# Patient Record
Sex: Male | Born: 1945 | Race: White | Hispanic: No | Marital: Married | Smoking: Former smoker
Health system: Southern US, Community
[De-identification: ages and names within clinical notes are randomized; demographics above are authoritative.]

## PROBLEM LIST (undated history)

## (undated) DIAGNOSIS — C4491 Basal cell carcinoma of skin, unspecified: Secondary | ICD-10-CM

## (undated) DIAGNOSIS — E039 Hypothyroidism, unspecified: Secondary | ICD-10-CM

## (undated) DIAGNOSIS — Z8739 Personal history of other diseases of the musculoskeletal system and connective tissue: Secondary | ICD-10-CM

## (undated) DIAGNOSIS — I219 Acute myocardial infarction, unspecified: Secondary | ICD-10-CM

## (undated) DIAGNOSIS — M310 Hypersensitivity angiitis: Secondary | ICD-10-CM

## (undated) DIAGNOSIS — I10 Essential (primary) hypertension: Secondary | ICD-10-CM

## (undated) DIAGNOSIS — E785 Hyperlipidemia, unspecified: Secondary | ICD-10-CM

## (undated) DIAGNOSIS — M545 Low back pain, unspecified: Secondary | ICD-10-CM

## (undated) DIAGNOSIS — E119 Type 2 diabetes mellitus without complications: Secondary | ICD-10-CM

## (undated) DIAGNOSIS — M48061 Spinal stenosis, lumbar region without neurogenic claudication: Secondary | ICD-10-CM

## (undated) DIAGNOSIS — F419 Anxiety disorder, unspecified: Secondary | ICD-10-CM

## (undated) DIAGNOSIS — I82409 Acute embolism and thrombosis of unspecified deep veins of unspecified lower extremity: Secondary | ICD-10-CM

## (undated) DIAGNOSIS — C140 Malignant neoplasm of pharynx, unspecified: Secondary | ICD-10-CM

## (undated) DIAGNOSIS — G4730 Sleep apnea, unspecified: Secondary | ICD-10-CM

## (undated) DIAGNOSIS — F32A Depression, unspecified: Secondary | ICD-10-CM

## (undated) DIAGNOSIS — R569 Unspecified convulsions: Secondary | ICD-10-CM

## (undated) DIAGNOSIS — R918 Other nonspecific abnormal finding of lung field: Secondary | ICD-10-CM

## (undated) DIAGNOSIS — M1990 Unspecified osteoarthritis, unspecified site: Secondary | ICD-10-CM

## (undated) DIAGNOSIS — J690 Pneumonitis due to inhalation of food and vomit: Secondary | ICD-10-CM

## (undated) DIAGNOSIS — F329 Major depressive disorder, single episode, unspecified: Secondary | ICD-10-CM

## (undated) DIAGNOSIS — I2510 Atherosclerotic heart disease of native coronary artery without angina pectoris: Secondary | ICD-10-CM

## (undated) DIAGNOSIS — Z9981 Dependence on supplemental oxygen: Secondary | ICD-10-CM

## (undated) DIAGNOSIS — D509 Iron deficiency anemia, unspecified: Secondary | ICD-10-CM

## (undated) DIAGNOSIS — G20 Parkinson's disease: Secondary | ICD-10-CM

## (undated) DIAGNOSIS — I509 Heart failure, unspecified: Secondary | ICD-10-CM

## (undated) DIAGNOSIS — G20A1 Parkinson's disease without dyskinesia, without mention of fluctuations: Secondary | ICD-10-CM

## (undated) DIAGNOSIS — K2270 Barrett's esophagus without dysplasia: Secondary | ICD-10-CM

## (undated) DIAGNOSIS — J449 Chronic obstructive pulmonary disease, unspecified: Secondary | ICD-10-CM

## (undated) DIAGNOSIS — G8929 Other chronic pain: Secondary | ICD-10-CM

## (undated) DIAGNOSIS — K219 Gastro-esophageal reflux disease without esophagitis: Secondary | ICD-10-CM

## (undated) HISTORY — PX: CORONARY ANGIOPLASTY WITH STENT PLACEMENT: SHX49

## (undated) HISTORY — DX: Major depressive disorder, single episode, unspecified: F32.9

## (undated) HISTORY — DX: Heart failure, unspecified: I50.9

## (undated) HISTORY — DX: Spinal stenosis, lumbar region without neurogenic claudication: M48.061

## (undated) HISTORY — PX: BACK SURGERY: SHX140

## (undated) HISTORY — DX: Chronic obstructive pulmonary disease, unspecified: J44.9

## (undated) HISTORY — DX: Barrett's esophagus without dysplasia: K22.70

## (undated) HISTORY — PX: ANTERIOR CERVICAL DECOMP/DISCECTOMY FUSION: SHX1161

## (undated) HISTORY — DX: Hyperlipidemia, unspecified: E78.5

## (undated) HISTORY — PX: JOINT REPLACEMENT: SHX530

## (undated) HISTORY — DX: Depression, unspecified: F32.A

## (undated) HISTORY — DX: Essential (primary) hypertension: I10

## (undated) HISTORY — DX: Atherosclerotic heart disease of native coronary artery without angina pectoris: I25.10

## (undated) HISTORY — PX: TONSILLECTOMY: SUR1361

## (undated) HISTORY — DX: Acute myocardial infarction, unspecified: I21.9

## (undated) HISTORY — DX: Other nonspecific abnormal finding of lung field: R91.8

## (undated) HISTORY — PX: CATARACT EXTRACTION W/ INTRAOCULAR LENS  IMPLANT, BILATERAL: SHX1307

## (undated) HISTORY — PX: BASAL CELL CARCINOMA EXCISION: SHX1214

## (undated) HISTORY — PX: EXCISIONAL HEMORRHOIDECTOMY: SHX1541

## (undated) HISTORY — PX: POSTERIOR FUSION CERVICAL SPINE: SUR628

## (undated) HISTORY — PX: RADICAL NECK DISSECTION: SHX2284

## (undated) HISTORY — DX: Basal cell carcinoma of skin, unspecified: C44.91

## (undated) HISTORY — PX: THYROIDECTOMY: SHX17

---

## 1994-07-07 HISTORY — PX: CORONARY ARTERY BYPASS GRAFT: SHX141

## 1995-03-20 DIAGNOSIS — I219 Acute myocardial infarction, unspecified: Secondary | ICD-10-CM

## 1995-03-20 HISTORY — DX: Acute myocardial infarction, unspecified: I21.9

## 2001-12-14 ENCOUNTER — Encounter: Payer: Self-pay | Admitting: Neurosurgery

## 2001-12-14 ENCOUNTER — Encounter: Admission: RE | Admit: 2001-12-14 | Discharge: 2001-12-14 | Payer: Self-pay | Admitting: Neurosurgery

## 2002-01-21 ENCOUNTER — Encounter: Payer: Self-pay | Admitting: Neurosurgery

## 2002-01-27 ENCOUNTER — Encounter: Payer: Self-pay | Admitting: Neurosurgery

## 2002-01-28 ENCOUNTER — Inpatient Hospital Stay (HOSPITAL_COMMUNITY): Admission: AD | Admit: 2002-01-28 | Discharge: 2002-02-01 | Payer: Self-pay | Admitting: Neurosurgery

## 2005-05-26 ENCOUNTER — Ambulatory Visit: Payer: Self-pay | Admitting: Internal Medicine

## 2005-12-30 ENCOUNTER — Ambulatory Visit: Payer: Self-pay | Admitting: Internal Medicine

## 2006-01-21 ENCOUNTER — Ambulatory Visit (HOSPITAL_COMMUNITY): Admission: RE | Admit: 2006-01-21 | Discharge: 2006-01-21 | Payer: Self-pay | Admitting: Neurosurgery

## 2006-02-05 ENCOUNTER — Ambulatory Visit (HOSPITAL_COMMUNITY): Admission: RE | Admit: 2006-02-05 | Discharge: 2006-02-06 | Payer: Self-pay | Admitting: Neurosurgery

## 2006-06-24 ENCOUNTER — Ambulatory Visit: Payer: Self-pay | Admitting: Otolaryngology

## 2006-09-02 ENCOUNTER — Ambulatory Visit: Payer: Self-pay | Admitting: Otolaryngology

## 2007-01-19 ENCOUNTER — Ambulatory Visit: Payer: Self-pay | Admitting: Internal Medicine

## 2007-01-20 ENCOUNTER — Ambulatory Visit: Payer: Self-pay | Admitting: Internal Medicine

## 2007-06-09 ENCOUNTER — Ambulatory Visit: Payer: Self-pay | Admitting: Internal Medicine

## 2007-06-16 ENCOUNTER — Ambulatory Visit: Payer: Self-pay | Admitting: Gastroenterology

## 2007-06-23 ENCOUNTER — Ambulatory Visit: Payer: Self-pay | Admitting: Gastroenterology

## 2007-06-28 ENCOUNTER — Ambulatory Visit: Payer: Self-pay | Admitting: Gastroenterology

## 2008-01-29 ENCOUNTER — Ambulatory Visit: Payer: Self-pay | Admitting: Internal Medicine

## 2009-05-06 ENCOUNTER — Inpatient Hospital Stay: Payer: Self-pay | Admitting: Internal Medicine

## 2009-07-07 HISTORY — PX: TOTAL KNEE ARTHROPLASTY: SHX125

## 2009-07-10 ENCOUNTER — Ambulatory Visit: Payer: Self-pay | Admitting: Ophthalmology

## 2009-12-24 ENCOUNTER — Ambulatory Visit: Payer: Self-pay | Admitting: General Practice

## 2009-12-26 ENCOUNTER — Ambulatory Visit: Payer: Self-pay | Admitting: Cardiovascular Disease

## 2010-01-09 ENCOUNTER — Inpatient Hospital Stay: Payer: Self-pay | Admitting: General Practice

## 2011-02-06 ENCOUNTER — Ambulatory Visit: Payer: Self-pay | Admitting: Otolaryngology

## 2011-02-19 ENCOUNTER — Ambulatory Visit: Payer: Self-pay | Admitting: Specialist

## 2011-11-26 ENCOUNTER — Ambulatory Visit: Payer: Self-pay | Admitting: Ophthalmology

## 2011-11-26 DIAGNOSIS — I10 Essential (primary) hypertension: Secondary | ICD-10-CM

## 2011-12-09 ENCOUNTER — Ambulatory Visit: Payer: Self-pay | Admitting: Ophthalmology

## 2012-01-01 ENCOUNTER — Ambulatory Visit: Payer: Self-pay | Admitting: Cardiothoracic Surgery

## 2012-01-02 ENCOUNTER — Telehealth: Payer: Self-pay | Admitting: Pulmonary Disease

## 2012-01-02 NOTE — Telephone Encounter (Signed)
Forward to Dr. Kendrick Fries for review on 01-02-12 ym

## 2012-01-05 ENCOUNTER — Ambulatory Visit: Payer: Self-pay | Admitting: Cardiothoracic Surgery

## 2012-01-14 ENCOUNTER — Encounter: Payer: Self-pay | Admitting: *Deleted

## 2012-01-14 ENCOUNTER — Ambulatory Visit: Payer: Self-pay | Admitting: Gastroenterology

## 2012-01-15 ENCOUNTER — Encounter: Payer: Self-pay | Admitting: Pulmonary Disease

## 2012-01-15 ENCOUNTER — Ambulatory Visit (INDEPENDENT_AMBULATORY_CARE_PROVIDER_SITE_OTHER): Payer: Medicare Other | Admitting: Pulmonary Disease

## 2012-01-15 VITALS — BP 162/90 | HR 110 | Temp 97.8°F | Ht 70.0 in | Wt 192.4 lb

## 2012-01-15 DIAGNOSIS — J449 Chronic obstructive pulmonary disease, unspecified: Secondary | ICD-10-CM

## 2012-01-15 DIAGNOSIS — J690 Pneumonitis due to inhalation of food and vomit: Secondary | ICD-10-CM | POA: Insufficient documentation

## 2012-01-15 DIAGNOSIS — K2270 Barrett's esophagus without dysplasia: Secondary | ICD-10-CM

## 2012-01-15 DIAGNOSIS — I2510 Atherosclerotic heart disease of native coronary artery without angina pectoris: Secondary | ICD-10-CM

## 2012-01-15 DIAGNOSIS — T17908A Unspecified foreign body in respiratory tract, part unspecified causing other injury, initial encounter: Secondary | ICD-10-CM

## 2012-01-15 DIAGNOSIS — R918 Other nonspecific abnormal finding of lung field: Secondary | ICD-10-CM

## 2012-01-15 DIAGNOSIS — J4489 Other specified chronic obstructive pulmonary disease: Secondary | ICD-10-CM

## 2012-01-15 DIAGNOSIS — I10 Essential (primary) hypertension: Secondary | ICD-10-CM

## 2012-01-15 MED ORDER — TIOTROPIUM BROMIDE MONOHYDRATE 18 MCG IN CAPS: 18.0000 ug | ORAL_CAPSULE | Freq: Every day | RESPIRATORY_TRACT | Status: DC

## 2012-01-15 NOTE — Progress Notes (Signed)
Subjective:    Patient ID: John Mendoza, male    DOB: [birth year], 66 y.o.   MRN: [id number]  HPI John Mendoza is a very pleasant 66 year old male with a past medical history significant for throat cancer and COPD who presented to our office today for evaluation of shortness of breath and pulmonary nodules. He smoked 2 packs per day for 35 years and quit in 1996. At that point he had a three-vessel coronary artery bypass surgery performed at [facility] In 1999 he developed throat cancer and underwent a radical neck dissection and 36 days of radiation therapy by the [facility]. Since his radiation therapy he has had trouble swallowing that has progressed over the years. 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 He is to start participating in speech therapy for swallowing exercises. He has had at least one episode of pneumonia over the years which she believes is due to aspiration. In terms of respiratory symptoms he says that he gets quite short of breath with minimal walking around his house. He states that sometimes just walking 20 or 30 feet will make him winded and he nearly always gets winded while gardening. He states that he gets short of breath when he bends over and he complains of cough productive of clear to yellow sputum on a daily basis. His cough is always worse after eating.  He sometimes has chest tightness which radiates to his jaw and left arm when he is short of breath. Of note he had a negative stress test performed by Dr. Lady Mendoza 3 weeks ago. He states that in the past he took inhalers (he believes albuterol and perhaps Advair) and feels that they caused depression symptoms.  Past Medical History  Diagnosis Date  . Barrett esophagus   . Hypertension   . Hyperlipidemia   . Depression   . Multiple pulmonary nodules   . CHF (congestive  heart failure)   . Lumbar spinal stenosis   . S/P CABG (coronary artery bypass graft)   . COPD (chronic obstructive pulmonary disease)   . CAD (coronary artery disease)   . Basal cell carcinoma   . Diabetes mellitus   . Cancer of neck   . Heart attack      Family History  Problem Relation Age of Onset  . Leukemia Paternal Grandmother   . Cancer Maternal Grandmother     stomach  . Cancer Paternal Aunt   . Other Father     bacterial endocarditis  . Rheum arthritis Father      History   Social History  . Marital Status: Married    Spouse Name: N/A    Number of Children: 2  . Years of Education: N/A   Occupational History  . Retired     has worked in [employer] in the past, exposed to lint.    Social History Main Topics  . Smoking status: Former Smoker -- 2.0 packs/day for 35 years    Types: Cigarettes    Quit date: 07/07/1994  . Smokeless tobacco: Never Used  . Alcohol Use: No  . Drug Use: No  . Sexually Active: Not on file   Other Topics Concern  . Not on file   Social History Narrative  . No narrative on file     Allergies  Allergen Reactions  . Other     Pt states that inhaled meds make  him depressed and have negative thoughts     Outpatient Prescriptions Prior to Visit  Medication Sig Dispense Refill  . amLODipine (NORVASC) 10 MG tablet Take 10 mg by mouth daily.      Marland Kitchen aspirin 325 MG tablet Take 325 mg by mouth daily.      . bisoprolol (ZEBETA) 10 MG tablet Take 10 mg by mouth daily.      Marland Kitchen CLONAZEPAM PO Take by mouth. 1.5 mg at night and 0.75mg  in the morning      . levothyroxine (SYNTHROID, LEVOTHROID) 125 MCG tablet Take 125 mcg by mouth daily.      Marland Kitchen lisinopril (PRINIVIL,ZESTRIL) 40 MG tablet Take 40 mg by mouth daily.      . metFORMIN (GLUCOPHAGE) 1000 MG tablet Take 1,000 mg by mouth daily.      Marland Kitchen omeprazole (PRILOSEC) 40 MG capsule Take 40 mg by mouth 2 (two) times daily.      Marland Kitchen PARoxetine (PAXIL) 20 MG tablet Take 20 mg by mouth 3 (three)  times daily.      . simvastatin (ZOCOR) 80 MG tablet Take 80 mg by mouth at bedtime.           Review of Systems  Constitutional: Positive for appetite change. Negative for fever, chills and activity change.  HENT: Positive for trouble swallowing. Negative for hearing loss, ear pain, congestion, rhinorrhea, sneezing, neck pain, neck stiffness, postnasal drip and sinus pressure.   Eyes: Negative for redness, itching and visual disturbance.  Respiratory: Positive for cough and shortness of breath. Negative for chest tightness and wheezing.   Cardiovascular: Positive for chest pain. Negative for palpitations and leg swelling.  Gastrointestinal: Positive for abdominal pain. Negative for nausea, vomiting, diarrhea, constipation, blood in stool and abdominal distention.  Musculoskeletal: Negative for myalgias, joint swelling, arthralgias and gait problem.  Skin: Negative for rash.  Neurological: Negative for dizziness, light-headedness, numbness and headaches.  Hematological: Does not bruise/bleed easily.  Psychiatric/Behavioral: Negative for confusion and dysphoric mood.       Objective:   Physical Exam Filed Vitals:   01/15/12 1450  BP: 162/90  Pulse: 110  Temp: 97.8 F (36.6 C)  TempSrc: Oral  Height: 5\' 10"  (1.778 m)  Weight: 192 lb 6.4 oz (87.272 kg)  SpO2: 96%   Gen: well appearing, no acute distress HEENT: NCAT, PERRL, EOMi, OP clear with dry mucus membranes, s/p radical neck dissection on right,  PULM: CTA B CV: RRR, no mgr, no JVD AB: BS+, soft, nontender, no hsm Ext: warm, trace edema, no clubbing, no cyanosis Derm: no rash or skin breakdown Neuro: A&Ox4, CN II-XII intact, strength 5/5 in all 4 extremities   01/15/2012 simple spirometry: Flow volume loop shows obstruction FEV1 to FVC ratio is 70% FEV1 is 1.82 L (51% predicted)  August 2012 CT chest [facility]: (My read) Left basilar groundglass opacity most consistent with an infectious process scattered nodules all less  than 1 cm throughout both lobes. Emphysema noted some tree in bed opacities noted.  June 2013 CT chest [facility] colon (my read) the left basilar groundglass opacity seen on the August 2012 study is no longer present. There is emphysema noted throughout and the the quality of the study on my computer is limited I appreciate tree in bud opacities in the bases bilaterally. There are also multiple scattered nodules in both lungs. There is one pleural-based nodule in the left lung which unfortunately I am unable to see completely which I measure at 1.6 cm.  Assessment & Plan:   Multiple pulmonary nodules Mr. Pandya has multiple nodules seen him in June 2013 CT chest some of which appear to have increased slightly in size since the August 2012 study. There are many other findings on the CT scan such as tree in bed opacities in the bases which are highly suggestive of aspiration. Based on his barium swallow yesterday he clearly has aspiration as well.  I think it is highly likely that the scattered nodules are likely due to aspiration versus MAI disease. The tree in bed opacities and waxing and waning nodules with sputum production and prior smoker with known COPD do raise concern for MAI.  There is no particular nodule which raises concern to me for biopsy but I will obtain the full report from [facility] of the CT scan.  Plan: -Obtain sputum culture x3 for AFB -Obtain records from [facility] for the full report of the CT scan from June 2013 -Repeat CT scan without contrast in June 2013 at [facility] and have the results in the images forted to my office and to Dr. Thelma Barge office  COPD (chronic obstructive pulmonary disease) COPD: GOLD Grade B Combined recommendations from the [employer], [employer]icians, Designer, television/film set, European Respiratory Society (Qaseem A et al, Ann Intern Med. 2011;155(3):179) recommends tobacco cessation,  pulmonary rehab (for symptomatic patients with an FEV1 < 50% predicted), supplemental oxygen (for patients with SaO2 <88% or paO2 <55), and appropriate bronchodilator therapy.  In regards to long acting bronchodilators, they recommend monotherapy (FEV1 60-80% with symptoms weak evidence, FEV1 with symptoms <60% strong evidence), or combination therapy (FEV1 <60% with symptoms, strong recommendation, moderate evidence).  One should also provide patients with annual immunizations and consider therapy for prevention of COPD exacerbations (ie. roflumilast or azithromycin) when appopriate.  -O2 therapy: not indicated -Immunizations: up to date, instructed to get annual flu shot -Tobacco use: 70-pack-year smoking history quit in 1996 -Exercise: _Referred to pulmonary rehabilitation at [place] -Bronchodilator therapy: Start Spiriva -Exacerbation prevention: Not applicable   Aspiration Into Respiratory Tract This is do to try her radiation therapy. He is to continue participating in speech therapy and swallowing exercises at [facility]. We recommended that he use by a team rinse for dry mouth.   Updated Medication List Outpatient Encounter Prescriptions as of 01/15/2012  Medication Sig Dispense Refill  . amLODipine (NORVASC) 10 MG tablet Take 10 mg by mouth daily.      Marland Kitchen aspirin 325 MG tablet Take 325 mg by mouth daily.      . bisoprolol (ZEBETA) 10 MG tablet Take 10 mg by mouth daily.      Marland Kitchen CLONAZEPAM PO Take by mouth. 1.5 mg at night and 0.75mg  in the morning      . levothyroxine (SYNTHROID, LEVOTHROID) 125 MCG tablet Take 125 mcg by mouth daily.      Marland Kitchen lisinopril (PRINIVIL,ZESTRIL) 40 MG tablet Take 40 mg by mouth daily.      . metFORMIN (GLUCOPHAGE) 1000 MG tablet Take 1,000 mg by mouth daily.      Marland Kitchen omeprazole (PRILOSEC) 40 MG capsule Take 40 mg by mouth 2 (two) times daily.      Marland Kitchen PARoxetine (PAXIL) 20 MG tablet Take 20 mg by mouth 3 (three) times daily.      . simvastatin (ZOCOR) 80 MG tablet  Take 80 mg by mouth at bedtime.      Marland Kitchen tiotropium (SPIRIVA HANDIHALER) 18 MCG inhalation capsule Place 1 capsule (  18 mcg total) into inhaler and inhale daily.  30 capsule  2

## 2012-01-15 NOTE — Assessment & Plan Note (Addendum)
COPD: GOLD Grade B Combined recommendations from the [employer]	[place], [employer], Designer, television/film set, European Respiratory Society (Qaseem A et al, Ann Intern Med. 2011;155(3):179) recommends tobacco cessation, pulmonary rehab (for symptomatic patients with an FEV1 < 50% predicted), supplemental oxygen (for patients with SaO2 <88% or paO2 <55), and appropriate bronchodilator therapy.  In regards to long acting bronchodilators, they recommend monotherapy (FEV1 60-80% with symptoms weak evidence, FEV1 with symptoms <60% strong evidence), or combination therapy (FEV1 <60% with symptoms, strong recommendation, moderate evidence).  One should also provide patients with annual immunizations and consider therapy for prevention of COPD exacerbations (ie. roflumilast or azithromycin) when appopriate.  -O2 therapy: not indicated -Immunizations: up to date, instructed to get annual flu shot -Tobacco use: 70-pack-year smoking history quit in 1996 -Exercise: _Referred to pulmonary rehabilitation at [place] -Bronchodilator therapy: Start Spiriva -Exacerbation prevention: Not applicable

## 2012-01-15 NOTE — Assessment & Plan Note (Signed)
This is do to try her radiation therapy. He is to continue participating in speech therapy and swallowing exercises at [facility]. We recommended that he use by a team rinse for dry mouth.

## 2012-01-15 NOTE — Patient Instructions (Addendum)
We will refer you to pulmonary rehabilitation at [facility] taking Spiriva once daily Get a flu shot every year We will send you for a CT scan without contrast in 3 months at [facility] to evaluate for pulmonary nodules We will provide you with 3 cups to collect phlegm. Cough for at least a tablespoon of phlegm into a cup and bring it into her office for lab work. Repeat this process for 3 days. Use Bioteen mouthwash for dry mouth Continue followup with speech therapy for swallowing therapy and exercises We will see you back in 3-4 months or sooner if needed

## 2012-01-15 NOTE — Assessment & Plan Note (Signed)
John Mendoza has multiple nodules seen him in June 2013 CT chest some of which appear to have increased slightly in size since the August 2012 study. There are many other findings on the CT scan such as tree in bed opacities in the bases which are highly suggestive of aspiration. Based on his barium swallow yesterday he clearly has aspiration as well.  I think it is highly likely that the scattered nodules are likely due to aspiration versus MAI disease. The tree in bed opacities and waxing and waning nodules with sputum production and prior smoker with known COPD do raise concern for MAI.  There is no particular nodule which raises concern to me for biopsy but I will obtain the full report from [facility] of the CT scan.  Plan: -Obtain sputum culture x3 for AFB -Obtain records from [facility] for the full report of the CT scan from June 2013 -Repeat CT scan without contrast in June 2013 at [facility] and have the results in the images forted to my office and to Dr. Thelma Barge office

## 2012-01-22 ENCOUNTER — Ambulatory Visit: Payer: Self-pay | Admitting: Gastroenterology

## 2012-01-26 LAB — PATHOLOGY REPORT

## 2012-02-10 ENCOUNTER — Institutional Professional Consult (permissible substitution): Payer: Medicare Other | Admitting: Pulmonary Disease

## 2012-02-25 ENCOUNTER — Telehealth: Payer: Self-pay | Admitting: Pulmonary Disease

## 2012-02-25 NOTE — Telephone Encounter (Signed)
Call report from [facility]- sputum pos for AFB MAC Will forward to BQ to make him aware.

## 2012-03-01 NOTE — Telephone Encounter (Signed)
Noted, will look for final report

## 2012-03-02 NOTE — Progress Notes (Signed)
Quick Note:  Called and spoke with Corwin at [facility]. He states that only one of the three of the cultures was pos. He is faxing me results and I will have Dr. Kendrick Fries sign and scan nect wk ______

## 2012-03-10 ENCOUNTER — Encounter: Payer: Self-pay | Admitting: Pulmonary Disease

## 2012-03-10 DIAGNOSIS — A310 Pulmonary mycobacterial infection: Secondary | ICD-10-CM | POA: Insufficient documentation

## 2012-03-22 ENCOUNTER — Encounter: Payer: Self-pay | Admitting: Pulmonary Disease

## 2012-03-29 LAB — AFB CULTURE WITH SMEAR (NOT AT ARMC): Acid Fast Smear: NONE SEEN

## 2012-04-12 ENCOUNTER — Telehealth: Payer: Self-pay | Admitting: Pulmonary Disease

## 2012-04-12 NOTE — Telephone Encounter (Signed)
Order printed, stamped with BQ signature, and faxed to [facility]; I attempted to call Heather but office closed. Will sign off on message and if Herbert Seta did not get order she will call back.

## 2012-05-03 ENCOUNTER — Telehealth: Payer: Self-pay | Admitting: *Deleted

## 2012-05-03 ENCOUNTER — Encounter: Payer: Self-pay | Admitting: Pulmonary Disease

## 2012-05-03 NOTE — Telephone Encounter (Signed)
Message copied by Christen Butter on Mon May 03, 2012  1:15 PM ------      Message from: Lupita Leash      Created: Mon May 03, 2012 11:31 AM       L,            Can you let him know that his CT scan showed no change from the previous (which is good).            Thanks,      B

## 2012-05-03 NOTE — Telephone Encounter (Signed)
Spoke with pt and notified of results per Dr. Kendrick Fries. Pt verbalized understanding and states no questions/concerns.

## 2012-05-07 ENCOUNTER — Ambulatory Visit (INDEPENDENT_AMBULATORY_CARE_PROVIDER_SITE_OTHER): Payer: Medicare Other | Admitting: Pulmonary Disease

## 2012-05-07 ENCOUNTER — Encounter: Payer: Self-pay | Admitting: Pulmonary Disease

## 2012-05-07 VITALS — BP 118/80 | HR 60 | Temp 98.3°F | Ht 70.0 in | Wt 199.0 lb

## 2012-05-07 DIAGNOSIS — J449 Chronic obstructive pulmonary disease, unspecified: Secondary | ICD-10-CM

## 2012-05-07 DIAGNOSIS — T17908A Unspecified foreign body in respiratory tract, part unspecified causing other injury, initial encounter: Secondary | ICD-10-CM

## 2012-05-07 DIAGNOSIS — R05 Cough: Secondary | ICD-10-CM

## 2012-05-07 DIAGNOSIS — R0989 Other specified symptoms and signs involving the circulatory and respiratory systems: Secondary | ICD-10-CM

## 2012-05-07 DIAGNOSIS — A310 Pulmonary mycobacterial infection: Secondary | ICD-10-CM

## 2012-05-07 DIAGNOSIS — R0600 Dyspnea, unspecified: Secondary | ICD-10-CM

## 2012-05-07 DIAGNOSIS — R918 Other nonspecific abnormal finding of lung field: Secondary | ICD-10-CM

## 2012-05-07 MED ORDER — IPRATROPIUM-ALBUTEROL 0.5-2.5 (3) MG/3ML IN SOLN: 3.0000 mL | Freq: Four times a day (QID) | RESPIRATORY_TRACT | Status: DC

## 2012-05-07 MED ORDER — LOSARTAN POTASSIUM 50 MG PO TABS: 50.0000 mg | ORAL_TABLET | Freq: Every day | ORAL | Status: DC

## 2012-05-07 NOTE — Assessment & Plan Note (Signed)
With only one culture positive in 3 and a lack of fever, chills, weight loss there is no reason to treat at this time. This is, I believe, the cause of his pulmonary nodules.

## 2012-05-07 NOTE — Assessment & Plan Note (Signed)
I believe that John Mendoza pulmonary nodules are do to MAI versus aspiration. We will have to send him for a repeat CT in 12 months as recommended by the radiologist. This will need to take place in August of 2014.

## 2012-05-07 NOTE — Patient Instructions (Signed)
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  One you get the Spiriva, stop the duoneb  Keep using Biotene rinse  We will see you back in 3 months or sooner if needed.  If you develop increasing fever, chills, cough, phlegm production let us know before the next visit so we can have you give [place] 3 more sputum specimens.

## 2012-05-07 NOTE — Progress Notes (Signed)
Subjective:    Patient ID: John Mendoza, male    DOB: 1945-12-29, 66 y.o.   MRN: [id number]  Synopsis: John Mendoza first saw the  pulmonary clinic in July 2013 for evaluation of shortness of breath. He smoked 2 packs of cigarettes daily for 35 years and quit in 1996 when he had a three-vessel coronary artery bypass graft. In 1999 he developed throat cancer and underwent a radical neck dissection and 36 days of radiation therapy at the [facility]. In 2013 he developed worsening cough and shortness of breath and had a barium swallow performed at [facility] showing pharyngeal dysphasia.  HPI  05/07/2012 ROV -- Since her last visit John Mendoza says his shortness of breath is getting worse. He never took the Spiriva and never called me to tell me this. He says that he didn't want pay the co-pay. He is coughing up food on a fairly regular basis though he cannot feel any sensation of choking on food or aspiration at the time that he's eating. He says at night he coughs up yellow-looking phlegm. He denies overt fevers that he says he does have chills quite often. He does not have night sweats and his weight has been stable. He denies chest pain but says that the dyspnea is significant enough that his cardiologist is considering repeating left heart catheterization.  Past Medical History  Diagnosis Date  . Barrett esophagus   . Hypertension   . Hyperlipidemia   . Depression   . Multiple pulmonary nodules   . CHF (congestive heart failure)   . Lumbar spinal stenosis   . S/P CABG (coronary artery bypass graft)   . COPD (chronic obstructive pulmonary disease)   . CAD (coronary artery disease)   . Basal cell carcinoma   . Diabetes mellitus   . Cancer of neck   . Heart attack       Review of Systems     Objective:   Physical Exam  Filed Vitals:   05/07/12 1423  BP: 118/80  Pulse: 60  Temp: 98.3 F (36.8 C)  TempSrc: Oral  Height: 5\' 10"  (1.778 m)  Weight: 199 lb (90.266 kg)    SpO2: 96%   Gen: well appearing, no acute distress HEENT: NCAT, PERRL, s/p radical neck dissection, OP dry PULM: CTA R, Insp crackles throughout L CV: RRR, no mgr, no JVD AB: BS+, soft, nontender, no hsm Ext: warm, no edema, no clubbing, no cyanosis  01/15/2012 simple spirometry: Flow volume loop shows obstruction FEV1 to FVC ratio is 70% FEV1 is 1.82 L (51% predicted)  August 2012 CT chest [facility]: (My read) Left basilar groundglass opacity most consistent with an infectious process scattered nodules all less than 1 cm throughout both lobes. Emphysema noted some tree in bed opacities noted.  June 2013 CT chest [facility] colon (my read) the left basilar groundglass opacity seen on the August 2012 study is no longer present. There is emphysema noted throughout and the the quality of the study on my computer is limited I appreciate tree in bud opacities in the bases bilaterally. There are also multiple scattered nodules in both lungs. There is one pleural-based nodule in the left lung which unfortunately I am unable to see completely which I measure at 1.6 cm.      Assessment & Plan:   COPD (chronic obstructive pulmonary disease) John Mendoza has Grade C COPD. his COPD is made worse by the fact that he has chronic aspiration. I think he  would do better if he was on drug. I'm disappointed that he did not call me when he decided to stop taking the Spiriva. I think he will do well with exercise and regular drug therapy. He does not want to have to pay for the Spiriva because the co-pay is $150 a month.  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 This unfortunately is becoming a major problem for him and his driving a lot of his cough as noted on his chest  CT.  Plan: -I've asked him to followup with John Mendoza again for this to see if there is anything else we can add for his aspiration Plan he is to continue using the swallowing exercises and precautions advised by speech therapy at [facility].   MAI (mycobacterium avium-intracellulare) With only one culture positive in 3 and a lack of fever, chills, weight loss there is no reason to treat at this time. This is, I believe, the cause of his pulmonary nodules.  Multiple pulmonary nodules I believe that John Mendoza pulmonary nodules are do to MAI versus aspiration. We will have to send him for a repeat CT in 12 months as recommended by the radiologist. This will need to take place in August of 2014.   Updated Medication List Outpatient Encounter Prescriptions as of 05/07/2012  Medication Sig Dispense Refill  . amLODipine (NORVASC) 10 MG tablet Take 10 mg by mouth daily.      Marland Kitchen aspirin 325 MG tablet Take 325 mg by mouth daily.      . bisoprolol (ZEBETA) 10 MG tablet Take 10 mg by mouth daily.      Marland Kitchen CLONAZEPAM PO Take by mouth. 1.5 mg at night and 0.75mg  in the morning      . levothyroxine (SYNTHROID, LEVOTHROID) 125 MCG tablet Take 125 mcg by mouth daily.      . metFORMIN (GLUCOPHAGE) 1000 MG tablet Take 1,000 mg by mouth daily.      Marland Kitchen omeprazole (PRILOSEC) 40 MG capsule Take 40 mg by mouth 2 (two) times daily.      Marland Kitchen PARoxetine (PAXIL) 20 MG tablet Take 20 mg by mouth 3 (three) times daily.      . simvastatin (ZOCOR) 80 MG tablet Take 80 mg by mouth at bedtime.      Marland Kitchen DISCONTD: lisinopril (PRINIVIL,ZESTRIL) 40 MG tablet Take 40 mg by mouth daily.      Marland Kitchen ipratropium-albuterol (DUONEB) 0.5-2.5 (3) MG/3ML SOLN Take 3 mLs by nebulization 4 (four) times daily.  360 mL  5  . losartan (COZAAR) 50 MG tablet Take 1 tablet (50 mg total) by mouth daily.  30 tablet  11  . DISCONTD: tiotropium (SPIRIVA HANDIHALER) 18 MCG inhalation capsule Place 1 capsule (18 mcg total) into inhaler and inhale daily.  30  capsule  2

## 2012-05-07 NOTE — Assessment & Plan Note (Signed)
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 This unfortunately is becoming a major problem for him and his driving a lot of his cough as noted on his chest CT.  Plan: -I've asked him to followup with Dr. Andee Poles again for this to see if there is anything else we can add for his aspiration Plan he is to continue using the swallowing exercises and precautions advised by speech therapy at [facility].

## 2012-05-07 NOTE — Assessment & Plan Note (Signed)
John Mendoza has Grade C COPD. his COPD is made worse by the fact that he has chronic aspiration. I think he would do better if he was on drug. I'm disappointed that he did not call me when he decided to stop taking the Spiriva. I think he will do well with exercise and regular drug therapy. He does not want to have to pay for the Spiriva because the co-pay is $150 a month.  Plan: -Start nebulized duo neb therapy 4 times a day -Obtain Spriva through your [facility] -Continue regular exercise -Flu shot was given several months ago

## 2012-08-03 ENCOUNTER — Telehealth: Payer: Self-pay | Admitting: Pulmonary Disease

## 2012-08-03 ENCOUNTER — Ambulatory Visit (INDEPENDENT_AMBULATORY_CARE_PROVIDER_SITE_OTHER): Payer: Medicare Other | Admitting: Pulmonary Disease

## 2012-08-03 ENCOUNTER — Encounter: Payer: Self-pay | Admitting: Pulmonary Disease

## 2012-08-03 VITALS — BP 142/80 | HR 64 | Temp 97.5°F | Ht 70.0 in | Wt 203.0 lb

## 2012-08-03 DIAGNOSIS — R918 Other nonspecific abnormal finding of lung field: Secondary | ICD-10-CM

## 2012-08-03 DIAGNOSIS — J449 Chronic obstructive pulmonary disease, unspecified: Secondary | ICD-10-CM

## 2012-08-03 DIAGNOSIS — A310 Pulmonary mycobacterial infection: Secondary | ICD-10-CM

## 2012-08-03 MED ORDER — BUDESONIDE 0.25 MG/2ML IN SUSP: 0.2500 mg | Freq: Two times a day (BID) | RESPIRATORY_TRACT | Status: DC

## 2012-08-03 MED ORDER — IPRATROPIUM-ALBUTEROL 0.5-2.5 (3) MG/3ML IN SOLN: 3.0000 mL | Freq: Four times a day (QID) | RESPIRATORY_TRACT | Status: DC

## 2012-08-03 MED ORDER — FORMOTEROL FUMARATE 20 MCG/2ML IN NEBU: 20.0000 ug | INHALATION_SOLUTION | Freq: Two times a day (BID) | RESPIRATORY_TRACT | Status: DC

## 2012-08-03 NOTE — Telephone Encounter (Signed)
Called spoke with patient, verified that he is using [facility] for dme company.   Per yesterday's ov note: Patient Instructions     Start taking the perforomist and pulmicort twice a day and use the duoneb as needed  We will see you back in 6 months or sooner if needed  LEt us know if you develop fever, chills, weight loss    As pt already uses duoneb thru [facility], will forward to BQ and Lillia Abed to order the neb meds and fax to [facility] @ [phone].  Thanks! **NOTE: reminder to place the diagnosis code in the actual prescription.

## 2012-08-03 NOTE — Assessment & Plan Note (Signed)
Mr. Ganaway has been doing about the same since our last visit.  He was unable to get Spriva from the [place]. He still is not capable of paying for it on a monthly basis. He states that he thinks he can get nebulized medications covered under Medicare through his home health provider.  Plan: -Start formoterol and Pulmicort nebulized twice a day -Continue albuterol -Annual flu shot

## 2012-08-03 NOTE — Telephone Encounter (Signed)
These prescriptions will be faxed to [facility].

## 2012-08-03 NOTE — Assessment & Plan Note (Signed)
As noted above, likely related to MAI.  Plan: -August 2014 chest CT

## 2012-08-03 NOTE — Patient Instructions (Addendum)
Start taking the perforomist and pulmicort twice a day and use the duoneb as needed  We will see you back in 6 months or sooner if needed LEt us know if you develop fever, chills, weight loss

## 2012-08-03 NOTE — Progress Notes (Signed)
Subjective:    Patient ID: John Mendoza, male    DOB: 08-02-1945, 67 y.o.   MRN: [id number]  Synopsis: John Mendoza first saw the [place] [facility] in July 2013 for evaluation of shortness of breath. He smoked 2 packs of cigarettes daily for 35 years and quit in 1996 when he had a three-vessel coronary artery bypass graft. In 1999 he developed throat cancer and underwent a radical neck dissection and 36 days of radiation therapy at the [facility]. In 2013 he developed worsening cough and shortness of breath and had a barium swallow performed at [facility] showing pharyngeal dysphasia.  HPI  05/07/2012 ROV -- Since her last visit John Mendoza says his shortness of breath is getting worse. He never took the Spiriva and never called me to tell me this. He says that he didn't want pay the co-pay. He is coughing up food on a fairly regular basis though he cannot feel any sensation of choking on food or aspiration at the time that he's eating. He says at night he coughs up yellow-looking phlegm. He denies overt fevers that he says he does have chills quite often. He does not have night sweats and his weight has been stable. He denies chest pain but says that the dyspnea is significant enough that his cardiologist is considering repeating left heart catheterization.  08/03/2012 ROV -- Since our last visit John Mendoza still has not obtained Spiriva or used it. He tried to get the drug from the [place] but did not pass the means test. He states that his shortness of breath, cough, and intermittent wheezing has not worsened. He occasionally produces sputum which is creamy in color. He saw Dr. Andee Poles who stated that there was really nothing that could be done to prevent aspiration short of a laryngectomy and tracheostomy.  He uses albuterol at least twice a day and it helps with shortness of breath but sometimes make him more shaky.   Past Medical History  Diagnosis Date  . Barrett esophagus   . Hypertension    . Hyperlipidemia   . Depression   . Multiple pulmonary nodules   . CHF (congestive heart failure)   . Lumbar spinal stenosis   . S/P CABG (coronary artery bypass graft)   . COPD (chronic obstructive pulmonary disease)   . CAD (coronary artery disease)   . Basal cell carcinoma   . Diabetes mellitus   . Cancer of neck   . Heart attack      Review of Systems  Constitutional: Negative for chills and fatigue.  HENT: Negative for nosebleeds, congestion and postnasal drip.   Respiratory: Positive for cough.        Objective:   Physical Exam   Filed Vitals:   08/03/12 1008  BP: 142/80  Pulse: 64  Temp: 97.5 F (36.4 C)  TempSrc: Oral  Height: 5\' 10"  (1.778 m)  Weight: 203 lb (92.08 kg)  SpO2: 98%   Gen: well appearing, no acute distress HEENT: NCAT, PERRL, s/p radical neck dissection, OP dry PULM: CTA R, Insp crackles throughout L CV: RRR, no mgr, no JVD AB: BS+, soft, nontender, no hsm Ext: warm, no edema, no clubbing, no cyanosis  01/15/2012 simple spirometry: Flow volume loop shows obstruction FEV1 to FVC ratio is 70% FEV1 is 1.82 L (51% predicted)  August 2012 CT chest [facility]: (My read) Left basilar groundglass opacity most consistent with an infectious process scattered nodules all less than 1 cm throughout both lobes. Emphysema  noted some tree in bed opacities noted.  June 2013 CT chest [facility] colon (my read) the left basilar groundglass opacity seen on the August 2012 study is no longer present. There is emphysema noted throughout and the the quality of the study on my computer is limited I appreciate tree in bud opacities in the bases bilaterally. There are also multiple scattered nodules in both lungs. There is one pleural-based nodule in the left lung which unfortunately I am unable to see completely which I measure at 1.6 cm.      Assessment & Plan:   COPD (chronic obstructive pulmonary disease) John Mendoza has been doing about the same since our last  visit.  He was unable to get Spriva from the [place]. He still is not capable of paying for it on a monthly basis. He states that he thinks he can get nebulized medications covered under Medicare through his home health provider.  Plan: -Start formoterol and Pulmicort nebulized twice a day -Continue albuterol -Annual flu shot  MAI (mycobacterium avium-intracellulare) Again, I think this is the likely cause of his pulmonary nodules. Given his lack of increased sputum production, fevers, chills, or weight loss I do not think that he has active MAI.  Plan: -Should he develop any of the above-mentioned symptoms we will re-collect sputum to look for MAI.  Multiple pulmonary nodules As noted above, likely related to MAI.  Plan: -August 2014 chest CT    Updated Medication List Outpatient Encounter Prescriptions as of 08/03/2012  Medication Sig Dispense Refill  . amLODipine (NORVASC) 10 MG tablet Take 10 mg by mouth daily.      Marland Kitchen aspirin 325 MG tablet Take 325 mg by mouth daily.      . bisoprolol (ZEBETA) 10 MG tablet Take 10 mg by mouth daily.      Marland Kitchen CLONAZEPAM PO Take by mouth. 1.5 mg at night and 0.75mg  in the morning      . ipratropium-albuterol (DUONEB) 0.5-2.5 (3) MG/3ML SOLN Take 3 mLs by nebulization 4 (four) times daily.  360 mL  5  . levothyroxine (SYNTHROID, LEVOTHROID) 125 MCG tablet Take 125 mcg by mouth daily.      Marland Kitchen losartan (COZAAR) 50 MG tablet Take 1 tablet (50 mg total) by mouth daily.  30 tablet  11  . metFORMIN (GLUCOPHAGE) 1000 MG tablet Take 1,000 mg by mouth daily.      Marland Kitchen omeprazole (PRILOSEC) 40 MG capsule Take 40 mg by mouth 2 (two) times daily.      Marland Kitchen PARoxetine (PAXIL) 20 MG tablet Take 20 mg by mouth 3 (three) times daily.      . simvastatin (ZOCOR) 80 MG tablet Take 80 mg by mouth at bedtime.

## 2012-08-03 NOTE — Assessment & Plan Note (Signed)
Again, I think this is the likely cause of his pulmonary nodules. Given his lack of increased sputum production, fevers, chills, or weight loss I do not think that he has active MAI.  Plan: -Should he develop any of the above-mentioned symptoms we will re-collect sputum to look for MAI.

## 2012-09-07 ENCOUNTER — Telehealth: Payer: Self-pay | Admitting: Pulmonary Disease

## 2012-09-07 NOTE — Telephone Encounter (Signed)
Called and spoke with pt and he wanted to discuss his neb meds.  He stated that he has been taking all of these meds  And wanted to make sure that he was taking them correctly.  Pt is aware that he has been taking these meds correctly and will cont the same. Nothing further is needed.

## 2012-09-17 ENCOUNTER — Ambulatory Visit: Payer: Self-pay | Admitting: Cardiology

## 2012-11-05 ENCOUNTER — Ambulatory Visit: Payer: Self-pay | Admitting: Cardiology

## 2012-11-18 ENCOUNTER — Telehealth: Payer: Self-pay | Admitting: Pulmonary Disease

## 2012-11-18 NOTE — Telephone Encounter (Signed)
I spoke with the pt and she states he had a CT done on 11/15/12 and it showed multiple new nodules in LLL ranging from 3-4 mm and also increase is 2 nodules  As well. Pt states Dawn From GI at [facility] ordered this so I called them and requested them to fax over report for Dr. Kendrick Fries  to review. Will await fax and then will forward message to BQ. Carron Curie, CMA

## 2012-11-19 NOTE — Telephone Encounter (Signed)
Results received and given to leslie to place with BQ paperwork. Message sent to Dr. Kendrick Fries. Carron Curie, CMA

## 2012-11-19 NOTE — Telephone Encounter (Signed)
K, will review on Tuesday when I am in clinic

## 2012-11-23 ENCOUNTER — Telehealth: Payer: Self-pay | Admitting: Pulmonary Disease

## 2012-11-23 ENCOUNTER — Encounter: Payer: Self-pay | Admitting: Pulmonary Disease

## 2012-11-23 NOTE — Telephone Encounter (Signed)
Duplicate msg.

## 2012-11-23 NOTE — Telephone Encounter (Signed)
Received 2 pages, sent to Dr. Kendrick Fries. 11/23/12/ss

## 2012-11-23 NOTE — Telephone Encounter (Signed)
Results in Dr Ulyses Jarred lookat to review

## 2012-11-23 NOTE — Telephone Encounter (Signed)
Please let him know that the CT showed changes consistent with aspiration, but nothing worrisome for cancer

## 2012-11-24 ENCOUNTER — Ambulatory Visit: Payer: Self-pay | Admitting: Gastroenterology

## 2012-11-24 NOTE — Telephone Encounter (Signed)
Spoke with pt and notified of recs per Dr. Kendrick Fries He verbalized understanding and states nothing further needed

## 2012-11-25 LAB — PATHOLOGY REPORT

## 2012-11-26 ENCOUNTER — Ambulatory Visit: Payer: Self-pay | Admitting: Cardiology

## 2012-12-31 ENCOUNTER — Other Ambulatory Visit: Payer: Self-pay | Admitting: Internal Medicine

## 2013-01-06 LAB — CULTURE, BLOOD (SINGLE)

## 2013-01-13 ENCOUNTER — Telehealth: Payer: Self-pay | Admitting: Pulmonary Disease

## 2013-01-13 MED ORDER — IPRATROPIUM-ALBUTEROL 0.5-2.5 (3) MG/3ML IN SOLN: 3.0000 mL | Freq: Every day | RESPIRATORY_TRACT | Status: DC | PRN

## 2013-01-13 NOTE — Telephone Encounter (Signed)
Ok to refill as requested 

## 2013-01-13 NOTE — Telephone Encounter (Signed)
Script printed and signed and faxed to pharmacy. Carron Curie, CMA

## 2013-01-13 NOTE — Telephone Encounter (Signed)
Dr. Corey Skains recs from last visit are as follows: Start taking the perforomist and pulmicort twice a day and use the duoneb as needed So [facility] and advised that it was fine to fill for only once a day since that is all insurance will cover and since MD instructions were for as needed use. They state they need a new RX for this in order to ship to the pt. Dr. Kendrick Fries is off until 01-31-13, so I will ask doc-of-day if he is ok to sign a one month supply for the pt so he will not have to go without meds until BQ return. Please advise. John Mendoza, CMA Allergies  Allergen Reactions  . Other     Pt states that inhaled meds make him depressed and have negative thoughts

## 2013-01-14 ENCOUNTER — Telehealth: Payer: Self-pay | Admitting: Pulmonary Disease

## 2013-01-14 NOTE — Telephone Encounter (Signed)
Called and spoke with steve at [facility] and he stated that they need rx for   Budesonide 0.5  1 bid via nebulizer duoneb 1 daily  Fax these rx to the [phone]  They also need the last ov note faxed along with this phone note that documents the change in the budesonide from 0.25 to the 0.5 if this is ok with Dr. Kendrick Fries.  Will forward this message to leslie to have her check with BQ to make sure this change in the medication is ok.

## 2013-01-17 MED ORDER — BUDESONIDE 0.5 MG/2ML IN SUSP: 0.5000 mg | Freq: Two times a day (BID) | RESPIRATORY_TRACT | Status: DC

## 2013-01-17 MED ORDER — IPRATROPIUM-ALBUTEROL 0.5-2.5 (3) MG/3ML IN SOLN: 3.0000 mL | Freq: Every day | RESPIRATORY_TRACT | Status: DC

## 2013-01-17 NOTE — Telephone Encounter (Signed)
Dr. Kendrick Fries, please advise if the below med change is okay Budesonide 0.5 bid and duoneb 1 daily

## 2013-01-17 NOTE — Telephone Encounter (Signed)
Yes, fine by me

## 2013-01-17 NOTE — Telephone Encounter (Signed)
Rx printed along with last ov note and this phone note Given to Lillia Abed to take to Dr Kendrick Fries to have him sign and fax tomorrow

## 2013-01-18 ENCOUNTER — Telehealth: Payer: Self-pay | Admitting: Pulmonary Disease

## 2013-01-18 NOTE — Telephone Encounter (Signed)
Rx clarification needed for budesonide and duoneb Per telephone note 01/14/13: Lupita Leash, MD at 01/17/2013 1:49 PM   Status: Signed            Yes, fine by me        Christen Butter, CMA at 01/17/2013 8:31 AM    Status: Signed             Dr. Kendrick Fries, please advise if the below med change is okay  Budesonide 0.5 bid and duoneb 1 daily    Clarification given and nothing further needed at this time

## 2013-01-18 NOTE — Telephone Encounter (Signed)
Rx has been signed by BQ. This will be faxed to [facility] today. Nothing further is needed at this time.

## 2013-01-24 ENCOUNTER — Telehealth: Payer: Self-pay | Admitting: Pulmonary Disease

## 2013-01-24 NOTE — Telephone Encounter (Signed)
Pt c/o sob and neb meds not working--scheduled pt to see RB Wednesday 7/23 at 10:30--pt aware of appt

## 2013-01-26 ENCOUNTER — Ambulatory Visit (INDEPENDENT_AMBULATORY_CARE_PROVIDER_SITE_OTHER): Payer: Medicare Other | Admitting: Emergency Medicine

## 2013-01-26 ENCOUNTER — Encounter: Payer: Self-pay | Admitting: Emergency Medicine

## 2013-01-26 VITALS — BP 130/78 | HR 59 | Temp 98.0°F | Ht 68.5 in | Wt 209.2 lb

## 2013-01-26 DIAGNOSIS — J449 Chronic obstructive pulmonary disease, unspecified: Secondary | ICD-10-CM

## 2013-01-26 NOTE — Assessment & Plan Note (Signed)
He relates his dizziness and low BP readings to his nebs (even the budesonide) but in retrospect he can have episodes at other times as well. No reason to suspect that these meds would lower BP. His cardiologist asked him to decrease the nebs which he has done - now more dyspneic.  - we will temporarily STOP the scheduled nebs - he will continue to keep track of BP readings and sx > I believe he will need his BP regimen adjusted soon by Cards.  - will arrange quick f/u w Dr Kendrick Fries, acknowledging that this is not a good permanent solution, he will need to be back on scheduled BD's soon.

## 2013-01-26 NOTE — Patient Instructions (Addendum)
Please stop your perforamist, budesonide and albuterol/atrovent nebulizers for now. The purpose of this is to see if your dizziness and low blood pressure change off these medications.  You may use albuterol nebulizer if needed for worsening shortness of breath Keep a record of your BP readings to report to your doctors I suspect that you will need to have your blood pressure medications adjusted by your cardiologist.  Follow with John Mendoza in 1 -2 weeks to decide which inhaled medications you should restart

## 2013-01-26 NOTE — Progress Notes (Signed)
Subjective:    Patient ID: John Mendoza, male    DOB: 1945-12-26, 67 y.o.   MRN: [id number]  Synopsis: John Mendoza first saw the [place] [facility] in July 2013 for evaluation of shortness of breath. He smoked 2 packs of cigarettes daily for 35 years and quit in 1996 when he had a three-vessel coronary artery bypass graft. In 1999 he developed throat cancer and underwent a radical neck dissection and 36 days of radiation therapy at the [facility]. In 2013 he developed worsening cough and shortness of breath and had a barium swallow performed at [facility] showing pharyngeal dysphasia.  HPI  05/07/2012 ROV -- Since her last visit John Mendoza says his shortness of breath is getting worse. He never took the Spiriva and never called me to tell me this. He says that he didn't want pay the co-pay. He is coughing up food on a fairly regular basis though he cannot feel any sensation of choking on food or aspiration at the time that he's eating. He says at night he coughs up yellow-looking phlegm. He denies overt fevers that he says he does have chills quite often. He does not have night sweats and his weight has been stable. He denies chest pain but says that the dyspnea is significant enough that his cardiologist is considering repeating left heart catheterization.  08/03/2012 ROV -- Since our last visit John Mendoza still has not obtained Spiriva or used it. He tried to get the drug from the [place] but did not pass the means test. He states that his shortness of breath, cough, and intermittent wheezing has not worsened. He occasionally produces sputum which is creamy in color. He saw John. Andee Mendoza who stated that there was really nothing that could be done to prevent aspiration short of a laryngectomy and tracheostomy.  He uses albuterol at least twice a day and it helps with shortness of breath but sometimes make him more shaky.   Acute OV 01/26/13 -- followed by John Mendoza for COPD, nodular disease with MAIC  (CT planned for 8/14), chronic aspiration. Presents today for concerns regarding his perforamist, budesonide or albuterol/atrovent nebs - he has cut these down to qd on the recommendation of  His cardiologist apparently because ept notes a related dizziness and low BP readings to his nebs. He also notes more dyspnea (since cutting the nebs down).  Note he is on several BP meds - these were not adjusted.   He has been treated for staph skin infection on scalp, also has been seen by rheum for positive ANA in the last month John Mendoza). He does have a family hx SLE.    Past Medical History  Diagnosis Date  . Barrett esophagus   . Hypertension   . Hyperlipidemia   . Depression   . Multiple pulmonary nodules   . CHF (congestive heart failure)   . Lumbar spinal stenosis   . S/P CABG (coronary artery bypass graft)   . COPD (chronic obstructive pulmonary disease)   . CAD (coronary artery disease)   . Basal cell carcinoma   . Diabetes mellitus   . Cancer of neck   . Heart attack      Review of Systems  Constitutional: Negative for chills and fatigue.  HENT: Negative for nosebleeds, congestion and postnasal drip.   Respiratory: Positive for cough.        Objective:   Physical Exam  Filed Vitals:   01/26/13 1106  BP: 130/78  Pulse: 59  Temp: 98 F (36.7 C)  TempSrc: Oral  Height: 5' 8.5" (1.74 m)  Weight: 209 lb 3.2 oz (94.892 kg)  SpO2: 97%   Gen: well appearing, no acute distress HEENT: NCAT, PERRL, s/p radical neck dissection, OP dry PULM: CTA R, Insp crackles throughout L CV: RRR, no mgr, no JVD AB: BS+, soft, nontender, no hsm Ext: warm, no edema, no clubbing, no cyanosis  01/15/2012 simple spirometry: Flow volume loop shows obstruction FEV1 to FVC ratio is 70% FEV1 is 1.82 L (51% predicted)  August 2012 CT chest [facility]: (My read) Left basilar groundglass opacity most consistent with an infectious process scattered nodules all less than 1 cm throughout both lobes.  Emphysema noted some tree in bed opacities noted.  June 2013 CT chest [facility] colon (my read) the left basilar groundglass opacity seen on the August 2012 study is no longer present. There is emphysema noted throughout and the the quality of the study on my computer is limited I appreciate tree in bud opacities in the bases bilaterally. There are also multiple scattered nodules in both lungs. There is one pleural-based nodule in the left lung which unfortunately I am unable to see completely which I measure at 1.6 cm.      Assessment & Plan:   COPD (chronic obstructive pulmonary disease) He relates his dizziness and low BP readings to his nebs (even the budesonide) but in retrospect he can have episodes at other times as well. No reason to suspect that these meds would lower BP. His cardiologist asked him to decrease the nebs which he has done - now more dyspneic.  - we will temporarily STOP the scheduled nebs - he will continue to keep track of BP readings and sx > I believe he will need his BP regimen adjusted soon by Cards.  - will arrange quick f/u w John Mendoza, acknowledging that this is not a good permanent solution, he will need to be back on scheduled BD's soon.     Updated Medication List Outpatient Encounter Prescriptions as of 01/26/2013  Medication Sig Dispense Refill  . amLODipine (NORVASC) 10 MG tablet Take 10 mg by mouth daily.      Marland Kitchen aspirin 325 MG tablet Take 325 mg by mouth daily.      . bisoprolol (ZEBETA) 10 MG tablet Take 10 mg by mouth daily.      . budesonide (PULMICORT) 0.25 MG/2ML nebulizer solution Take 2 mLs (0.25 mg total) by nebulization 2 (two) times daily.  60 mL  5  . CLONAZEPAM PO Take by mouth. 1.5 mg at night and 0.75mg  in the morning      . doxycycline (VIBRAMYCIN) 100 MG capsule Take 100 mg by mouth 2 (two) times daily.      . formoterol (PERFOROMIST) 20 MCG/2ML nebulizer solution Take 2 mLs (20 mcg total) by nebulization 2 (two) times daily.  120 mL   5  . furosemide (LASIX) 20 MG tablet Take 20 mg by mouth daily.      Marland Kitchen ipratropium-albuterol (DUONEB) 0.5-2.5 (3) MG/3ML SOLN Take 3 mLs by nebulization daily.  90 mL  5  . levothyroxine (SYNTHROID, LEVOTHROID) 125 MCG tablet Take 125 mcg by mouth daily.      Marland Kitchen losartan (COZAAR) 50 MG tablet Take 1 tablet (50 mg total) by mouth daily.  30 tablet  11  . metFORMIN (GLUCOPHAGE) 1000 MG tablet Take 1,000 mg by mouth daily.      Marland Kitchen omeprazole (PRILOSEC) 40 MG capsule Take 40  mg by mouth 2 (two) times daily.      Marland Kitchen PARoxetine (PAXIL) 20 MG tablet Take 20 mg by mouth 3 (three) times daily.      . simvastatin (ZOCOR) 80 MG tablet Take 80 mg by mouth at bedtime.      . tamsulosin (FLOMAX) 0.4 MG CAPS Take 0.4 mg by mouth daily.       . [DISCONTINUED] budesonide (PULMICORT) 0.5 MG/2ML nebulizer solution Take 2 mLs (0.5 mg total) by nebulization 2 (two) times daily.  120 mL  5   No facility-administered encounter medications on file as of 01/26/2013.

## 2013-02-08 ENCOUNTER — Ambulatory Visit (INDEPENDENT_AMBULATORY_CARE_PROVIDER_SITE_OTHER): Payer: Medicare Other | Admitting: Pulmonary Disease

## 2013-02-08 ENCOUNTER — Encounter: Payer: Self-pay | Admitting: Pulmonary Disease

## 2013-02-08 VITALS — BP 116/70 | HR 55 | Temp 98.4°F | Ht 68.5 in | Wt 207.0 lb

## 2013-02-08 DIAGNOSIS — R768 Other specified abnormal immunological findings in serum: Secondary | ICD-10-CM

## 2013-02-08 DIAGNOSIS — J449 Chronic obstructive pulmonary disease, unspecified: Secondary | ICD-10-CM

## 2013-02-08 DIAGNOSIS — R21 Rash and other nonspecific skin eruption: Secondary | ICD-10-CM

## 2013-02-08 DIAGNOSIS — R7689 Other specified abnormal immunological findings in serum: Secondary | ICD-10-CM

## 2013-02-08 DIAGNOSIS — R918 Other nonspecific abnormal finding of lung field: Secondary | ICD-10-CM

## 2013-02-08 DIAGNOSIS — R894 Abnormal immunological findings in specimens from other organs, systems and tissues: Secondary | ICD-10-CM

## 2013-02-08 NOTE — Assessment & Plan Note (Signed)
He is very concerned about the possibility of lupus because of chronic thrombocytopenia, various rashes, and fatigue and malaise along with a strong family history of lupus. I explained to him that I think that the ANA is most likely a false positive. We will check a double-stranded DNA.

## 2013-02-08 NOTE — Patient Instructions (Addendum)
Take the spiriva samples for a month and call us if you want a prescription We will set up a CT scan of your chest to evaluate the left lung lesion  STart taking the pulmicort again  We will call you with the results of your blood test  We will see you back in 2 months or sooner if needed

## 2013-02-08 NOTE — Assessment & Plan Note (Signed)
There are multiple nodules scattered throughout, which associated with aspiration and bronchiectasis are most likely the sequela of chronic aspiration versus MAI. Radiology has consistently commented that they feel that the changes in the left lower lobe are reflective of chronic aspiration. However, given his malignancy history and the fact that the lesion in the left lower lobe has grown, I am very concerned. I am going to order a full CT scan of his chest and if the left lower lobe lung lesion is still present then we will order a CT guided biopsy.

## 2013-02-08 NOTE — Progress Notes (Signed)
Subjective:    Patient ID: John Mendoza, male    DOB: 26-Oct-1945, 66 y.o.   MRN: [id number]  Synopsis: John Mendoza first saw the [place] [facility] in July 2013 for evaluation of shortness of breath. He smoked 2 packs of cigarettes daily for 35 years and quit in 1996 when he had a three-vessel coronary artery bypass graft. In 1999 he developed throat cancer and underwent a radical neck dissection and 36 days of radiation therapy at the [facility]. In 2013 he developed worsening cough and shortness of breath and had a barium swallow performed at [facility] showing pharyngeal dysphasia.  HPI   05/07/2012 ROV -- Since her last visit John Mendoza says his shortness of breath is getting worse. He never took the Spiriva and never called me to tell me this. He says that he didn't want pay the co-pay. He is coughing up food on a fairly regular basis though he cannot feel any sensation of choking on food or aspiration at the time that he's eating. He says at night he coughs up yellow-looking phlegm. He denies overt fevers that he says he does have chills quite often. He does not have night sweats and his weight has been stable. He denies chest pain but says that the dyspnea is significant enough that his cardiologist is considering repeating left heart catheterization.  08/03/2012 ROV -- Since our last visit John Mendoza still has not obtained Spiriva or used it. He tried to get the drug from the [place] but did not pass the means test. He states that his shortness of breath, cough, and intermittent wheezing has not worsened. He occasionally produces sputum which is creamy in color. He saw Dr. Andee Poles who stated that there was really nothing that could be done to prevent aspiration short of a laryngectomy and tracheostomy.  He uses albuterol at least twice a day and it helps with shortness of breath but sometimes make him more shaky.   Acute OV 01/26/13 -- followed by Dr Kendrick Fries for COPD, nodular disease with  MAIC (CT planned for 8/14), chronic aspiration. Presents today for concerns regarding his perforamist, budesonide or albuterol/atrovent nebs - he has cut these down to qd on the recommendation of  His cardiologist apparently because ept notes a related dizziness and low BP readings to his nebs. He also notes more dyspnea (since cutting the nebs down).  Note he is on several BP meds - these were not adjusted.   He has been treated for staph skin infection on scalp, also has been seen by rheum for positive ANA in the last month [name]). He does have a family hx SLE.   02/08/2013 ROV >> Since the last visit he continues to have ongoing shortness of breath.  He has suffered from a recurrent scalp infection with staph.  Apparently he has been treated with two rounds of antibiotics.  He has also been found to have an elevated ANA.   He recently had the sensation of presyncope after starting the pulmicort and perforomist that he started getting from a new pharmacy in [place].  He said that the shipping material and the drug packaging was clearing different from the formulation he had previously been using without difficulty.     Past Medical History  Diagnosis Date  . Barrett esophagus   . Hypertension   . Hyperlipidemia   . Depression   . Multiple pulmonary nodules   . CHF (congestive heart failure)   . Lumbar spinal stenosis   .  S/P CABG (coronary artery bypass graft)   . COPD (chronic obstructive pulmonary disease)   . CAD (coronary artery disease)   . Basal cell carcinoma   . Diabetes mellitus   . Cancer of neck   . Heart attack      Review of Systems  Constitutional: Negative for chills and fatigue.  HENT: Negative for nosebleeds, congestion and postnasal drip.   Respiratory: Positive for cough and shortness of breath. Negative for wheezing.   Cardiovascular: Negative for chest pain, palpitations and leg swelling.       Objective:   Physical Exam   Filed Vitals:   02/08/13 1625   BP: 116/70  Pulse: 55  Temp: 98.4 F (36.9 C)  TempSrc: Oral  Height: 5' 8.5" (1.74 m)  Weight: 207 lb (93.895 kg)  SpO2: 95%  RA  Gen: well appearing, no acute distress HEENT: NCAT, PERRL, s/p radical neck dissection, OP dry PULM: CTA R, Insp crackles throughout L CV: RRR, no mgr, no JVD AB: BS+, soft, nontender, no hsm Ext: warm, no edema, no clubbing, no cyanosis  01/15/2012 simple spirometry: Flow volume loop shows obstruction FEV1 to FVC ratio is 70% FEV1 is 1.82 L (51% predicted)  August 2012 CT chest [facility]: (My read) Left basilar groundglass opacity most consistent with an infectious process scattered nodules all less than 1 cm throughout both lobes. Emphysema noted some tree in bed opacities noted.  June 2013 CT chest [facility] colon (my read) the left basilar groundglass opacity seen on the August 2012 study is no longer present. There is emphysema noted throughout and the the quality of the study on my computer is limited I appreciate tree in bud opacities in the bases bilaterally. There are also multiple scattered nodules in both lungs. There is one pleural-based nodule in the left lung which unfortunately I am unable to see completely which I measure at 1.6 cm.      Assessment & Plan:   Multiple pulmonary nodules There are multiple nodules scattered throughout, which associated with aspiration and bronchiectasis are most likely the sequela of chronic aspiration versus MAI. Radiology has consistently commented that they feel that the changes in the left lower lobe are reflective of chronic aspiration. However, given his malignancy history and the fact that the lesion in the left lower lobe has grown, I am very concerned. I am going to order a full CT scan of his chest and if the left lower lobe lung lesion is still present then we will order a CT guided biopsy.  COPD (chronic obstructive pulmonary disease) He is convinced that formoterol caused him to be hypotensive. I  am less convinced.  Plan: -Restart Pulmicort -Trial of Spiriva -He will call us for a prescription for Spiriva if he finds it helps.  Elevated antinuclear antibody (ANA) level He is very concerned about the possibility of lupus because of chronic thrombocytopenia, various rashes, and fatigue and malaise along with a strong family history of lupus. I explained to him that I think that the ANA is most likely a false positive. We will check a double-stranded DNA.    Updated Medication List Outpatient Encounter Prescriptions as of 02/08/2013  Medication Sig Dispense Refill  . amLODipine (NORVASC) 10 MG tablet Take 10 mg by mouth daily.      Marland Kitchen aspirin 325 MG tablet Take 325 mg by mouth daily.      . bisoprolol (ZEBETA) 10 MG tablet Take 10 mg by mouth daily.      Marland Kitchen  CLONAZEPAM PO Take by mouth. 1.5 mg at night and 0.75mg  in the morning      . doxycycline (VIBRAMYCIN) 100 MG capsule Take 100 mg by mouth 2 (two) times daily.      . furosemide (LASIX) 20 MG tablet Take 20 mg by mouth daily.      Marland Kitchen levothyroxine (SYNTHROID, LEVOTHROID) 125 MCG tablet Take 125 mcg by mouth daily.      Marland Kitchen losartan (COZAAR) 50 MG tablet Take 1 tablet (50 mg total) by mouth daily.  30 tablet  11  . metFORMIN (GLUCOPHAGE) 500 MG tablet Take 500 mg by mouth 2 (two) times daily with a meal.      . omeprazole (PRILOSEC) 40 MG capsule Take 40 mg by mouth 2 (two) times daily.      Marland Kitchen PARoxetine (PAXIL) 20 MG tablet Take 20 mg by mouth 3 (three) times daily.      . simvastatin (ZOCOR) 80 MG tablet Take 80 mg by mouth at bedtime.      . tamsulosin (FLOMAX) 0.4 MG CAPS Take 0.4 mg by mouth daily.       . budesonide (PULMICORT) 0.25 MG/2ML nebulizer solution Take 2 mLs (0.25 mg total) by nebulization 2 (two) times daily.  60 mL  5  . formoterol (PERFOROMIST) 20 MCG/2ML nebulizer solution Take 2 mLs (20 mcg total) by nebulization 2 (two) times daily.  120 mL  5  . ipratropium-albuterol (DUONEB) 0.5-2.5 (3) MG/3ML SOLN Take 3 mLs by  nebulization daily.  90 mL  5  . [DISCONTINUED] metFORMIN (GLUCOPHAGE) 1000 MG tablet Take 1,000 mg by mouth daily.       No facility-administered encounter medications on file as of 02/08/2013.

## 2013-02-08 NOTE — Assessment & Plan Note (Signed)
He is convinced that formoterol caused him to be hypotensive. I am less convinced.  Plan: -Restart Pulmicort -Trial of Spiriva -He will call us for a prescription for Spiriva if he finds it helps.

## 2013-02-09 LAB — ANTI-DNA ANTIBODY, DOUBLE-STRANDED: ds DNA Ab: 9 IU/mL (ref <30)

## 2013-02-10 ENCOUNTER — Telehealth: Payer: Self-pay | Admitting: Pulmonary Disease

## 2013-02-10 NOTE — Progress Notes (Signed)
Quick Note:  Spoke with pt's spouse and notified of results per Dr. Kendrick Fries. She verbalized understanding and denied any questions. Will inform the pt.  ______

## 2013-02-10 NOTE — Telephone Encounter (Signed)
Ct has been scheduled for [facility] 02/14/13 @ 10am [facility] in [place],  pt is aware. Kandice Hams

## 2013-02-10 NOTE — Telephone Encounter (Signed)
Per B 02/08/13: Patient Instructions    Take the spiriva samples for a month and call us if you want a prescription  We will set up a CT scan of your chest to evaluate the left lung lesion   --  There is an order in [facility] but not sure if this order will work. Please advise PCC's thanks

## 2013-02-15 ENCOUNTER — Telehealth: Payer: Self-pay | Admitting: Pulmonary Disease

## 2013-02-15 DIAGNOSIS — R918 Other nonspecific abnormal finding of lung field: Secondary | ICD-10-CM

## 2013-02-15 NOTE — Telephone Encounter (Signed)
The order has already been placed by Dr. Kendrick Fries. I will check with Almyra Free to make sure that they have the order in the [facility] workque.  Per Alida - this order is in the [employer] and will be taken care of.

## 2013-02-15 NOTE — Telephone Encounter (Signed)
I called Mr. Vanderpol to discuss the CT chest which whoed some new small (< 1cm nodules), but no change in the lesion in the base of his lung.  I explained to him that I am concerned about the left lower lobe lesion and that I discussed it with radiology at [facility] who suggested that we consider a PET/CT first, prior to a needle biopsy.  I will CC triage on this note so that they can arrange a PET/CT at [facility] for the left lung mass.

## 2013-02-15 NOTE — Telephone Encounter (Signed)
Will route to BQ nurse. Please advise Lillia Abed thanks

## 2013-02-21 ENCOUNTER — Ambulatory Visit: Payer: Self-pay | Admitting: Pulmonary Disease

## 2013-02-24 ENCOUNTER — Telehealth: Payer: Self-pay | Admitting: Pulmonary Disease

## 2013-02-24 NOTE — Telephone Encounter (Signed)
Please try to track down the results from [facility]. Call him and let him know that I don't have the results yet and will be back next Wednesday.

## 2013-02-24 NOTE — Telephone Encounter (Signed)
LMOMTCB x1 for pt. Will have to forward message over to Dr. Kendrick Fries advising results. Please advise thanks

## 2013-02-24 NOTE — Telephone Encounter (Signed)
I spoke with pt and made him aware. He voiced his understanding. Please advise Dr. Kendrick Fries

## 2013-02-24 NOTE — Telephone Encounter (Signed)
Returning call.

## 2013-02-25 NOTE — Telephone Encounter (Signed)
I called and advised pt. I also called [facility] and asked that they fax over results to triage fax. Will await fax. Carron Curie, CMA

## 2013-02-25 NOTE — Telephone Encounter (Signed)
I received results and placed them in BQ look-at paperwork. Carron Curie, CMA

## 2013-03-02 NOTE — Telephone Encounter (Signed)
Great, since it is negative, please call him to let him know that the findings were not concerning and that he and I will discuss follow up CT's, etc on the next visit.  For now, he does not need a biopsy.  Thanks,

## 2013-03-02 NOTE — Telephone Encounter (Signed)
Dr. Kendrick Fries called the office this morning and asked that I send him the impression of the scan.  Impression: Mild FDG uptake in left lower lobe in regions of patient's infiltrate. No definitive PET positive lesions are noted. Follow up chest CT to demonstrate clearing suggested.

## 2013-03-03 NOTE — Telephone Encounter (Signed)
Pt is aware of PET scan results.

## 2013-03-08 ENCOUNTER — Encounter: Payer: Self-pay | Admitting: Pulmonary Disease

## 2013-03-17 ENCOUNTER — Encounter: Payer: Self-pay | Admitting: Pulmonary Disease

## 2013-03-29 ENCOUNTER — Ambulatory Visit (INDEPENDENT_AMBULATORY_CARE_PROVIDER_SITE_OTHER): Payer: Medicare Other | Admitting: Pulmonary Disease

## 2013-03-29 ENCOUNTER — Encounter: Payer: Self-pay | Admitting: Pulmonary Disease

## 2013-03-29 VITALS — BP 170/80 | HR 62 | Temp 98.1°F | Ht 68.5 in | Wt 207.0 lb

## 2013-03-29 DIAGNOSIS — J449 Chronic obstructive pulmonary disease, unspecified: Secondary | ICD-10-CM

## 2013-03-29 DIAGNOSIS — R918 Other nonspecific abnormal finding of lung field: Secondary | ICD-10-CM

## 2013-03-29 DIAGNOSIS — Z23 Encounter for immunization: Secondary | ICD-10-CM

## 2013-03-29 NOTE — Progress Notes (Signed)
Subjective:    Patient ID: John Mendoza, male    DOB: 1946/06/26, 67 y.o.   MRN: [id number]  Synopsis: John Mendoza first saw the [place] [facility] in July 2013 for evaluation of shortness of breath. He smoked 2 packs of cigarettes daily for 35 years and quit in 1996 when he had a three-vessel coronary artery bypass graft. In 1999 he developed throat cancer and underwent a radical neck dissection and 36 days of radiation therapy at the [facility]. In 2013 he developed worsening cough and shortness of breath and had a barium swallow performed at [facility] [place] showing pharyngeal dysphasia.  HPI   05/07/2012 ROV -- Since her last visit John Mendoza says his shortness of breath is getting worse. He never took the Spiriva and never called me to tell me this. He says that he didn't want pay the co-pay. He is coughing up food on a fairly regular basis though he cannot feel any sensation of choking on food or aspiration at the time that he's eating. He says at night he coughs up yellow-looking phlegm. He denies overt fevers that he says he does have chills quite often. He does not have night sweats and his weight has been stable. He denies chest pain but says that the dyspnea is significant enough that his cardiologist is considering repeating left heart catheterization.  08/03/2012 ROV -- Since our last visit John Mendoza still has not obtained Spiriva or used it. He tried to get the drug from the [place] but did not pass the means test. He states that his shortness of breath, cough, and intermittent wheezing has not worsened. He occasionally produces sputum which is creamy in color. He saw Dr. Andee Mendoza who stated that there was really nothing that could be done to prevent aspiration short of a laryngectomy and tracheostomy.  He uses albuterol at least twice a day and it helps with shortness of breath but sometimes make him more shaky.   Acute OV 01/26/13 -- followed by Dr John Mendoza for COPD, nodular disease with  MAIC (CT planned for 8/14), chronic aspiration. Presents today for concerns regarding his perforamist, budesonide or albuterol/atrovent nebs - he has cut these down to qd on the recommendation of  His cardiologist apparently because ept notes a related dizziness and low BP readings to his nebs. He also notes more dyspnea (since cutting the nebs down).  Note he is on several BP meds - these were not adjusted.   He has been treated for staph skin infection on scalp, also has been seen by rheum for positive ANA in the last month John Mendoza). He does have a family hx SLE.   02/08/2013 ROV >> Since the last visit he continues to have ongoing shortness of breath.  He has suffered from a recurrent scalp infection with staph.  Apparently he has been treated with two rounds of antibiotics.  He has also been found to have an elevated ANA.   He recently had the sensation of presyncope after starting the pulmicort and perforomist that he started getting from a new pharmacy in [place].  He said that the shipping material and the drug packaging was clearing different from the formulation he had previously been using without difficulty.    03/29/2013 ROV> John Mendoza is doing well. He hasn't had too much shortness of breath unless he is working "real hard". Spiriva didn't help.  He hasn't used his pulmicort in the last two weeks.  He has been push mowing the yard  and walking regularly.  He is currently on doxycycline for the cellulitis and it seems to be helping.   Past Medical History  Diagnosis Date  . Barrett esophagus   . Hypertension   . Hyperlipidemia   . Depression   . Multiple pulmonary nodules   . CHF (congestive heart failure)   . Lumbar spinal stenosis   . S/P CABG (coronary artery bypass graft)   . COPD (chronic obstructive pulmonary disease)   . CAD (coronary artery disease)   . Basal cell carcinoma   . Diabetes mellitus   . Cancer of neck   . Heart attack      Review of Systems  Constitutional:  Negative for chills and fatigue.  HENT: Negative for nosebleeds, congestion and postnasal drip.   Respiratory: Negative for cough, shortness of breath and wheezing.   Cardiovascular: Negative for chest pain, palpitations and leg swelling.       Objective:   Physical Exam   Filed Vitals:   03/29/13 1405  BP: 170/80  Pulse: 62  Temp: 98.1 F (36.7 C)  TempSrc: Oral  Height: 5' 8.5" (1.74 m)  Weight: 207 lb (93.895 kg)  SpO2: 95%  RA  Gen: well appearing, no acute distress HEENT: NCAT, PERRL, s/p radical neck dissection, OP dry PULM: CTA R, Insp crackles throughout L CV: RRR, no mgr, no JVD AB: BS+, soft, nontender, no hsm Ext: warm, no edema, no clubbing, no cyanosis  01/15/2012 simple spirometry: Flow volume loop shows obstruction FEV1 to FVC ratio is 70% FEV1 is 1.82 L (51% predicted)  August 2012 CT chest [facility]: (My read) Left basilar groundglass opacity most consistent with an infectious process scattered nodules all less than 1 cm throughout both lobes. Emphysema noted some tree in bed opacities noted.  June 2013 CT chest [facility] colon (my read) the left basilar groundglass opacity seen on the August 2012 study is no longer present. There is emphysema noted throughout and the the quality of the study on my computer is limited I appreciate tree in bud opacities in the bases bilaterally. There are also multiple scattered nodules in both lungs. There is one pleural-based nodule in the left lung which unfortunately I am unable to see completely which I measure at 1.6 cm.      Assessment & Plan:   No problem-specific assessment & plan notes found for this encounter.   Updated Medication List Outpatient Encounter Prescriptions as of 03/29/2013  Medication Sig Dispense Refill  . amLODipine (NORVASC) 10 MG tablet Take 10 mg by mouth at bedtime.      Marland Kitchen aspirin 325 MG tablet Take 325 mg by mouth daily.      . budesonide (PULMICORT) 0.25 MG/2ML nebulizer solution Take 0.25  mg by nebulization as needed.      Marland Kitchen CLONAZEPAM PO Take by mouth. 1.5 mg at night and 0.75mg  in the morning      . doxycycline (VIBRAMYCIN) 100 MG capsule Take 100 mg by mouth 2 (two) times daily.      Marland Kitchen ipratropium-albuterol (DUONEB) 0.5-2.5 (3) MG/3ML SOLN Take 3 mLs by nebulization every 6 (six) hours as needed.      Marland Kitchen levothyroxine (SYNTHROID, LEVOTHROID) 125 MCG tablet Take 125 mcg by mouth daily.      Marland Kitchen losartan (COZAAR) 50 MG tablet Take 1 tablet (50 mg total) by mouth daily.  30 tablet  11  . metFORMIN (GLUCOPHAGE) 500 MG tablet Take 500 mg by mouth 2 (two) times daily with a meal.      .  omeprazole (PRILOSEC) 40 MG capsule Take 40 mg by mouth 2 (two) times daily.      Marland Kitchen PARoxetine (PAXIL) 20 MG tablet Take 20 mg by mouth 3 (three) times daily.      . simvastatin (ZOCOR) 80 MG tablet Take 80 mg by mouth at bedtime.      . tamsulosin (FLOMAX) 0.4 MG CAPS Take 0.4 mg by mouth daily.       . [DISCONTINUED] budesonide (PULMICORT) 0.25 MG/2ML nebulizer solution Take 2 mLs (0.25 mg total) by nebulization 2 (two) times daily.  60 mL  5  . [DISCONTINUED] ipratropium-albuterol (DUONEB) 0.5-2.5 (3) MG/3ML SOLN Take 3 mLs by nebulization daily.  90 mL  5  . [DISCONTINUED] amLODipine (NORVASC) 10 MG tablet Take 10 mg by mouth daily.      . [DISCONTINUED] bisoprolol (ZEBETA) 10 MG tablet Take 10 mg by mouth daily.      . [DISCONTINUED] doxycycline (VIBRAMYCIN) 100 MG capsule Take 100 mg by mouth 2 (two) times daily.      . [DISCONTINUED] formoterol (PERFOROMIST) 20 MCG/2ML nebulizer solution Take 2 mLs (20 mcg total) by nebulization 2 (two) times daily.  120 mL  5  . [DISCONTINUED] furosemide (LASIX) 20 MG tablet Take 20 mg by mouth daily.       No facility-administered encounter medications on file as of 03/29/2013.

## 2013-03-29 NOTE — Patient Instructions (Signed)
We will change the CT scan for your nodule for 06/2013 Use the albuterol inhaler as needed for shortness of breath, when this runs out we will call in a prescription for combivent to be used the same way We will see you back in 6-7 months or sooner if needed

## 2013-03-29 NOTE — Assessment & Plan Note (Signed)
I'm happy that the PET CT from [facility] looked OK.  We need to continue to follow the nodule since it was PET indeterminant.  Plan: -CT chest [facility] 06/2013 for the nodule -if negative, repeat again in 11/2013

## 2013-03-29 NOTE — Assessment & Plan Note (Addendum)
John Mendoza remains non-compliant but despite that he feels well and is quite active.  Plan: -flu shot today -educated on benefits of regular pulmicort/bronchodilator use -gave albuterol for prn use, consider combivent QID -he has done well on the doxycycline given for the skin problem, would consider monthly doxycycline once he finishes this course

## 2013-04-12 ENCOUNTER — Telehealth: Payer: Self-pay | Admitting: Pulmonary Disease

## 2013-04-12 NOTE — Telephone Encounter (Signed)
Pt has order for CT scan in [facility] but per last OV note this is not due until December. Please advise PCC's thanks

## 2013-04-13 NOTE — Telephone Encounter (Signed)
Asked dr Kendrick Fries and he wants pt in dec to do go to [facility] pt will be called in nov for this appt John Mendoza

## 2013-04-19 ENCOUNTER — Other Ambulatory Visit: Payer: Self-pay | Admitting: Pulmonary Disease

## 2013-04-19 DIAGNOSIS — R918 Other nonspecific abnormal finding of lung field: Secondary | ICD-10-CM

## 2013-06-23 ENCOUNTER — Ambulatory Visit: Payer: Self-pay | Admitting: Pulmonary Disease

## 2013-06-24 ENCOUNTER — Encounter: Payer: Self-pay | Admitting: Pulmonary Disease

## 2013-06-27 ENCOUNTER — Telehealth: Payer: Self-pay

## 2013-06-27 NOTE — Telephone Encounter (Signed)
Pt aware, nothing further needed. Caulfield,Ashley L

## 2013-06-27 NOTE — Telephone Encounter (Signed)
Message copied by Velvet Bathe on Mon Jun 27, 2013 10:49 AM ------      Message from: Lupita Leash      Created: Fri Jun 24, 2013  6:28 PM       A,            Please let him know that his CT looked great, no changes            Thanks      B ------

## 2013-07-21 ENCOUNTER — Encounter: Payer: Self-pay | Admitting: Pulmonary Disease

## 2013-07-28 ENCOUNTER — Ambulatory Visit: Payer: Self-pay | Admitting: Neurology

## 2013-08-29 ENCOUNTER — Telehealth: Payer: Self-pay | Admitting: Pulmonary Disease

## 2013-08-29 NOTE — Telephone Encounter (Signed)
Called spoke with pt. He reports he is having increase SOB w/ activity. He does not feel he can wait until Friday. Pt is scheduled to see TP here in the [place] office tomorrow for acute visit. He is aware to see emergency care if he worsens.

## 2013-08-30 ENCOUNTER — Ambulatory Visit: Payer: Medicare Other | Admitting: Adult Health

## 2013-09-02 ENCOUNTER — Ambulatory Visit (INDEPENDENT_AMBULATORY_CARE_PROVIDER_SITE_OTHER): Payer: Medicare Other | Admitting: Pulmonary Disease

## 2013-09-02 ENCOUNTER — Encounter: Payer: Self-pay | Admitting: Pulmonary Disease

## 2013-09-02 ENCOUNTER — Encounter (INDEPENDENT_AMBULATORY_CARE_PROVIDER_SITE_OTHER): Payer: Self-pay

## 2013-09-02 VITALS — BP 102/64 | HR 62 | Ht 68.5 in | Wt 187.0 lb

## 2013-09-02 DIAGNOSIS — R0602 Shortness of breath: Secondary | ICD-10-CM

## 2013-09-02 DIAGNOSIS — A310 Pulmonary mycobacterial infection: Secondary | ICD-10-CM

## 2013-09-02 DIAGNOSIS — R0989 Other specified symptoms and signs involving the circulatory and respiratory systems: Secondary | ICD-10-CM

## 2013-09-02 DIAGNOSIS — R0609 Other forms of dyspnea: Secondary | ICD-10-CM

## 2013-09-02 DIAGNOSIS — R0600 Dyspnea, unspecified: Secondary | ICD-10-CM

## 2013-09-02 DIAGNOSIS — R918 Other nonspecific abnormal finding of lung field: Secondary | ICD-10-CM

## 2013-09-02 DIAGNOSIS — J449 Chronic obstructive pulmonary disease, unspecified: Secondary | ICD-10-CM

## 2013-09-02 MED ORDER — ARFORMOTEROL TARTRATE 15 MCG/2ML IN NEBU
15.0000 ug | INHALATION_SOLUTION | Freq: Two times a day (BID) | RESPIRATORY_TRACT | Status: DC
Start: 2013-09-02 — End: 2013-11-03

## 2013-09-02 MED ORDER — BUDESONIDE 0.25 MG/2ML IN SUSP: 0.2500 mg | RESPIRATORY_TRACT | Status: DC | PRN

## 2013-09-02 NOTE — Progress Notes (Signed)
Subjective:    Patient ID: John Mendoza, male    DOB: 1946-05-20, 68 y.o.   MRN: [id number]  Synopsis: John Mendoza first saw the [place] [facility] in July 2013 for evaluation of shortness of breath. He smoked 2 packs of cigarettes daily for 35 years and quit in 1996 when he had a three-vessel coronary artery bypass graft. In 1999 he developed throat cancer and underwent a radical neck dissection and 36 days of radiation therapy at the [facility]. In 2013 he developed worsening cough and shortness of breath and had a barium swallow performed at [facility] showing pharyngeal dysphasia.  HPI   05/07/2012 ROV -- Since her last visit John Mendoza says his shortness of breath is getting worse. He never took the Spiriva and never called me to tell me this. He says that he didn't want pay the co-pay. He is coughing up food on a fairly regular basis though he cannot feel any sensation of choking on food or aspiration at the time that he's eating. He says at night he coughs up yellow-looking phlegm. He denies overt fevers that he says he does have chills quite often. He does not have night sweats and his weight has been stable. He denies chest pain but says that the dyspnea is significant enough that his cardiologist is considering repeating left heart catheterization.  08/03/2012 ROV -- Since our last visit John Mendoza still has not obtained Spiriva or used it. He tried to get the drug from the [place] but did not pass the means test. He states that his shortness of breath, cough, and intermittent wheezing has not worsened. He occasionally produces sputum which is creamy in color. He saw John. Pryor Ochoa who stated that there was really nothing that could be done to prevent aspiration short of a laryngectomy and tracheostomy.  He uses albuterol at least twice a day and it helps with shortness of breath but sometimes make him more shaky.   Acute OV 01/26/13 -- followed by John Mendoza for COPD, nodular disease with  MAIC (CT planned for 8/14), chronic aspiration. Presents today for concerns regarding his perforamist, budesonide or albuterol/atrovent nebs - he has cut these down to qd on the recommendation of  His cardiologist apparently because ept notes a related dizziness and low BP readings to his nebs. He also notes more dyspnea (since cutting the nebs down).  Note he is on several BP meds - these were not adjusted.   He has been treated for staph skin infection on scalp, also has been seen by rheum for positive ANA in the last month John Mendoza). He does have a family hx SLE.   02/08/2013 ROV >> Since the last visit he continues to have ongoing shortness of breath.  He has suffered from a recurrent scalp infection with staph.  Apparently he has been treated with two rounds of antibiotics.  He has also been found to have an elevated ANA.   He recently had the sensation of presyncope after starting the pulmicort and perforomist that he started getting from a new pharmacy in [place].  He said that the shipping material and the drug packaging was clearing different from the formulation he had previously been using without difficulty.    03/29/2013 ROV> John Mendoza is doing well. He hasn't had too much shortness of breath unless he is working "real hard". Spiriva didn't help.  He hasn't used his pulmicort in the last two weeks.  He has been push mowing the yard  and walking regularly.  He is currently on doxycycline for the cellulitis and it seems to be helping.  09/02/2013 ROV >> John Mendoza has been having more shortness of breath lately with bending over and when he is outside walking.  Yesterday while walking in the snow he has had shortness of breath. His BP has been falling blood pressure at times with associated dizziness.  Sometimes it has been as low as 60 or 70 systolic.  He has also noted feeling more forgetful lately.  He really feels like something has changed.  He has lost 20 lbs since the last visit.     Past Medical  History  Diagnosis Date  . Barrett esophagus   . Hypertension   . Hyperlipidemia   . Depression   . Multiple pulmonary nodules   . CHF (congestive heart failure)   . Lumbar spinal stenosis   . S/P CABG (coronary artery bypass graft)   . COPD (chronic obstructive pulmonary disease)   . CAD (coronary artery disease)   . Basal cell carcinoma   . Diabetes mellitus   . Cancer of neck   . Heart attack      Review of Systems  Constitutional: Negative for chills and fatigue.  HENT: Negative for congestion, nosebleeds and postnasal drip.   Respiratory: Negative for cough, shortness of breath and wheezing.   Cardiovascular: Negative for chest pain, palpitations and leg swelling.       Objective:   Physical Exam   Filed Vitals:   09/02/13 1119  BP: 102/64  Pulse: 62  Height: 5' 8.5" (1.74 m)  Weight: 187 lb (84.823 kg)  SpO2: 96%  RA  Gen: well appearing, no acute distress HEENT: NCAT, PERRL, s/p radical neck dissection, OP dry PULM: CTA L, Insp crackles throughout R CV: RRR, no mgr, no JVD AB: BS+, soft, nontender, no hsm Ext: warm, no edema, no clubbing, no cyanosis  01/15/2012 simple spirometry: Flow volume loop shows obstruction FEV1 to FVC ratio is 70% FEV1 is 1.82 L (51% predicted)  August 2012 CT chest [facility]: (My read) Left basilar groundglass opacity most consistent with an infectious process scattered nodules all less than 1 cm throughout both lobes. Emphysema noted some tree in bed opacities noted.  June 2013 CT chest [facility] colon (my read) the left basilar groundglass opacity seen on the August 2012 study is no longer present. There is emphysema noted throughout and the the quality of the study on my computer is limited I appreciate tree in bud opacities in the bases bilaterally. There are also multiple scattered nodules in both lungs. There is one pleural-based nodule in the left lung which unfortunately I am unable to see completely which I measure at 1.6  cm. 01/2012 sputum AFB positive for MAI in 1 of 3 cultures  04/15/2012 CT chest [facility]>> no lymphadenopathy, no change in pulmonary nodules; LLL bronchiectasis, likely aspiration  11/15/2012 CT chest [facility]> multiple new subcentimeter pulm nodules in the LLL, indeterminate but likely infectious vs aspiration; slighly increased LLL consodlidation likely due to chronic aspiration; diverticulosis but no diverticulitis; 2.4 cm lesion left lower lobe  02/2013 PET CT > no PET positive lesions identified; some consolidation noted in left lung as prior studies  06/2013 CT Chest > chronic scarring lower lobes, likely aspiration; nodules are unchanged     Assessment & Plan:   COPD (chronic obstructive pulmonary disease) John Mendoza has been taking his pulmicort and duoneb fairly regularly, but is not on another long acting  bronchodilator.  Plan: -start brovana bid -see dyspnea  Dyspnea John Mendoza has enough pulmonary reasons to be dyspnic with his COPD and his chronic aspiration. However, he looks more pale to me today and his daily cough and his pulmonary exam haven't changed.  His weight loss and chest pain are worrisome for an occult infection, malignancy, or ischemia which could explain his dyspnea.  Plan: -check PFT and CXR to look for a pulmonary cause -check CBC -see cardiologist ASAP -f/u 6 weeks  Multiple pulmonary nodules His next CT was recommended for 02/2014.  We will follow this schedule unless there is something unexpected seen on his CXR.  MAI (mycobacterium avium-intracellulare) Check sputum culture for AFB x3 again.    Updated Medication List Outpatient Encounter Prescriptions as of 09/02/2013  Medication Sig  . aspirin 325 MG tablet Take 325 mg by mouth daily.  . budesonide (PULMICORT) 0.25 MG/2ML nebulizer solution Take 0.25 mg by nebulization as needed.  Marland Kitchen CLONAZEPAM PO Take by mouth. 1.5 mg at night and 0.75mg  in the morning  . doxycycline (VIBRAMYCIN) 100 MG  capsule Take 100 mg by mouth 2 (two) times daily.  Marland Kitchen ipratropium-albuterol (DUONEB) 0.5-2.5 (3) MG/3ML SOLN Take 3 mLs by nebulization every 6 (six) hours as needed.  Marland Kitchen levothyroxine (SYNTHROID, LEVOTHROID) 125 MCG tablet Take 125 mcg by mouth daily.  Marland Kitchen losartan (COZAAR) 50 MG tablet Take 1 tablet (50 mg total) by mouth daily.  . metFORMIN (GLUCOPHAGE) 500 MG tablet Take 500 mg by mouth 2 (two) times daily with a meal.  . omeprazole (PRILOSEC) 40 MG capsule Take 40 mg by mouth 2 (two) times daily.  Marland Kitchen PARoxetine (PAXIL) 20 MG tablet Take 20 mg by mouth 3 (three) times daily.  . simvastatin (ZOCOR) 80 MG tablet Take 80 mg by mouth at bedtime.  . tamsulosin (FLOMAX) 0.4 MG CAPS Take 0.4 mg by mouth daily.   . [DISCONTINUED] amLODipine (NORVASC) 10 MG tablet Take 10 mg by mouth at bedtime.     I

## 2013-09-02 NOTE — Assessment & Plan Note (Addendum)
John Mendoza has enough pulmonary reasons to be dyspnic with his COPD and his chronic aspiration. However, he looks more pale to me today and his daily cough and his pulmonary exam haven't changed.  His weight loss and chest pain are worrisome for an occult infection, malignancy, or ischemia which could explain his dyspnea.  Plan: -check PFT and CXR to look for a pulmonary cause -check CBC -see cardiologist ASAP -f/u 6 weeks

## 2013-09-02 NOTE — Assessment & Plan Note (Signed)
Check sputum culture for AFB x3 again.

## 2013-09-02 NOTE — Patient Instructions (Signed)
We will call you with the results of your blood work, Chest X-ray, and pulmonary function tests Call us with your inhaled medicines See your cardiologist ASAP We will see you back in 6 weeks or sooner if needed

## 2013-09-02 NOTE — Assessment & Plan Note (Signed)
His next CT was recommended for 02/2014.  We will follow this schedule unless there is something unexpected seen on his CXR.

## 2013-09-02 NOTE — Assessment & Plan Note (Signed)
John Mendoza has been taking his pulmicort and duoneb fairly regularly, but is not on another long acting bronchodilator.  Plan: -start brovana bid -see dyspnea

## 2013-09-05 ENCOUNTER — Ambulatory Visit (INDEPENDENT_AMBULATORY_CARE_PROVIDER_SITE_OTHER)
Admission: RE | Admit: 2013-09-05 | Discharge: 2013-09-05 | Disposition: A | Discharge status: Home / Self Care | Payer: Medicare Other | Source: Ambulatory Visit | Attending: Pulmonary Disease | Admitting: Pulmonary Disease

## 2013-09-05 ENCOUNTER — Other Ambulatory Visit (INDEPENDENT_AMBULATORY_CARE_PROVIDER_SITE_OTHER): Payer: Medicare Other | Admitting: *Deleted

## 2013-09-05 DIAGNOSIS — R0602 Shortness of breath: Secondary | ICD-10-CM

## 2013-09-05 LAB — CBC WITH DIFFERENTIAL/PLATELET
Basophils Absolute: 0 10*3/uL (ref 0.0–0.1)
Basophils Relative: 0.5 % (ref 0.0–3.0)
EOS ABS: 0.1 10*3/uL (ref 0.0–0.7)
EOS PCT: 1.5 % (ref 0.0–5.0)
HCT: 39.9 % (ref 39.0–52.0)
Hemoglobin: 13.1 g/dL (ref 13.0–17.0)
LYMPHS ABS: 0.8 10*3/uL (ref 0.7–4.0)
Lymphocytes Relative: 16.5 % (ref 12.0–46.0)
MCHC: 32.7 g/dL (ref 30.0–36.0)
MCV: 84.7 fl (ref 78.0–100.0)
Monocytes Absolute: 0.3 10*3/uL (ref 0.1–1.0)
Monocytes Relative: 6.5 % (ref 3.0–12.0)
Neutro Abs: 3.8 10*3/uL (ref 1.4–7.7)
Neutrophils Relative %: 75 % (ref 43.0–77.0)
PLATELETS: 189 10*3/uL (ref 150.0–400.0)
RBC: 4.72 Mil/uL (ref 4.22–5.81)
RDW: 16.6 % — ABNORMAL HIGH (ref 11.5–14.6)
WBC: 5.1 10*3/uL (ref 4.5–10.5)

## 2013-09-06 ENCOUNTER — Telehealth: Payer: Self-pay

## 2013-09-06 NOTE — Telephone Encounter (Signed)
Pt aware of lab results.  Nothing further needed. 

## 2013-09-06 NOTE — Telephone Encounter (Signed)
Message copied by Len Blalock on Tue Sep 06, 2013  9:33 AM ------      Message from: John Mendoza      Created: Tue Sep 06, 2013  5:53 AM       A,      Please let him know that his CXR had no new findings      Thanks      B ------

## 2013-09-06 NOTE — Telephone Encounter (Signed)
Pt aware of cxr results.  Nothing further needed. 

## 2013-09-06 NOTE — Telephone Encounter (Signed)
Message copied by Len Blalock on Tue Sep 06, 2013  9:33 AM ------      Message from: Juanito Doom      Created: Tue Sep 06, 2013  5:58 AM       A,      Please let him know that his blood work was normal      Thanks      B ------

## 2013-09-08 ENCOUNTER — Other Ambulatory Visit: Payer: Self-pay | Admitting: Pulmonary Disease

## 2013-09-08 ENCOUNTER — Other Ambulatory Visit: Payer: Medicare Other

## 2013-09-08 ENCOUNTER — Ambulatory Visit: Payer: Self-pay | Admitting: Pulmonary Disease

## 2013-09-08 LAB — PULMONARY FUNCTION TEST

## 2013-09-10 LAB — AFB CULTURE WITH SMEAR (NOT AT ARMC): ACID FAST SMEAR: NONE SEEN

## 2013-09-12 ENCOUNTER — Encounter: Payer: Self-pay | Admitting: Pulmonary Disease

## 2013-09-12 ENCOUNTER — Telehealth: Payer: Self-pay

## 2013-09-12 NOTE — Telephone Encounter (Signed)
Message copied by Len Blalock on Mon Sep 12, 2013  2:42 PM ------      Message from: Simonne Maffucci B      Created: Mon Sep 12, 2013  8:50 AM       A,      Please let him know that his PFTs from [facility] last week were actually better than the one from the office in 2013            Thanks      B ------

## 2013-09-12 NOTE — Telephone Encounter (Signed)
Pt aware of results.  Nothing further needed.  

## 2013-09-28 ENCOUNTER — Telehealth: Payer: Self-pay | Admitting: Pulmonary Disease

## 2013-09-28 NOTE — Telephone Encounter (Signed)
The patient wants to know his sputum results.

## 2013-09-28 NOTE — Telephone Encounter (Signed)
Advised pt that it takes 6 weeks for results to come through for the sputum.  We will contact him when we have these results. He also requested that I cancel his appt for 3/31.  Nothing further needed.

## 2013-10-04 ENCOUNTER — Ambulatory Visit: Payer: Medicare Other | Admitting: Pulmonary Disease

## 2013-10-10 ENCOUNTER — Encounter: Payer: Self-pay | Admitting: Pulmonary Disease

## 2013-10-21 LAB — AFB CULTURE WITH SMEAR (NOT AT ARMC): ACID FAST SMEAR: NONE SEEN

## 2013-11-02 ENCOUNTER — Telehealth: Payer: Self-pay | Admitting: Pulmonary Disease

## 2013-11-02 NOTE — Telephone Encounter (Signed)
Pt has agreed to come to [facility] tomorrow to see TP at 9:45am. Nothing further is needed.

## 2013-11-03 ENCOUNTER — Encounter: Payer: Self-pay | Admitting: Adult Health

## 2013-11-03 ENCOUNTER — Ambulatory Visit (INDEPENDENT_AMBULATORY_CARE_PROVIDER_SITE_OTHER): Payer: Medicare Other | Admitting: Adult Health

## 2013-11-03 VITALS — BP 154/94 | HR 59 | Temp 97.8°F | Ht 70.0 in | Wt 191.0 lb

## 2013-11-03 DIAGNOSIS — J449 Chronic obstructive pulmonary disease, unspecified: Secondary | ICD-10-CM

## 2013-11-03 DIAGNOSIS — R0989 Other specified symptoms and signs involving the circulatory and respiratory systems: Secondary | ICD-10-CM

## 2013-11-03 DIAGNOSIS — R0600 Dyspnea, unspecified: Secondary | ICD-10-CM

## 2013-11-03 DIAGNOSIS — R0609 Other forms of dyspnea: Secondary | ICD-10-CM

## 2013-11-03 DIAGNOSIS — J4489 Other specified chronic obstructive pulmonary disease: Secondary | ICD-10-CM

## 2013-11-03 MED ORDER — ARFORMOTEROL TARTRATE 15 MCG/2ML IN NEBU: 15.0000 ug | INHALATION_SOLUTION | Freq: Two times a day (BID) | RESPIRATORY_TRACT | Status: DC

## 2013-11-03 NOTE — Progress Notes (Signed)
I agree with above 

## 2013-11-03 NOTE — Addendum Note (Signed)
Addended by: Parke Poisson E on: 11/03/2013 04:52 PM   Modules accepted: Orders

## 2013-11-03 NOTE — Assessment & Plan Note (Signed)
Mild COPD not controlled on rx - pt w/ medication noncompliance d/t cost issues  Will try nebs via DME to see if cheaper  Also needs to see cardiology ASAP as recommended before as having symptoms for last few months   Plan  Restart Budesonide and Brovana nebulizer twice daily. We will send these prescriptions to DME company Need to see Cardiology ASAP .  Do not take other people's medications please.  Follow up Dr. Lake Bells as planned and As needed   Please contact office for sooner follow up if symptoms do not improve or worsen or seek emergency care

## 2013-11-03 NOTE — Patient Instructions (Signed)
Restart Budesonide and Brovana nebulizer twice daily. We will send these prescriptions to DME company Need to see Cardiology ASAP .  Do not take other people's medications please.  Follow up Dr. Lake Bells as planned and As needed   Please contact office for sooner follow up if symptoms do not improve or worsen or seek emergency care

## 2013-11-03 NOTE — Progress Notes (Signed)
Subjective:    Patient ID: John Mendoza, male    DOB: 02-03-1946, 68 y.o.   MRN: [id number]  HPI Synopsis: [name] first saw the [place] [facility] clinic in July 2013 for evaluation of shortness of breath. He smoked 2 packs of cigarettes daily for 35 years and quit in 1996 when he had a three-vessel coronary artery bypass graft. In 1999 he developed throat cancer and underwent a radical neck dissection and 36 days of radiation therapy at the [facility]. In 2013 he developed worsening cough and shortness of breath and had a barium swallow performed at [facility] showing pharyngeal dysphasia.  HPI 05/07/2012 ROV -- Since [name] says his shortness of breath is getting worse. He never took the Spiriva and never called me to tell me this. He says that he didn't want pay the co-pay. He is coughing up food on a fairly regular basis though he cannot feel any sensation of choking on food or aspiration at the time that he's eating. He says at night he coughs up yellow-looking phlegm. He denies overt fevers that he says he does have chills quite often. He does not have night sweats and his weight has been stable. He denies chest pain but says that the dyspnea is significant enough that his cardiologist is considering repeating left heart catheterization.  08/03/2012 ROV -- Since our last visit [name] still has not obtained Spiriva or used it. He tried to get the drug from the [place] but did not pass the means test. He states that his shortness of breath, cough, and intermittent wheezing has not worsened. He occasionally produces sputum which is creamy in color. He saw Dr. Pryor Ochoa who stated that there was really nothing that could be done to prevent aspiration short of a laryngectomy and tracheostomy.  He uses albuterol at least twice a day and it helps with shortness of breath but sometimes make him more shaky.   Acute OV 01/26/13 -- followed by Dr Lake Bells for COPD, nodular disease with  MAIC (CT planned for 8/14), chronic aspiration. Presents today for concerns regarding his perforamist, budesonide or albuterol/atrovent nebs - he has cut these down to qd on the recommendation of  His cardiologist apparently because ept notes a related dizziness and low BP readings to his nebs. He also notes more dyspnea (since cutting the nebs down).  Note he is on several BP meds - these were not adjusted.   He has been treated for staph skin infection on scalp, also has been seen by rheum for positive ANA in the last month [name]). He does have a family hx SLE.   02/08/2013 ROV >> Since the last visit he continues to have ongoing shortness of breath.  He has suffered from a recurrent scalp infection with staph.  Apparently he has been treated with two rounds of antibiotics.  He has also been found to have an elevated ANA.   He recently had the sensation of presyncope after starting the pulmicort and perforomist that he started getting from a new pharmacy in [place].  He said that the shipping material and the drug packaging was clearing different from the formulation he had previously been using without difficulty.    03/29/2013 ROV> [name] is doing well. He hasn't had too much shortness of breath unless he is working "real hard". Spiriva didn't help.  He hasn't used his pulmicort in the last two weeks.  He has been push mowing the yard  and walking regularly.  He is currently on doxycycline for the cellulitis and it seems to be helping.  09/02/2013 ROV >> [name] has been having more shortness of breath lately with bending over and when he is outside walking.  Yesterday while walking in the snow he has had shortness of breath. His BP has been falling blood pressure at times with associated dizziness.  Sometimes it has been as low as 60 or 70 systolic.  He has also noted feeling more forgetful lately.  He really feels like something has changed.  He has lost 20 lbs since the last visit.   >CARD ov , added  Brovana   11/03/2013 Acute OV  Complains of increased SOB with some left sided pain, prod cough with brown mucus for 3 months . Stopped taking Brovana, occasionally takes wife's Spiriva and symbicort. Advised this is not a good practice.  Taking Budesonide Neb Twice daily but not every day. . Not sure he can afford for long.  Cant afford Brovana . Rx was sent to pharm and not DME.  Denies f/c/s, hemoptysis, nausea, vomiting Last visit with same symptoms. PFT showed mild COPD. CXR showed chronic changes. Sputum for AFB neg.  Did not go to cardiology as recommended last ov.  No syncope or palpitations.  Gets chest tightness on/off.  No fever, hemoptysis, orthopnea, edema or calf pain.    Review of Systems Constitutional:   No  weight loss, night sweats,  Fevers, chills, + fatigue, or  lassitude.  HEENT:   No headaches,  Difficulty swallowing,  Tooth/dental problems, or  Sore throat,                No sneezing, itching, ear ache,  +nasal congestion, post nasal drip,   CV:  No    Orthopnea, PND, swelling in lower extremities, anasarca, dizziness, palpitations, syncope.   GI  No heartburn, indigestion, abdominal pain, nausea, vomiting, diarrhea, change in bowel habits, loss of appetite, bloody stools.   Resp:    No chest wall deformity  Skin: no rash or lesions.  GU: no dysuria, change in color of urine, no urgency or frequency.  No flank pain, no hematuria   MS:  No joint pain or swelling.  No decreased range of motion.  No back pain.  Psych:  No change in mood or affect. No depression or anxiety.  No memory loss.         Objective:   Physical Exam GEN: A/Ox3; pleasant , NAD   HEENT:  [place]/AT,  EACs-clear, TMs-wnl, NOSE-clear, THROAT-clear, no lesions, no postnasal drip or exudate noted.   NECK:  Supple w/ fair ROM; no JVD; normal carotid impulses w/o bruits; no thyromegaly or nodules palpated; no lymphadenopathy.  RESP  Diminshed BS in bases w/ no wheezing .no accessory  muscle use, no dullness to percussion  CARD:  RRR, no m/r/g  , no peripheral edema, pulses intact, no cyanosis or clubbing.  GI:   Soft & nt; nml bowel sounds; no organomegaly or masses detected.  Musco: Warm bil, no deformities or joint swelling noted.   Neuro: alert, no focal deficits noted.    Skin: Warm, no lesions or rashes         Assessment & Plan:

## 2013-11-04 ENCOUNTER — Telehealth: Payer: Self-pay | Admitting: Pulmonary Disease

## 2013-11-04 NOTE — Telephone Encounter (Signed)
lmtcb x1 w/ pt spouse

## 2013-11-04 NOTE — Telephone Encounter (Signed)
Pt returned call

## 2013-11-04 NOTE — Telephone Encounter (Signed)
Called and spoke with pt and he stated that he told Kim from [facility] not to send out the brovanna to him.  He stated that he was advised at his last ov that he could mix all 3 meb meds together and he did that today and he stated that he started to feel dizzy, had a headache, and felt shaky.  He stated that this was the first time he has used the brovanna and he does not want to use this again.    He also stated that he has a question for BQ.  He stated that his last sputum culture was negative and he is wanting to know if he needs any further testing done on the nodules that keep growing on his lungs.  Pt stated that he has been through a lot already and wanted to see if there is anything else that needs to be done.  BQ pt is aware that we will call him back Monday with recs.  Please advise. Thanks  Allergies  Allergen Reactions  . Other     Pt states that inhaled meds make him depressed and have negative thoughts    Current Outpatient Prescriptions on File Prior to Visit  Medication Sig Dispense Refill  . arformoterol (BROVANA) 15 MCG/2ML NEBU Take 2 mLs (15 mcg total) by nebulization 2 (two) times daily.  120 mL  6  . aspirin 325 MG tablet Take 325 mg by mouth daily.      . budesonide (PULMICORT) 0.25 MG/2ML nebulizer solution Take 2 mLs (0.25 mg total) by nebulization as needed.  60 mL  5  . CLONAZEPAM PO 1.5 mg at night and 0.75mg  in the morning      . doxycycline (VIBRAMYCIN) 100 MG capsule Take 100 mg by mouth 2 (two) times daily.      Marland Kitchen ipratropium-albuterol (DUONEB) 0.5-2.5 (3) MG/3ML SOLN Take 3 mLs by nebulization every 6 (six) hours as needed.      Marland Kitchen levothyroxine (SYNTHROID, LEVOTHROID) 125 MCG tablet Take 125 mcg by mouth daily.      Marland Kitchen losartan (COZAAR) 50 MG tablet Take 1 tablet (50 mg total) by mouth daily.  30 tablet  11  . metFORMIN (GLUCOPHAGE) 500 MG tablet Take 500 mg by mouth 2 (two) times daily with a meal.      . omeprazole (PRILOSEC) 40 MG capsule Take 40 mg by mouth 2 (two)  times daily.      Marland Kitchen PARoxetine (PAXIL) 20 MG tablet Take 20 mg by mouth 3 (three) times daily.      . simvastatin (ZOCOR) 80 MG tablet Take 80 mg by mouth at bedtime.      . tamsulosin (FLOMAX) 0.4 MG CAPS Take 0.4 mg by mouth daily.        No current facility-administered medications on file prior to visit.

## 2013-11-07 NOTE — Telephone Encounter (Signed)
Called and spoke w/ pt. Made aware. Nothing further needed

## 2013-11-07 NOTE — Telephone Encounter (Signed)
His next scheduled appointment for a CT is 02/2014

## 2013-11-17 ENCOUNTER — Ambulatory Visit: Payer: Self-pay | Admitting: Cardiology

## 2013-12-15 ENCOUNTER — Emergency Department: Payer: Self-pay

## 2013-12-15 LAB — BASIC METABOLIC PANEL
Anion Gap: 0 — ABNORMAL LOW (ref 7–16)
BUN: 17 mg/dL (ref 7–18)
CALCIUM: 9.1 mg/dL (ref 8.5–10.1)
CHLORIDE: 103 mmol/L (ref 98–107)
CREATININE: 0.93 mg/dL (ref 0.60–1.30)
Co2: 33 mmol/L — ABNORMAL HIGH (ref 21–32)
EGFR (African American): >60
Glucose: 79 mg/dL (ref 65–99)
OSMOLALITY: 272 (ref 275–301)
POTASSIUM: 4.6 mmol/L (ref 3.5–5.1)
SODIUM: 136 mmol/L (ref 136–145)

## 2013-12-15 LAB — CBC
HCT: 36.5 % — ABNORMAL LOW (ref 40.0–52.0)
HGB: 11.9 g/dL — AB (ref 13.0–18.0)
MCH: 28.1 pg (ref 26.0–34.0)
MCHC: 32.7 g/dL (ref 32.0–36.0)
MCV: 86 fL (ref 80–100)
Platelet: 181 10*3/uL (ref 150–440)
RBC: 4.25 10*6/uL — AB (ref 4.40–5.90)
RDW: 16.1 % — AB (ref 11.5–14.5)
WBC: 6.9 10*3/uL (ref 3.8–10.6)

## 2013-12-15 LAB — TROPONIN I
Troponin-I: <0.02 ng/mL
Troponin-I: <0.02 ng/mL

## 2014-01-17 ENCOUNTER — Inpatient Hospital Stay: Payer: Self-pay | Admitting: Internal Medicine

## 2014-01-17 LAB — COMPREHENSIVE METABOLIC PANEL
ALBUMIN: 3.2 g/dL — AB (ref 3.4–5.0)
ALT: 18 U/L (ref 12–78)
Alkaline Phosphatase: 92 U/L
Anion Gap: 1 — ABNORMAL LOW (ref 7–16)
BILIRUBIN TOTAL: 0.4 mg/dL (ref 0.2–1.0)
BUN: 12 mg/dL (ref 7–18)
CHLORIDE: 101 mmol/L (ref 98–107)
CREATININE: 0.93 mg/dL (ref 0.60–1.30)
Calcium, Total: 8.9 mg/dL (ref 8.5–10.1)
Co2: 35 mmol/L — ABNORMAL HIGH (ref 21–32)
EGFR (African American): >60
EGFR (Non-African Amer.): >60
Glucose: 98 mg/dL (ref 65–99)
Osmolality: 274 (ref 275–301)
POTASSIUM: 4.3 mmol/L (ref 3.5–5.1)
SGOT(AST): 17 U/L (ref 15–37)
Sodium: 137 mmol/L (ref 136–145)
Total Protein: 7.9 g/dL (ref 6.4–8.2)

## 2014-01-17 LAB — CBC WITH DIFFERENTIAL/PLATELET
BASOS ABS: 0 10*3/uL (ref 0.0–0.1)
Basophil %: 0.5 %
EOS PCT: 1.8 %
Eosinophil #: 0.1 10*3/uL (ref 0.0–0.7)
HCT: 37.7 % — AB (ref 40.0–52.0)
HGB: 12.1 g/dL — ABNORMAL LOW (ref 13.0–18.0)
Lymphocyte #: 0.9 10*3/uL — ABNORMAL LOW (ref 1.0–3.6)
Lymphocyte %: 14.2 %
MCH: 27 pg (ref 26.0–34.0)
MCHC: 32.1 g/dL (ref 32.0–36.0)
MCV: 84 fL (ref 80–100)
Monocyte #: 0.4 x10 3/mm (ref 0.2–1.0)
Monocyte %: 6.9 %
NEUTROS ABS: 4.8 10*3/uL (ref 1.4–6.5)
NEUTROS PCT: 76.6 %
PLATELETS: 181 10*3/uL (ref 150–440)
RBC: 4.48 10*6/uL (ref 4.40–5.90)
RDW: 16.4 % — AB (ref 11.5–14.5)
WBC: 6.3 10*3/uL (ref 3.8–10.6)

## 2014-01-17 LAB — URINALYSIS, COMPLETE
BLOOD: NEGATIVE
Bacteria: NONE SEEN
Bilirubin,UR: NEGATIVE
Glucose,UR: NEGATIVE mg/dL (ref 0–75)
Ketone: NEGATIVE
Leukocyte Esterase: NEGATIVE
Nitrite: NEGATIVE
PH: 7 (ref 4.5–8.0)
PROTEIN: NEGATIVE
RBC,UR: 1 /HPF (ref 0–5)
Specific Gravity: 1.008 (ref 1.003–1.030)
Squamous Epithelial: NONE SEEN

## 2014-01-17 LAB — TROPONIN I
Troponin-I: <0.02 ng/mL
Troponin-I: <0.02 ng/mL

## 2014-01-17 LAB — PROTIME-INR
INR: 2.3
Prothrombin Time: 24.4 secs — ABNORMAL HIGH (ref 11.5–14.7)

## 2014-01-17 LAB — SEDIMENTATION RATE: Erythrocyte Sed Rate: 7 mm/hr (ref 0–20)

## 2014-01-17 LAB — TSH: Thyroid Stimulating Horm: 2.56 u[IU]/mL

## 2014-01-18 LAB — CBC WITH DIFFERENTIAL/PLATELET
Basophil #: 0 10*3/uL (ref 0.0–0.1)
Basophil %: 0.6 %
EOS PCT: 3.4 %
Eosinophil #: 0.2 10*3/uL (ref 0.0–0.7)
HCT: 37.1 % — AB (ref 40.0–52.0)
HGB: 11.8 g/dL — ABNORMAL LOW (ref 13.0–18.0)
LYMPHS ABS: 1 10*3/uL (ref 1.0–3.6)
LYMPHS PCT: 20.3 %
MCH: 26.9 pg (ref 26.0–34.0)
MCHC: 31.8 g/dL — AB (ref 32.0–36.0)
MCV: 85 fL (ref 80–100)
MONOS PCT: 9.3 %
Monocyte #: 0.5 x10 3/mm (ref 0.2–1.0)
Neutrophil #: 3.2 10*3/uL (ref 1.4–6.5)
Neutrophil %: 66.4 %
Platelet: 167 10*3/uL (ref 150–440)
RBC: 4.39 10*6/uL — AB (ref 4.40–5.90)
RDW: 16.5 % — AB (ref 11.5–14.5)
WBC: 4.9 10*3/uL (ref 3.8–10.6)

## 2014-01-18 LAB — PROTIME-INR
INR: 1.9
Prothrombin Time: 21.1 secs — ABNORMAL HIGH (ref 11.5–14.7)

## 2014-01-18 LAB — BASIC METABOLIC PANEL
Anion Gap: 4 — ABNORMAL LOW (ref 7–16)
BUN: 12 mg/dL (ref 7–18)
CO2: 33 mmol/L — AB (ref 21–32)
Calcium, Total: 8.9 mg/dL (ref 8.5–10.1)
Chloride: 103 mmol/L (ref 98–107)
Creatinine: 1.05 mg/dL (ref 0.60–1.30)
EGFR (African American): >60
EGFR (Non-African Amer.): >60
Glucose: 102 mg/dL — ABNORMAL HIGH (ref 65–99)
Osmolality: 279 (ref 275–301)
POTASSIUM: 4.7 mmol/L (ref 3.5–5.1)
Sodium: 140 mmol/L (ref 136–145)

## 2014-02-20 ENCOUNTER — Encounter: Payer: Self-pay | Admitting: Pulmonary Disease

## 2014-02-20 ENCOUNTER — Encounter (INDEPENDENT_AMBULATORY_CARE_PROVIDER_SITE_OTHER): Payer: Self-pay

## 2014-02-20 ENCOUNTER — Ambulatory Visit (INDEPENDENT_AMBULATORY_CARE_PROVIDER_SITE_OTHER): Payer: Medicare Other | Admitting: Pulmonary Disease

## 2014-02-20 VITALS — BP 158/88 | HR 66 | Ht 70.0 in | Wt 192.0 lb

## 2014-02-20 DIAGNOSIS — I82402 Acute embolism and thrombosis of unspecified deep veins of left lower extremity: Secondary | ICD-10-CM

## 2014-02-20 DIAGNOSIS — I82409 Acute embolism and thrombosis of unspecified deep veins of unspecified lower extremity: Secondary | ICD-10-CM

## 2014-02-20 DIAGNOSIS — R918 Other nonspecific abnormal finding of lung field: Secondary | ICD-10-CM

## 2014-02-20 DIAGNOSIS — J411 Mucopurulent chronic bronchitis: Secondary | ICD-10-CM

## 2014-02-20 DIAGNOSIS — R911 Solitary pulmonary nodule: Secondary | ICD-10-CM

## 2014-02-20 DIAGNOSIS — T17908S Unspecified foreign body in respiratory tract, part unspecified causing other injury, sequela: Secondary | ICD-10-CM

## 2014-02-20 DIAGNOSIS — Z86718 Personal history of other venous thrombosis and embolism: Secondary | ICD-10-CM | POA: Insufficient documentation

## 2014-02-20 NOTE — Progress Notes (Signed)
Subjective:    Patient ID: John Mendoza, male    DOB: Sep 08, 1945, 68 y.o.   MRN: [id number]  Synopsis: [name] first saw the [facility] in July 2013 for evaluation of shortness of breath. He smoked 2 packs of cigarettes daily for 35 years and quit in 1996 when he had a three-vessel coronary artery bypass graft. In 1999 he developed throat cancer and underwent a radical neck dissection and 36 days of radiation therapy at the [facility]. In 2013 he developed worsening cough and shortness of breath and had a barium swallow performed at [facility] showing pharyngeal dysphasia.  HPI  02/20/2014 ROV > [place] had a bad case of gout and had to go to the emergency room because of abad case of gout in his right foot.  He went to the Emergency room and had an ultrasound of his leg and he was found to have a blood clot in his right leg.  This was back in July about 6-8 weeks ago.   Despite all this he has been active in his garden.  He continues to have a hard time breathing when he is out in the heat.   He is still using the pulmicort and the ipratropium twice a day.  Dr. Satira Anis recently asked him to start drinking more water.    Past Medical History  Diagnosis Date  . Barrett esophagus   . Hypertension   . Hyperlipidemia   . Depression   . Multiple pulmonary nodules   . CHF (congestive heart failure)   . Lumbar spinal stenosis   . S/P CABG (coronary artery bypass graft)   . COPD (chronic obstructive pulmonary disease)   . CAD (coronary artery disease)   . Basal cell carcinoma   . Diabetes mellitus   . Cancer of neck   . Heart attack      Review of Systems  Constitutional: Negative for chills and fatigue.  HENT: Negative for congestion, nosebleeds and postnasal drip.   Respiratory: Negative for cough, shortness of breath and wheezing.   Cardiovascular: Negative for chest pain, palpitations and leg swelling.       Objective:   Physical Exam   Filed Vitals:   02/20/14 1545  BP: 158/88  Pulse: 66  Height: 5\' 10"  (1.778 m)  Weight: 192 lb (87.091 kg)  SpO2: 94%  RA  Gen: well appearing, no acute distress HEENT: NCAT, PERRL, s/p radical neck dissection, mucous membranes dry PULM: Insp crackles throughout R and left base clear otherwise no wheezing CV: RRR, no mgr, no JVD AB: BS+, soft, nontender, no hsm Ext: warm, no edema, no clubbing, no cyanosis  August 2012 CT chest [facility]: (My read) Left basilar groundglass opacity most consistent with an infectious process scattered nodules all less than 1 cm throughout both lobes. Emphysema noted some tree in bed opacities noted.  June 2013 CT chest [facility] colon (my read) the left basilar groundglass opacity seen on the August 2012 Mendoza is no longer present. There is emphysema noted throughout and the the quality of the Mendoza on my computer is limited; I appreciate tree in bud opacities in the bases bilaterally. There are also multiple scattered nodules in both lungs. There is one pleural-based nodule in the left lung which unfortunately I am unable to see completely which I measure at 1.6 cm.  01/2012 sputum AFB positive for MAI in 1 of 3 cultures  04/15/2012 CT chest [facility] >> no lymphadenopathy, no change in pulmonary  nodules; LLL bronchiectasis, likely aspiration  11/15/2012 CT chest [facility]> multiple new subcentimeter pulm nodules in the LLL, indeterminate but likely infectious vs aspiration; slighly increased LLL consodlidation likely due to chronic aspiration; diverticulosis but no diverticulitis; 2.4 cm lesion left lower lobe  02/2013 PET CT > no PET positive lesions identified; some consolidation noted in left lung as prior studies  06/2013 CT Chest > chronic scarring lower lobes, likely aspiration; nodules are unchanged  01/2014 CT chest > 58mm nodule RLL pleural based, scattered scarring and aspiration changes in bases unchanged     Assessment & Plan:   Multiple pulmonary  nodules The scarring in the left base on Tyke's CT chest has not changed. This has been the most worrisome finding in the last several years throughout serial imaging. As noted previously it was not FDG avid on PET scanning in 2014.  At this point on his most recent CT scan we do see a slight increase in the pulmonary nodule in his right lung. It is only slightly increased (2 mm over 8 months time). I believe that always nodules are related to his ongoing aspiration which will never improve.  Plan: -Repeat CT chest in January 2015 with specific focus on the right lower lobe 7 mm nodule -Continue aspiration precautions as best as possible  COPD (chronic obstructive pulmonary disease) Repeat pulmonary function testing in 2015 showed relative stability. He has been intolerant of stronger medicines other than Pulmicort and DuoNeb.  Plan: -Continue DuoNeb and Pulmicort -Flu shot in the fall  DVT (deep venous thrombosis) He had a DVT in his right leg after an episode of gout. Presumably this was provoked by pain and immobility from gout.  Plan: -Warfarin for 6 months    Updated Medication List Outpatient Encounter Prescriptions as of 02/20/2014  Medication Sig  . aspirin 81 MG tablet Take 81 mg by mouth daily.  . budesonide (PULMICORT) 0.25 MG/2ML nebulizer solution Take 2 mLs (0.25 mg total) by nebulization as needed.  Marland Kitchen CLONAZEPAM PO 1.5 mg at night and 0.75mg  in the morning  . doxycycline (VIBRAMYCIN) 100 MG capsule Take 100 mg by mouth daily.   Marland Kitchen ipratropium-albuterol (DUONEB) 0.5-2.5 (3) MG/3ML SOLN Take 3 mLs by nebulization every 6 (six) hours as needed.  . isosorbide mononitrate (IMDUR) 30 MG 24 hr tablet Take 30 mg by mouth daily.  Marland Kitchen levothyroxine (SYNTHROID, LEVOTHROID) 125 MCG tablet Take 125 mcg by mouth daily.  Marland Kitchen losartan (COZAAR) 50 MG tablet Take 100 mg by mouth daily.  . metFORMIN (GLUCOPHAGE) 500 MG tablet Take 500 mg by mouth 2 (two) times daily with a meal.  .  omeprazole (PRILOSEC) 40 MG capsule Take 40 mg by mouth 2 (two) times daily.  Marland Kitchen PARoxetine (PAXIL) 20 MG tablet Take 20 mg by mouth 3 (three) times daily.  . simvastatin (ZOCOR) 80 MG tablet Take 80 mg by mouth at bedtime.  . tamsulosin (FLOMAX) 0.4 MG CAPS Take 0.4 mg by mouth daily.   Marland Kitchen warfarin (COUMADIN) 4 MG tablet Take 4 mg by mouth one time only at 6 PM.  . [DISCONTINUED] losartan (COZAAR) 50 MG tablet Take 1 tablet (50 mg total) by mouth daily.  . [DISCONTINUED] arformoterol (BROVANA) 15 MCG/2ML NEBU Take 2 mLs (15 mcg total) by nebulization 2 (two) times daily.  . [DISCONTINUED] aspirin 325 MG tablet Take 325 mg by mouth daily.     I

## 2014-02-20 NOTE — Assessment & Plan Note (Signed)
The scarring in the left base on John Mendoza's CT chest has not changed. This has been the most worrisome finding in the last several years throughout serial imaging. As noted previously it was not FDG avid on PET scanning in 2014.  At this point on his most recent CT scan we do see a slight increase in the pulmonary nodule in his right lung. It is only slightly increased (2 mm over 8 months time). I believe that always nodules are related to his ongoing aspiration which will never improve.  Plan: -Repeat CT chest in January 2015 with specific focus on the right lower lobe 7 mm nodule -Continue aspiration precautions as best as possible

## 2014-02-20 NOTE — Assessment & Plan Note (Signed)
Repeat pulmonary function testing in 2015 showed relative stability. He has been intolerant of stronger medicines other than Pulmicort and DuoNeb.  Plan: -Continue DuoNeb and Pulmicort -Flu shot in the fall

## 2014-02-20 NOTE — Patient Instructions (Addendum)
We will order a CT scan of your lungs for January 2016 Keep taking your medicines as you are doing We will see you back in January after the CT chest

## 2014-02-20 NOTE — Assessment & Plan Note (Signed)
He had a DVT in his right leg after an episode of gout. Presumably this was provoked by pain and immobility from gout.  Plan: -Warfarin for 6 months

## 2014-03-06 ENCOUNTER — Emergency Department: Payer: Self-pay | Admitting: Emergency Medicine

## 2014-03-06 LAB — PROTIME-INR
INR: 2.4
Prothrombin Time: 25.7 secs — ABNORMAL HIGH (ref 11.5–14.7)

## 2014-06-20 ENCOUNTER — Telehealth: Payer: Self-pay | Admitting: Pulmonary Disease

## 2014-06-20 NOTE — Telephone Encounter (Signed)
Spoke with the pt  He states has ov with BQ 07/12/13 and wants to get the ct chest now  Dr [place] Bells- do you want a limited ct without? Please advise and I can order, thanks!

## 2014-06-21 NOTE — Telephone Encounter (Signed)
Pt is now scheduled for ct 07/10/14 per [facility] [phone]

## 2014-06-21 NOTE — Telephone Encounter (Signed)
Without contrast please 

## 2014-06-21 NOTE — Telephone Encounter (Signed)
Called and spoke to Ethel. Pt called [facility] and is scheduled to have CT Chest wo contrast on 07/10/14 at 1p at [facility].   Will forward to BQ to make aware.

## 2014-06-22 NOTE — Telephone Encounter (Signed)
Ct scheduled.  Nothing further needed.

## 2014-07-10 ENCOUNTER — Ambulatory Visit: Payer: Self-pay | Admitting: Pulmonary Disease

## 2014-07-12 ENCOUNTER — Encounter: Payer: Self-pay | Admitting: Pulmonary Disease

## 2014-07-12 ENCOUNTER — Ambulatory Visit (INDEPENDENT_AMBULATORY_CARE_PROVIDER_SITE_OTHER): Payer: Medicare Other | Admitting: Pulmonary Disease

## 2014-07-12 VITALS — BP 140/74 | HR 65 | Ht 70.0 in | Wt 191.0 lb

## 2014-07-12 DIAGNOSIS — T17908S Unspecified foreign body in respiratory tract, part unspecified causing other injury, sequela: Secondary | ICD-10-CM

## 2014-07-12 DIAGNOSIS — R918 Other nonspecific abnormal finding of lung field: Secondary | ICD-10-CM

## 2014-07-12 DIAGNOSIS — I82402 Acute embolism and thrombosis of unspecified deep veins of left lower extremity: Secondary | ICD-10-CM

## 2014-07-12 DIAGNOSIS — J411 Mucopurulent chronic bronchitis: Secondary | ICD-10-CM

## 2014-07-12 DIAGNOSIS — A310 Pulmonary mycobacterial infection: Secondary | ICD-10-CM

## 2014-07-12 NOTE — Progress Notes (Signed)
Subjective:    Patient ID: John Mendoza, male    DOB: [birth year], 69 y.o.   MRN: [id number]  Synopsis: John Mendoza first saw the [facility] in July 2013 for evaluation of shortness of breath. He smoked 2 packs of cigarettes daily for 35 years and quit in 1996 when he had a three-vessel coronary artery bypass graft. In 1999 he developed throat cancer and underwent a radical neck dissection and 36 days of radiation therapy at the [facility]. In 2013 he developed worsening cough and shortness of breath and had a barium swallow performed at [facility] showing pharyngeal dysphasia.  HPI Chief Complaint  Patient presents with  . Follow-up    review ct chest.  Pt c/o abdominal pain, but pain is more in belly and not in chest.  Has some sob with exertion.  Was diagnosed with Parkinson's Disease by John Mendoza last week.    John Mendoza was just diagnosed with Parkinson's disease and he is now getting scared to drive.  He has been having episodes of confusion as well.  He thinks he is going to get ride of his driver's license.  He says that he continues to cough up a lot which he feels like it is coming from the left lung.  He coughs a lot at night, but most of the cough occurs after eating.  He does not report any trouble breathing.  He is about to start an exercise routine again.  He has been started on warfarin for a recently diagnosed DVT.  He has been taking Parkinson's medications recently.    Past Medical History  Diagnosis Date  . Barrett esophagus   . Hypertension   . Hyperlipidemia   . Depression   . Multiple pulmonary nodules   . CHF (congestive heart failure)   . Lumbar spinal stenosis   . S/P CABG (coronary artery bypass graft)   . COPD (chronic obstructive pulmonary disease)   . CAD (coronary artery disease)   . Basal cell carcinoma   . Diabetes mellitus   . Cancer of neck   . Heart attack      Review of Systems  Constitutional: Negative for chills and fatigue.   HENT: Negative for congestion, nosebleeds and postnasal drip.   Respiratory: Positive for cough. Negative for shortness of breath and wheezing.   Cardiovascular: Negative for chest pain, palpitations and leg swelling.       Objective:   Physical Exam  Filed Vitals:   07/12/14 1359  BP: 140/74  Pulse: 65  Height: 5\' 10"  (1.778 m)  Weight: 191 lb (86.637 kg)  SpO2: 96%  RA  Gen: well appearing, no acute distress HEENT: NCAT, PERRL, s/p radical neck dissection, mucous membranes dry PULM: Insp crackles throughout R and left base clear otherwise no wheezing CV: RRR, no mgr, no JVD AB: BS+, soft, nontender, no hsm Ext: warm, no edema, no clubbing, no cyanosis  August 2012 CT chest [facility]: (My read) Left basilar groundglass opacity most consistent with an infectious process scattered nodules all less than 1 cm throughout both lobes. Emphysema noted some tree in bed opacities noted.  June 2013 CT chest [facility] colon (my read) the left basilar groundglass opacity seen on the August 2012 study is no longer present. There is emphysema noted throughout and the the quality of the study on my computer is limited; I appreciate tree in bud opacities in the bases bilaterally. There are also multiple scattered nodules in both lungs. There  is one pleural-based nodule in the left lung which unfortunately I am unable to see completely which I measure at 1.6 cm.  01/2012 sputum AFB positive for MAI in 1 of 3 cultures  04/15/2012 CT chest [facility]>> no lymphadenopathy, no change in pulmonary nodules; LLL bronchiectasis, likely aspiration  11/15/2012 CT chest [facility]> multiple new subcentimeter pulm nodules in the LLL, indeterminate but likely infectious vs aspiration; slighly increased LLL consodlidation likely due to chronic aspiration; diverticulosis but no diverticulitis; 2.4 cm lesion left lower lobe  02/2013 PET CT > no PET positive lesions identified; some consolidation noted in  left lung as prior studies  06/2013 CT Chest > chronic scarring lower lobes, likely aspiration; nodules are unchanged  01/2014 CT chest > 20mm nodule RLL pleural based, scattered scarring and aspiration changes in bases unchanged  07/2014 CT chest > NO change in basilar atelectasis and nodules, images reviewed by me       Assessment & Plan:   Multiple pulmonary nodules I have personally reviewed the images from the January 2016 CT chest which showed that there has been no significant change in the multiple pulmonary nodules. I feel strongly of these pulmonary nodules are all related to recurrent aspiration.   He and I have talked about his malignancy risk at length and at this point he has received what I believe to be an excessive amount of CT scans over the last several years. We have agreed that we will no longer perform further CT scans in the future unless he develops new pulmonary symptoms.  Aspiration into respiratory tract This is really his biggest pulmonary problem. He continues to try to manage this with using appropriate swallowing techniques as taught by speech therapy.  COPD (chronic obstructive pulmonary disease) This has been a stable interval  Continue Pulmicort and albuterol  DVT (deep venous thrombosis) He was recently diagnosed with an idiopathic DVT. His primary care physician has appropriately recommended that he be prescribed warfarin for life. I agree with this recommendation. However, considering his recent diagnosis of parkinsonism we will need to keep a close eye on his balance and carefully evaluate his fall risk. Today he appears to be quite steady on his feet but we should keep this in mind as he ages.  MAI (mycobacterium avium-intracellulare) He grew out MAI from 1 out of 3 cultures in 2013. He has not shown evidence of severe pulmonary disease so at this point I do not see a need to repeat cultures. We will continue to observe and watch for evidence of  fevers, chills, weight loss, or purulent sputum production.    Updated Medication List Outpatient Encounter Prescriptions as of 07/12/2014  Medication Sig  . aspirin 81 MG tablet Take 81 mg by mouth daily.  . carbidopa-levodopa (SINEMET IR) 25-100 MG per tablet Take 1 tablet by mouth 3 (three) times daily.  Marland Kitchen CLONAZEPAM PO 1.5 mg at night and 0.75mg  in the morning  . doxycycline (VIBRAMYCIN) 100 MG capsule Take 100 mg by mouth daily.   . isosorbide mononitrate (IMDUR) 30 MG 24 hr tablet Take 30 mg by mouth daily.  Marland Kitchen levothyroxine (SYNTHROID, LEVOTHROID) 125 MCG tablet Take 125 mcg by mouth daily.  Marland Kitchen losartan (COZAAR) 50 MG tablet Take 100 mg by mouth daily.  . metFORMIN (GLUCOPHAGE) 500 MG tablet Take 500 mg by mouth 2 (two) times daily with a meal.  . omeprazole (PRILOSEC) 40 MG capsule Take 40 mg by mouth 2 (two) times daily.  Marland Kitchen PARoxetine (  PAXIL) 20 MG tablet Take 20 mg by mouth 3 (three) times daily.  . simvastatin (ZOCOR) 80 MG tablet Take 80 mg by mouth at bedtime.  . tamsulosin (FLOMAX) 0.4 MG CAPS Take 0.4 mg by mouth daily.   Marland Kitchen warfarin (COUMADIN) 4 MG tablet Take 4 mg by mouth one time only at 6 PM.  . budesonide (PULMICORT) 0.25 MG/2ML nebulizer solution Take 2 mLs (0.25 mg total) by nebulization as needed. (Patient not taking: Reported on 07/12/2014)  . ipratropium-albuterol (DUONEB) 0.5-2.5 (3) MG/3ML SOLN Take 3 mLs by nebulization every 6 (six) hours as needed.     I

## 2014-07-12 NOTE — Assessment & Plan Note (Signed)
I have personally reviewed the images from the January 2016 CT chest which showed that there has been no significant change in the multiple pulmonary nodules. I feel strongly of these pulmonary nodules are all related to recurrent aspiration.   He and I have talked about his malignancy risk at length and at this point he has received what I believe to be an excessive amount of CT scans over the last several years. We have agreed that we will no longer perform further CT scans in the future unless he develops new pulmonary symptoms.

## 2014-07-12 NOTE — Assessment & Plan Note (Signed)
He grew out MAI from 1 out of 3 cultures in 2013. He has not shown evidence of severe pulmonary disease so at this point I do not see a need to repeat cultures. We will continue to observe and watch for evidence of fevers, chills, weight loss, or purulent sputum production.

## 2014-07-12 NOTE — Assessment & Plan Note (Signed)
He was recently diagnosed with an idiopathic DVT. His primary care physician has appropriately recommended that he be prescribed warfarin for life. I agree with this recommendation. However, considering his recent diagnosis of parkinsonism we will need to keep a close eye on his balance and carefully evaluate his fall risk. Today he appears to be quite steady on his feet but we should keep this in mind as he ages.

## 2014-07-12 NOTE — Assessment & Plan Note (Signed)
This has been a stable interval  Continue Pulmicort and albuterol

## 2014-07-12 NOTE — Assessment & Plan Note (Signed)
This is really his biggest pulmonary problem. He continues to try to manage this with using appropriate swallowing techniques as taught by speech therapy.

## 2014-07-12 NOTE — Patient Instructions (Signed)
Use the nebulizers regularly Exercise regularly We will see you back in 6 months or sooner if needed

## 2014-08-15 ENCOUNTER — Inpatient Hospital Stay: Payer: Self-pay | Admitting: Internal Medicine

## 2014-10-24 ENCOUNTER — Encounter: Payer: Self-pay | Admitting: Pulmonary Disease

## 2014-10-28 NOTE — H&P (Signed)
PATIENT NAME:  John Mendoza, John Mendoza MR#:  [id number] DATE OF BIRTH:  Apr 27, 1946  DATE OF ADMISSION:  01/17/2014   CHIEF COMPLAINT: Hypertension, hemoptysis,   HISTORY OF PRESENT ILLNESS: The patient with history of hypertension, coronary artery disease with prior bypass surgery, diabetes, congestive heart failure, chronic left-sided systolic, on chronic Coumadin therapy, with progressive shortness of breath, with previous cardiology evaluation and with report of left and right heart catheter recently, results not yet available. He has noticed that his blood pressure was extremely high at 790 to 240 systolic, and this is where his blood pressure is noted today. He has had some headache with this. He has noticed some increasing shortness of breath, and had been having some cough which has been productive of some bloody sputum. Saturation 92% on room air. His repeat blood pressure remained elevated. He is admitted now with accelerated hypertension with hemoptysis. He has also been relating some abdominal pain, some urinary hesitancy as well. These things will need to be evaluated as well.   PAST MEDICAL HISTORY: 1. Hyperlipidemia.  2. Degenerative disk disease.  3. Coronary artery disease with prior bypass surgery.  4. Hypertension.  5. Diabetes mellitus.  6. Anemia.  7. Chronic left-sided systolic congestive heart failure.  8. History of deep vein thrombosis.  9. History of basal cell carcinoma.  10. History of benign prostatic hypertrophy with urinary obstruction.  11. Depression and anxiety.  12. History of throat cancer.  13. Chronic obstructive pulmonary disease.  14. Leukopenia. 15. Gastroesophageal reflux disease.   ALLERGIES: No known drug allergies.   MEDICATIONS: 1. DuoNeb SVN q.6 hours as needed.  2. Aspirin 325 mg p.o. daily.  3. Bisoprolol 10 mg p.o. daily.  4. Clonazepam 1 mg in the morning 0.5 mg at night.  5. Cyclobenzaprine 10 mg p.o. t.i.d. p.r.n.  6. Levothyroxine 0.137 mg  p.o. daily.  7. Losartan 50 mg p.o. daily.  8. Metformin 500 mg p.o. b.i.d.  9. Multivitamin p.o. daily.  10. Nitroglycerin 0.4 mg sublingually every five minutes x 3 p.r.n. chest pain.  11. Omeprazole 20 mg p.o. daily.  12. Paroxetine 20 mg p.o. daily.  13. Simvastatin 80 mg p.o. daily.  14. Tamsulosin 0.4 mg p.o. at bedtime.  15. Coumadin 4 mg p.o. daily.   SOCIAL HISTORY: Remote tobacco. Remote alcohol.   FAMILY HISTORY: Coronary artery disease.   REVIEW OF SYSTEMS: Please see history of present illness. No fevers but some chills. Very poor energy. Hemoptysis is noted above without significant wheezing. The remainder of complete review of systems is negative.   PHYSICAL EXAMINATION: VITAL SIGNS: Weight 188, height 5 feet 8 inches, sats 92% on room air, pulse 53, blood pressure 200/90.  GENERAL: Well-developed, well-nourished male, appears uncomfortable.  EYES: Pupils round and reactive to light. Lids and conjunctivae unremarkable.  HEAD: Pustular rash over the back of the head, improved.  EARS, NOSE AND THROAT: External examination unremarkable. Oropharynx moist without lesions.  NECK: Postoperative changes to the trachea in the midline.  CARDIOVASCULAR: Regular rate and rhythm without gallops or rubs. Carotid and radial pulses 2+.  LUNGS: Crackles are heard throughout the lung fields without retractions.  ABDOMEN: Soft, tender to deep palpation without guard, rebound, mass.   SKIN: Rashes noted above without other significant rashes or nodules.  LYMPH NODES: No cervical or supraclavicular nodes.  MUSCULOSKELETAL: No clubbing, cyanosis or significant edema. Postoperative changes in the left leg.  NEUROLOGIC: Cranial nerves appear intact. Motor strength appears to be symmetrical.  IMPRESSION AND PLAN: 1. Accelerated hypertension. Continue bisoprolol, add nitro paste. Increase losartan. He has had episodes where he feels like his blood pressure drops after DuoNebs, so we will need  to watch this closely.  2. Hemoptysis, certainly may be related to the cough plus Coumadin, but we will need to make sure he does not got any other pathology contributing to the above. Will get CT of the chest, and will extend this to the abdomen and pelvis given the patient's complaints of abdominal pain and variability in blood pressure with bending forward.  3. Diabetes mellitus. Hold metformin given CT scan. Cover with sliding scale insulin.  4. Hyperlipidemia. Continue on simvastatin.  5. Arthritis. Pain control as able.  6. Anxiety/depression. Continue current medications, increasing clonazepam to 1 mg b.i.d., watch for sedation.  7. Benign prostatic hypertrophy, continue Flomax.   ____________________________ Adin Hector, MD [id number] D: 01/17/2014 09:55:49 ET T: 01/17/2014 10:24:21 ET JOB#: [id number]  cc: Tama High III, MD, <Dictator> Ramonita Lab MD ELECTRONICALLY SIGNED 01/18/2014 7:53

## 2014-10-28 NOTE — Discharge Summary (Signed)
PATIENT NAME:  John Mendoza, John Mendoza MR#:  [id number] DATE OF BIRTH:  1945-11-27  DATE OF ADMISSION:  01/17/2014. DATE OF DISCHARGE:  01/18/2014.  FINAL DIAGNOSES:  1.  Acute on chronic left-sided systolic congestive heart failure.  2.  Accelerated hypertension.  3.  Hemoptysis secondary to #1.  4.  Coronary artery disease with prior bypass surgery; cardiac catheterization in May revealing multivessel coronary artery disease with medical management recommended.  5.  Adult onset diabetes mellitus.  6.  Chronic shortness of breath.  7.  Degenerative disk disease.  8.  Hypothyroidism.  9.  Anemia.  10. History of deep vein thrombosis May 2015.  11. Anxiety and depression.  12. History of throat cancer.  13. Chronic obstructive pulmonary disease.   HISTORY AND PHYSICAL: Please see dictated admission history and physical.   HOSPITAL COURSE: The patient was admitted with accelerated hypertension, with 2 brief episodes of hemoptysis, small amount. He underwent CT chest, abdomen and pelvis, which revealed some pulmonary fibrosis and small pulmonary nodules, not significantly changed from CT from 2 months ago. This was reviewed directly with pulmonology and felt to represent some element of edema as the majority of this appeared to be in the lower lung lobes and some atelectasis was also noted. His recent cardiac catheterization also revealed an LVEF of 40%. Blood pressure medications were adjusted with reasonable blood pressure control. It is noted that progressive blood pressure lowering led to hypotension and so his baseline blood pressure will likely need to run slightly higher than typical in order to prevent hypotension. It was felt that a portion of this was likely due to his coronary artery disease and he may, in fact, have some chronic stable angina.   The patient ambulated with physical therapy and did very well, going 560 feet with no assistive device and no oxygen. His blood pressure was low at  times but improved rapidly with some fluid, and he did not feel he had any major symptoms with this.   At this time, he will be discharged home in stable condition with his physical activity to be up as tolerated. He should follow a 2-gram sodium diet. He should check his blood pressure twice a day and record this, and check his glucose daily and record this, as well. He will follow up in our office in 1 to 2 weeks. He was advised to weigh himself daily, calling for more than a 2-pound gain in 1 day or 5 pounds in 1 week or any increasing signs, symptoms of heart failure.   He had no further hemoptysis. His INR was therapeutic and his Coumadin was continued. He was placed on low dose antibiotics and was continued on doxycycline for a rash on the back of his scalp. Interactions with Coumadin have been addressed and we will follow his INR at followup. Of note, he had an elevated antiglomerular basement membrane antibody level, with ANCA level pending. However, sedimentation rate was normal and the patient had no evidence of proteinuria. At this point this may represent false positive, but will warrant further followup. We will also arrange followup CT scanning to monitor pulmonary nodules as an outpatient.    ____________________________ Adin Hector, MD bjk:lt D: 01/18/2014 13:20:42 ET T: 01/18/2014 14:29:11 ET JOB#: [id number]  cc: Tama High III, MD, <Dictator> Ramonita Lab MD ELECTRONICALLY SIGNED 01/26/2014 8:22

## 2014-10-28 NOTE — Consult Note (Signed)
PATIENT NAME:  John Mendoza, John Mendoza MR#:  [id number] DATE OF BIRTH:  [birth year]  DATE OF CONSULTATION:  12/15/2013  REFERRING PHYSICIAN:  Dr. Robet Leu CONSULTING PHYSICIAN:  Monette Omara R. Zanylah Hardie, MD  PRIMARY CARE PROVIDER:  Dr. Ramonita Lab.  REASON FOR CONSULTATION:  Deep vein thrombosis, chest pain.   HISTORY OF PRESENT ILLNESS:  A 69 year old Caucasian male patient with history of CAD, status post CABG, COPD, hypertension, presents to the Emergency Room complaining of right lower extremity pain.  The patient while in the Emergency Room also developed some chest pain and the hospitalist team has been consulted.  The patient's venous Doppler of the right lower extremity has shown a nonocclusive small popliteal DVT.   The patient mentions that he does have chronic chest pain which lasts about 5 to 10 minutes, can come on at rest and resolves with nitro.  The patient had a cardiac catheterization done on 11/17/2013 for this chronic pain by Dr. Ubaldo Glassing, showed advanced disease and not amenable to intervention and medical management was advised.  The patient's pain has resolved at this time.  He did have blood pressure elevated significantly at 205/115 on arrival, presently is much improved at 159/83.    He has also been diagnosed with bilateral pulmonary nodules with possible chronic aspiration along with Barrett's esophagus.   Presently, he is pain-free after pain medications.  A CTA of the chest to rule out pulmonary embolism has been ordered and is pending, although the patient has not been tachycardic or hypoxic here in the Emergency Room.  His initial set of troponin is normal.   PAST MEDICAL HISTORY: 1.  Hypertension.  2.  CAD, status post CABG and cardiac cath in May 2015.  3.  Diabetes mellitus.  4.  Hypothyroidism.  5.  Throat cancer.  6.  Barrett's esophagus.  7.  Diverticulosis.  8.  Depression.  9.  Arthritis.   ALLERGIES:  No known drug allergies.   SOCIAL HISTORY:  The patient does not  smoke.  No alcohol.  No illicit drugs.  Lives at home with his wife.  Ambulates on his own.   CODE STATUS:  FULL CODE.   FAMILY HISTORY:  A grandmother had leukemia.  Father had hypertension, CAD and CVA.   HOME MEDICATIONS: 1.  Norvasc 10 mg daily.  2.  Aspirin 325 mg daily.  3.  Bisoprolol 10 mg oral once a day.  4.  Clonazepam 0.5 mg oral once a day.  5.  Flomax 0.4 mg daily.  6.  Levothyroxine 125 mcg daily.  7.  Losartan 50 mg daily.  8.  Metformin 500 mg oral 2 times a day.  9.  Omeprazole 20 mg 2 tablets daily.  10.  Paroxetine 20 mg 3 tablets daily.  11.  Simvastatin 80 mg daily.    REVIEW OF SYSTEMS:  CONSTITUTIONAL:  No fever, fatigue, weakness.  EYES:  No blurred vision, pain, redness.  EARS, NOSE, THROAT:  No tinnitus, ear pain, hearing loss.  RESPIRATORY:  Has a chronic cough, COPD, not on home oxygen.  CARDIOVASCULAR:  Has chronic stable angina.  No edema or orthopnea.  GASTROINTESTINAL:  No nausea, vomiting, diarrhea, abdominal pain.  GENITOURINARY:  No dysuria, hematuria, frequency.  ENDOCRINE:  No polyuria, nocturia or thyroid problems.  HEMATOLOGIC AND LYMPHATIC:  No anemia, easy bruising, bleeding.  INTEGUMENTARY:  No acne, rash, lesion.  MUSCULOSKELETAL:  Has arthritis.  NEUROLOGIC:  No focal numbness, weakness, seizure.  PSYCHIATRIC:  No anxiety or depression.  PHYSICAL EXAMINATION: VITAL SIGNS:  Temperature 97.8, pulse 80.  Initially blood pressure 205/115, presently 184/80.  Saturating 93% on room air.  GENERAL:  Obese, Caucasian male patient lying in bed, seems comfortable, conversational, cooperative with exam.  PSYCHIATRIC:  Alert and oriented x 3.  Mood and affect appropriate.  Judgment intact.  HEENT:  Atraumatic, normocephalic.  Oral mucosa moist and pink.  External ears and nose normal.  No pallor.  No icterus.  Pupils bilaterally equal and reactive to light. NECK:  Supple.  No thyromegaly or palpable lymph nodes.  Trachea midline.  No carotid  bruit, JVD.  CARDIOVASCULAR:  S1, S2, without any murmurs.  No chest wall tenderness.  Has a scar from prior CABG.  No edema.  RESPIRATORY:  Has bilateral crackles all over.  GASTROINTESTINAL:  Soft abdomen, nontender.  Bowel sounds present.  No hepatosplenomegaly palpable.  SKIN:  Warm and dry.  No petechiae, rashes or ulcers.  MUSCULOSKELETAL:  No joint swelling, redness or effusion in large joints.  Normal muscle tone.  NEUROLOGICAL:  Motor strength 5 out of 5 in upper and lower extremities.  Sensation is intact all over. LYMPHATIC:  No cervical lymphadenopathy.   LABORATORY STUDIES:  Show glucose of 79, BUN 14, creatinine 0.93, sodium 136, potassium 4.6.  Troponin less than 0.02.    WBC 6.9, hemoglobin 11.9, platelets of 181.   EKG shows normal sinus rhythm, nonspecific ST-T wave changes and occasional PVC.   CT scan of the head without contrast shows atrophy, nothing acute.   Lower extremity venous Doppler shows nonocclusive right lower extremity popliteal DVT over a very short segment.   Chest x-ray PA and lateral shows chronic lung changes, nothing acute.   ASSESSMENT AND PLAN: 1.  Right lower extremity popliteal nonocclusive deep vein thrombosis.  I have discussed the case with Emergency Room physician, Dr. Robet Leu.  The patient will be given a dose of Xarelto here in the hospital and can be discharged home on Xarelto and follow up with primary care physician.  He did complain of some chest pain, is presently getting a CTA of his chest to rule out pulmonary embolism.  This seems low risk since he has no tachycardia, no hypoxia, but does have acute deep vein thrombosis.  The patient seems to have chronic stable angina for which he has already had a cardiac catheterization two weeks prior and medical management has been advised.  Presently plan is to check another cardiac enzyme in four hours.  If this is negative the patient can be discharged home after having a normal cardiac enzyme  test and negative CTA for pulmonary embolism.  2.  Uncontrolled hypertension.  We will give a dose of hydralazine 10 mg intravenous at this time and start him on hydrochlorothiazide 12.5 mg daily.  The patient mentions that he has had episodes of hypotension in the past and has labile blood pressure.  He will follow up with his primary care physician.  3.  Diabetes mellitus.  Continue medications.   I have discussed this case with Emergency Room physician, Dr. Robet Leu, who will be following up on the results of the troponin and CTA of the chest and discharge the patient home if these are negative.  Thank you for the consult.   Total time spent on this case today was 40 minutes.     ____________________________ Leia Alf Kelvin Burpee, MD srs:ea D: 12/15/2013 21:35:49 ET T: 12/16/2013 00:24:28 ET JOB#: [id number]  cc: Alveta Heimlich R. Isyss Espinal, MD, <Dictator> Javier Docker.  Ubaldo Glassing, MD Adin Hector, MD Neita Carp MD ELECTRONICALLY SIGNED 12/16/2013 13:25

## 2014-10-29 NOTE — Op Note (Signed)
PATIENT NAME:  John Mendoza, John Mendoza MR#:  [id number] DATE OF BIRTH:  [birth year]  DATE OF PROCEDURE:  12/09/2011  PREOPERATIVE DIAGNOSIS: Visually significant cataract of the right eye.   POSTOPERATIVE DIAGNOSIS: Visually significant cataract of the right eye.   OPERATIVE PROCEDURE: Cataract extraction by phacoemulsification with implant of intraocular lens to right eye.   SURGEON: Birder Robson, MD.   ANESTHESIA:  1. Managed anesthesia care.  2. Topical tetracaine drops followed by 2% Xylocaine jelly applied in the preoperative holding area.   COMPLICATIONS: None.   TECHNIQUE:  Stop-and-chop    DESCRIPTION OF PROCEDURE: The patient was examined and consented in the preoperative holding area where the aforementioned topical anesthesia was applied to the right eye and then brought back to the Operating Room where the right eye was prepped and draped in the usual sterile ophthalmic fashion and a lid speculum was placed. A paracentesis was created with the side port blade and the anterior chamber was filled with viscoelastic. A near clear corneal incision was performed with the steel keratome. A continuous curvilinear capsulorrhexis was performed with a cystotome followed by the capsulorrhexis forceps. Hydrodissection and hydrodelineation were carried out with BSS on a blunt cannula. The lens was removed in a stop-and-chop technique and the remaining cortical material was removed with the irrigation-aspiration handpiece. The capsular bag was inflated with viscoelastic and the Alcon SN60WF 17.5-diopter lens, serial number [id number] was placed in the capsular bag without complication. The remaining viscoelastic was removed from the eye with the irrigation-aspiration handpiece. The wounds were hydrated. The anterior chamber was flushed with Miostat and the eye was inflated to physiologic pressure. The wounds were found to be water tight. The eye was dressed with Vigamox. The patient was given protective  glasses to wear throughout the day and a shield with which to sleep tonight. The patient was also given drops with which to begin a drop regimen today and will follow-up with me in one day.   ____________________________ Livingston Diones. Alexio Sroka, MD wlp:drc D: 12/09/2011 11:55:36 ET T: 12/09/2011 12:07:51 ET JOB#: [id number]  cc: Dragan Tamburrino L. Harlon Kutner, MD, <Dictator> Livingston Diones Gracelyn Coventry MD ELECTRONICALLY SIGNED 12/10/2011 15:42

## 2014-11-05 NOTE — Discharge Summary (Signed)
PATIENT NAME:  John Mendoza, John Mendoza MR#:  [id number] DATE OF BIRTH:  06-28-1946  DATE OF ADMISSION:  08/15/2014 DATE OF DISCHARGE:  08/18/2014  FINAL DIAGNOSES: 1.  Aspiration pneumonitis.  2.  Acute respiratory failure secondary to #1. 3.  Hemoptysis secondary to #1.  4.  Chronic left-sided systolic congestive heart failure.  5.  Coronary artery disease with prior bypass surgery.  6.  Diabetes mellitus with peripheral neuropathy.  7.  Chronic anemia.  8.  Osteoarthritis.  9.  History of deep vein thrombosis with chronic Coumadin use.  10.  Benign prostatic hypertrophy.  11.  Hyperlipidemia.  12.  Hypertension.  13.  Depression with anxiety.  14.  History of throat cancer with prior treatment.  15.  Chronic obstructive pulmonary disease.  16.  Gastroesophageal reflux disease.  17.  Lumbar spinal stenosis.  18.  Parkinson's.  19.  Seizure disorder.  20.  Elevated antiglomerular basement membrane antibody positive.   HISTORY AND PHYSICAL: Please see dictated admission history and physical.   HOSPITAL COURSE: The patient was admitted with hemoptysis and increasing shortness of breath, sats into the 60 percentile range, which was thought to be from some plugging. He also had gross hemoptysis. CT was performed, which showed no evidence of pulmonary embolism. He was found to have mediastinal lymphadenopathy as well as multilobar pneumonia, and his story was most consistent with aspiration pneumonitis. He was placed on empiric antibiotics, steroids, and antibody levels were sent, to include ANA, which was mildly positive, ANCA, which was negative, and antiglomerular basement membrane antibody, which was high positive. He had been placed on steroids as above, and his hemoptysis actually resolved over the first 24 hours, well before the antibody levels were available.   Speech therapy saw the patient and he underwent modified barium swallow study. Because of the changes in his epiglottis from his  previous radiation therapy, as well as the changes in his neck from previous cervical fusion, he is at high risk for aspiration. He was placed on mechanical soft diet with nectar thick liquids, but needs to cough to clear his throat and had difficulty with chin tuck. These methods were re-enforced to the patient, however, and he was compliant.   His oxygen level did improve, however, on ambulation with physical therapy his saturation fell to 86% on room air, probably improving once placed on oxygen, and at rest it was coming up to 92%, however, he will need home oxygen and this was arranged.   His Coumadin was held initially, and then reinstituted once his hemoptysis resolved, and he was maintained on this throughout his hospitalization. Aspirin was held, but this can be restarted at this point as he has had no further hemoptysis.   The findings of his positive GBM antibody were discussed with nephrology, rheumatology, and pulmonology briefly; pulmonology saw the patient during his hospitalization. Given his rapid improvement on low-dose prednisone, we do not feel that moving to plasmapheresis or cyclophosphamide would be in the patient's best interest and this was discussed with the patient. His urinalysis was fairly bland, with no proteinuria, and so at this point we anticipate repeating the antibody levels to determine whether this was false positive, or whether this is emerging Goodpasture syndrome.   The patient will be discharged to home in stable condition with his physical activity up as tolerated. His diet should be mechanical soft with nectar thick liquids with carbohydrate-controlled diet. He should be on 2 liters nasal cannula oxygen. He will follow up in  our office in 1 to 2 weeks and with Dr. Raul Del in 1 to 2 weeks. Physical therapy, speech therapy and home health nursing were all arranged for the patient. He should check his sugar and record this. He should weigh himself daily, calling for  more than 2 pound gain in one day or 5 pounds in one week or increasing signs or symptoms of heart failure. Echocardiogram was performed during his hospitalization, which revealed LVEF of greater than 40%.   DISCHARGE MEDICATIONS: 1.  Omeprazole 40 mg p.o. b.i.d.  2.  Metformin 500 mg p.o. b.i.d. 3.  Flomax 0.4 mg p.o. at bedtime.  4.  Clonazepam 0.5 mg p.o. in the morning and 1 mg at night.  5.  Bisoprolol 10 mg p.o. daily.  6.  Imdur 30 mg p.o. daily.  7.  Coumadin 5 mg p.o. daily.  8.  Piroxicam 60 mg p.o. daily.  9.  Multivitamin 1 p.o. daily.  10.  Tylenol 1000 mg p.o. b.i.d. as needed for fever.  11.  DuoNebs SVN every 6 hours as needed for wheezing.  12.  Aspirin 325 mg p.o. daily.  13.  Sinemet 25/100 one p.o. t.i.d. for Parkinson's. 14.  Lamictal 25 mg p.o. b.i.d.  15.  Nitroglycerin 0.4 mg sublingually q. 5 minutes x3 p.r.n. chest pain.  16.  Tizanidine 4 mg p.o. q. 8 hours as needed for muscle pain.  17.  Losartan 100 mg p.o. daily.  18.  Levothyroxine 0.137 mg p.o. daily, dose increased.  19.  Prednisone 20 mg p.o. daily, to be maintained until the antibody level is rechecked.  20.  Amlodipine 5 mg p.o. daily.  21.  Ceftin 500 mg p.o. b.i.d. x7 days.  22.  Atorvastatin 40 mg p.o. daily, to replace simvastatin given the introduction of amlodipine.   The patient was given instructions to stop simvastatin, and he no longer needs doxycycline. ____________________________ Adin Hector, MD bjk:sb D: 08/18/2014 13:04:53 ET T: 08/18/2014 14:26:25 ET JOB#: [id number]  cc: Tama High III, MD, <Dictator> Herbon E. Raul Del, MD Ramonita Lab MD ELECTRONICALLY SIGNED 08/21/2014 13:22

## 2014-11-05 NOTE — H&P (Signed)
PATIENT NAME:  John Mendoza, ENBERG MR#:  [id number] DATE OF BIRTH:  [birth year]  DATE OF ADMISSION:  08/15/2014  CHIEF COMPLAINT: Shortness of breath and hemoptysis.    HISTORY OF PRESENT ILLNESS: The patient is a 69 year old male with a history of coronary artery disease with prior bypass surgery, history of diabetes, anemia, prior DVT, heart failure, degenerative disk disease, Parkinson's, on chronic Coumadin therapy. He developed increasing shortness of breath over the last several days to weeks, with dyspnea on exertion. Over the last couple of days, he began coughing up amounts of blood that he states is about palm-sized. He has a home oximeter and checked this and found it to be in the 60s, with more shortness of breath, and was seen in the office today.   Chest x-ray revealed increased interstitial markings bilaterally, but appear to be more prominent on the right side. He has had a bit more trouble swallowing recently. The possibility of aspiration pneumonitis was raised, as well as the possibility of worsening heart failure. Clinically, he does not appear to be particularly fluid overloaded, but he is admitted now with hemoptysis, acute respiratory failure, further diagnosis and treatment.   PAST MEDICAL HISTORY:  1.  Coronary disease with prior bypass surgery.  2.  Diabetes mellitus with neuropathy.  3.  Anemia.  4.  Osteoarthritis.  5.  History of DVT.  6.  History of basal cell carcinoma.  7.  BPH.  8.  Hyperlipidemia.  9.  Hypertension.  10.  Depression with anxiety.  11.  Chronic left-sided systolic congestive heart failure.  12.  History of throat cancer.  13.  COPD.  14.  History of leukopenia. 15.  Gastroesophageal reflux disease.  16.  History of lumbar spinal stenosis.  17.  Parkinson disease.  18.  History of seizure disorder.   ALLERGIES: No known drug allergies.   MEDICATIONS:  1.  Proventil nebulizer q. 6 hours as needed.  2.  Aspirin 325 mg daily.  3.  Zebeta 10 mg  p.o. daily.  4.  Sinemet 25/100 mg 1 p.o. t.i.d.  5.  Clonazepam 0.5 mg in the morning, 1 mg at night.  6.  Imdur 30 mg p.o. daily.  7.  Lamictal 25 mg p.o. daily.  8.  Levothyroxine  0.137 mg p.o. daily.  9.  Losartan 50 mg p.o. daily.  10.  Metformin 500 mg p.o. b.i.d.  11.  Multivitamin, 1 p.o. daily.  12.  Nitroglycerin 0.4 mg sublingually every 5 minutes x 3 p.r.n. chest pain.  13.  Omeprazole 20 mg p.o. daily.  14.  Paxil 20 mg p.o. daily.  15.  Simvastatin 80 mg p.o. at bedtime.  16.  Tamsulosin 0.4 mg p.o. at bedtime.  17.  Zanaflex 4 mg p.o. t.i.d. p.r.n.  18.  Coumadin 5 mg p.o. daily.   SOCIAL HISTORY: No current tobacco or alcohol.   FAMILY HISTORY: Noncontributory.   REVIEW OF SYSTEMS: Please see HPI. No real chest pain. Some chills without fever. The remainder of complete review of systems is negative.   PHYSICAL EXAMINATION:  VITAL SIGNS: Temperature 97.9, weight 193 pounds, pulse 68, blood pressure 140/76, saturation 90% on room air, deteriorating with any movement.  HEENT:  Pupils round and reactive to light. Oropharynx is moist, without lesions.  NECK: Supple. Trachea midline. No thyromegaly.  CARDIOVASCULAR: Regular rate and rhythm with a 2/6 systolic murmur.  LUNGS: Coarse rales heard bilaterally, worse on the right side. No retractions.  ABDOMEN: Soft, nontender, positive bowel  sounds. No guarding or rebound.  EXTREMITIES: No clubbing, cyanosis.  NEUROLOGIC: Cranial nerves appear grossly intact, with the exception of hoarse voice. Decreased light touch in the feet.  DERMATOLOGIC: No significant rashes or nodules.  LYMPH: No cervical or supraclavicular nodes.   IMPRESSION AND PLAN:  1.  Hemoptysis/respiratory failure. Etiologies include the possibility of aspiration pneumonitis, heart failure, autoimmune processes, et Ronney Asters. Setting up CT angiogram to further investigate. Await INR. Checking CBC. Send sputum for culture if able. Pulmonology consultation.  Empiric steroids, antibiotics, diuretics, and nebulized treatments. Further therapy to be based on the above. Send ANCA and anti-GBM antibodies as well as ANA.  2.  Coronary artery disease. Continue Imdur, Zebeta. Reduce losartan. Follow blood pressure, heart rate. Cycle cardiac enzymes. Check echocardiogram. Check BNP.  3.  Chronic obstructive pulmonary disease as above.  4.  Diabetes mellitus. Hold metformin as he is going for CT scan. Cover with sliding scale insulin.  5.  Parkinson's. Continue on Sinemet.    ____________________________ Adin Hector, MD bjk:MT D: 08/15/2014 15:32:41 ET T: 08/15/2014 16:30:03 ET JOB#: [id number]  cc: Adin Hector, MD, <Dictator> Ramonita Lab MD ELECTRONICALLY SIGNED 08/21/2014 13:22

## 2015-01-09 ENCOUNTER — Ambulatory Visit: Payer: Medicare Other | Admitting: Pulmonary Disease

## 2015-01-23 ENCOUNTER — Ambulatory Visit: Admission: RE | Admit: 2015-01-23 | Payer: Medicare Other | Source: Ambulatory Visit

## 2015-01-26 ENCOUNTER — Emergency Department: Payer: Medicare Other

## 2015-01-26 ENCOUNTER — Inpatient Hospital Stay
Admission: EM | Admit: 2015-01-26 | Discharge: 2015-02-01 | DRG: 871 | Disposition: A | Discharge status: Other Acute Inpatient Hospital | Payer: Medicare Other | Attending: Internal Medicine | Admitting: Internal Medicine

## 2015-01-26 ENCOUNTER — Encounter: Payer: Self-pay | Admitting: Emergency Medicine

## 2015-01-26 DIAGNOSIS — Z800 Family history of malignant neoplasm of digestive organs: Secondary | ICD-10-CM | POA: Diagnosis not present

## 2015-01-26 DIAGNOSIS — J189 Pneumonia, unspecified organism: Secondary | ICD-10-CM | POA: Diagnosis not present

## 2015-01-26 DIAGNOSIS — J9621 Acute and chronic respiratory failure with hypoxia: Secondary | ICD-10-CM | POA: Diagnosis present

## 2015-01-26 DIAGNOSIS — Z888 Allergy status to other drugs, medicaments and biological substances status: Secondary | ICD-10-CM | POA: Diagnosis not present

## 2015-01-26 DIAGNOSIS — E890 Postprocedural hypothyroidism: Secondary | ICD-10-CM | POA: Diagnosis present

## 2015-01-26 DIAGNOSIS — A401 Sepsis due to streptococcus, group B: Secondary | ICD-10-CM | POA: Diagnosis not present

## 2015-01-26 DIAGNOSIS — M549 Dorsalgia, unspecified: Secondary | ICD-10-CM

## 2015-01-26 DIAGNOSIS — Z9889 Other specified postprocedural states: Secondary | ICD-10-CM

## 2015-01-26 DIAGNOSIS — E785 Hyperlipidemia, unspecified: Secondary | ICD-10-CM | POA: Diagnosis present

## 2015-01-26 DIAGNOSIS — Z87891 Personal history of nicotine dependence: Secondary | ICD-10-CM | POA: Diagnosis not present

## 2015-01-26 DIAGNOSIS — Z79891 Long term (current) use of opiate analgesic: Secondary | ICD-10-CM | POA: Diagnosis not present

## 2015-01-26 DIAGNOSIS — R918 Other nonspecific abnormal finding of lung field: Secondary | ICD-10-CM | POA: Diagnosis present

## 2015-01-26 DIAGNOSIS — K2270 Barrett's esophagus without dysplasia: Secondary | ICD-10-CM | POA: Diagnosis present

## 2015-01-26 DIAGNOSIS — R7881 Bacteremia: Secondary | ICD-10-CM | POA: Diagnosis present

## 2015-01-26 DIAGNOSIS — Z8701 Personal history of pneumonia (recurrent): Secondary | ICD-10-CM | POA: Diagnosis not present

## 2015-01-26 DIAGNOSIS — B951 Streptococcus, group B, as the cause of diseases classified elsewhere: Secondary | ICD-10-CM | POA: Diagnosis present

## 2015-01-26 DIAGNOSIS — Z79899 Other long term (current) drug therapy: Secondary | ICD-10-CM | POA: Diagnosis not present

## 2015-01-26 DIAGNOSIS — J449 Chronic obstructive pulmonary disease, unspecified: Secondary | ICD-10-CM | POA: Diagnosis present

## 2015-01-26 DIAGNOSIS — E871 Hypo-osmolality and hyponatremia: Secondary | ICD-10-CM | POA: Diagnosis present

## 2015-01-26 DIAGNOSIS — M4646 Discitis, unspecified, lumbar region: Secondary | ICD-10-CM | POA: Diagnosis present

## 2015-01-26 DIAGNOSIS — R7689 Other specified abnormal immunological findings in serum: Secondary | ICD-10-CM | POA: Diagnosis present

## 2015-01-26 DIAGNOSIS — G20 Parkinson's disease: Secondary | ICD-10-CM | POA: Diagnosis present

## 2015-01-26 DIAGNOSIS — M5136 Other intervertebral disc degeneration, lumbar region: Secondary | ICD-10-CM | POA: Diagnosis present

## 2015-01-26 DIAGNOSIS — E119 Type 2 diabetes mellitus without complications: Secondary | ICD-10-CM | POA: Diagnosis present

## 2015-01-26 DIAGNOSIS — Z86718 Personal history of other venous thrombosis and embolism: Secondary | ICD-10-CM

## 2015-01-26 DIAGNOSIS — M1990 Unspecified osteoarthritis, unspecified site: Secondary | ICD-10-CM | POA: Diagnosis present

## 2015-01-26 DIAGNOSIS — Z951 Presence of aortocoronary bypass graft: Secondary | ICD-10-CM

## 2015-01-26 DIAGNOSIS — I2510 Atherosclerotic heart disease of native coronary artery without angina pectoris: Secondary | ICD-10-CM | POA: Diagnosis present

## 2015-01-26 DIAGNOSIS — K5900 Constipation, unspecified: Secondary | ICD-10-CM | POA: Diagnosis present

## 2015-01-26 DIAGNOSIS — M47816 Spondylosis without myelopathy or radiculopathy, lumbar region: Secondary | ICD-10-CM | POA: Diagnosis present

## 2015-01-26 DIAGNOSIS — Z7982 Long term (current) use of aspirin: Secondary | ICD-10-CM

## 2015-01-26 DIAGNOSIS — I10 Essential (primary) hypertension: Secondary | ICD-10-CM | POA: Diagnosis present

## 2015-01-26 DIAGNOSIS — A419 Sepsis, unspecified organism: Secondary | ICD-10-CM | POA: Diagnosis present

## 2015-01-26 DIAGNOSIS — D649 Anemia, unspecified: Secondary | ICD-10-CM | POA: Diagnosis present

## 2015-01-26 DIAGNOSIS — I951 Orthostatic hypotension: Secondary | ICD-10-CM | POA: Diagnosis present

## 2015-01-26 DIAGNOSIS — J690 Pneumonitis due to inhalation of food and vomit: Secondary | ICD-10-CM | POA: Diagnosis present

## 2015-01-26 DIAGNOSIS — Z806 Family history of leukemia: Secondary | ICD-10-CM

## 2015-01-26 DIAGNOSIS — J181 Lobar pneumonia, unspecified organism: Secondary | ICD-10-CM

## 2015-01-26 DIAGNOSIS — I509 Heart failure, unspecified: Secondary | ICD-10-CM | POA: Diagnosis present

## 2015-01-26 DIAGNOSIS — Z794 Long term (current) use of insulin: Secondary | ICD-10-CM | POA: Diagnosis not present

## 2015-01-26 DIAGNOSIS — Z9981 Dependence on supplemental oxygen: Secondary | ICD-10-CM | POA: Diagnosis not present

## 2015-01-26 DIAGNOSIS — Z7901 Long term (current) use of anticoagulants: Secondary | ICD-10-CM

## 2015-01-26 DIAGNOSIS — Z85828 Personal history of other malignant neoplasm of skin: Secondary | ICD-10-CM

## 2015-01-26 DIAGNOSIS — G40909 Epilepsy, unspecified, not intractable, without status epilepticus: Secondary | ICD-10-CM | POA: Diagnosis present

## 2015-01-26 DIAGNOSIS — Z96659 Presence of unspecified artificial knee joint: Secondary | ICD-10-CM | POA: Diagnosis present

## 2015-01-26 DIAGNOSIS — R768 Other specified abnormal immunological findings in serum: Secondary | ICD-10-CM | POA: Diagnosis present

## 2015-01-26 DIAGNOSIS — I252 Old myocardial infarction: Secondary | ICD-10-CM

## 2015-01-26 DIAGNOSIS — J441 Chronic obstructive pulmonary disease with (acute) exacerbation: Secondary | ICD-10-CM | POA: Diagnosis present

## 2015-01-26 DIAGNOSIS — R509 Fever, unspecified: Secondary | ICD-10-CM

## 2015-01-26 LAB — CBC WITH DIFFERENTIAL/PLATELET
Basophils Absolute: 0 10*3/uL (ref 0–0.1)
Basophils Relative: 0 %
Eosinophils Absolute: 0 10*3/uL (ref 0–0.7)
Eosinophils Relative: 0 %
HEMATOCRIT: 34.2 % — AB (ref 40.0–52.0)
Hemoglobin: 10.8 g/dL — ABNORMAL LOW (ref 13.0–18.0)
Lymphocytes Relative: 1 %
Lymphs Abs: 0.2 10*3/uL — ABNORMAL LOW (ref 1.0–3.6)
MCH: 23.6 pg — ABNORMAL LOW (ref 26.0–34.0)
MCHC: 31.6 g/dL — ABNORMAL LOW (ref 32.0–36.0)
MCV: 74.8 fL — AB (ref 80.0–100.0)
MONOS PCT: 2 %
Monocytes Absolute: 0.3 10*3/uL (ref 0.2–1.0)
NEUTROS ABS: 13.3 10*3/uL — AB (ref 1.4–6.5)
Neutrophils Relative %: 97 %
PLATELETS: 182 10*3/uL (ref 150–440)
RBC: 4.57 MIL/uL (ref 4.40–5.90)
RDW: 18.8 % — ABNORMAL HIGH (ref 11.5–14.5)
WBC: 13.9 10*3/uL — AB (ref 3.8–10.6)

## 2015-01-26 LAB — GLUCOSE, CAPILLARY: GLUCOSE-CAPILLARY: 173 mg/dL — AB (ref 65–99)

## 2015-01-26 LAB — COMPREHENSIVE METABOLIC PANEL
ALT: 14 U/L — ABNORMAL LOW (ref 17–63)
AST: 16 U/L (ref 15–41)
Albumin: 3.6 g/dL (ref 3.5–5.0)
Alkaline Phosphatase: 79 U/L (ref 38–126)
Anion gap: 10 (ref 5–15)
BUN: 16 mg/dL (ref 6–20)
CALCIUM: 8.9 mg/dL (ref 8.9–10.3)
CO2: 26 mmol/L (ref 22–32)
Chloride: 97 mmol/L — ABNORMAL LOW (ref 101–111)
Creatinine, Ser: 0.85 mg/dL (ref 0.61–1.24)
GLUCOSE: 184 mg/dL — AB (ref 65–99)
Potassium: 3.8 mmol/L (ref 3.5–5.1)
Sodium: 133 mmol/L — ABNORMAL LOW (ref 135–145)
Total Bilirubin: 0.8 mg/dL (ref 0.3–1.2)
Total Protein: 7.4 g/dL (ref 6.5–8.1)

## 2015-01-26 LAB — MRSA PCR SCREENING: MRSA BY PCR: NEGATIVE

## 2015-01-26 LAB — LACTIC ACID, PLASMA: LACTIC ACID, VENOUS: 0.9 mmol/L (ref 0.5–2.0)

## 2015-01-26 LAB — PROTIME-INR
INR: 1.87
Prothrombin Time: 21.7 seconds — ABNORMAL HIGH (ref 11.4–15.0)

## 2015-01-26 LAB — TROPONIN I: Troponin I: 0.03 ng/mL (ref <0.031)

## 2015-01-26 LAB — BRAIN NATRIURETIC PEPTIDE: B Natriuretic Peptide: 420 pg/mL — ABNORMAL HIGH (ref 0.0–100.0)

## 2015-01-26 MED ORDER — TAMSULOSIN HCL 0.4 MG PO CAPS
0.4000 mg | ORAL_CAPSULE | Freq: Every day | ORAL | Status: DC
  Administered 2015-01-27 – 2015-02-01 (×5): 0.4 mg via ORAL
  Filled 2015-01-26 (×6): qty 1

## 2015-01-26 MED ORDER — SODIUM CHLORIDE 0.9 % IV SOLN
INTRAVENOUS | Status: DC
  Administered 2015-01-26 – 2015-01-28 (×4): via INTRAVENOUS

## 2015-01-26 MED ORDER — DEXTROSE 5 % IV SOLN
INTRAVENOUS | Status: AC
  Filled 2015-01-26 (×2): qty 10

## 2015-01-26 MED ORDER — PIPERACILLIN-TAZOBACTAM 3.375 G IVPB
3.3750 g | INTRAVENOUS | Status: AC
  Administered 2015-01-26: 3.375 g via INTRAVENOUS
  Filled 2015-01-26: qty 50

## 2015-01-26 MED ORDER — ACETAMINOPHEN 325 MG PO TABS
650.0000 mg | ORAL_TABLET | Freq: Four times a day (QID) | ORAL | Status: DC | PRN
  Administered 2015-01-26: 650 mg via ORAL
  Filled 2015-01-26 (×2): qty 2

## 2015-01-26 MED ORDER — DEXTROSE 5 % IV SOLN
500.0000 mg | INTRAVENOUS | Status: DC
  Administered 2015-01-27 – 2015-01-28 (×2): 500 mg via INTRAVENOUS
  Filled 2015-01-26 (×3): qty 500

## 2015-01-26 MED ORDER — VANCOMYCIN HCL 10 G IV SOLR
1250.0000 mg | Freq: Two times a day (BID) | INTRAVENOUS | Status: DC
  Administered 2015-01-27 – 2015-01-28 (×3): 1250 mg via INTRAVENOUS
  Filled 2015-01-26 (×6): qty 1250

## 2015-01-26 MED ORDER — ISOSORBIDE MONONITRATE ER 30 MG PO TB24
30.0000 mg | ORAL_TABLET | Freq: Every day | ORAL | Status: DC
  Administered 2015-01-27 – 2015-01-31 (×5): 30 mg via ORAL
  Filled 2015-01-26 (×7): qty 1

## 2015-01-26 MED ORDER — TIZANIDINE HCL 4 MG PO TABS
4.0000 mg | ORAL_TABLET | Freq: Three times a day (TID) | ORAL | Status: DC | PRN
Start: 2015-01-26 — End: 2015-02-01
  Administered 2015-01-27 – 2015-02-01 (×16): 4 mg via ORAL
  Filled 2015-01-26 (×17): qty 1

## 2015-01-26 MED ORDER — PIPERACILLIN-TAZOBACTAM 3.375 G IVPB
3.3750 g | Freq: Three times a day (TID) | INTRAVENOUS | Status: DC
  Administered 2015-01-27 – 2015-01-30 (×10): 3.375 g via INTRAVENOUS
  Filled 2015-01-26 (×11): qty 50

## 2015-01-26 MED ORDER — HYDROCODONE-ACETAMINOPHEN 5-325 MG PO TABS
1.0000 | ORAL_TABLET | Freq: Three times a day (TID) | ORAL | Status: DC | PRN
  Administered 2015-01-26 – 2015-01-27 (×2): 1 via ORAL
  Filled 2015-01-26 (×3): qty 1

## 2015-01-26 MED ORDER — CEFTRIAXONE SODIUM IN DEXTROSE 40 MG/ML IV SOLN: 2.0000 g | Freq: Once | INTRAVENOUS | Status: DC

## 2015-01-26 MED ORDER — PANTOPRAZOLE SODIUM 40 MG PO TBEC
40.0000 mg | DELAYED_RELEASE_TABLET | Freq: Every day | ORAL | Status: DC
  Administered 2015-01-27 – 2015-02-01 (×5): 40 mg via ORAL
  Filled 2015-01-26 (×5): qty 1

## 2015-01-26 MED ORDER — SODIUM CHLORIDE 0.9 % IV BOLUS (SEPSIS)
20.0000 mL/kg | Freq: Once | INTRAVENOUS | Status: AC
  Administered 2015-01-26: 1000 mL via INTRAVENOUS

## 2015-01-26 MED ORDER — CARBIDOPA-LEVODOPA 25-100 MG PO TABS
2.0000 | ORAL_TABLET | Freq: Three times a day (TID) | ORAL | Status: DC
  Administered 2015-01-26 – 2015-01-28 (×7): 2 via ORAL
  Filled 2015-01-26 (×7): qty 2

## 2015-01-26 MED ORDER — LOSARTAN POTASSIUM 50 MG PO TABS
100.0000 mg | ORAL_TABLET | Freq: Every day | ORAL | Status: DC
  Administered 2015-01-27: 100 mg via ORAL
  Filled 2015-01-26 (×2): qty 2

## 2015-01-26 MED ORDER — DEXTROSE 5 % IV SOLN: 500.0000 mg | Freq: Once | INTRAVENOUS | Status: DC

## 2015-01-26 MED ORDER — VANCOMYCIN HCL IN DEXTROSE 1-5 GM/200ML-% IV SOLN
1000.0000 mg | Freq: Once | INTRAVENOUS | Status: AC
  Administered 2015-01-26: 1000 mg via INTRAVENOUS
  Filled 2015-01-26: qty 200

## 2015-01-26 MED ORDER — ASPIRIN EC 81 MG PO TBEC
81.0000 mg | DELAYED_RELEASE_TABLET | Freq: Every day | ORAL | Status: DC
  Administered 2015-01-27 – 2015-02-01 (×5): 81 mg via ORAL
  Filled 2015-01-26 (×6): qty 1

## 2015-01-26 MED ORDER — LEVOTHYROXINE SODIUM 137 MCG PO TABS
137.0000 ug | ORAL_TABLET | Freq: Every day | ORAL | Status: DC
  Filled 2015-01-26 (×3): qty 1

## 2015-01-26 MED ORDER — PAROXETINE HCL 30 MG PO TABS
60.0000 mg | ORAL_TABLET | Freq: Every day | ORAL | Status: DC
  Administered 2015-01-27 – 2015-02-01 (×6): 60 mg via ORAL
  Filled 2015-01-26: qty 3
  Filled 2015-01-26 (×2): qty 2
  Filled 2015-01-26: qty 6
  Filled 2015-01-26: qty 3
  Filled 2015-01-26: qty 2
  Filled 2015-01-26: qty 6

## 2015-01-26 MED ORDER — LEVOTHYROXINE SODIUM 137 MCG PO TABS: 137.0000 ug | ORAL_TABLET | Freq: Every day | ORAL | Status: DC

## 2015-01-26 MED ORDER — ACETAMINOPHEN 500 MG PO TABS
1000.0000 mg | ORAL_TABLET | ORAL | Status: DC
  Filled 2015-01-26: qty 2

## 2015-01-26 MED ORDER — LAMOTRIGINE 25 MG PO TABS
25.0000 mg | ORAL_TABLET | Freq: Two times a day (BID) | ORAL | Status: DC
  Administered 2015-01-26 – 2015-01-28 (×5): 25 mg via ORAL
  Filled 2015-01-26 (×7): qty 1

## 2015-01-26 MED ORDER — WARFARIN SODIUM 2.5 MG PO TABS
5.0000 mg | ORAL_TABLET | Freq: Every day | ORAL | Status: DC
  Administered 2015-01-26 – 2015-01-27 (×2): 5 mg via ORAL
  Filled 2015-01-26 (×2): qty 2

## 2015-01-26 MED ORDER — ATORVASTATIN CALCIUM 20 MG PO TABS
40.0000 mg | ORAL_TABLET | Freq: Every day | ORAL | Status: DC
  Administered 2015-01-26 – 2015-01-31 (×6): 40 mg via ORAL
  Filled 2015-01-26 (×6): qty 2

## 2015-01-26 MED ORDER — CLONAZEPAM 0.5 MG PO TABS
0.5000 mg | ORAL_TABLET | Freq: Three times a day (TID) | ORAL | Status: DC | PRN
  Administered 2015-01-27 – 2015-02-01 (×10): 0.5 mg via ORAL
  Filled 2015-01-26 (×10): qty 1

## 2015-01-26 MED ORDER — BISOPROLOL FUMARATE 5 MG PO TABS
10.0000 mg | ORAL_TABLET | Freq: Every day | ORAL | Status: DC
  Administered 2015-01-27 – 2015-01-31 (×5): 10 mg via ORAL
  Filled 2015-01-26 (×2): qty 1
  Filled 2015-01-26 (×2): qty 2
  Filled 2015-01-26: qty 1
  Filled 2015-01-26 (×2): qty 2

## 2015-01-26 MED ORDER — LEVALBUTEROL HCL 1.25 MG/0.5ML IN NEBU
1.2500 mg | INHALATION_SOLUTION | Freq: Four times a day (QID) | RESPIRATORY_TRACT | Status: DC
  Administered 2015-01-27 – 2015-01-28 (×7): 1.25 mg via RESPIRATORY_TRACT
  Filled 2015-01-26 (×8): qty 0.5

## 2015-01-26 MED ORDER — AMLODIPINE BESYLATE 5 MG PO TABS
5.0000 mg | ORAL_TABLET | Freq: Every day | ORAL | Status: DC
  Administered 2015-01-27: 5 mg via ORAL
  Filled 2015-01-26 (×2): qty 1

## 2015-01-26 MED ORDER — HEPARIN SODIUM (PORCINE) 5000 UNIT/ML IJ SOLN
5000.0000 [IU] | Freq: Three times a day (TID) | INTRAMUSCULAR | Status: DC
  Administered 2015-01-26 – 2015-01-27 (×2): 5000 [IU] via SUBCUTANEOUS
  Filled 2015-01-26 (×2): qty 1

## 2015-01-26 NOTE — Progress Notes (Signed)
ANTIBIOTIC CONSULT NOTE - INITIAL  Pharmacy Consult for Vancomycin/Zosyn/Azithromycin Indication: pneumonia  Allergies  Allergen Reactions  . Other Other (See Comments)    Pt states that inhaled medications make him depressed and have negative thoughts.      Patient Measurements: Height: 5' 8.5" (174 cm) Weight: 194 lb 4 oz (88.111 kg) IBW/kg (Calculated) : 69.55 Adjusted Body Weight: 76.3 kg  Vital Signs: Temp: 103.1 F (39.5 C) (07/22 1941) Temp Source: Oral (07/22 1941) BP: 110/66 mmHg (07/22 2115) Pulse Rate: 73 (07/22 2115) Intake/Output from previous day:   Intake/Output from this shift:    Labs:  Recent Labs  01/26/15 1946  WBC 13.9*  HGB 10.8*  PLT 182  CREATININE 0.85   Estimated Creatinine Clearance: 89.3 mL/min (by C-G formula based on Cr of 0.85). No results for input(s): VANCOTROUGH, VANCOPEAK, VANCORANDOM, GENTTROUGH, GENTPEAK, GENTRANDOM, TOBRATROUGH, TOBRAPEAK, TOBRARND, AMIKACINPEAK, AMIKACINTROU, AMIKACIN in the last 72 hours.   Microbiology: No results found for this or any previous visit (from the past 720 hour(s)).  Medical History: Past Medical History  Diagnosis Date  . Barrett esophagus   . Hypertension   . Hyperlipidemia   . Depression   . Multiple pulmonary nodules   . CHF (congestive heart failure)   . Lumbar spinal stenosis   . S/P CABG (coronary artery bypass graft)   . COPD (chronic obstructive pulmonary disease)   . CAD (coronary artery disease)   . Basal cell carcinoma   . Diabetes mellitus   . Cancer of neck   . Heart attack     Medications:  Scheduled:  . [START ON 01/27/2015] amLODipine  5 mg Oral Daily  . [START ON 01/27/2015] aspirin EC  81 mg Oral Daily  . atorvastatin  40 mg Oral QHS  . [START ON 01/27/2015] azithromycin  500 mg Intravenous Q24H  . [START ON 01/27/2015] bisoprolol  10 mg Oral Daily  . carbidopa-levodopa  2 tablet Oral TID  . cefTRIAXone (ROCEPHIN) IVPB 1 gram/50 mL D5W      . heparin  5,000  Units Subcutaneous 3 times per day  . [START ON 01/27/2015] isosorbide mononitrate  30 mg Oral Daily  . lamoTRIgine  25 mg Oral BID  . [START ON 01/27/2015] levalbuterol  1.25 mg Nebulization 4 times per day  . [START ON 01/27/2015] levothyroxine  137 mcg Oral Daily  . [START ON 01/27/2015] losartan  100 mg Oral Daily  . [START ON 01/27/2015] pantoprazole  40 mg Oral Daily  . [START ON 01/27/2015] PARoxetine  60 mg Oral Daily  . piperacillin-tazobactam (ZOSYN)  IV  3.375 g Intravenous STAT  . [START ON 01/27/2015] piperacillin-tazobactam (ZOSYN)  IV  3.375 g Intravenous 3 times per day  . [START ON 01/27/2015] tamsulosin  0.4 mg Oral Daily  . [START ON 01/27/2015] vancomycin  1,250 mg Intravenous Q12H  . warfarin  5 mg Oral q1800   Infusions:  . sodium chloride     PRN: clonazePAM, HYDROcodone-acetaminophen, tiZANidine  Assessment: 69 y/o M ordered empiric abx for PNA.   Goal of Therapy:  Vancomycin trough level 15-20 mcg/ml  Plan:  1. Vancomycin 1000 mg iv once given in ED. Will order vancomycin 1250 mg iv q 12 h with stacked dosing and a trough with the 5th dose.   2. Zosyn 3.375 g EI q 8 h.   3. Azithromycin 500 mg iv q 24 h.   Measure antibiotic drug levels at steady state Follow up culture results  Ulice Dash D 01/26/2015,10:15  PM

## 2015-01-26 NOTE — ED Provider Notes (Signed)
[facility] Emergency Department Provider Note ----------------------------------------- 7:53 PM on 01/26/2015 -----------------------------------------  Please note that upon evaluation of the patient and in recognition of his vital signs and chief complaint we have initiated ED sepsis protocol for suspected community-acquired pneumonia. ____________________________________________  Time seen: Approximately 7:53 PM  I have reviewed the triage vital signs and the nursing notes.  EM caveat: The patient in respiratory extremities unable to provide full review of systems history or detailed exam due to acuity and respiratory distress. On BiPAP.  HISTORY  Chief Complaint Shortness of Breath    HPI John Mendoza is a 69 y.o. male who presents with a cough beginning last night. This evening he developed severe increase in his shortness of breath. He called EMS and they noticed his oxygen level to be less than 70% on room air. He had elevated blood pressure with EMS and crackles in both lung fields and was initiated on CPAP, had nitroglycerin paste placed for possible CHF. Please note I have removed nitroglycerin paste given the patient has a fever to 103 now.  The patient denies being in pain except for chronic lower back pain which is unchanged. He states that he feels less short of breath now, but he has been coughing and feeling severe chills for the last day.   Past Medical History  Diagnosis Date  . Barrett esophagus   . Hypertension   . Hyperlipidemia   . Depression   . Multiple pulmonary nodules   . CHF (congestive heart failure)   . Lumbar spinal stenosis   . S/P CABG (coronary artery bypass graft)   . COPD (chronic obstructive pulmonary disease)   . CAD (coronary artery disease)   . Basal cell carcinoma   . Diabetes mellitus   . Cancer of neck   . Heart attack     Patient Active Problem List   Diagnosis Date Noted  . Pneumonia 01/26/2015  . DVT  (deep venous thrombosis) 02/20/2014  . Dyspnea 09/02/2013  . Elevated antinuclear antibody (ANA) level 02/08/2013  . 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 Multiple pulmonary nodules 01/15/2012  . Aspiration into respiratory tract 01/15/2012  . Hypertension 01/15/2012  . Barrett's esophagus 01/15/2012  . CAD (coronary artery disease) 01/15/2012  . COPD (chronic obstructive pulmonary disease) 01/15/2012    Past Surgical History  Procedure Laterality Date  . Bypass graft    . Cervical fusion    . Radial neck dissection    . Multiple stents    . Total knee arthroplasty  2011  . Thyroidectomy  1996    Current Outpatient Rx  Name  Route  Sig  Dispense  Refill  . amLODipine (NORVASC) 5 MG tablet   Oral   Take 5 mg by mouth daily.         Marland Kitchen aspirin EC 81 MG tablet   Oral   Take 81 mg by mouth daily.         Marland Kitchen atorvastatin (LIPITOR) 40 MG tablet   Oral   Take 40 mg by mouth at bedtime.         . bisoprolol (ZEBETA) 10 MG tablet   Oral   Take 10 mg by mouth daily.         . carbidopa-levodopa (SINEMET IR) 25-100 MG per tablet   Oral   Take 2 tablets by mouth 3 (three) times daily.          . clonazePAM (KLONOPIN) 1 MG tablet   Oral  Take 0.5 mg by mouth 3 (three) times daily as needed for anxiety.         Marland Kitchen HYDROcodone-acetaminophen (NORCO/VICODIN) 5-325 MG per tablet   Oral   Take 1 tablet by mouth every 8 (eight) hours as needed for moderate pain.         Marland Kitchen ipratropium-albuterol (DUONEB) 0.5-2.5 (3) MG/3ML SOLN   Nebulization   Take 3 mLs by nebulization every 6 (six) hours as needed (for shortness of breath).          . isosorbide mononitrate (IMDUR) 30 MG 24 hr tablet   Oral   Take 30 mg by mouth daily.         Marland Kitchen lamoTRIgine (LAMICTAL) 25 MG tablet   Oral   Take 25 mg by mouth 2 (two) times daily.         Marland Kitchen levothyroxine (SYNTHROID, LEVOTHROID) 137 MCG tablet   Oral   Take 137 mcg by mouth daily.         Marland Kitchen losartan  (COZAAR) 100 MG tablet   Oral   Take 100 mg by mouth daily.         . metFORMIN (GLUCOPHAGE) 500 MG tablet   Oral   Take 500 mg by mouth 2 (two) times daily.          Marland Kitchen omeprazole (PRILOSEC) 20 MG capsule   Oral   Take 40 mg by mouth 2 (two) times daily.         Marland Kitchen PARoxetine (PAXIL) 20 MG tablet   Oral   Take 60 mg by mouth daily.          . Sennosides 25 MG TABS   Oral   Take 1 tablet by mouth as needed (for constipation).         . tamsulosin (FLOMAX) 0.4 MG CAPS   Oral   Take 0.4 mg by mouth daily.          Marland Kitchen tiZANidine (ZANAFLEX) 4 MG tablet   Oral   Take 4 mg by mouth every 8 (eight) hours as needed for muscle spasms.         Marland Kitchen warfarin (COUMADIN) 4 MG tablet   Oral   Take 4 mg by mouth every evening.          . budesonide (PULMICORT) 0.25 MG/2ML nebulizer solution   Nebulization   Take 2 mLs (0.25 mg total) by nebulization as needed. Patient not taking: Reported on 07/12/2014   60 mL   5     Allergies Other  Family History  Problem Relation Age of Onset  . Leukemia Paternal Grandmother   . Cancer Maternal Grandmother     stomach  . Cancer Paternal Aunt   . Other Father     bacterial endocarditis  . Rheum arthritis Father     Social History History  Substance Use Topics  . Smoking status: Former Smoker -- 2.00 packs/day for 35 years    Types: Cigarettes    Quit date: 07/07/1994  . Smokeless tobacco: Never Used  . Alcohol Use: No    Review of Systems Denies pain, except for chronic low back pain Reports fevers and chills Reports shortness of breath and cough  Denies allergies to any medications. Chart states he may have allergies to inhaled medications. ____________________________________________   PHYSICAL EXAM:  VITAL SIGNS: ED Triage Vitals  Enc Vitals Group     BP 01/26/15 1941 134/73 mmHg     Pulse Rate 01/26/15 1941 103  Resp 01/26/15 1941 37     Temp 01/26/15 1941 103.1 F (39.5 C)     Temp Source  01/26/15 1941 Oral     SpO2 01/26/15 1941 94 %     Weight 01/26/15 1943 194 lb 4 oz (88.111 kg)     Height 01/26/15 1952 5' 8.5" (1.74 m)     Head Cir --      Peak Flow --      Pain Score --      Pain Loc --      Pain Edu? --      Excl. in [place]? --     Constitutional: Alert in moderate distress with increased work of breathing.  Eyes: Conjunctivae are normal. PERRL. EOMI. Head: Atraumatic. Nose: No congestion/rhinnorhea. Mouth/Throat: Mucous membranes are moist.  Oropharynx non-erythematous. Neck: No stridor.   Cardiovascular: Tachycardia. BiPAP makes it somewhat difficult to auscultate but Grossly normal heart sounds.  Good peripheral circulation. Respiratory: Moderate increased work of breathing with accessory muscle use. Patient has not and in extremis, and is tolerating BiPAP well. He is able to speak one to 2 words over the BiPAP mask. He does have Rales in both lungs bilaterally. No wheezing appreciated. Gastrointestinal: Soft and nontender. No distention. No abdominal bruits. No CVA tenderness. Musculoskeletal: Very slight edema noted in lower extremities bilaterally.  No joint effusions. Neurologic:  Normal speech and language. No gross focal neurologic deficits are appreciated. No gait instability. Skin:  Skin is warm to touch and slightly diaphoretic. Psychiatric: Patient is slightly anxious and fully alert.  ____________________________________________   LABS (all labs ordered are listed, but only abnormal results are displayed)  Labs Reviewed  COMPREHENSIVE METABOLIC PANEL - Abnormal; Notable for the following:    Sodium 133 (*)    Chloride 97 (*)    Glucose, Bld 184 (*)    ALT 14 (*)    All other components within normal limits  CBC WITH DIFFERENTIAL/PLATELET - Abnormal; Notable for the following:    WBC 13.9 (*)    Hemoglobin 10.8 (*)    HCT 34.2 (*)    MCV 74.8 (*)    MCH 23.6 (*)    MCHC 31.6 (*)    RDW 18.8 (*)    Neutro Abs 13.3 (*)    Lymphs Abs 0.2 (*)     All other components within normal limits  BRAIN NATRIURETIC PEPTIDE - Abnormal; Notable for the following:    B Natriuretic Peptide 420.0 (*)    All other components within normal limits  BLOOD GAS, VENOUS - Abnormal; Notable for the following:    pH, Ven 7.44 (*)    pCO2, Ven 41 (*)    Acid-Base Excess 3.3 (*)    All other components within normal limits  PROTIME-INR - Abnormal; Notable for the following:    Prothrombin Time 21.7 (*)    All other components within normal limits  CULTURE, BLOOD (ROUTINE X 2)  CULTURE, BLOOD (ROUTINE X 2)  URINE CULTURE  LACTIC ACID, PLASMA  TROPONIN I  LACTIC ACID, PLASMA  URINALYSIS COMPLETEWITH MICROSCOPIC ([facility] ONLY)   ____________________________________________  EKG  Reviewed and interpreted by me Sinus tachycardia with ventricular rate of 103 QRS appears normal at 110 QTc 420 There is significant respiratory artifact present, I do not see any obvious acute ischemic change to her may be some T-wave inversions in 3 V6 and V5 along with possible minimal voltage criteria for LVH. There is no evidence of obvious acute ischemic change on this  12-lead. ____________________________________________  RADIOLOGY  CLINICAL DATA: Shortness of Breath  EXAM: PORTABLE CHEST - 1 VIEW  COMPARISON: Chest CT August 15, 2014  FINDINGS: There is airspace consolidation in portions of the right middle and lower lobes. There is mild atelectasis in left base. Heart is enlarged with pulmonary vascularity within normal limits. No adenopathy. Patient is status post coronary artery bypass grafting.  IMPRESSION: Areas of patchy infiltrate in the right middle and lower lobes. Left base atelectasis.  Followup PA and lateral chest X radiographs recommended in 3-4 weeks following trial of antibiotic therapy to ensure resolution and exclude underlying malignancy. ____________________________________________   PROCEDURES  Procedure(s) performed:  None  Critical Care performed: Yes, see critical care note(s)  CRITICAL CARE Performed by: Delman Kitten   Total critical care time: 55  Critical care time was exclusive of separately billable procedures and treating other patients.  Critical care was necessary to treat or prevent imminent or life-threatening deterioration.  Critical care was time spent personally by me on the following activities: development of treatment plan with patient and/or surrogate as well as nursing, discussions with consultants, evaluation of patient's response to treatment, examination of patient, obtaining history from patient or surrogate, ordering and performing treatments and interventions, ordering and review of laboratory studies, ordering and review of radiographic studies, pulse oximetry and re-evaluation of patient's condition.  Patient presents with severe hypoxia with associated fever and increased work of breathing. Differential diagnosis is broad, however appears to be an acute pulmonary process possibly pneumonia with fever, strong consideration for sepsis, also strong potential for congestive heart failure however in the setting of possible sepsis I will withhold diuresis until you're able to gain additional information. He is presently tolerating BiPAP well and is improved oxygenation well. Await labs and chest x-ray at this time. Clearly anticipated admission to intensive care unit. Have changed antibiotic regimen to vancomycin and Zosyn based on a history of MAI recently documented in his chart suggestive of possibility of immunocompromise state. ____________________________________________   INITIAL IMPRESSION / ASSESSMENT AND PLAN / ED COURSE  Pertinent labs & imaging results that were available during my care of the patient were reviewed by me and considered in my medical decision making (see chart for details).  ----------------------------------------- 8:33 PM on  01/26/2015 -----------------------------------------  Patient and wife updated. He appears improved and is resting with improved work of breathing currently tolerating BiPAP well. Overall status appears improved. Chest x-ray and history appear consistent with acute multilobar pneumonia. We will choose broad-spectrum antibiotic's given a history of MAI. I have paged hospitalist for admission. Await lactic acid and chemistry. ____________________________________________  Chemistry resulted. Discussed with hospitalist service will admit the patient for severe pneumonia. His present history does not suggest acute congestive heart failure and we will initiate fluid resuscitation for sepsis now at 20 mL/kg rate I previously withheld this is a is concerned he may been presenting with CHF as well.  BNP is pending. Wife states he also has a history of vasculitis of the lungs, but he has not been coughing up any blood and has recently been on chemotherapy through [facility] for this. At this point I strongly favor acute infectious etiology. Discussed with Dr. Bridgett Larsson who will admit to the hospital.  FINAL CLINICAL IMPRESSION(S) / ED DIAGNOSES  Final diagnoses:  Pneumonia, organism unspecified  Lobar pneumonia  Sepsis, due to unspecified organism  Fever, unspecified fever cause      Delman Kitten, MD 01/26/15 2057

## 2015-01-26 NOTE — ED Notes (Signed)
Ntg paste remvoed on patient arrival

## 2015-01-26 NOTE — H&P (Addendum)
[facility] NAME: John Mendoza    MR#:  [id number]  DATE OF BIRTH:  Nov 25, 1945  DATE OF ADMISSION:  01/26/2015  PRIMARY CARE PHYSICIAN: Adin Hector, MD   REQUESTING/REFERRING PHYSICIAN: Delman Kitten, MD  CHIEF COMPLAINT:   Chief Complaint  Patient presents with  . Shortness of Breath   cough and shortness of breath since last night.  HISTORY OF PRESENT ILLNESS:  John Mendoza  is a 69 y.o. male with a known history of hypertension, diabetes, COPD, CHF and CAD. The patient is started to have a cough and shortness breath since last night which has been worsening. He also complains of fever and chills and generalized weakness. He denies any chest pain, orthopnea or nocturnal dyspnea or leg edema. His O2 sats was low at 70%, noticed by EMS. He also have a fever of 103 the ED. Chest x-ray show bilateral multilobar pneumonia. He was put on BiPAP in the ED.  PAST MEDICAL HISTORY:   Past Medical History  Diagnosis Date  . Barrett esophagus   . Hypertension   . Hyperlipidemia   . Depression   . Multiple pulmonary nodules   . CHF (congestive heart failure)   . Lumbar spinal stenosis   . S/P CABG (coronary artery bypass graft)   . COPD (chronic obstructive pulmonary disease)   . CAD (coronary artery disease)   . Basal cell carcinoma   . Diabetes mellitus   . Cancer of neck   . Heart attack     PAST SURGICAL HISTORY:   Past Surgical History  Procedure Laterality Date  . Bypass graft    . Cervical fusion    . Radial neck dissection    . Multiple stents    . Total knee arthroplasty  2011  . Thyroidectomy  1996    SOCIAL HISTORY:   History  Substance Use Topics  . Smoking status: Former Smoker -- 2.00 packs/day for 35 years    Types: Cigarettes    Quit date: 07/07/1994  . Smokeless tobacco: Never Used  . Alcohol Use: No    FAMILY HISTORY:   Family History  Problem Relation Age of Onset  . Leukemia  Paternal Grandmother   . Cancer Maternal Grandmother     stomach  . Cancer Paternal Aunt   . Other Father     bacterial endocarditis  . Rheum arthritis Father     DRUG ALLERGIES:   Allergies  Allergen Reactions  . Other Other (See Comments)    Pt states that inhaled medications make him depressed and have negative thoughts.      REVIEW OF SYSTEMS:  CONSTITUTIONAL: No fever, generalized weakness.  EYES: No blurred or double vision.  EARS, NOSE, AND THROAT: No tinnitus or ear pain.  RESPIRATORY: Has cough, shortness of breath, wheezing but no hemoptysis.  CARDIOVASCULAR: No chest pain, orthopnea, edema.  GASTROINTESTINAL: No nausea, vomiting, diarrhea or abdominal pain.  GENITOURINARY: No dysuria, hematuria.  ENDOCRINE: No polyuria, nocturia,  HEMATOLOGY: No anemia, easy bruising or bleeding SKIN: No rash or lesion. MUSCULOSKELETAL: No joint pain or arthritis.   NEUROLOGIC: No tingling, numbness, weakness.  PSYCHIATRY: No anxiety or depression.   MEDICATIONS AT HOME:   Prior to Admission medications   Medication Sig Start Date End Date Taking? Authorizing Provider  amLODipine (NORVASC) 5 MG tablet Take 5 mg by mouth daily.   Yes Historical Provider, MD  aspirin EC 81 MG tablet Take 81 mg  by mouth daily.   Yes Historical Provider, MD  atorvastatin (LIPITOR) 40 MG tablet Take 40 mg by mouth at bedtime.   Yes Historical Provider, MD  bisoprolol (ZEBETA) 10 MG tablet Take 10 mg by mouth daily.   Yes Historical Provider, MD  carbidopa-levodopa (SINEMET IR) 25-100 MG per tablet Take 2 tablets by mouth 3 (three) times daily.    Yes Historical Provider, MD  clonazePAM (KLONOPIN) 1 MG tablet Take 0.5 mg by mouth 3 (three) times daily as needed for anxiety.   Yes Historical Provider, MD  HYDROcodone-acetaminophen (NORCO/VICODIN) 5-325 MG per tablet Take 1 tablet by mouth every 8 (eight) hours as needed for moderate pain.   Yes Historical Provider, MD  ipratropium-albuterol (DUONEB)  0.5-2.5 (3) MG/3ML SOLN Take 3 mLs by nebulization every 6 (six) hours as needed (for shortness of breath).    Yes Juanito Doom, MD  isosorbide mononitrate (IMDUR) 30 MG 24 hr tablet Take 30 mg by mouth daily.   Yes Historical Provider, MD  lamoTRIgine (LAMICTAL) 25 MG tablet Take 25 mg by mouth 2 (two) times daily.   Yes Historical Provider, MD  levothyroxine (SYNTHROID, LEVOTHROID) 137 MCG tablet Take 137 mcg by mouth daily.   Yes Historical Provider, MD  losartan (COZAAR) 100 MG tablet Take 100 mg by mouth daily.   Yes Historical Provider, MD  metFORMIN (GLUCOPHAGE) 500 MG tablet Take 500 mg by mouth 2 (two) times daily.    Yes Historical Provider, MD  omeprazole (PRILOSEC) 20 MG capsule Take 40 mg by mouth 2 (two) times daily.   Yes Historical Provider, MD  PARoxetine (PAXIL) 20 MG tablet Take 60 mg by mouth daily.    Yes Historical Provider, MD  Sennosides 25 MG TABS Take 1 tablet by mouth as needed (for constipation).   Yes Historical Provider, MD  tamsulosin (FLOMAX) 0.4 MG CAPS Take 0.4 mg by mouth daily.    Yes Historical Provider, MD  tiZANidine (ZANAFLEX) 4 MG tablet Take 4 mg by mouth every 8 (eight) hours as needed for muscle spasms.   Yes Historical Provider, MD  warfarin (COUMADIN) 4 MG tablet Take 4 mg by mouth every evening.    Yes Historical Provider, MD  budesonide (PULMICORT) 0.25 MG/2ML nebulizer solution Take 2 mLs (0.25 mg total) by nebulization as needed. Patient not taking: Reported on 07/12/2014 09/02/13   Juanito Doom, MD      VITAL SIGNS:  Blood pressure 110/66, pulse 73, temperature 103.1 F (39.5 C), temperature source Oral, resp. rate 30, height 5' 8.5" (1.74 m), weight 88.111 kg (194 lb 4 oz), SpO2 99 %.  PHYSICAL EXAMINATION:  GENERAL:  69 y.o.-year-old patient lying in the bed with respiratory distress. On BiPAP. EYES: Pupils equal, round, reactive to light and accommodation. No scleral icterus. Extraocular muscles intact.  HEENT: Head atraumatic,  normocephalic. Oropharynx and nasopharynx clear.  NECK:  Supple, no jugular venous distention. No thyroid enlargement, no tenderness.  LUNGS: Diminished breath sounds bilaterally, bilateral crackles. With use of accessory muscles of respiration.  CARDIOVASCULAR: S1, S2 normal. No murmurs, rubs, or gallops.  ABDOMEN: Soft, nontender, nondistended. Bowel sounds present. No organomegaly or mass.  EXTREMITIES: No pedal edema, cyanosis, or clubbing. No calf tenderness. NEUROLOGIC: Cranial nerves II through XII are intact. Muscle strength 4/5 in all extremities. Sensation intact. Gait not checked.  PSYCHIATRIC: The patient is alert and oriented x 3.  SKIN: No obvious rash, lesion, or ulcer.   LABORATORY PANEL:   CBC  Recent Labs Lab  01/26/15 1946  WBC 13.9*  HGB 10.8*  HCT 34.2*  PLT 182   ------------------------------------------------------------------------------------------------------------------  Chemistries   Recent Labs Lab 01/26/15 1946  NA 133*  K 3.8  CL 97*  CO2 26  GLUCOSE 184*  BUN 16  CREATININE 0.85  CALCIUM 8.9  AST 16  ALT 14*  ALKPHOS 79  BILITOT 0.8   ------------------------------------------------------------------------------------------------------------------  Cardiac Enzymes  Recent Labs Lab 01/26/15 1946  TROPONINI 0.03   ------------------------------------------------------------------------------------------------------------------  RADIOLOGY:  Dg Chest Port 1 View  01/26/2015   CLINICAL DATA:  Shortness of Breath  EXAM: PORTABLE CHEST - 1 VIEW  COMPARISON:  Chest CT August 15, 2014  FINDINGS: There is airspace consolidation in portions of the right middle and lower lobes. There is mild atelectasis in left base. Heart is enlarged with pulmonary vascularity within normal limits. No adenopathy. Patient is status post coronary artery bypass grafting.  IMPRESSION: Areas of patchy infiltrate in the right middle and lower lobes. Left base  atelectasis.  Followup PA and lateral chest X radiographs recommended in 3-4 weeks following trial of antibiotic therapy to ensure resolution and exclude underlying malignancy.   Electronically Signed   By: Lowella Grip III M.D.   On: 01/26/2015 20:22    EKG:   Orders placed or performed in visit on 08/15/14  . EKG 12-Lead    IMPRESSION AND PLAN:   Acute respiratory failure with hypoxia Bilateral pneumonia with sepsis COPD Hyponatremia Anemia History of DVT  The patient will be admitted to stepdown unit. I will continue BiPAP and antibiotics including Zithromax, Zosyn and vancomycin. Follow-up CBC, sputum culture and blood culture. Give nebulizer treatment. Continue Coumadin dose per pharmacy. Continue aspirin, Lipitor and hypertension medication.   All the records are reviewed and case discussed with ED provider. Management plans discussed with the patient, his wife and they are in agreement.  CODE STATUS: Full code  TOTAL CRITICAL TIME TAKING CARE OF THIS PATIENT: 62 minutes.    Demetrios Loll M.D on 01/26/2015 at 9:38 PM  Between 7am to 6pm - Pager - [phone]  After 6pm go to [email or web address] - password [email or web address] [facility]  [facility]  Office  [phone]  CC: Primary care physician; Adin Hector, MD

## 2015-01-26 NOTE — ED Notes (Signed)
Report called to CCU, given to [facility], [place].

## 2015-01-26 NOTE — Progress Notes (Signed)
ANTICOAGULATION CONSULT NOTE - Initial Consult  Pharmacy Consult for Coumadin Indication: h/o DVT?  Allergies  Allergen Reactions  . Other Other (See Comments)    Pt states that inhaled medications make him depressed and have negative thoughts.      Patient Measurements: Height: 5' 8.5" (174 cm) Weight: 194 lb 4 oz (88.111 kg) IBW/kg (Calculated) : 69.55   Vital Signs: Temp: 103.1 F (39.5 C) (07/22 1941) Temp Source: Oral (07/22 1941) BP: 110/66 mmHg (07/22 2115) Pulse Rate: 73 (07/22 2115)  Labs:  Recent Labs  01/26/15 1946  HGB 10.8*  HCT 34.2*  PLT 182  LABPROT 21.7*  INR 1.87  CREATININE 0.85  TROPONINI 0.03    Estimated Creatinine Clearance: 89.3 mL/min (by C-G formula based on Cr of 0.85).   Medical History: Past Medical History  Diagnosis Date  . Barrett esophagus   . Hypertension   . Hyperlipidemia   . Depression   . Multiple pulmonary nodules   . CHF (congestive heart failure)   . Lumbar spinal stenosis   . S/P CABG (coronary artery bypass graft)   . COPD (chronic obstructive pulmonary disease)   . CAD (coronary artery disease)   . Basal cell carcinoma   . Diabetes mellitus   . Cancer of neck   . Heart attack     Medications:  Prescriptions prior to admission  Medication Sig Dispense Refill Last Dose  . amLODipine (NORVASC) 5 MG tablet Take 5 mg by mouth daily.   unknown at unknown  . aspirin EC 81 MG tablet Take 81 mg by mouth daily.   unknown at unknown  . atorvastatin (LIPITOR) 40 MG tablet Take 40 mg by mouth at bedtime.   unknown at unknown  . bisoprolol (ZEBETA) 10 MG tablet Take 10 mg by mouth daily.   unknown at unknown  . carbidopa-levodopa (SINEMET IR) 25-100 MG per tablet Take 2 tablets by mouth 3 (three) times daily.    unknown at unknown  . clonazePAM (KLONOPIN) 1 MG tablet Take 0.5 mg by mouth 3 (three) times daily as needed for anxiety.   PRN at PRN  . HYDROcodone-acetaminophen (NORCO/VICODIN) 5-325 MG per tablet Take 1  tablet by mouth every 8 (eight) hours as needed for moderate pain.   PRN at PRN  . ipratropium-albuterol (DUONEB) 0.5-2.5 (3) MG/3ML SOLN Take 3 mLs by nebulization every 6 (six) hours as needed (for shortness of breath).    PRN at PRN  . isosorbide mononitrate (IMDUR) 30 MG 24 hr tablet Take 30 mg by mouth daily.   unknown at unknown  . lamoTRIgine (LAMICTAL) 25 MG tablet Take 25 mg by mouth 2 (two) times daily.   unknown at unknown  . levothyroxine (SYNTHROID, LEVOTHROID) 137 MCG tablet Take 137 mcg by mouth daily.   unknown at unknown  . losartan (COZAAR) 100 MG tablet Take 100 mg by mouth daily.   unknown at unknown  . metFORMIN (GLUCOPHAGE) 500 MG tablet Take 500 mg by mouth 2 (two) times daily.    unknown at unknown  . omeprazole (PRILOSEC) 20 MG capsule Take 40 mg by mouth 2 (two) times daily.   unknown at unknown  . PARoxetine (PAXIL) 20 MG tablet Take 60 mg by mouth daily.    unknown at unknown  . Sennosides 25 MG TABS Take 1 tablet by mouth as needed (for constipation).   PRN at PRN  . tamsulosin (FLOMAX) 0.4 MG CAPS Take 0.4 mg by mouth daily.    unknown at  unknown  . tiZANidine (ZANAFLEX) 4 MG tablet Take 4 mg by mouth every 8 (eight) hours as needed for muscle spasms.   PRN at PRN  . warfarin (COUMADIN) 4 MG tablet Take 4 mg by mouth every evening.    unknown at unknown  . budesonide (PULMICORT) 0.25 MG/2ML nebulizer solution Take 2 mLs (0.25 mg total) by nebulization as needed. (Patient not taking: Reported on 07/12/2014) 60 mL 5 Not Taking   Scheduled:  . [START ON 01/27/2015] amLODipine  5 mg Oral Daily  . [START ON 01/27/2015] aspirin EC  81 mg Oral Daily  . atorvastatin  40 mg Oral QHS  . [START ON 01/27/2015] azithromycin  500 mg Intravenous Q24H  . [START ON 01/27/2015] bisoprolol  10 mg Oral Daily  . carbidopa-levodopa  2 tablet Oral TID  . cefTRIAXone (ROCEPHIN) IVPB 1 gram/50 mL D5W      . heparin  5,000 Units Subcutaneous 3 times per day  . [START ON 01/27/2015] isosorbide  mononitrate  30 mg Oral Daily  . lamoTRIgine  25 mg Oral BID  . [START ON 01/27/2015] levalbuterol  1.25 mg Nebulization 4 times per day  . [START ON 01/27/2015] levothyroxine  137 mcg Oral Daily  . [START ON 01/27/2015] losartan  100 mg Oral Daily  . [START ON 01/27/2015] pantoprazole  40 mg Oral Daily  . [START ON 01/27/2015] PARoxetine  60 mg Oral Daily  . piperacillin-tazobactam (ZOSYN)  IV  3.375 g Intravenous STAT  . [START ON 01/27/2015] piperacillin-tazobactam (ZOSYN)  IV  3.375 g Intravenous 3 times per day  . [START ON 01/27/2015] tamsulosin  0.4 mg Oral Daily  . [START ON 01/27/2015] vancomycin  1,250 mg Intravenous Q12H  . warfarin  5 mg Oral q1800   Infusions:  . sodium chloride     PRN: clonazePAM, HYDROcodone-acetaminophen, tiZANidine  Assessment: Patient on Coumadin 4 mg daily PTA presumably for h/o DVT with almost therapeutic INR.   Goal of Therapy:  INR 2-3    Plan:  Will order Coumadin 5 mg daily and f/u AM INR.   Ulice Dash D 01/26/2015,10:19 PM

## 2015-01-26 NOTE — ED Notes (Signed)
Patient appears more comfortable at this time.

## 2015-01-26 NOTE — ED Notes (Signed)
Patient to ED from home via EMS for shortness of breath.  On EMS arrival pt pulse oxi in 60's with c-pap up to 87%.

## 2015-01-27 ENCOUNTER — Encounter: Payer: Self-pay | Admitting: *Deleted

## 2015-01-27 DIAGNOSIS — J690 Pneumonitis due to inhalation of food and vomit: Secondary | ICD-10-CM | POA: Diagnosis present

## 2015-01-27 LAB — URINALYSIS COMPLETE WITH MICROSCOPIC (ARMC ONLY)
Bacteria, UA: NONE SEEN
Bilirubin Urine: NEGATIVE
Glucose, UA: NEGATIVE mg/dL
Hgb urine dipstick: NEGATIVE
Leukocytes, UA: NEGATIVE
NITRITE: NEGATIVE
Protein, ur: 30 mg/dL — AB
SQUAMOUS EPITHELIAL / LPF: NONE SEEN
Specific Gravity, Urine: 1.02 (ref 1.005–1.030)
pH: 5 (ref 5.0–8.0)

## 2015-01-27 LAB — PROTIME-INR
INR: 1.86
Prothrombin Time: 21.6 seconds — ABNORMAL HIGH (ref 11.4–15.0)

## 2015-01-27 LAB — LACTIC ACID, PLASMA: LACTIC ACID, VENOUS: 0.8 mmol/L (ref 0.5–2.0)

## 2015-01-27 MED ORDER — METHYLPREDNISOLONE SODIUM SUCC 40 MG IJ SOLR
40.0000 mg | Freq: Every day | INTRAMUSCULAR | Status: DC
  Administered 2015-01-27 – 2015-01-28 (×2): 40 mg via INTRAVENOUS
  Filled 2015-01-27 (×2): qty 1

## 2015-01-27 MED ORDER — DOCUSATE SODIUM 100 MG PO CAPS
100.0000 mg | ORAL_CAPSULE | Freq: Two times a day (BID) | ORAL | Status: DC
  Administered 2015-01-27 (×2): 100 mg via ORAL
  Filled 2015-01-27 (×3): qty 1

## 2015-01-27 MED ORDER — HYDROCODONE-ACETAMINOPHEN 5-325 MG PO TABS
1.0000 | ORAL_TABLET | Freq: Four times a day (QID) | ORAL | Status: DC | PRN
  Administered 2015-01-27 – 2015-01-28 (×4): 1 via ORAL
  Filled 2015-01-27 (×3): qty 1

## 2015-01-27 MED ORDER — LEVOTHYROXINE SODIUM 25 MCG PO TABS
137.0000 ug | ORAL_TABLET | Freq: Every day | ORAL | Status: DC
  Administered 2015-01-28 – 2015-02-01 (×5): 137 ug via ORAL
  Filled 2015-01-27 (×11): qty 1

## 2015-01-27 NOTE — Progress Notes (Signed)
Patient ID: John Mendoza, male   DOB: Nov 18, 1945  SUBJECTIVE:  Admitted with acute resp failure; has h/o recurrent aspiration due to prior head and neck cancer treatment as well as positioning of neck due to cervical DDD; has CAD, COPD, DM, seizure disorder, Parkinson's, h/o MAI, h/o ANCA positive/MPA with prior treatment at [facility]. Had rapid onset current symptoms with temp 103; CXR with RML/RLL infiltrates, c/w aspiration.  Reports had been told at [facility] that he did not need to thicken liquids out of concern that coughing up the thickened liquids would create obstruction.  C/o significant back pain, worse due to cough.  No fall or injury.  ______________________________________________________________________  ROS: Please see HPI; remainder of complete 10 point ROS is negative   Past Medical History  Diagnosis Date  . Barrett esophagus   . Hypertension   . Hyperlipidemia   . Depression   . Multiple pulmonary nodules   . CHF (congestive heart failure)   . Lumbar spinal stenosis   . S/P CABG (coronary artery bypass graft)   . COPD (chronic obstructive pulmonary disease)   . CAD (coronary artery disease)   . Basal cell carcinoma   . Diabetes mellitus   . Cancer of neck   . Heart attack     Past Surgical History  Procedure Laterality Date  . Bypass graft    . Cervical fusion    . Radial neck dissection    . Multiple stents    . Total knee arthroplasty  2011  . Thyroidectomy  1996     Current facility-administered medications:  .  0.9 %  sodium chloride infusion, , Intravenous, Continuous, Demetrios Loll, MD, Last Rate: 75 mL/hr at 01/27/15 0700 .  acetaminophen (TYLENOL) tablet 650 mg, 650 mg, Oral, Q6H PRN, Harrie Foreman, MD, 650 mg at 01/26/15 2324 .  amLODipine (NORVASC) tablet 5 mg, 5 mg, Oral, Daily, Demetrios Loll, MD .  aspirin EC tablet 81 mg, 81 mg, Oral, Daily, Demetrios Loll, MD .  atorvastatin (LIPITOR) tablet 40 mg, 40 mg, Oral, QHS, Demetrios Loll, MD, 40 mg at 01/26/15 2325 .   azithromycin (ZITHROMAX) 500 mg in dextrose 5 % 250 mL IVPB, 500 mg, Intravenous, Q24H, Demetrios Loll, MD, 500 mg at 01/27/15 0030 .  bisoprolol (ZEBETA) tablet 10 mg, 10 mg, Oral, Daily, Demetrios Loll, MD .  carbidopa-levodopa (SINEMET IR) 25-100 MG per tablet immediate release 2 tablet, 2 tablet, Oral, TID, Demetrios Loll, MD, 2 tablet at 01/26/15 2324 .  clonazePAM (KLONOPIN) tablet 0.5 mg, 0.5 mg, Oral, TID PRN, Demetrios Loll, MD .  dextrose 5 % with cefTRIAXone (ROCEPHIN) ADS Med, , , ,  .  heparin injection 5,000 Units, 5,000 Units, Subcutaneous, 3 times per day, Demetrios Loll, MD, 5,000 Units at 01/27/15 0556 .  HYDROcodone-acetaminophen (NORCO/VICODIN) 5-325 MG per tablet 1 tablet, 1 tablet, Oral, Q8H PRN, Demetrios Loll, MD, 1 tablet at 01/27/15 0750 .  isosorbide mononitrate (IMDUR) 24 hr tablet 30 mg, 30 mg, Oral, Daily, Demetrios Loll, MD .  lamoTRIgine (LAMICTAL) tablet 25 mg, 25 mg, Oral, BID, Demetrios Loll, MD, 25 mg at 01/26/15 2331 .  levalbuterol (XOPENEX) nebulizer solution 1.25 mg, 1.25 mg, Nebulization, 4 times per day, Demetrios Loll, MD, 1.25 mg at 01/27/15 0003 .  levothyroxine (SYNTHROID, LEVOTHROID) tablet 137 mcg, 137 mcg, Oral, QAC breakfast, Tama High III, MD .  losartan (COZAAR) tablet 100 mg, 100 mg, Oral, Daily, Demetrios Loll, MD .  methylPREDNISolone sodium succinate (SOLU-MEDROL) 40 mg/mL injection 40  mg, 40 mg, Intravenous, Daily, Tama High III, MD .  pantoprazole (PROTONIX) EC tablet 40 mg, 40 mg, Oral, Daily, Demetrios Loll, MD .  PARoxetine (PAXIL) tablet 60 mg, 60 mg, Oral, Daily, Demetrios Loll, MD .  piperacillin-tazobactam (ZOSYN) IVPB 3.375 g, 3.375 g, Intravenous, 3 times per day, Demetrios Loll, MD, 3.375 g at 01/27/15 0458 .  tamsulosin (FLOMAX) capsule 0.4 mg, 0.4 mg, Oral, Daily, Demetrios Loll, MD .  tiZANidine (ZANAFLEX) tablet 4 mg, 4 mg, Oral, Q8H PRN, Demetrios Loll, MD, 4 mg at 01/27/15 0749 .  vancomycin (VANCOCIN) 1,250 mg in sodium chloride 0.9 % 250 mL IVPB, 1,250 mg, Intravenous, Q12H, Demetrios Loll, MD,  1,250 mg at 01/27/15 0308 .  warfarin (COUMADIN) tablet 5 mg, 5 mg, Oral, q1800, Demetrios Loll, MD, 5 mg at 01/26/15 2331  PHYSICAL EXAM:  BP 134/76 mmHg  Pulse 61  Temp(Src) 98.3 F (36.8 C) (Axillary)  Resp 24  Ht 5' 8.5" (1.74 m)  Wt 88.111 kg (194 lb 4 oz)  BMI 29.10 kg/m2  SpO2 100%  General: pleasant  male, in NAD HEENT: PERRL; OP dry without lesions.  Papular rash on back of head Neck: supple, trachea midline, no thyromegaly Chest: normal to palpation Lungs: coarse rales without retractions or wheezes Cardiovascular: RRR,  no gallop; distal pulses 2+ Abdomen: soft, nontender, nondistended, positive bowel sounds Extremities: no clubbing, cyanosis, edema Neuro: alert and oriented, moves all extremities.  Chronic hoarseness.  Tremor in right hand Derm:  good skin turgor Lymph: no cervical or supraclavicular lymphadenopathy  Labs and imaging studies were reviewed  ASSESSMENT/PLAN:   1. Aspiration pneumonitis/acute resp failure/sepsis- cont abx; sputum cx if able.  Cont inhaled meds for this and for COPD.  Will add solumedrol for acute COPD exacerbation as well as back pain 2. DDD- pain meds, muscle relaxant, kpad.  If not improving, will need further imaging.  Neuro exam does not suggest impingement currently 3. DM- cover with SSI; pt aware FSBS will be higher on steroids 4. Parkinsons- cont home meds 5. CAD- monitor HR/BP 6. 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  Watch CBC

## 2015-01-27 NOTE — Plan of Care (Signed)
No fever today. Dr. [name] in and patient very comfortable with plan of care. Medicated x 2 for back pain-chronic. Requires lots of assistance to be mobile in bed. Has rested a lot today due to turning q 2 hours and constant repositioning. Enjoys frequent attention and reassurance.

## 2015-01-27 NOTE — Evaluation (Signed)
Physical Therapy Evaluation Patient Details Name: John Mendoza MRN: [id number] DOB: 06/02/1946 Today's Date: 01/27/2015   History of Present Illness  presented to ER secondary to progressive cough, SOB and generalized weakness; admitted with bilateral multilobar PNA with sepsis (requiring BiPAP upon arrival), now on 3L supplemental O2 via [place].  Clinical Impression  Upon evaluation, patient alert and oriented, follows all commands.  Agreeable to participation with session, but limits activity to bed-level activity only due to back pain.  Demonstrates strength and ROM grossly WFL throughout all extremities, at least 4/5.  Does demonstrate sustained, multi-beat clonus to bilat ankles; negative Babinski bilat.  RN informed/aware.  Educated in assisted-cough techniques and encouraged to hug pillow with coughing as needed to promote pulmonary toilet; patient voiced understanding.  Will plan to encourage/attempt OOB transfers next session as able. Would benefit from skilled PT to address above deficits and promote optimal return to PLOF; recommend transition to STR upon discharge from acute hospitalization.  Will continue to assess/update as additional mobility assessment completed.    Follow Up Recommendations SNF    Equipment Recommendations  Rolling walker with 5" wheels    Recommendations for Other Services       Precautions / Restrictions Precautions Precautions: Fall Restrictions Weight Bearing Restrictions: No      Mobility  Bed Mobility               General bed mobility comments: patient declined mobility/OOB attempts this date secondary to back pain; agreeable to supine therex only  Transfers                 General transfer comment: patient declined mobility/OOB attempts this date secondary to back pain; agreeable to supine therex only  Ambulation/Gait             General Gait Details: patient declined mobility/OOB attempts this date secondary to back  pain; agreeable to supine therex only  Stairs            Wheelchair Mobility    Modified Rankin (Stroke Patients Only)       Balance                                             Pertinent Vitals/Pain Pain Assessment: Faces Pain Score: 8  Pain Location: back Pain Descriptors / Indicators: Aching;Sore;Spasm Pain Intervention(s): Limited activity within patient's tolerance;Monitored during session;Repositioned    Home Living Family/patient expects to be discharged to:: Private residence Living Arrangements: Spouse/significant other Available Help at Discharge: Family Type of Home: House Home Access: Stairs to enter Entrance Stairs-Rails: Right Entrance Stairs-Number of Steps: 3 Home Layout: One level Home Equipment: Cane - single point      Prior Function Level of Independence: Independent         Comments: Intermittent use of SPC "when back hurts".  Denies fall history.     Hand Dominance        Extremity/Trunk Assessment   Upper Extremity Assessment: Overall WFL for tasks assessed           Lower Extremity Assessment: Overall WFL for tasks assessed (grossly at least 4/5 bilat; denies sensory deficit to bilat LEs.  Positive clonus noted bilat ankles (multi-beat, sutained); negative Babinski)         Communication   Communication: No difficulties  Cognition Arousal/Alertness: Awake/alert Behavior During Therapy: WFL for tasks assessed/performed Overall Cognitive  Status: Within Functional Limits for tasks assessed                      General Comments      Exercises Other Exercises Other Exercises: Supine bilat UE therex, 1x10: shoulder flex/ext, horiz abduct/adduct, rowing/scapular retraction; supine LE therex, 1x10: ankle pumps, SAQs, heel slides, hip abduct/adduct and SLR (limited to 5 due to pain).  Educated in assisted coughing techniques and encouraged to hug pillow during cough for comfort/assist.  Patient  voiced understanding. (12 minutes)      Assessment/Plan    PT Assessment Patient needs continued PT services  PT Diagnosis Generalized weakness;Difficulty walking;Acute pain   PT Problem List Decreased strength;Decreased range of motion;Decreased activity tolerance;Decreased balance;Decreased mobility;Pain;Decreased knowledge of use of DME;Decreased safety awareness;Decreased knowledge of precautions;Cardiopulmonary status limiting activity  PT Treatment Interventions DME instruction;Gait training;Functional mobility training;Therapeutic activities;Therapeutic exercise;Stair training;Patient/family education   PT Goals (Current goals can be found in the Care Plan section) Acute Rehab PT Goals Patient Stated Goal: "to go back home" PT Goal Formulation: With patient Time For Goal Achievement: 02/10/15 Potential to Achieve Goals: Good    Frequency Min 2X/week   Barriers to discharge        Co-evaluation               End of Session   Activity Tolerance: Patient limited by pain Patient left: in bed;with call bell/phone within reach Nurse Communication: Mobility status         Time: 0626-9485 PT Time Calculation (min) (ACUTE ONLY): 25 min   Charges:   PT Evaluation $Initial PT Evaluation Tier I: 1 Procedure PT Treatments $Therapeutic Exercise: 8-22 mins   PT G Codes:       Darcelle Herrada H. Owens Shark, PT, DPT, NCS 01/27/2015, 11:48 AM [phone]

## 2015-01-27 NOTE — Progress Notes (Signed)
ANTICOAGULATION CONSULT NOTE - Initial Consult  Pharmacy Consult for Coumadin Indication: h/o DVT?  Allergies  Allergen Reactions  . Other Other (See Comments)    Pt states that inhaled medications make him depressed and have negative thoughts.      Patient Measurements: Height: 5' 8.5" (174 cm) Weight: 194 lb 4 oz (88.111 kg) IBW/kg (Calculated) : 69.55   Vital Signs: Temp: 98.3 F (36.8 C) (07/23 0700) BP: 133/66 mmHg (07/23 0800) Pulse Rate: 76 (07/23 0800)  Labs:  Recent Labs  01/26/15 1946 01/27/15 0600  HGB 10.8*  --   HCT 34.2*  --   PLT 182  --   LABPROT 21.7* 21.6*  INR 1.87 1.86  CREATININE 0.85  --   TROPONINI 0.03  --     Estimated Creatinine Clearance: 89.3 mL/min (by C-G formula based on Cr of 0.85).   Medical History: Past Medical History  Diagnosis Date  . Barrett esophagus   . Hypertension   . Hyperlipidemia   . Depression   . Multiple pulmonary nodules   . CHF (congestive heart failure)   . Lumbar spinal stenosis   . S/P CABG (coronary artery bypass graft)   . COPD (chronic obstructive pulmonary disease)   . CAD (coronary artery disease)   . Basal cell carcinoma   . Diabetes mellitus   . Cancer of neck   . Heart attack     Medications:  Prescriptions prior to admission  Medication Sig Dispense Refill Last Dose  . amLODipine (NORVASC) 5 MG tablet Take 5 mg by mouth daily.   unknown at unknown  . aspirin EC 81 MG tablet Take 81 mg by mouth daily.   unknown at unknown  . atorvastatin (LIPITOR) 40 MG tablet Take 40 mg by mouth at bedtime.   unknown at unknown  . bisoprolol (ZEBETA) 10 MG tablet Take 10 mg by mouth daily.   unknown at unknown  . carbidopa-levodopa (SINEMET IR) 25-100 MG per tablet Take 2 tablets by mouth 3 (three) times daily.    unknown at unknown  . clonazePAM (KLONOPIN) 1 MG tablet Take 0.5 mg by mouth 3 (three) times daily as needed for anxiety.   PRN at PRN  . HYDROcodone-acetaminophen (NORCO/VICODIN) 5-325 MG per  tablet Take 1 tablet by mouth every 8 (eight) hours as needed for moderate pain.   PRN at PRN  . ipratropium-albuterol (DUONEB) 0.5-2.5 (3) MG/3ML SOLN Take 3 mLs by nebulization every 6 (six) hours as needed (for shortness of breath).    PRN at PRN  . isosorbide mononitrate (IMDUR) 30 MG 24 hr tablet Take 30 mg by mouth daily.   unknown at unknown  . lamoTRIgine (LAMICTAL) 25 MG tablet Take 25 mg by mouth 2 (two) times daily.   unknown at unknown  . levothyroxine (SYNTHROID, LEVOTHROID) 137 MCG tablet Take 137 mcg by mouth daily.   unknown at unknown  . losartan (COZAAR) 100 MG tablet Take 100 mg by mouth daily.   unknown at unknown  . metFORMIN (GLUCOPHAGE) 500 MG tablet Take 500 mg by mouth 2 (two) times daily.    unknown at unknown  . omeprazole (PRILOSEC) 20 MG capsule Take 40 mg by mouth 2 (two) times daily.   unknown at unknown  . PARoxetine (PAXIL) 20 MG tablet Take 60 mg by mouth daily.    unknown at unknown  . Sennosides 25 MG TABS Take 1 tablet by mouth as needed (for constipation).   PRN at PRN  . tamsulosin (FLOMAX)  0.4 MG CAPS Take 0.4 mg by mouth daily.    unknown at unknown  . tiZANidine (ZANAFLEX) 4 MG tablet Take 4 mg by mouth every 8 (eight) hours as needed for muscle spasms.   PRN at PRN  . warfarin (COUMADIN) 4 MG tablet Take 4 mg by mouth every evening.    unknown at unknown  . budesonide (PULMICORT) 0.25 MG/2ML nebulizer solution Take 2 mLs (0.25 mg total) by nebulization as needed. (Patient not taking: Reported on 07/12/2014) 60 mL 5 Not Taking   Scheduled:  . amLODipine  5 mg Oral Daily  . aspirin EC  81 mg Oral Daily  . atorvastatin  40 mg Oral QHS  . azithromycin  500 mg Intravenous Q24H  . bisoprolol  10 mg Oral Daily  . carbidopa-levodopa  2 tablet Oral TID  . docusate sodium  100 mg Oral BID  . isosorbide mononitrate  30 mg Oral Daily  . lamoTRIgine  25 mg Oral BID  . levalbuterol  1.25 mg Nebulization 4 times per day  . levothyroxine  137 mcg Oral QAC breakfast   . losartan  100 mg Oral Daily  . methylPREDNISolone (SOLU-MEDROL) injection  40 mg Intravenous Daily  . pantoprazole  40 mg Oral Daily  . PARoxetine  60 mg Oral Daily  . piperacillin-tazobactam (ZOSYN)  IV  3.375 g Intravenous 3 times per day  . tamsulosin  0.4 mg Oral Daily  . vancomycin  1,250 mg Intravenous Q12H  . warfarin  5 mg Oral q1800   Infusions:  . sodium chloride 75 mL/hr at 01/27/15 0800   PRN: acetaminophen, clonazePAM, HYDROcodone-acetaminophen, tiZANidine  Assessment: Patient on Coumadin 4 mg daily PTA presumably for h/o DVT with almost therapeutic INR.  Pt also started on abx (azithromycin, vancomycin, Zosyn)  7/22 INR 1.87 - warfarin 5 mg 7/23 INR 1.86   Goal of Therapy:  INR 2-3    Plan:  Will continue Coumadin 5 mg daily for today as INR still subtherapeutic and f/u AM INR.   Rocky Morel 01/27/2015,9:05 AM

## 2015-01-27 NOTE — Progress Notes (Signed)
ANTIBIOTIC CONSULT NOTE - Follow up  Pharmacy Consult for Vancomycin/Zosyn/Azithromycin Indication: pneumonia  Allergies  Allergen Reactions  . Other Other (See Comments)    Pt states that inhaled medications make him depressed and have negative thoughts.      Patient Measurements: Height: 5' 8.5" (174 cm) Weight: 194 lb 4 oz (88.111 kg) IBW/kg (Calculated) : 69.55 Adjusted Body Weight: 76.3 kg  Vital Signs: Temp: 98.3 F (36.8 C) (07/23 0700) BP: 96/73 mmHg (07/23 0900) Pulse Rate: 73 (07/23 0900) Intake/Output from previous day: 07/22 0701 - 07/23 0700 In: 3050 [P.O.:200; I.V.:2300; IV Piggyback:550] Out: 700 [Urine:700] Intake/Output from this shift: Total I/O In: 12.5 [IV Piggyback:12.5] Out: -   Labs:  Recent Labs  01/26/15 1946  WBC 13.9*  HGB 10.8*  PLT 182  CREATININE 0.85   Estimated Creatinine Clearance: 89.3 mL/min (by C-G formula based on Cr of 0.85). No results for input(s): VANCOTROUGH, VANCOPEAK, VANCORANDOM, GENTTROUGH, GENTPEAK, GENTRANDOM, TOBRATROUGH, TOBRAPEAK, TOBRARND, AMIKACINPEAK, AMIKACINTROU, AMIKACIN in the last 72 hours.   Microbiology: Recent Results (from the past 720 hour(s))  Blood Culture (routine x 2)     Status: None (Preliminary result)   Collection Time: 01/26/15  7:46 PM  Result Value Ref Range Status   Specimen Description BLOOD LEFT ASSIST CONTROL  Final   Special Requests BOTTLES DRAWN AEROBIC AND ANAEROBIC 5CC  Final   Culture  Setup Time   Final    GRAM POSITIVE COCCI IN CHAINS IN BOTH AEROBIC AND ANAEROBIC BOTTLES CRITICAL RESULT CALLED TO, READ BACK BY AND VERIFIED WITH: MYRA FLOWERS,RN AT 0800 01/27/2015 BY JRS    Culture   Final    GRAM POSITIVE COCCI IN CHAINS IDENTIFICATION TO FOLLOW    Report Status PENDING  Incomplete  MRSA PCR Screening     Status: None   Collection Time: 01/26/15 10:00 PM  Result Value Ref Range Status   MRSA by PCR NEGATIVE NEGATIVE Final    Comment:        The GeneXpert MRSA Assay  (FDA approved for NASAL specimens only), is one component of a comprehensive MRSA colonization surveillance program. It is not intended to diagnose MRSA infection nor to guide or monitor treatment for MRSA infections.     Medical History: Past Medical History  Diagnosis Date  . Barrett esophagus   . Hypertension   . Hyperlipidemia   . Depression   . Multiple pulmonary nodules   . CHF (congestive heart failure)   . Lumbar spinal stenosis   . S/P CABG (coronary artery bypass graft)   . COPD (chronic obstructive pulmonary disease)   . CAD (coronary artery disease)   . Basal cell carcinoma   . Diabetes mellitus   . Cancer of neck   . Heart attack     Medications:  Scheduled:  . amLODipine  5 mg Oral Daily  . aspirin EC  81 mg Oral Daily  . atorvastatin  40 mg Oral QHS  . azithromycin  500 mg Intravenous Q24H  . bisoprolol  10 mg Oral Daily  . carbidopa-levodopa  2 tablet Oral TID  . docusate sodium  100 mg Oral BID  . isosorbide mononitrate  30 mg Oral Daily  . lamoTRIgine  25 mg Oral BID  . levalbuterol  1.25 mg Nebulization 4 times per day  . levothyroxine  137 mcg Oral QAC breakfast  . losartan  100 mg Oral Daily  . methylPREDNISolone (SOLU-MEDROL) injection  40 mg Intravenous Daily  . pantoprazole  40 mg Oral  Daily  . PARoxetine  60 mg Oral Daily  . piperacillin-tazobactam (ZOSYN)  IV  3.375 g Intravenous 3 times per day  . tamsulosin  0.4 mg Oral Daily  . vancomycin  1,250 mg Intravenous Q12H  . warfarin  5 mg Oral q1800   Infusions:  . sodium chloride 75 mL/hr at 01/27/15 0800   PRN: acetaminophen, clonazePAM, HYDROcodone-acetaminophen, tiZANidine  Assessment: 69 y/o M ordered empiric abx for PNA, ,likely due to aspiration.   Goal of Therapy:  Vancomycin trough level 15-20 mcg/ml  Plan:  1. Vancomycin 1000 mg iv once given in ED. Will order vancomycin 1250 mg iv q 12 h with stacked dosing and a trough with the 5th dose (7/25 at 0130).   2. Will  continue Zosyn 3.375 g EI q 8 h.   3. Will continue Azithromycin 500 mg iv q 24 h. WBC 13.9  Measure antibiotic drug levels at steady state Follow up culture results  Will need to continue to monitor renal function  Rocky Morel 01/27/2015,9:24 AM

## 2015-01-27 NOTE — Progress Notes (Signed)
Initial Nutrition Assessment    INTERVENTION:  Meals and Snacks: Cater to patient preferences Medical Food Supplement Therapy: pt would like to try nutritional supplement, at present with current thickened liquid order, recommend mighty shake with meals  NUTRITION DIAGNOSIS:   Swallowing difficulty related to dysphagia as evidenced by  (recurrent aspiration).   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 Barrett esophagus   . Hypertension   . Hyperlipidemia   . Depression   . Multiple pulmonary nodules   . CHF (congestive heart failure)   . Lumbar spinal stenosis   . S/P CABG (coronary artery bypass graft)   . COPD (chronic obstructive pulmonary disease)   . CAD (coronary artery disease)   . Basal cell carcinoma   . Diabetes mellitus   . Cancer of neck   . Heart attack      Diet Order:  DIET SOFT Room service appropriate?: Yes; Fluid consistency:: Nectar Thick   Energy Intake: pt reports he ate good breakfast this AM, no recorded po intake  Food Nutrition Related History: pt reports he has fairly good appetite at home, typically eats 2 meals per day at home; big bowl of cereal and banana at 11am and then eats good dinner. Pt reports he has not been doing thickened liquids at home because [facility] had told him not to. Pt does not use nutritional shakes at home like Ensure, does take MVI  Electrolyte and Renal Profile:  Recent Labs Lab 01/26/15 1946  BUN 16  CREATININE 0.85  NA 133*  K 3.8   Glucose Profile:  Recent Labs  01/26/15 2141  GLUCAP 173*   Protein Profile:  Recent Labs Lab 01/26/15 1946  ALBUMIN 3.6   Meds: NS at 75  ml/hr  Nutrition focused physical exam: Nutrition-Focused physical exam completed. Findings are WDL for fat depletion, muscle depletion, and edema.    Skin:  Reviewed, no issues  Height:   Ht Readings from Last 1 Encounters:  01/26/15 5' 8.5" (1.74 m)    Weight: pt reports weight has been stable; noted last weight from January as documented below  Wt Readings from Last 1 Encounters:  01/26/15 194 lb 4 oz (88.111 kg)    Ideal Body Weight:     Wt Readings from Last 10 Encounters:  01/26/15 194 lb 4 oz (88.111 kg)  07/12/14 191 lb (86.637 kg)  02/20/14 192 lb (87.091 kg)  11/03/13 191 lb (86.637 kg)  09/02/13 187 lb (84.823 kg)  03/29/13 207 lb (93.895 kg)  02/08/13 207 lb (93.895 kg)  01/26/13 209 lb 3.2 oz (94.892 kg)  08/03/12 203 lb (92.08 kg)  05/07/12 199 lb (90.266 kg)    BMI:  Body mass index is 29.1 kg/(m^2).  LOW Care Level  Kerman Passey MS, [place], LDN [phone] Pager

## 2015-01-28 ENCOUNTER — Inpatient Hospital Stay: Payer: Medicare Other

## 2015-01-28 DIAGNOSIS — R7881 Bacteremia: Secondary | ICD-10-CM | POA: Diagnosis present

## 2015-01-28 DIAGNOSIS — B951 Streptococcus, group B, as the cause of diseases classified elsewhere: Secondary | ICD-10-CM | POA: Diagnosis present

## 2015-01-28 LAB — COMPREHENSIVE METABOLIC PANEL
ALT: <5 U/L — ABNORMAL LOW (ref 17–63)
ANION GAP: 5 (ref 5–15)
AST: 22 U/L (ref 15–41)
Albumin: 2.9 g/dL — ABNORMAL LOW (ref 3.5–5.0)
Alkaline Phosphatase: 66 U/L (ref 38–126)
BILIRUBIN TOTAL: 0.4 mg/dL (ref 0.3–1.2)
BUN: 16 mg/dL (ref 6–20)
CHLORIDE: 102 mmol/L (ref 101–111)
CO2: 29 mmol/L (ref 22–32)
CREATININE: 0.79 mg/dL (ref 0.61–1.24)
Calcium: 8.5 mg/dL — ABNORMAL LOW (ref 8.9–10.3)
GFR calc Af Amer: >60 mL/min (ref >60)
Glucose, Bld: 196 mg/dL — ABNORMAL HIGH (ref 65–99)
Potassium: 4.4 mmol/L (ref 3.5–5.1)
Sodium: 136 mmol/L (ref 135–145)
Total Protein: 6.4 g/dL — ABNORMAL LOW (ref 6.5–8.1)

## 2015-01-28 LAB — EXPECTORATED SPUTUM ASSESSMENT W GRAM STAIN, RFLX TO RESP C

## 2015-01-28 LAB — PROTIME-INR
INR: 2.79
Prothrombin Time: 29.5 seconds — ABNORMAL HIGH (ref 11.4–15.0)

## 2015-01-28 LAB — CBC
HEMATOCRIT: 30.5 % — AB (ref 40.0–52.0)
HEMOGLOBIN: 9.6 g/dL — AB (ref 13.0–18.0)
MCH: 23.8 pg — AB (ref 26.0–34.0)
MCHC: 31.3 g/dL — ABNORMAL LOW (ref 32.0–36.0)
MCV: 76 fL — AB (ref 80.0–100.0)
Platelets: 171 10*3/uL (ref 150–440)
RBC: 4.02 MIL/uL — AB (ref 4.40–5.90)
RDW: 18.9 % — ABNORMAL HIGH (ref 11.5–14.5)
WBC: 10.1 10*3/uL (ref 3.8–10.6)

## 2015-01-28 LAB — URINE CULTURE: Culture: NO GROWTH

## 2015-01-28 LAB — EXPECTORATED SPUTUM ASSESSMENT W REFEX TO RESP CULTURE

## 2015-01-28 MED ORDER — OXYCODONE-ACETAMINOPHEN 5-325 MG PO TABS
1.0000 | ORAL_TABLET | Freq: Four times a day (QID) | ORAL | Status: DC | PRN
  Administered 2015-01-28 – 2015-02-01 (×14): 1 via ORAL
  Filled 2015-01-28 (×14): qty 1

## 2015-01-28 MED ORDER — PREDNISONE 20 MG PO TABS
30.0000 mg | ORAL_TABLET | Freq: Every day | ORAL | Status: DC
  Administered 2015-01-29 – 2015-01-31 (×3): 30 mg via ORAL
  Filled 2015-01-28 (×3): qty 1

## 2015-01-28 MED ORDER — WARFARIN - PHARMACIST DOSING INPATIENT
Freq: Every day | Status: DC
  Administered 2015-01-28: 18:00:00

## 2015-01-28 MED ORDER — DOCUSATE SODIUM 100 MG PO CAPS
100.0000 mg | ORAL_CAPSULE | Freq: Two times a day (BID) | ORAL | Status: DC
  Administered 2015-01-28 – 2015-02-01 (×7): 100 mg via ORAL
  Filled 2015-01-28 (×8): qty 1

## 2015-01-28 NOTE — Progress Notes (Signed)
ANTICOAGULATION CONSULT NOTE - Initial Consult  Pharmacy Consult for Coumadin Indication: h/o DVT  Allergies  Allergen Reactions  . Other Other (See Comments)    Pt states that inhaled medications make him depressed and have negative thoughts.      Patient Measurements: Height: 5' 8.5" (174 cm) Weight: 194 lb 4 oz (88.111 kg) IBW/kg (Calculated) : 69.55   Vital Signs: Temp: 97.7 F (36.5 C) (07/24 0727) Temp Source: Oral (07/24 0727) BP: 148/79 mmHg (07/24 0727) Pulse Rate: 68 (07/24 0738)  Labs:  Recent Labs  01/26/15 1946 01/27/15 0600 01/28/15 0454  HGB 10.8*  --  9.6*  HCT 34.2*  --  30.5*  PLT 182  --  171  LABPROT 21.7* 21.6* 29.5*  INR 1.87 1.86 2.79  CREATININE 0.85  --  0.79  TROPONINI 0.03  --   --     Estimated Creatinine Clearance: 94.9 mL/min (by C-G formula based on Cr of 0.79).   Medical History: Past Medical History  Diagnosis Date  . Barrett esophagus   . Hypertension   . Hyperlipidemia   . Depression   . Multiple pulmonary nodules   . CHF (congestive heart failure)   . Lumbar spinal stenosis   . S/P CABG (coronary artery bypass graft)   . COPD (chronic obstructive pulmonary disease)   . CAD (coronary artery disease)   . Basal cell carcinoma   . Diabetes mellitus   . Cancer of neck   . Heart attack     Medications:  Prescriptions prior to admission  Medication Sig Dispense Refill Last Dose  . amLODipine (NORVASC) 5 MG tablet Take 5 mg by mouth daily.   unknown at unknown  . aspirin EC 81 MG tablet Take 81 mg by mouth daily.   unknown at unknown  . atorvastatin (LIPITOR) 40 MG tablet Take 40 mg by mouth at bedtime.   unknown at unknown  . bisoprolol (ZEBETA) 10 MG tablet Take 10 mg by mouth daily.   unknown at unknown  . carbidopa-levodopa (SINEMET IR) 25-100 MG per tablet Take 2 tablets by mouth 3 (three) times daily.    unknown at unknown  . clonazePAM (KLONOPIN) 1 MG tablet Take 0.5 mg by mouth 3 (three) times daily as needed  for anxiety.   PRN at PRN  . HYDROcodone-acetaminophen (NORCO/VICODIN) 5-325 MG per tablet Take 1 tablet by mouth every 8 (eight) hours as needed for moderate pain.   PRN at PRN  . ipratropium-albuterol (DUONEB) 0.5-2.5 (3) MG/3ML SOLN Take 3 mLs by nebulization every 6 (six) hours as needed (for shortness of breath).    PRN at PRN  . isosorbide mononitrate (IMDUR) 30 MG 24 hr tablet Take 30 mg by mouth daily.   unknown at unknown  . lamoTRIgine (LAMICTAL) 25 MG tablet Take 25 mg by mouth 2 (two) times daily.   unknown at unknown  . levothyroxine (SYNTHROID, LEVOTHROID) 137 MCG tablet Take 137 mcg by mouth daily.   unknown at unknown  . losartan (COZAAR) 100 MG tablet Take 100 mg by mouth daily.   unknown at unknown  . metFORMIN (GLUCOPHAGE) 500 MG tablet Take 500 mg by mouth 2 (two) times daily.    unknown at unknown  . omeprazole (PRILOSEC) 20 MG capsule Take 40 mg by mouth 2 (two) times daily.   unknown at unknown  . PARoxetine (PAXIL) 20 MG tablet Take 60 mg by mouth daily.    unknown at unknown  . Sennosides 25 MG TABS Take 1  tablet by mouth as needed (for constipation).   PRN at PRN  . tamsulosin (FLOMAX) 0.4 MG CAPS Take 0.4 mg by mouth daily.    unknown at unknown  . tiZANidine (ZANAFLEX) 4 MG tablet Take 4 mg by mouth every 8 (eight) hours as needed for muscle spasms.   PRN at PRN  . warfarin (COUMADIN) 4 MG tablet Take 4 mg by mouth every evening.    unknown at unknown  . budesonide (PULMICORT) 0.25 MG/2ML nebulizer solution Take 2 mLs (0.25 mg total) by nebulization as needed. (Patient not taking: Reported on 07/12/2014) 60 mL 5 Not Taking   Scheduled:  . amLODipine  5 mg Oral Daily  . aspirin EC  81 mg Oral Daily  . atorvastatin  40 mg Oral QHS  . azithromycin  500 mg Intravenous Q24H  . bisoprolol  10 mg Oral Daily  . carbidopa-levodopa  2 tablet Oral TID  . docusate sodium  100 mg Oral BID  . isosorbide mononitrate  30 mg Oral Daily  . lamoTRIgine  25 mg Oral BID  . levalbuterol   1.25 mg Nebulization 4 times per day  . levothyroxine (SYNTHROID, LEVOTHROID) tablet 137 mcg  137 mcg Oral QAC breakfast  . losartan  100 mg Oral Daily  . methylPREDNISolone (SOLU-MEDROL) injection  40 mg Intravenous Daily  . pantoprazole  40 mg Oral Daily  . PARoxetine  60 mg Oral Daily  . piperacillin-tazobactam (ZOSYN)  IV  3.375 g Intravenous 3 times per day  . tamsulosin  0.4 mg Oral Daily  . vancomycin  1,250 mg Intravenous Q12H  . warfarin  5 mg Oral q1800   Infusions:  . sodium chloride 75 mL/hr at 01/28/15 0700   PRN: acetaminophen, clonazePAM, HYDROcodone-acetaminophen, tiZANidine  Assessment: Patient on Coumadin 4 mg daily PTA presumably for h/o DVT with almost therapeutic INR.  Pt also started on abx (azithromycin, vancomycin, Zosyn)  7/22 INR 1.87 - warfarin 5 mg 7/23 INR 1.86 - warfarin 5 mg 7/24 INR 2.79   Goal of Therapy:  INR 2-3    Plan:  INR therapeutic today but large increase in INR (almost a whole point), likely have not seen full effect of yesterday's dose. ?drug-drug interaction with azithromycin vs poorer PO intake. Will hold warfarin for today and f/u AM INR.   Rocky Morel 01/28/2015,7:46 AM

## 2015-01-28 NOTE — Progress Notes (Signed)
Pt is a x o x3. C/o back pain. PRN pain meds given as ordered. VS. UOP good. Getting o2 via [facility] 4Lmt. Sao2 96%. Lung sound rochi. Resting in bed without any distress. Continue to observe closely.

## 2015-01-28 NOTE — Care Management Note (Signed)
Case Management Note  Patient Details  Name: John Mendoza MRN: [id number] Date of Birth: 1946-07-05  Subjective/Objective:          69yo [name] was admitted from home on 01/26/15 per hypoxia and diagnosed with multilobiar pneumonia. On 01/28/15 blood cultures were positive for B Strep. PCP =Dr Ramonita Lab. Chronic home oxygen and chronic coumadin. Resides at home with [name], ph: [phone]. Hx of recurrent aspiration due to prior head and neck radiation for cancer. Also has Parkinsons disease. Numerous other medical diagnoses. Anticipate that he will return home with home health services.        Action/Plan:   Expected Discharge Date:  01/29/15 (unknown at this time)               Expected Discharge Plan:     In-House Referral:     Discharge planning Services     Post Acute Care Choice:    Choice offered to:     DME Arranged:    DME Agency:     HH Arranged:    [place] Agency:     Status of Service:     Medicare Important Message Given:    Date Medicare IM Given:    Medicare IM give by:    Date Additional Medicare IM Given:    Additional Medicare Important Message give by:     If discussed at [place] of Stay Meetings, dates discussed:    Additional Comments:  Kaitland Lewellyn A, RN 01/28/2015, 2:15 PM

## 2015-01-28 NOTE — Plan of Care (Signed)
Problem: Phase I Progression Outcomes Goal: Pain controlled with appropriate interventions Outcome: Progressing Back pain relief a "2-3" with norco. Dr [name] Increased to percocet.  Zanaflex for back spasms prn  Problem: Phase II Progression Outcomes Goal: Encourage coughing & deep breathing Outcome: Progressing Does well with cogh and deep breathing Goal: Wean O2 if indicated Outcome: Progressing Sats good on 2 L/[place] Goal: Pain controlled Outcome: Progressing Zanaflex and percocet ordered for back pain.  Spine xrays done today Goal: Review culture results with MD if needed Outcome: Progressing Sputum cx sent. On zosyn, vanco, and zithromax Goal: Tolerating diet Outcome: Progressing Taking nectar thick and pureed diet fairly well. Seems to swallow these foods without problems.  Takes meds whole in applesauce.

## 2015-01-28 NOTE — Progress Notes (Signed)
Patient transferred from CCU, oriented to room, fall, and safety contract reviewed. Focused assessment as charted. C/o back pain, will be followed with PRN meds. Skin intact, verified with Elna Breslow, RN. Wilnette Kales

## 2015-01-28 NOTE — Progress Notes (Signed)
ANTIBIOTIC CONSULT NOTE - Follow up  Pharmacy Consult for Vancomycin/Zosyn/Azithromycin Indication: pneumonia  Allergies  Allergen Reactions  . Other Other (See Comments)    Pt states that inhaled medications make him depressed and have negative thoughts.      Patient Measurements: Height: 5' 8.5" (174 cm) Weight: 194 lb 4 oz (88.111 kg) IBW/kg (Calculated) : 69.55 Adjusted Body Weight: 76.3 kg  Vital Signs: Temp: 97.7 F (36.5 C) (07/24 0727) Temp Source: Oral (07/24 0727) BP: 148/79 mmHg (07/24 0727) Pulse Rate: 68 (07/24 0738) Intake/Output from previous day: 07/23 0701 - 07/24 0700 In: 2782.5 [P.O.:80; I.V.:1875; IV Piggyback:827.5] Out: 1700 [Urine:1700] Intake/Output from this shift:    Labs:  Recent Labs  01/26/15 1946 01/28/15 0454  WBC 13.9* 10.1  HGB 10.8* 9.6*  PLT 182 171  CREATININE 0.85 0.79   Estimated Creatinine Clearance: 94.9 mL/min (by C-G formula based on Cr of 0.79). No results for input(s): VANCOTROUGH, VANCOPEAK, VANCORANDOM, GENTTROUGH, GENTPEAK, GENTRANDOM, TOBRATROUGH, TOBRAPEAK, TOBRARND, AMIKACINPEAK, AMIKACINTROU, AMIKACIN in the last 72 hours.   Microbiology: Recent Results (from the past 720 hour(s))  Blood Culture (routine x 2)     Status: None (Preliminary result)   Collection Time: 01/26/15  7:46 PM  Result Value Ref Range Status   Specimen Description BLOOD LEFT ASSIST CONTROL  Final   Special Requests BOTTLES DRAWN AEROBIC AND ANAEROBIC 5CC  Final   Culture  Setup Time   Final    GRAM POSITIVE COCCI IN CHAINS IN BOTH AEROBIC AND ANAEROBIC BOTTLES CRITICAL RESULT CALLED TO, READ BACK BY AND VERIFIED WITH: MYRA FLOWERS,RN AT 0800 01/27/2015 BY JRS    Culture   Final    GROUP B STREP(S.AGALACTIAE)ISOLATED SUSCEPTIBILITIES TO FOLLOW Virtually 100% of S. agalactiae (Group B) strains are susceptible to Penicillin.  For Penicillin-allergic patients, Erythromycin (85-95% sensitive) and Clindamycin (80% sensitive) are drugs of  choice. Contact microbiology lab to request sensitivities if  needed within 7 days.    Report Status PENDING  Incomplete  Blood Culture (routine x 2)     Status: None (Preliminary result)   Collection Time: 01/26/15  7:46 PM  Result Value Ref Range Status   Specimen Description BLOOD LEFT HAND  Final   Special Requests BOTTLES DRAWN AEROBIC AND ANAEROBIC 5CC  Final   Culture  Setup Time   Final    GRAM POSITIVE COCCI IN CHAINS IN BOTH AEROBIC AND ANAEROBIC BOTTLES CRITICAL VALUE NOTED.  VALUE IS CONSISTENT WITH PREVIOUSLY REPORTED AND CALLED VALUE.    Culture   Final    GROUP B STREP(S.AGALACTIAE)ISOLATED SUSCEPTIBILITIES TO FOLLOW Virtually 100% of S. agalactiae (Group B) strains are susceptible to Penicillin.  For Penicillin-allergic patients, Erythromycin (85-95% sensitive) and Clindamycin (80% sensitive) are drugs of choice. Contact microbiology lab to request sensitivities if  needed within 7 days.    Report Status PENDING  Incomplete  MRSA PCR Screening     Status: None   Collection Time: 01/26/15 10:00 PM  Result Value Ref Range Status   MRSA by PCR NEGATIVE NEGATIVE Final    Comment:        The GeneXpert MRSA Assay (FDA approved for NASAL specimens only), is one component of a comprehensive MRSA colonization surveillance program. It is not intended to diagnose MRSA infection nor to guide or monitor treatment for MRSA infections.     Medical History: Past Medical History  Diagnosis Date  . Barrett esophagus   . Hypertension   . Hyperlipidemia   . Depression   .  Multiple pulmonary nodules   . CHF (congestive heart failure)   . Lumbar spinal stenosis   . S/P CABG (coronary artery bypass graft)   . COPD (chronic obstructive pulmonary disease)   . CAD (coronary artery disease)   . Basal cell carcinoma   . Diabetes mellitus   . Cancer of neck   . Heart attack     Medications:  Scheduled:  . amLODipine  5 mg Oral Daily  . aspirin EC  81 mg Oral Daily  .  atorvastatin  40 mg Oral QHS  . azithromycin  500 mg Intravenous Q24H  . bisoprolol  10 mg Oral Daily  . carbidopa-levodopa  2 tablet Oral TID  . docusate sodium  100 mg Oral BID  . isosorbide mononitrate  30 mg Oral Daily  . lamoTRIgine  25 mg Oral BID  . levalbuterol  1.25 mg Nebulization 4 times per day  . levothyroxine (SYNTHROID, LEVOTHROID) tablet 137 mcg  137 mcg Oral QAC breakfast  . losartan  100 mg Oral Daily  . methylPREDNISolone (SOLU-MEDROL) injection  40 mg Intravenous Daily  . pantoprazole  40 mg Oral Daily  . PARoxetine  60 mg Oral Daily  . piperacillin-tazobactam (ZOSYN)  IV  3.375 g Intravenous 3 times per day  . tamsulosin  0.4 mg Oral Daily  . vancomycin  1,250 mg Intravenous Q12H  . Warfarin - Pharmacist Dosing Inpatient   Does not apply q1800   Infusions:  . sodium chloride 75 mL/hr at 01/28/15 0700   PRN: acetaminophen, clonazePAM, HYDROcodone-acetaminophen, tiZANidine  Assessment: 69 y/o M ordered empiric abx for PNA, likely due to aspiration.   Goal of Therapy:  Vancomycin trough level 15-20 mcg/ml  Plan:  1. Vancomycin 1000 mg iv once given in ED. Will order vancomycin 1250 mg iv q 12 h with stacked dosing and a trough with the 5th dose (7/25 at 0130). Renal function stable from yesterday.   2. Will continue Zosyn 3.375 g EI q 8 h.   3. Will continue Azithromycin 500 mg iv q 24 h.   Measure antibiotic drug levels at steady state Follow up culture results  Will need to continue to monitor renal function  Rayna Sexton L 01/28/2015,7:57 AM

## 2015-01-28 NOTE — Progress Notes (Signed)
Patient ID: John Mendoza, male   DOB: [birth year]  SUBJECTIVE:  Admitted with acute resp failure; has h/o recurrent aspiration due to prior head and neck cancer treatment as well as positioning of neck due to cervical DDD; has CAD, COPD, DM, seizure disorder, Parkinson's, h/o MAI, h/o ANCA positive/MPA with prior treatment at [facility]. Had rapid onset current symptoms with temp 103; CXR with RML/RLL infiltrates, c/w aspiration.  Reports had been told at [facility] that he did not need to thicken liquids out of concern that coughing up the thickened liquids would create obstruction.  Breathing improved, but still with cough, and having severe back pain with cough.  BP down this AM, but typical for pt and AM blood pressures.  No chest pain.  Blood cultures are positive for group B strep.    ______________________________________________________________________  ROS: Please see HPI; remainder of complete 10 point ROS is negative   Past Medical History  Diagnosis Date  . Barrett esophagus   . Hypertension   . Hyperlipidemia   . Depression   . Multiple pulmonary nodules   . CHF (congestive heart failure)   . Lumbar spinal stenosis   . S/P CABG (coronary artery bypass graft)   . COPD (chronic obstructive pulmonary disease)   . CAD (coronary artery disease)   . Basal cell carcinoma   . Diabetes mellitus   . Cancer of neck   . Heart attack     Past Surgical History  Procedure Laterality Date  . Bypass graft    . Cervical fusion    . Radial neck dissection    . Multiple stents    . Total knee arthroplasty  2011  . Thyroidectomy  1996     Current facility-administered medications:  .  0.9 %  sodium chloride infusion, , Intravenous, Continuous, Demetrios Loll, MD, Last Rate: 75 mL/hr at 01/28/15 1100 .  acetaminophen (TYLENOL) tablet 650 mg, 650 mg, Oral, Q6H PRN, Harrie Foreman, MD, 650 mg at 01/26/15 2324 .  aspirin EC tablet 81 mg, 81 mg, Oral, Daily, Demetrios Loll, MD, 81 mg at 01/28/15 3016 .   atorvastatin (LIPITOR) tablet 40 mg, 40 mg, Oral, QHS, Demetrios Loll, MD, 40 mg at 01/27/15 2120 .  bisoprolol (ZEBETA) tablet 10 mg, 10 mg, Oral, Daily, Demetrios Loll, MD, 10 mg at 01/27/15 1042 .  carbidopa-levodopa (SINEMET IR) 25-100 MG per tablet immediate release 2 tablet, 2 tablet, Oral, TID, Demetrios Loll, MD, 2 tablet at 01/28/15 [phone] .  clonazePAM (KLONOPIN) tablet 0.5 mg, 0.5 mg, Oral, TID PRN, Demetrios Loll, MD, 0.5 mg at 01/27/15 2120 .  docusate sodium (COLACE) capsule 100 mg, 100 mg, Oral, BID, Tama High III, MD, 100 mg at 01/27/15 2120 .  HYDROcodone-acetaminophen (NORCO/VICODIN) 5-325 MG per tablet 1 tablet, 1 tablet, Oral, Q6H PRN, Tama High III, MD, 1 tablet at 01/28/15 1048 .  isosorbide mononitrate (IMDUR) 24 hr tablet 30 mg, 30 mg, Oral, Daily, Demetrios Loll, MD, 30 mg at 01/27/15 1000 .  lamoTRIgine (LAMICTAL) tablet 25 mg, 25 mg, Oral, BID, Demetrios Loll, MD, 25 mg at 01/28/15 0933 .  levalbuterol (XOPENEX) nebulizer solution 1.25 mg, 1.25 mg, Nebulization, 4 times per day, Demetrios Loll, MD, 1.25 mg at 01/28/15 0736 .  levothyroxine (SYNTHROID, LEVOTHROID) tablet 137 mcg, 137 mcg, Oral, QAC breakfast, Tama High III, MD, 137 mcg at 01/28/15 0745 .  pantoprazole (PROTONIX) EC tablet 40 mg, 40 mg, Oral, Daily, Demetrios Loll, MD, 40 mg at 01/28/15 3235 .  PARoxetine (PAXIL) tablet 60 mg, 60 mg, Oral, Daily, Demetrios Loll, MD, 60 mg at 01/28/15 0924 .  piperacillin-tazobactam (ZOSYN) IVPB 3.375 g, 3.375 g, Intravenous, 3 times per day, Demetrios Loll, MD, 3.375 g at 01/28/15 0600 .  [START ON 01/29/2015] predniSONE (DELTASONE) tablet 30 mg, 30 mg, Oral, Q breakfast, Tama High III, MD .  tamsulosin [facility]) capsule 0.4 mg, 0.4 mg, Oral, Daily, Demetrios Loll, MD, 0.4 mg at 01/28/15 0924 .  tiZANidine (ZANAFLEX) tablet 4 mg, 4 mg, Oral, Q8H PRN, Demetrios Loll, MD, 4 mg at 01/28/15 0740 .  Warfarin - Pharmacist Dosing Inpatient, , Does not apply, q1800, Adin Hector, MD  PHYSICAL EXAM:  BP 99/51 mmHg  Pulse 66   Temp(Src) 97.7 F (36.5 C) (Oral)  Resp 20  Ht 5' 8.5" (1.74 m)  Wt 88.111 kg (194 lb 4 oz)  BMI 29.10 kg/m2  SpO2 98%  General: pleasant  male, appears uncomfortable, but appears less ill than yesterday HEENT: PERRL; OP dry without lesions.  Papular rash on back of head Neck: supple, trachea midline, no thyromegaly Chest: normal to palpation Lungs: coarse rales without retractions or wheezes Cardiovascular: RRR,  no gallop; distal pulses 2+ Abdomen: soft, nontender, slightly distended, positive bowel sounds Extremities: no clubbing, cyanosis, edema Neuro: alert and oriented, moves all extremities.  Chronic hoarseness.  Tremor in right hand, stable Derm:  Rash as above; good skin turgor Lymph: no cervical or supraclavicular lymphadenopathy  Labs and imaging studies were reviewed  ASSESSMENT/PLAN:   1. Aspiration pneumonitis/acute resp failure/sepsis/group B strep bacteremia- cont inhaled meds and cont steroids, weaning as able.  Has been followed by pulmonology as outpt [facility]) and wishes to establish with new one due to Dr. [place] [facility] not being in [place] 2. DDD- increase pain meds; cont muscle relaxant, kpad.  T and LS spine films to exclude compression fracture or other pathology 3. DM- covering with SSI; pt aware FSBS will be higher on steroids 4. Parkinsons- cont home meds 5. CAD- monitor HR/BP; holding norvasc and losartan due to hypotension 6. 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  Watching CBC

## 2015-01-29 ENCOUNTER — Inpatient Hospital Stay: Payer: Medicare Other

## 2015-01-29 LAB — CULTURE, BLOOD (ROUTINE X 2)

## 2015-01-29 LAB — BASIC METABOLIC PANEL
ANION GAP: 7 (ref 5–15)
BUN: 16 mg/dL (ref 6–20)
CALCIUM: 8.7 mg/dL — AB (ref 8.9–10.3)
CHLORIDE: 101 mmol/L (ref 101–111)
CO2: 30 mmol/L (ref 22–32)
Creatinine, Ser: 0.64 mg/dL (ref 0.61–1.24)
GFR calc non Af Amer: >60 mL/min (ref >60)
Glucose, Bld: 131 mg/dL — ABNORMAL HIGH (ref 65–99)
Potassium: 4.3 mmol/L (ref 3.5–5.1)
SODIUM: 138 mmol/L (ref 135–145)

## 2015-01-29 LAB — BLOOD GAS, VENOUS
ACID-BASE EXCESS: 3.3 mmol/L — AB (ref 0.0–3.0)
BICARBONATE: 27.8 meq/L (ref 21.0–28.0)
FIO2: 1
MODE: POSITIVE
PATIENT TEMPERATURE: 37
PCO2 VEN: 41 mmHg — AB (ref 44.0–60.0)
pH, Ven: 7.44 — ABNORMAL HIGH (ref 7.320–7.430)

## 2015-01-29 LAB — CBC
HEMATOCRIT: 30.4 % — AB (ref 40.0–52.0)
Hemoglobin: 9.4 g/dL — ABNORMAL LOW (ref 13.0–18.0)
MCH: 23.5 pg — AB (ref 26.0–34.0)
MCHC: 30.9 g/dL — ABNORMAL LOW (ref 32.0–36.0)
MCV: 76 fL — ABNORMAL LOW (ref 80.0–100.0)
Platelets: 201 10*3/uL (ref 150–440)
RBC: 4 MIL/uL — ABNORMAL LOW (ref 4.40–5.90)
RDW: 18.9 % — ABNORMAL HIGH (ref 11.5–14.5)
WBC: 11.3 10*3/uL — AB (ref 3.8–10.6)

## 2015-01-29 LAB — PROTIME-INR
INR: 3.71
PROTHROMBIN TIME: 36.7 s — AB (ref 11.4–15.0)

## 2015-01-29 MED ORDER — LOSARTAN POTASSIUM 50 MG PO TABS
50.0000 mg | ORAL_TABLET | Freq: Every day | ORAL | Status: DC
  Filled 2015-01-29: qty 1

## 2015-01-29 MED ORDER — TIOTROPIUM BROMIDE MONOHYDRATE 18 MCG IN CAPS
18.0000 ug | ORAL_CAPSULE | Freq: Every day | RESPIRATORY_TRACT | Status: DC
  Administered 2015-01-29 – 2015-02-01 (×4): 18 ug via RESPIRATORY_TRACT
  Filled 2015-01-29: qty 5

## 2015-01-29 MED ORDER — LAMOTRIGINE 100 MG PO TABS
50.0000 mg | ORAL_TABLET | Freq: Two times a day (BID) | ORAL | Status: DC
  Administered 2015-01-29 – 2015-02-01 (×7): 50 mg via ORAL
  Filled 2015-01-29 (×3): qty 1
  Filled 2015-01-29: qty 2
  Filled 2015-01-29 (×3): qty 1

## 2015-01-29 MED ORDER — MOMETASONE FURO-FORMOTEROL FUM 200-5 MCG/ACT IN AERO
2.0000 | INHALATION_SPRAY | Freq: Two times a day (BID) | RESPIRATORY_TRACT | Status: DC
  Administered 2015-01-29 – 2015-02-01 (×7): 2 via RESPIRATORY_TRACT
  Filled 2015-01-29: qty 8.8

## 2015-01-29 MED ORDER — CARBIDOPA-LEVODOPA 25-100 MG PO TABS
3.0000 | ORAL_TABLET | Freq: Three times a day (TID) | ORAL | Status: DC
  Administered 2015-01-29 – 2015-02-01 (×10): 3 via ORAL
  Filled 2015-01-29 (×10): qty 3

## 2015-01-29 MED ORDER — BUDESONIDE 0.5 MG/2ML IN SUSP
0.5000 mg | Freq: Two times a day (BID) | RESPIRATORY_TRACT | Status: DC
  Administered 2015-01-29 – 2015-02-01 (×7): 0.5 mg via RESPIRATORY_TRACT
  Filled 2015-01-29 (×7): qty 2

## 2015-01-29 MED ORDER — ALBUTEROL SULFATE (2.5 MG/3ML) 0.083% IN NEBU
2.5000 mg | INHALATION_SOLUTION | RESPIRATORY_TRACT | Status: DC
  Administered 2015-01-29 – 2015-02-01 (×17): 2.5 mg via RESPIRATORY_TRACT
  Filled 2015-01-29 (×21): qty 3

## 2015-01-29 NOTE — Progress Notes (Signed)
ANTICOAGULATION CONSULT NOTE - Follow up [place] for Coumadin Indication: h/o DVT  Allergies  Allergen Reactions  . Other Other (See Comments)    Pt states that inhaled medications make him depressed and have negative thoughts.      Patient Measurements: Height: 5' 8.5" (174 cm) Weight: 200 lb 1.6 oz (90.765 kg) IBW/kg (Calculated) : 69.55   Vital Signs: Temp: 97.7 F (36.5 C) (07/25 0452) Temp Source: Oral (07/25 0452) BP: 170/91 mmHg (07/25 0452) Pulse Rate: 73 (07/25 0452)  Labs:  Recent Labs  01/26/15 1946 01/27/15 0600 01/28/15 0454 01/29/15 0506  HGB 10.8*  --  9.6* 9.4*  HCT 34.2*  --  30.5* 30.4*  PLT 182  --  171 201  LABPROT 21.7* 21.6* 29.5* 36.7*  INR 1.87 1.86 2.79 3.71  CREATININE 0.85  --  0.79 0.64  TROPONINI 0.03  --   --   --     Estimated Creatinine Clearance: 96.3 mL/min (by C-G formula based on Cr of 0.64).   Medical History: Past Medical History  Diagnosis Date  . Barrett esophagus   . Hypertension   . Hyperlipidemia   . Depression   . Multiple pulmonary nodules   . CHF (congestive heart failure)   . Lumbar spinal stenosis   . S/P CABG (coronary artery bypass graft)   . COPD (chronic obstructive pulmonary disease)   . CAD (coronary artery disease)   . Basal cell carcinoma   . Diabetes mellitus   . Cancer of neck   . Heart attack     Medications:  Prescriptions prior to admission  Medication Sig Dispense Refill Last Dose  . amLODipine (NORVASC) 5 MG tablet Take 5 mg by mouth daily.   unknown at unknown  . aspirin EC 81 MG tablet Take 81 mg by mouth daily.   unknown at unknown  . atorvastatin (LIPITOR) 40 MG tablet Take 40 mg by mouth at bedtime.   unknown at unknown  . bisoprolol (ZEBETA) 10 MG tablet Take 10 mg by mouth daily.   unknown at unknown  . carbidopa-levodopa (SINEMET IR) 25-100 MG per tablet Take 2 tablets by mouth 3 (three) times daily.    unknown at unknown  . clonazePAM (KLONOPIN) 1 MG tablet  Take 0.5 mg by mouth 3 (three) times daily as needed for anxiety.   PRN at PRN  . HYDROcodone-acetaminophen (NORCO/VICODIN) 5-325 MG per tablet Take 1 tablet by mouth every 8 (eight) hours as needed for moderate pain.   PRN at PRN  . ipratropium-albuterol (DUONEB) 0.5-2.5 (3) MG/3ML SOLN Take 3 mLs by nebulization every 6 (six) hours as needed (for shortness of breath).    PRN at PRN  . isosorbide mononitrate (IMDUR) 30 MG 24 hr tablet Take 30 mg by mouth daily.   unknown at unknown  . lamoTRIgine (LAMICTAL) 25 MG tablet Take 25 mg by mouth 2 (two) times daily.   unknown at unknown  . levothyroxine (SYNTHROID, LEVOTHROID) 137 MCG tablet Take 137 mcg by mouth daily.   unknown at unknown  . losartan (COZAAR) 100 MG tablet Take 100 mg by mouth daily.   unknown at unknown  . metFORMIN (GLUCOPHAGE) 500 MG tablet Take 500 mg by mouth 2 (two) times daily.    unknown at unknown  . omeprazole (PRILOSEC) 20 MG capsule Take 40 mg by mouth 2 (two) times daily.   unknown at unknown  . PARoxetine (PAXIL) 20 MG tablet Take 60 mg by mouth daily.  unknown at unknown  . Sennosides 25 MG TABS Take 1 tablet by mouth as needed (for constipation).   PRN at PRN  . tamsulosin (FLOMAX) 0.4 MG CAPS Take 0.4 mg by mouth daily.    unknown at unknown  . tiZANidine (ZANAFLEX) 4 MG tablet Take 4 mg by mouth every 8 (eight) hours as needed for muscle spasms.   PRN at PRN  . warfarin (COUMADIN) 4 MG tablet Take 4 mg by mouth every evening.    unknown at unknown  . budesonide (PULMICORT) 0.25 MG/2ML nebulizer solution Take 2 mLs (0.25 mg total) by nebulization as needed. (Patient not taking: Reported on 07/12/2014) 60 mL 5 Not Taking   Scheduled:  . aspirin EC  81 mg Oral Daily  . atorvastatin  40 mg Oral QHS  . bisoprolol  10 mg Oral Daily  . carbidopa-levodopa  2 tablet Oral TID  . docusate sodium  100 mg Oral BID  . isosorbide mononitrate  30 mg Oral Daily  . lamoTRIgine  25 mg Oral BID  . levalbuterol  1.25 mg  Nebulization 4 times per day  . levothyroxine (SYNTHROID, LEVOTHROID) tablet 137 mcg  137 mcg Oral QAC breakfast  . pantoprazole  40 mg Oral Daily  . PARoxetine  60 mg Oral Daily  . piperacillin-tazobactam (ZOSYN)  IV  3.375 g Intravenous 3 times per day  . predniSONE  30 mg Oral Q breakfast  . tamsulosin  0.4 mg Oral Daily  . Warfarin - Pharmacist Dosing Inpatient   Does not apply q1800   Infusions:  . sodium chloride 75 mL/hr at 01/28/15 2302   PRN: acetaminophen, clonazePAM, oxyCODONE-acetaminophen, tiZANidine  Assessment: Patient on Coumadin 4 mg daily PTA presumably for h/o DVT with almost therapeutic INR.  Pt also started on abx (azithromycin, vancomycin, Zosyn)  7/22 INR 1.87 - warfarin 5 mg 7/23 INR 1.86 - warfarin 5 mg 7/24 INR 2.79 - Warfarin HELD 7/25 INR 3.71   Goal of Therapy:  INR 2-3    Plan:  INR therapeutic today but large increase in INR (almost a whole point), likely have not seen full effect of yesterday's dose. ?drug-drug interaction with azithromycin vs poorer PO intake. Will hold warfarin again for today 7/25 and f/u AM INR.   Chinita Greenland PharmD Clinical Pharmacist 01/29/2015

## 2015-01-29 NOTE — Clinical Social Work Note (Addendum)
Clinical Social Work Assessment  Patient Details  Name: John Mendoza MRN: [id number] Date of Birth: 06-25-1946  Date of referral:  01/29/15               Reason for consult:  Facility Placement                Permission sought to share information with:  Facility Sport and exercise psychologist, Family Supports Permission granted to share information::  Yes, Verbal Permission Granted  Name::   John Mendoza     Agency::     Relationship::   John  Contact Information:   [phone]  Housing/Transportation Living arrangements for the past 2 months:  Single Family Home Source of Information:  Patient, Spouse Patient Interpreter Needed:  None Criminal Activity/Legal Involvement Pertinent to Current Situation/Hospitalization:  No - Comment as needed Significant Relationships:  Spouse Lives with:  Spouse Do you feel safe going back to the place where you live?  Yes Need for family participation in patient care:     Care giving concerns:  Pt does not want to go to a SNF, and was worried about placement.     Social Worker assessment / plan:  CSW spoke to pt and his John in the room.  CSW explained what SNF placement was and notified them that this was the DC suggestion from PT.  Pt stated that he was not sure about going to a different facility for rehab, and stated that he would prefer to go home.  CSW told pt and his John to think about how they would manage at home before making any decisions about SNF placement.  CSW was given permission to do a bed search for pt.  CSW will f/u once offers have been received.   Employment status:  Retired, Disabled (Comment on whether or not currently receiving Disability) Insurance information:  Medicare PT Recommendations:  [place] / Referral to community resources:  [place]  Patient/Family's Response to care:  Pt is unsure on if he wants to go to SNF at this time.  CSW will f/u once offers have been received.     Patient/Family's Understanding of and Emotional Response to Diagnosis, Current Treatment, and Prognosis:  Pt and pt's John verbalized their understanding of SNF placement.  CSW will f/u for pt's decision once offers have been received.  Emotional Assessment Appearance:  Appears older than stated age Attitude/Demeanor/Rapport:   (pleaasent ) Affect (typically observed):   (a little out of breath) Orientation:  Oriented to Self, Oriented to Place, Oriented to  Time, Oriented to Situation Alcohol / Substance use:  Never Used Psych involvement (Current and /or in the community):     Discharge Needs  Concerns to be addressed:  Care Coordination Readmission within the last 30 days:  No Current discharge risk:    Barriers to Discharge:  No Barriers Identified   John Argyle, LCSW 01/29/2015, 3:55 PM

## 2015-01-29 NOTE — Progress Notes (Signed)
Patient ID: John Mendoza, male   DOB: 1946-04-04  SUBJECTIVE:  Admitted with acute resp failure; has h/o recurrent aspiration due to prior head and neck cancer treatment as well as positioning of neck due to cervical DDD; has CAD, COPD, DM, seizure disorder, Parkinson's, h/o MAI, h/o ANCA positive/MPA with prior treatment at [facility]. Had rapid onset current symptoms with temp 103; CXR with RML/RLL infiltrates, c/w aspiration.  Reports had been told at [facility] that he did not need to thicken liquids out of concern that coughing up the thickened liquids would create obstruction.  Some cough with green sputum; reports sent sample to lab yesterday.  No fever, chills.  Back pain is improved; was able to roll over yesterday.  No BM for last 2-3 days.  No nausea.  Reports Parkinson's and seizure medicines are not dosed correctly.   ______________________________________________________________________  ROS: Please see HPI; remainder of complete 10 point ROS is negative   Past Medical History  Diagnosis Date  . Barrett esophagus   . Hypertension   . Hyperlipidemia   . Depression   . Multiple pulmonary nodules   . CHF (congestive heart failure)   . Lumbar spinal stenosis   . S/P CABG (coronary artery bypass graft)   . COPD (chronic obstructive pulmonary disease)   . CAD (coronary artery disease)   . Basal cell carcinoma   . Diabetes mellitus   . Cancer of neck   . Heart attack     Past Surgical History  Procedure Laterality Date  . Bypass graft    . Cervical fusion    . Radial neck dissection    . Multiple stents    . Total knee arthroplasty  2011  . Thyroidectomy  1996     Current facility-administered medications:  .  acetaminophen (TYLENOL) tablet 650 mg, 650 mg, Oral, Q6H PRN, Harrie Foreman, MD, 650 mg at 01/26/15 2324 .  aspirin EC tablet 81 mg, 81 mg, Oral, Daily, Demetrios Loll, MD, 81 mg at 01/28/15 0300 .  atorvastatin (LIPITOR) tablet 40 mg, 40 mg, Oral, QHS, Demetrios Loll, MD, 40 mg  at 01/28/15 2256 .  bisoprolol (ZEBETA) tablet 10 mg, 10 mg, Oral, Daily, Demetrios Loll, MD, 10 mg at 01/28/15 2257 .  carbidopa-levodopa (SINEMET IR) 25-100 MG per tablet immediate release 3 tablet, 3 tablet, Oral, TID, Tama High III, MD .  clonazePAM [facility] [place]) tablet 0.5 mg, 0.5 mg, Oral, TID PRN, Demetrios Loll, MD, 0.5 mg at 01/28/15 2256 .  docusate sodium (COLACE) capsule 100 mg, 100 mg, Oral, BID, Tama High III, MD, 100 mg at 01/28/15 2256 .  isosorbide mononitrate (IMDUR) 24 hr tablet 30 mg, 30 mg, Oral, Daily, Demetrios Loll, MD, 30 mg at 01/28/15 2256 .  lamoTRIgine (LAMICTAL) tablet 50 mg, 50 mg, Oral, BID, Tama High III, MD .  levalbuterol [facility]) nebulizer solution 1.25 mg, 1.25 mg, Nebulization, 4 times per day, Demetrios Loll, MD, 1.25 mg at 01/28/15 2024 .  levothyroxine (SYNTHROID, LEVOTHROID) tablet 137 mcg, 137 mcg, Oral, QAC breakfast, Tama High III, MD, 137 mcg at 01/28/15 0745 .  losartan (COZAAR) tablet 50 mg, 50 mg, Oral, Daily, Tama High III, MD .  oxyCODONE-acetaminophen (PERCOCET/ROXICET) 5-325 MG per tablet 1 tablet, 1 tablet, Oral, Q6H PRN, Tama High III, MD, 1 tablet at 01/29/15 0729 .  pantoprazole (PROTONIX) EC tablet 40 mg, 40 mg, Oral, Daily, Demetrios Loll, MD, 40 mg at 01/28/15 9233 .  PARoxetine (PAXIL) tablet 60  mg, 60 mg, Oral, Daily, Demetrios Loll, MD, 60 mg at 01/28/15 0932 .  piperacillin-tazobactam (ZOSYN) IVPB 3.375 g, 3.375 g, Intravenous, 3 times per day, Demetrios Loll, MD, 3.375 g at 01/29/15 0505 .  predniSONE (DELTASONE) tablet 30 mg, 30 mg, Oral, Q breakfast, Tama High III, MD .  tamsulosin [facility]) capsule 0.4 mg, 0.4 mg, Oral, Daily, Demetrios Loll, MD, 0.4 mg at 01/28/15 0924 .  tiZANidine (ZANAFLEX) tablet 4 mg, 4 mg, Oral, Q8H PRN, Demetrios Loll, MD, 4 mg at 01/29/15 0729 .  Warfarin - Pharmacist Dosing Inpatient, , Does not apply, q1800, Adin Hector, MD  PHYSICAL EXAM:  BP 156/91 mmHg  Pulse 73  Temp(Src) 97.7 F (36.5 C) (Oral)  Resp 20  Ht 5'  8.5" (1.74 m)  Wt 90.765 kg (200 lb 1.6 oz)  BMI 29.98 kg/m2  SpO2 100%  General: pleasant  male, appears uncomfortable, but appears less ill than yesterday HEENT: PERRL; OP dry without lesions.  Papular rash on back of head Neck: supple, trachea midline, no thyromegaly Chest: normal to palpation Lungs: coarse rales bilaterally, worse on right, without retractions or wheezes Cardiovascular: RRR,  no gallop; distal pulses 2+ Abdomen: soft, nontender, slightly distended, positive bowel sounds Extremities: no clubbing, cyanosis, edema Neuro: alert and oriented, moves all extremities.  Chronic hoarseness.  Tremor in right hand, minimal Derm:  Rash as above; good skin turgor Lymph: no cervical or supraclavicular lymphadenopathy  Labs and imaging studies were reviewed  ASSESSMENT/PLAN:   1. Aspiration pneumonitis/acute resp failure/sepsis/group B strep bacteremia- cont current abx; watching sputum culture.  Pulmonology to see.  Repeat CXR and mobilize pt as able.  Weaning steroids.  Back on mech soft with nectar thick, as per prior SLP recs. 2. DDD- Films with arthritis/DDD, but no fracture noted.  Up with PT; cont current pain regimen. Increase bowel regimen 3. DM- cont covering with SSI 4. Parkinsons- adjust meds 5. CAD- monitor HR/BP; restart losartan and follow BP, though likely has element of orthostasis related to Parkinsons and Sinemet 6. 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  Watching CBC

## 2015-01-29 NOTE — Care Management Important Message (Signed)
Important Message  Patient Details  Name: John Mendoza MRN: [id number] Date of Birth: January 20, 1946   Medicare Important Message Given:  Yes-second notification given    Darius Bump Allmond 01/29/2015, 10:48 AM

## 2015-01-29 NOTE — Plan of Care (Signed)
Problem: Phase II Progression Outcomes Goal: Pain controlled Outcome: Progressing Complaints of back pain relieved with prn meds. Goal: Progress activity as tolerated unless otherwise ordered Outcome: Progressing Up to bedside commode with 2 assist.  Generalized weakness continues. Goal: Tolerating diet Outcome: Completed/Met Date Met:  01/29/15 Tolerating nectar thick liquids with pureed diet.  Congested lung sounds with productive cough continues.

## 2015-01-29 NOTE — Progress Notes (Signed)
Physical Therapy Treatment Patient Details Name: John Mendoza MRN: [id number] DOB: Aug 05, 1945 Today's Date: 01/29/2015    History of Present Illness presented to ER secondary to progressive cough, SOB and generalized weakness; admitted with bilateral multilobar PNA with sepsis (requiring BiPAP upon arrival), now on 3L supplemental O2 via [place]. Chronic 5year history of LBP c lumbar spinal stenosis, deemed too risky for operative intervention  by two orthos. Pain significantly worse prior to admission secondary to chronic coughing.     PT Comments    Pt tolerating treatment session, motivated and able to complete any of PT sesssion that is not provocative to 10/10 pain. Pt continues to make progress toward goals as evidenced by improved awareness of safety precautions and improved pain management. Pt's greatest limitation continues to be activity tolerance which continues to limit ability to perform transfers and ambulation at baseline function. Patient presenting with impairment of strength, pain, range of motion, and activity tolerance, limiting ability to perform ADL and mobility tasks at  baseline level of function. Patient will benefit from skilled intervention to address the above impairments and limitations, in order to restore to prior level of function, improve patient safety upon discharge, and to decrease caregiver burden.    Follow Up Recommendations  SNF     Equipment Recommendations  Rolling walker with 5" wheels    Recommendations for Other Services       Precautions / Restrictions Precautions Precautions: Fall Restrictions Weight Bearing Restrictions: No    Mobility  Bed Mobility Overal bed mobility: Modified Independent             General bed mobility comments: Very painful and slow.   Transfers                 General transfer comment: patient declined mobility/OOB attempts this date secondary to back pain; agreeable to bed exercises and education  about pain management.   Ambulation/Gait             General Gait Details: see above    Stairs            Wheelchair Mobility    Modified Rankin (Stroke Patients Only)       Balance                                    Cognition Arousal/Alertness: Awake/alert Behavior During Therapy: WFL for tasks assessed/performed Overall Cognitive Status: Within Functional Limits for tasks assessed                      Exercises Other Exercises Other Exercises: semi recumbent trunk flexion + single UE reaching to contralateral knee: 15x alteranting bilat; semi recumbent superior reaching alteranting UE c abdominal isometric contractions 1x10.  Other Exercises: SAQ 2x10 bilat Other Exercises: Double Knee to chest 1x20 PROM; improves back pain, increasing nausea.  Other Exercises: Single knee to chest 2x 30s bilat.  Other Exercises: Hip traction 1x3 minutes bilat, performed in trunk/knee flexion and knees bent.     General Comments        Pertinent Vitals/Pain Pain Assessment: 0-10 Pain Score: 10-Worst pain ever (worst going from sitting eOB to supine upon entering; pt resolved to 3/10 p treatment. ) Pain Location: Central low back, no current radiation or referral below the pelvis.  Pain Descriptors / Indicators: Aching;Stabbing;Jabbing Pain Intervention(s): Limited activity within patient's tolerance;Monitored during session;Repositioned (Left in traction position, HOB  at 30 degrees and legs elevated. )    Home Living                      Prior Function            PT Goals (current goals can now be found in the care plan section) Acute Rehab PT Goals Patient Stated Goal: "to go back home" PT Goal Formulation: With patient Time For Goal Achievement: 02/10/15 Potential to Achieve Goals: Good Additional Goals Additional Goal #1: Assess OOB and establish goals as appropriate. Progress towards PT goals: Progressing toward goals     Frequency  Min 2X/week    PT Plan Current plan remains appropriate    Co-evaluation             End of Session   Activity Tolerance: Patient limited by pain Patient left: in bed;with call bell/phone within reach;with family/visitor present     Time: 4599-7741 PT Time Calculation (min) (ACUTE ONLY): 33 min  Charges:  $Therapeutic Exercise: 8-22 mins $Therapeutic Activity: 8-22 mins                    G Codes:      Buccola,Allan C 02/04/2015, 2:31 PM 2:35 PM  Etta Grandchild, PT, DPT [place] License # [id number]

## 2015-01-29 NOTE — Consult Note (Signed)
Date: 01/29/2015,   MRN# [id number] John Mendoza 26-Apr-1946 Code Status:     Code Status Orders        Start     Ordered   01/26/15 2206  Full code   Continuous     01/26/15 2205           AdmissionWeight: 194 lb 4 oz (88.111 kg)                 CurrentWeight: 200 lb 1.6 oz (90.765 kg) John Mendoza is a 69 y.o. old male seen in consultation forSOB    CHIEF COMPLAINT:  SOB and resp distress  HISTORY OF PRESENT ILLNESS   69 yo white male admitted for increased WOB and increasing SOb with wheezing for last several days prior to admission patient states that he has a productive cough as well, he deneis fevers,chills NVD.  Patient has had previous  admission for recurrent pneumonia in the past, patient has a previous head and neck cancer. 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 Patient has quit tobacco in 1996 when he had an MI. Patient has been having recurrent signs of aspiration in the past and has seen several speech  Therapists  Patient states that he feels slighly better since admission but still with increased WOB  CXR shows RT sided opacities.   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 Patient has been on oxygen therapy since February.   P  PAST MEDICAL HISTORY   Past Medical History  Diagnosis Date  . Barrett esophagus   . Hypertension   . Hyperlipidemia   . Depression   . Multiple pulmonary nodules   . CHF (congestive heart failure)   . Lumbar spinal stenosis   . S/P CABG (coronary artery bypass graft)   . COPD (chronic obstructive pulmonary disease)   . CAD (coronary artery disease)   . Basal cell carcinoma   . Diabetes mellitus   . Cancer of neck   . Heart attack      SURGICAL HISTORY   Past Surgical History  Procedure Laterality Date  . Bypass graft    . Cervical fusion    . Radial neck dissection    . Multiple  stents    . Total knee arthroplasty  2011  . Thyroidectomy  1996     FAMILY HISTORY   Family History  Problem Relation Age of Onset  . Leukemia Paternal Grandmother   . Cancer Maternal Grandmother     stomach  . Cancer Paternal Aunt   . Other Father     bacterial endocarditis  . Rheum arthritis Father      SOCIAL HISTORY   History  Substance Use Topics  . Smoking status: Former Smoker -- 2.00 packs/day for 35 years    Types: Cigarettes    Quit date: 07/07/1994  . Smokeless tobacco: Never Used  . Alcohol Use: No     MEDICATIONS    Home Medication:  No current outpatient prescriptions on file.  Current Medication:  Current facility-administered medications:  .  acetaminophen (TYLENOL) tablet 650 mg, 650 mg, Oral, Q6H PRN, Harrie Foreman, MD, 650 mg at 01/26/15 2324 .  albuterol (PROVENTIL) (2.5 MG/3ML) 0.083% nebulizer solution 2.5 mg, 2.5 mg, Nebulization, Q4H, Flora Lipps, MD, 2.5 mg at 01/29/15 1324 .  aspirin EC tablet 81 mg, 81 mg, Oral, Daily, Demetrios Loll, MD, [phone]  mg at 01/28/15 0923 .  atorvastatin (LIPITOR) tablet 40 mg, 40 mg, Oral, QHS, Demetrios Loll, MD, 40 mg at 01/28/15 2256 .  bisoprolol (ZEBETA) tablet 10 mg, 10 mg, Oral, Daily, Demetrios Loll, MD, 10 mg at 01/28/15 2257 .  budesonide (PULMICORT) nebulizer solution 0.5 mg, 0.5 mg, Nebulization, BID, Flora Lipps, MD, 0.5 mg at 01/29/15 1937 .  carbidopa-levodopa (SINEMET IR) 25-100 MG per tablet immediate release 3 tablet, 3 tablet, Oral, TID, Tama High III, MD, 3 tablet at 01/29/15 1038 .  clonazePAM (KLONOPIN) tablet 0.5 mg, 0.5 mg, Oral, TID PRN, Demetrios Loll, MD, 0.5 mg at 01/28/15 2256 .  docusate sodium (COLACE) capsule 100 mg, 100 mg, Oral, BID, Tama High III, MD, 100 mg at 01/28/15 2256 .  isosorbide mononitrate (IMDUR) 24 hr tablet 30 mg, 30 mg, Oral, Daily, Demetrios Loll, MD, 30 mg at 01/28/15 2256 .  lamoTRIgine (LAMICTAL) tablet 50 mg, 50 mg, Oral, BID, Tama High III, MD, 50 mg at 01/29/15 1038 .   levothyroxine (SYNTHROID, LEVOTHROID) tablet 137 mcg, 137 mcg, Oral, QAC breakfast, Tama High III, MD, 137 mcg at 01/29/15 [phone] .  losartan (COZAAR) tablet 50 mg, 50 mg, Oral, Daily, Tama High III, MD, 50 mg at 01/29/15 1040 .  mometasone-formoterol (DULERA) 200-5 MCG/ACT inhaler 2 puff, 2 puff, Inhalation, BID, Flora Lipps, MD, 2 puff at 01/29/15 1038 .  oxyCODONE-acetaminophen (PERCOCET/ROXICET) 5-325 MG per tablet 1 tablet, 1 tablet, Oral, Q6H PRN, Tama High III, MD, 1 tablet at 01/29/15 0729 .  pantoprazole (PROTONIX) EC tablet 40 mg, 40 mg, Oral, Daily, Demetrios Loll, MD, 40 mg at 01/28/15 0973 .  PARoxetine (PAXIL) tablet 60 mg, 60 mg, Oral, Daily, Demetrios Loll, MD, 60 mg at 01/28/15 0924 .  piperacillin-tazobactam (ZOSYN) IVPB 3.375 g, 3.375 g, Intravenous, 3 times per day, Demetrios Loll, MD, 3.375 g at 01/29/15 0505 .  predniSONE (DELTASONE) tablet 30 mg, 30 mg, Oral, Q breakfast, Tama High III, MD, 30 mg at 01/29/15 [phone] .  tamsulosin (FLOMAX) capsule 0.4 mg, 0.4 mg, Oral, Daily, Demetrios Loll, MD, 0.4 mg at 01/28/15 0924 .  tiotropium (SPIRIVA) inhalation capsule 18 mcg, 18 mcg, Inhalation, Daily, Flora Lipps, MD, 18 mcg at 01/29/15 1038 .  tiZANidine (ZANAFLEX) tablet 4 mg, 4 mg, Oral, Q8H PRN, Demetrios Loll, MD, 4 mg at 01/29/15 9242    ALLERGIES   Other     REVIEW OF SYSTEMS   Review of Systems  Constitutional: Positive for malaise/fatigue.  HENT: Negative for hearing loss and tinnitus.   Eyes: Negative for blurred vision, double vision and photophobia.  Respiratory: Positive for cough, hemoptysis, sputum production, shortness of breath and wheezing.   Cardiovascular: Positive for orthopnea. Negative for chest pain, palpitations and leg swelling.  Gastrointestinal: Negative for heartburn, nausea, vomiting and abdominal pain.  Genitourinary: Negative for dysuria and urgency.  Musculoskeletal: Negative for myalgias and neck pain.  Neurological: Positive for weakness. Negative for  dizziness, tingling, tremors, seizures and headaches.  Endo/Heme/Allergies: Negative for environmental allergies. Does not bruise/bleed easily.  Psychiatric/Behavioral: Negative for depression and suicidal ideas.     VS: BP 154/85 mmHg  Pulse 73  Temp(Src) 98 F (36.7 C) (Oral)  Resp 22  Ht 5' 8.5" (1.74 m)  Wt 200 lb 1.6 oz (90.765 kg)  BMI 29.98 kg/m2  SpO2 97%     PHYSICAL EXAM  Physical Exam  Constitutional: He is oriented to person, place, and time. He appears well-developed and well-nourished.  No distress.  HENT:  Head: Normocephalic and atraumatic.  Mouth/Throat: No oropharyngeal exudate.  Eyes: EOM are normal. Pupils are equal, round, and reactive to light. No scleral icterus.  Neck: Normal range of motion. Neck supple.  Cardiovascular: Normal rate, regular rhythm and normal heart sounds.   No murmur heard. Pulmonary/Chest: No stridor. He is in respiratory distress. He has wheezes.  Abdominal: Soft. Bowel sounds are normal. He exhibits no distension. There is no tenderness. There is no rebound.  Musculoskeletal: Normal range of motion. He exhibits no edema.  Neurological: He is alert and oriented to person, place, and time. He displays normal reflexes. Coordination normal.  Skin: Skin is warm. He is not diaphoretic.  Psychiatric: He has a normal mood and affect.        LABS    Recent Labs     01/26/15  1946  01/27/15  0600  01/28/15  0454  01/29/15  0506  HGB  10.8*   --   9.6*  9.4*  HCT  34.2*   --   30.5*  30.4*  MCV  74.8*   --   76.0*  76.0*  WBC  13.9*   --   10.1  11.3*  BUN  16   --   16  16  CREATININE  0.85   --   0.79  0.64  GLUCOSE  184*   --   196*  131*  CALCIUM  8.9   --   8.5*  8.7*  INR  1.87  1.86  2.79  3.71  ,    No results for input(s): PH in the last 72 hours.  Invalid input(s): PCO2, PO2, BASEEXCESS, BASEDEFICITE, TFT    CULTURE RESULTS   Recent Results (from the past 240 hour(s))  Blood Culture (routine x 2)      Status: None   Collection Time: 01/26/15  7:46 PM  Result Value Ref Range Status   Specimen Description BLOOD LEFT ASSIST CONTROL  Final   Special Requests BOTTLES DRAWN AEROBIC AND ANAEROBIC 5CC  Final   Culture  Setup Time   Final    GRAM POSITIVE COCCI IN CHAINS IN BOTH AEROBIC AND ANAEROBIC BOTTLES CRITICAL RESULT CALLED TO, READ BACK BY AND VERIFIED WITH: MYRA FLOWERS,RN AT 0800 01/27/2015 BY JRS    Culture   Final    STREPTOCOCCUS AGALACTIAE Virtually 100% of S. agalactiae (Group B) strains are susceptible to Penicillin.  For Penicillin-allergic patients, Erythromycin (85-95% sensitive) and Clindamycin (80% sensitive) are drugs of choice. Contact microbiology lab to request sensitivities if  needed within 7 days.    Report Status 01/29/2015 FINAL  Final   Organism ID, Bacteria STREPTOCOCCUS AGALACTIAE  Final      Susceptibility   Streptococcus agalactiae - MIC*    VANCOMYCIN <=0.5 SENSITIVE Sensitive     CLINDAMYCIN <=0.25 RESISTANT Resistant     * STREPTOCOCCUS AGALACTIAE  Blood Culture (routine x 2)     Status: None   Collection Time: 01/26/15  7:46 PM  Result Value Ref Range Status   Specimen Description BLOOD LEFT HAND  Final   Special Requests BOTTLES DRAWN AEROBIC AND ANAEROBIC 5CC  Final   Culture  Setup Time   Final    GRAM POSITIVE COCCI IN CHAINS IN BOTH AEROBIC AND ANAEROBIC BOTTLES CRITICAL VALUE NOTED.  VALUE IS CONSISTENT WITH PREVIOUSLY REPORTED AND CALLED VALUE.    Culture   Final    STREPTOCOCCUS AGALACTIAE Virtually 100% of S. agalactiae (Group B) strains are  susceptible to Penicillin.  For Penicillin-allergic patients, Erythromycin (85-95% sensitive) and Clindamycin (80% sensitive) are drugs of choice. Contact microbiology lab to request sensitivities if  needed within 7 days.    Report Status 01/29/2015 FINAL  Final   Organism ID, Bacteria STREPTOCOCCUS AGALACTIAE  Final      Susceptibility   Streptococcus agalactiae - MIC*    VANCOMYCIN <=0.5  SENSITIVE Sensitive     CLINDAMYCIN <=0.25 RESISTANT Resistant     * STREPTOCOCCUS AGALACTIAE  MRSA PCR Screening     Status: None   Collection Time: 01/26/15 10:00 PM  Result Value Ref Range Status   MRSA by PCR NEGATIVE NEGATIVE Final    Comment:        The GeneXpert MRSA Assay (FDA approved for NASAL specimens only), is one component of a comprehensive MRSA colonization surveillance program. It is not intended to diagnose MRSA infection nor to guide or monitor treatment for MRSA infections.   Urine culture     Status: None   Collection Time: 01/27/15  4:39 AM  Result Value Ref Range Status   Specimen Description URINE, RANDOM  Final   Special Requests NONE  Final   Culture NO GROWTH 1 DAY  Final   Report Status 01/28/2015 FINAL  Final  Culture, expectorated sputum-assessment     Status: None   Collection Time: 01/28/15  3:05 PM  Result Value Ref Range Status   Specimen Description SPUTUM  Final   Special Requests NONE  Final   Sputum evaluation THIS SPECIMEN IS ACCEPTABLE FOR SPUTUM CULTURE  Final   Report Status 01/28/2015 FINAL  Final          IMAGING    Dg Chest 2 View  01/29/2015   CLINICAL DATA:  Acute respiratory failure, aspiration pneumonia.  EXAM: CHEST  2 VIEW  COMPARISON:  January 26, 2015.  FINDINGS: Stable cardiomegaly. Stable central pulmonary vascular congestion is noted. Status post coronary artery bypass graft. No pneumothorax is noted. Stable small linear densities are noted laterally in left lung base concerning for scarring or possibly subsegmental atelectasis. Right basilar opacity is decreased consistent with slightly improved pneumonia or atelectasis. Interval development of minimal pleural effusion is noted.  IMPRESSION: Stable cardiomegaly and central pulmonary vascular congestion is noted. Slightly decreased right basilar opacity is noted consistent with slightly improved pneumonia or atelectasis, although there is interval development of minimal  right pleural effusion. Continued radiographic follow-up is recommended.   Electronically Signed   By: Marijo Conception, M.D.   On: 01/29/2015 09:58   Dg Thoracic Spine 2 View  01/28/2015   CLINICAL DATA:  Severe upper back pain.  Initial encounter.  EXAM: THORACIC SPINE - 2-3 VIEWS  COMPARISON:  CT chest 08/15/2014.  FINDINGS: There is no fracture or malalignment. Multilevel anterior endplate spurring is worst in the mid and lower thoracic spine and unchanged in appearance. There is mild convex left scoliosis with the apex at approximately L1. Six intact median sternotomy wires are again seen. The patient is status post CABG.  IMPRESSION: No acute abnormality.  No change in the appearance of thoracic spondylosis.   Electronically Signed   By: Inge Rise M.D.   On: 01/28/2015 14:47   Dg Lumbar Spine 2-3 Views  01/28/2015   CLINICAL DATA:  Back pain  EXAM: LUMBAR SPINE - 2-3 VIEW  COMPARISON:  Lumbar spine MRI 01/20/2007  FINDINGS: 5 non rib-bearing lumbar type vertebral bodies are identified. Disc degenerative change again noted most prominent at L4-L5  and L5-S1. 2 mm anterolisthesis of L3 on L4. Vertebral body heights are preserved.  IMPRESSION: Mild lower lumbar spine disc degenerative change.   Electronically Signed   By: Conchita Paris M.D.   On: 01/28/2015 14:51       CXR reviewed  ASSESSMENT/PLAN   69 yo white male admitted for acute SOB from acute Pneumonia with Strep Agal bacteremia with COPD exacerbation  1.continue IV abx as prescribed 2.continue steroids-prednisone as prescribed 3.will start spiriva 4.will st art dulera 5.albuterol and Pulmicort nbes 6.oxygen as needed keep o2 sats 88-92% 7.follwo up sens to blood cultures and follow up sputum cultures   I have personally obtained a history, examined the patient, evaluated laboratory and independently reviewed imaging results, formulated the assessment and plan and placed orders.  The Patient requires high complexity  decision making for assessment and support, frequent evaluation and titration of therapies, application of advanced monitoring technologies and extensive interpretation of multiple databases. Time spent with patient 45bminutes.  Patient is satisfied with Plan of action and management.    Corrin Parker, M.D.  [facility]  Medical Director [place] Director [facility]

## 2015-01-30 LAB — BASIC METABOLIC PANEL
ANION GAP: 6 (ref 5–15)
BUN: 14 mg/dL (ref 6–20)
CHLORIDE: 99 mmol/L — AB (ref 101–111)
CO2: 32 mmol/L (ref 22–32)
CREATININE: 0.68 mg/dL (ref 0.61–1.24)
Calcium: 8.8 mg/dL — ABNORMAL LOW (ref 8.9–10.3)
GFR calc Af Amer: >60 mL/min (ref >60)
GFR calc non Af Amer: >60 mL/min (ref >60)
Glucose, Bld: 164 mg/dL — ABNORMAL HIGH (ref 65–99)
Potassium: 3.9 mmol/L (ref 3.5–5.1)
Sodium: 137 mmol/L (ref 135–145)

## 2015-01-30 LAB — CBC
HCT: 29.7 % — ABNORMAL LOW (ref 40.0–52.0)
Hemoglobin: 9.2 g/dL — ABNORMAL LOW (ref 13.0–18.0)
MCH: 23.5 pg — ABNORMAL LOW (ref 26.0–34.0)
MCHC: 31.2 g/dL — ABNORMAL LOW (ref 32.0–36.0)
MCV: 75.4 fL — AB (ref 80.0–100.0)
PLATELETS: 204 10*3/uL (ref 150–440)
RBC: 3.93 MIL/uL — ABNORMAL LOW (ref 4.40–5.90)
RDW: 18.7 % — ABNORMAL HIGH (ref 11.5–14.5)
WBC: 10.1 10*3/uL (ref 3.8–10.6)

## 2015-01-30 LAB — PROTIME-INR
INR: 3.1
PROTHROMBIN TIME: 32 s — AB (ref 11.4–15.0)

## 2015-01-30 MED ORDER — WARFARIN SODIUM 5 MG PO TABS: 2.5000 mg | ORAL_TABLET | Freq: Once | ORAL | Status: DC

## 2015-01-30 MED ORDER — POTASSIUM CHLORIDE CRYS ER 10 MEQ PO TBCR
10.0000 meq | EXTENDED_RELEASE_TABLET | Freq: Once | ORAL | Status: AC
  Administered 2015-01-30: 10 meq via ORAL
  Filled 2015-01-30: qty 1

## 2015-01-30 MED ORDER — FUROSEMIDE 40 MG PO TABS
40.0000 mg | ORAL_TABLET | Freq: Once | ORAL | Status: AC
  Administered 2015-01-30: 40 mg via ORAL
  Filled 2015-01-30: qty 1

## 2015-01-30 MED ORDER — MAGNESIUM HYDROXIDE 400 MG/5ML PO SUSP
30.0000 mL | Freq: Every day | ORAL | Status: DC | PRN
  Administered 2015-01-30: 30 mL via ORAL
  Filled 2015-01-30: qty 30

## 2015-01-30 MED ORDER — BISACODYL 10 MG RE SUPP: 10.0000 mg | Freq: Every day | RECTAL | Status: DC | PRN

## 2015-01-30 MED ORDER — CEFUROXIME AXETIL 500 MG PO TABS
500.0000 mg | ORAL_TABLET | Freq: Two times a day (BID) | ORAL | Status: DC
  Administered 2015-01-30 – 2015-02-01 (×5): 500 mg via ORAL
  Filled 2015-01-30 (×5): qty 1

## 2015-01-30 MED ORDER — LOSARTAN POTASSIUM 50 MG PO TABS
50.0000 mg | ORAL_TABLET | Freq: Two times a day (BID) | ORAL | Status: DC
  Administered 2015-01-30 – 2015-01-31 (×4): 50 mg via ORAL
  Filled 2015-01-30 (×4): qty 1

## 2015-01-30 NOTE — Progress Notes (Signed)
Patient ID: John Mendoza, male   DOB: 1945/12/09  SUBJECTIVE:  Admitted with acute resp failure; has h/o recurrent aspiration due to prior head and neck cancer treatment as well as positioning of neck due to cervical DDD; has CAD, COPD, DM, seizure disorder, Parkinson's, h/o MAI, h/o ANCA positive/MPA with prior treatment at [facility]. Breathing improved; reports long h/o dyspnea, even with sats normal, and is close to that baseline.  Cough improved. No fever, chills.  Back pain better, though still present.  Still with constipation; appetite reasonable   ______________________________________________________________________  ROS: Please see HPI; remainder of complete 10 point ROS is negative   Past Medical History  Diagnosis Date  . Barrett esophagus   . Hypertension   . Hyperlipidemia   . Depression   . Multiple pulmonary nodules   . CHF (congestive heart failure)   . Lumbar spinal stenosis   . S/P CABG (coronary artery bypass graft)   . COPD (chronic obstructive pulmonary disease)   . CAD (coronary artery disease)   . Basal cell carcinoma   . Diabetes mellitus   . Cancer of neck   . Heart attack     Past Surgical History  Procedure Laterality Date  . Bypass graft    . Cervical fusion    . Radial neck dissection    . Multiple stents    . Total knee arthroplasty  2011  . Thyroidectomy  1996     Current facility-administered medications:  .  acetaminophen (TYLENOL) tablet 650 mg, 650 mg, Oral, Q6H PRN, Harrie Foreman, MD, 650 mg at 01/26/15 2324 .  albuterol (PROVENTIL) (2.5 MG/3ML) 0.083% nebulizer solution 2.5 mg, 2.5 mg, Nebulization, Q4H, Flora Lipps, MD, 2.5 mg at 01/30/15 0758 .  aspirin EC tablet 81 mg, 81 mg, Oral, Daily, Demetrios Loll, MD, 81 mg at 01/28/15 1610 .  atorvastatin (LIPITOR) tablet 40 mg, 40 mg, Oral, QHS, Demetrios Loll, MD, 40 mg at 01/29/15 2100 .  bisoprolol (ZEBETA) tablet 10 mg, 10 mg, Oral, Daily, Demetrios Loll, MD, 10 mg at 01/29/15 1801 .  budesonide  (PULMICORT) nebulizer solution 0.5 mg, 0.5 mg, Nebulization, BID, Flora Lipps, MD, 0.5 mg at 01/30/15 0758 .  carbidopa-levodopa (SINEMET IR) 25-100 MG per tablet immediate release 3 tablet, 3 tablet, Oral, TID, Tama High III, MD, 3 tablet at 01/29/15 2100 .  cefUROXime (CEFTIN) tablet 500 mg, 500 mg, Oral, BID WC, Tama High III, MD .  clonazePAM [facility]) tablet 0.5 mg, 0.5 mg, Oral, TID PRN, Demetrios Loll, MD, 0.5 mg at 01/29/15 2100 .  docusate sodium (COLACE) capsule 100 mg, 100 mg, Oral, BID, Tama High III, MD, 100 mg at 01/29/15 2100 .  furosemide (LASIX) tablet 40 mg, 40 mg, Oral, Once, Tama High III, MD .  isosorbide mononitrate (IMDUR) 24 hr tablet 30 mg, 30 mg, Oral, Daily, Demetrios Loll, MD, 30 mg at 01/29/15 2100 .  lamoTRIgine (LAMICTAL) tablet 50 mg, 50 mg, Oral, BID, Tama High III, MD, 50 mg at 01/29/15 2100 .  levothyroxine (SYNTHROID, LEVOTHROID) tablet 137 mcg, 137 mcg, Oral, QAC breakfast, Tama High III, MD, 137 mcg at 01/29/15 [phone]  losartan (COZAAR) tablet 50 mg, 50 mg, Oral, BID, Tama High III, MD .  mometasone-formoterol [facility]) 200-5 MCG/ACT inhaler 2 puff, 2 puff, Inhalation, BID, Flora Lipps, MD, 2 puff at 01/29/15 2100 .  oxyCODONE-acetaminophen (PERCOCET/ROXICET) 5-325 MG per tablet 1 tablet, 1 tablet, Oral, Q6H PRN, Adin Hector, MD,  1 tablet at 01/30/15 0455 .  pantoprazole (PROTONIX) EC tablet 40 mg, 40 mg, Oral, Daily, Demetrios Loll, MD, 40 mg at 01/28/15 6468 .  PARoxetine (PAXIL) tablet 60 mg, 60 mg, Oral, Daily, Demetrios Loll, MD, 60 mg at 01/29/15 1802 .  potassium chloride (K-DUR,KLOR-CON) CR tablet 10 mEq, 10 mEq, Oral, Once, Tama High III, MD .  predniSONE (DELTASONE) tablet 30 mg, 30 mg, Oral, Q breakfast, Tama High III, MD, 30 mg at 01/29/15 [phone] .  tamsulosin (FLOMAX) capsule 0.4 mg, 0.4 mg, Oral, Daily, Demetrios Loll, MD, 0.4 mg at 01/28/15 0924 .  tiotropium (SPIRIVA) inhalation capsule 18 mcg, 18 mcg, Inhalation, Daily, Flora Lipps, MD, 18  mcg at 01/29/15 1038 .  tiZANidine (ZANAFLEX) tablet 4 mg, 4 mg, Oral, Q8H PRN, Demetrios Loll, MD, 4 mg at 01/30/15 0455 .  warfarin (COUMADIN) tablet 2.5 mg, 2.5 mg, Oral, ONCE-1800, Tama High III, MD  PHYSICAL EXAM:  BP 179/109 mmHg  Pulse 78  Temp(Src) 97.8 F (36.6 C) (Oral)  Resp 19  Ht 5' 8.5" (1.74 m)  Wt 90.765 kg (200 lb 1.6 oz)  BMI 29.98 kg/m2  SpO2 94%  General: pleasant  male, in NAD HEENT: PERRL; OP moist without lesions.  Papular rash on back of head Neck: supple, trachea midline, no thyromegaly Chest: normal to palpation Lungs: coarse rales bilaterally, without retractions or wheezes Cardiovascular: RRR,  no gallop; distal pulses 2+ Abdomen: soft, nontender, slightly distended, positive bowel sounds Extremities: no clubbing, cyanosis, edema Neuro: alert and oriented, moves all extremities.  Chronic hoarseness.  Tremor in right hand, minimal Derm:  Rash as above; good skin turgor Lymph: no cervical or supraclavicular lymphadenopathy  Labs and imaging studies were reviewed  ASSESSMENT/PLAN:   1. Aspiration pneumonitis/acute resp failure/sepsis/group B strep bacteremia- convert to PO abx; cont mech soft with nectar thick, as per prior SLP recs. 2. DDD- Films with arthritis/DDD, but no fracture noted.  Cont PT, pain meds PRN. 3. DM- cont covering with SSI 4. Parkinsons- adjusted meds 5. CAD- BP up; tends to be labile, likely in part due to orthostasis related to Parkinsons and Sinemet 6. 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  Watching CBC, INR 7. D/c plan- up with PT as able to determine home needs.

## 2015-01-30 NOTE — Clinical Social Work Note (Signed)
CSW spoke to pt again to f/u on SNF placement.  Pt is currently refusing SNF placement at this time.  RNCM notified.  CSW signing off unless other CSW needs arise.

## 2015-01-30 NOTE — Care Management (Signed)
Patient admitted with respiratory failure due to aspiration pneumonia.  Lives at home with his wife .  Both use chronic 02 through [facility].  Physical therapy has recommended skilled nursing but patient wishes to return home with home health through [facility].  Says his wife would come and pick him up to transport home.  Has a walker and bedside commode.  Gave heads up referral to [facility] for SN PT Aide and SLP (for swallowing follow up).  Patient says he has been on nectar thick before.

## 2015-01-30 NOTE — Consult Note (Signed)
Date: 01/30/2015,   MRN# [id number] TION TSE [birth year] Code Status:     Code Status Orders        Start     Ordered   01/26/15 2206  Full code   Continuous     01/26/15 2205           AdmissionWeight: 194 lb 4 oz (88.111 kg)                 CurrentWeight: 200 lb 1.6 oz (90.765 kg) John Mendoza is a 69 y.o. old male seen in consultation for SOB    CHIEF COMPLAINT:  Follow SOB and resp distress  HISTORY OF PRESENT ILLNESS   Patient started on aggressive BD therapy and states that his SOB a little better today P  PAST MEDICAL HISTORY   Past Medical History  Diagnosis Date  . Barrett esophagus   . Hypertension   . Hyperlipidemia   . Depression   . Multiple pulmonary nodules   . CHF (congestive heart failure)   . Lumbar spinal stenosis   . S/P CABG (coronary artery bypass graft)   . COPD (chronic obstructive pulmonary disease)   . CAD (coronary artery disease)   . Basal cell carcinoma   . Diabetes mellitus   . Cancer of neck   . Heart attack      SURGICAL HISTORY   Past Surgical History  Procedure Laterality Date  . Bypass graft    . Cervical fusion    . Radial neck dissection    . Multiple stents    . Total knee arthroplasty  2011  . Thyroidectomy  1996     FAMILY HISTORY   Family History  Problem Relation Age of Onset  . Leukemia Paternal Grandmother   . Cancer Maternal Grandmother     stomach  . Cancer Paternal Aunt   . Other Father     bacterial endocarditis  . Rheum arthritis Father      SOCIAL HISTORY   History  Substance Use Topics  . Smoking status: Former Smoker -- 2.00 packs/day for 35 years    Types: Cigarettes    Quit date: 07/07/1994  . Smokeless tobacco: Never Used  . Alcohol Use: No     MEDICATIONS    Home Medication:  No current outpatient prescriptions on file.  Current Medication:  Current facility-administered medications:  .  acetaminophen (TYLENOL) tablet 650 mg, 650 mg, Oral, Q6H PRN, Harrie Foreman, MD, 650 mg at 01/26/15 2324 .  albuterol (PROVENTIL) (2.5 MG/3ML) 0.083% nebulizer solution 2.5 mg, 2.5 mg, Nebulization, Q4H, Flora Lipps, MD, 2.5 mg at 01/30/15 1239 .  aspirin EC tablet 81 mg, 81 mg, Oral, Daily, Demetrios Loll, MD, 81 mg at 01/30/15 0901 .  atorvastatin (LIPITOR) tablet 40 mg, 40 mg, Oral, QHS, Demetrios Loll, MD, 40 mg at 01/29/15 2100 .  bisacodyl (DULCOLAX) suppository 10 mg, 10 mg, Rectal, Daily PRN, Tama High III, MD .  bisoprolol (ZEBETA) tablet 10 mg, 10 mg, Oral, Daily, Demetrios Loll, MD, 10 mg at 01/29/15 1801 .  budesonide (PULMICORT) nebulizer solution 0.5 mg, 0.5 mg, Nebulization, BID, Flora Lipps, MD, 0.5 mg at 01/30/15 0758 .  carbidopa-levodopa (SINEMET IR) 25-100 MG per tablet immediate release 3 tablet, 3 tablet, Oral, TID, Tama High III, MD, 3 tablet at 01/30/15 [phone] .  cefUROXime (CEFTIN) tablet 500 mg, 500 mg, Oral, BID WC, Tama High III, MD, 500 mg at 01/30/15 0901 .  clonazePAM (KLONOPIN)  tablet 0.5 mg, 0.5 mg, Oral, TID PRN, Demetrios Loll, MD, 0.5 mg at 01/30/15 0901 .  docusate sodium (COLACE) capsule 100 mg, 100 mg, Oral, BID, Tama High III, MD, 100 mg at 01/30/15 4098 .  isosorbide mononitrate (IMDUR) 24 hr tablet 30 mg, 30 mg, Oral, Daily, Demetrios Loll, MD, 30 mg at 01/29/15 2100 .  lamoTRIgine (LAMICTAL) tablet 50 mg, 50 mg, Oral, BID, Tama High III, MD, 50 mg at 01/30/15 0901 .  levothyroxine (SYNTHROID, LEVOTHROID) tablet 137 mcg, 137 mcg, Oral, QAC breakfast, Tama High III, MD, 137 mcg at 01/30/15 0800 .  losartan (COZAAR) tablet 50 mg, 50 mg, Oral, BID, Tama High III, MD, 50 mg at 01/30/15 0901 .  magnesium hydroxide (MILK OF MAGNESIA) suspension 30 mL, 30 mL, Oral, Daily PRN, Tama High III, MD, 30 mL at 01/30/15 0903 .  mometasone-formoterol (DULERA) 200-5 MCG/ACT inhaler 2 puff, 2 puff, Inhalation, BID, Flora Lipps, MD, 2 puff at 01/30/15 0903 .  oxyCODONE-acetaminophen (PERCOCET/ROXICET) 5-325 MG per tablet 1 tablet, 1 tablet, Oral,  Q6H PRN, Tama High III, MD, 1 tablet at 01/30/15 1342 .  pantoprazole (PROTONIX) EC tablet 40 mg, 40 mg, Oral, Daily, Demetrios Loll, MD, 40 mg at 01/30/15 0901 .  PARoxetine (PAXIL) tablet 60 mg, 60 mg, Oral, Daily, Demetrios Loll, MD, 60 mg at 01/30/15 1342 .  predniSONE (DELTASONE) tablet 30 mg, 30 mg, Oral, Q breakfast, Tama High III, MD, 30 mg at 01/30/15 0902 .  tamsulosin (FLOMAX) capsule 0.4 mg, 0.4 mg, Oral, Daily, Demetrios Loll, MD, 0.4 mg at 01/30/15 0902 .  tiotropium (SPIRIVA) inhalation capsule 18 mcg, 18 mcg, Inhalation, Daily, Flora Lipps, MD, 18 mcg at 01/30/15 0903 .  tiZANidine (ZANAFLEX) tablet 4 mg, 4 mg, Oral, Q8H PRN, Demetrios Loll, MD, 4 mg at 01/30/15 1342    ALLERGIES   Other     REVIEW OF SYSTEMS   Review of Systems  Constitutional: Positive for malaise/fatigue.  HENT: Negative for hearing loss and tinnitus.   Eyes: Negative for blurred vision, double vision and photophobia.  Respiratory: Positive for cough, hemoptysis, sputum production, shortness of breath and wheezing.   Cardiovascular: Positive for orthopnea. Negative for chest pain, palpitations and leg swelling.  Gastrointestinal: Negative for heartburn, nausea, vomiting and abdominal pain.  Genitourinary: Negative for dysuria and urgency.  Musculoskeletal: Negative for myalgias and neck pain.  Neurological: Positive for weakness. Negative for dizziness, tingling, tremors, seizures and headaches.  Endo/Heme/Allergies: Negative for environmental allergies. Does not bruise/bleed easily.  Psychiatric/Behavioral: Negative for depression and suicidal ideas.     VS: BP 162/87 mmHg  Pulse 89  Temp(Src) 98.1 F (36.7 C) (Oral)  Resp 20  Ht 5' 8.5" (1.74 m)  Wt 200 lb 1.6 oz (90.765 kg)  BMI 29.98 kg/m2  SpO2 93%     PHYSICAL EXAM  Physical Exam  Constitutional: He is oriented to person, place, and time. He appears well-developed and well-nourished. No distress.  HENT:  Head: Normocephalic and atraumatic.   Mouth/Throat: No oropharyngeal exudate.  Eyes: EOM are normal. Pupils are equal, round, and reactive to light. No scleral icterus.  Neck: Normal range of motion. Neck supple.  Cardiovascular: Normal rate, regular rhythm and normal heart sounds.   No murmur heard. Pulmonary/Chest: No stridor. No respiratory distress. He has wheezes. He has rales.  Abdominal: Soft. Bowel sounds are normal. He exhibits no distension. There is no tenderness. There is no rebound.  Musculoskeletal: Normal range of motion.  He exhibits no edema.  Neurological: He is alert and oriented to person, place, and time. He displays normal reflexes. Coordination normal.  Skin: Skin is warm. He is not diaphoretic.  Psychiatric: He has a normal mood and affect.        LABS    Recent Labs     01/28/15  0454  01/29/15  0506  01/30/15  0410  HGB  9.6*  9.4*  9.2*  HCT  30.5*  30.4*  29.7*  MCV  76.0*  76.0*  75.4*  WBC  10.1  11.3*  10.1  BUN  16  16  14   CREATININE  0.79  0.64  0.68  GLUCOSE  196*  131*  164*  CALCIUM  8.5*  8.7*  8.8*  INR  2.79  3.71  3.10  ,    No results for input(s): PH in the last 72 hours.  Invalid input(s): PCO2, PO2, BASEEXCESS, BASEDEFICITE, TFT    CULTURE RESULTS   Recent Results (from the past 240 hour(s))  Blood Culture (routine x 2)     Status: None   Collection Time: 01/26/15  7:46 PM  Result Value Ref Range Status   Specimen Description BLOOD LEFT ASSIST CONTROL  Final   Special Requests BOTTLES DRAWN AEROBIC AND ANAEROBIC 5CC  Final   Culture  Setup Time   Final    GRAM POSITIVE COCCI IN CHAINS IN BOTH AEROBIC AND ANAEROBIC BOTTLES CRITICAL RESULT CALLED TO, READ BACK BY AND VERIFIED WITH: MYRA FLOWERS,RN AT 0800 01/27/2015 BY JRS    Culture   Final    STREPTOCOCCUS AGALACTIAE Virtually 100% of S. agalactiae (Group B) strains are susceptible to Penicillin.  For Penicillin-allergic patients, Erythromycin (85-95% sensitive) and Clindamycin (80% sensitive) are drugs  of choice. Contact microbiology lab to request sensitivities if  needed within 7 days.    Report Status 01/29/2015 FINAL  Final   Organism ID, Bacteria STREPTOCOCCUS AGALACTIAE  Final      Susceptibility   Streptococcus agalactiae - MIC*    VANCOMYCIN <=0.5 SENSITIVE Sensitive     CLINDAMYCIN <=0.25 RESISTANT Resistant     * STREPTOCOCCUS AGALACTIAE  Blood Culture (routine x 2)     Status: None   Collection Time: 01/26/15  7:46 PM  Result Value Ref Range Status   Specimen Description BLOOD LEFT HAND  Final   Special Requests BOTTLES DRAWN AEROBIC AND ANAEROBIC 5CC  Final   Culture  Setup Time   Final    GRAM POSITIVE COCCI IN CHAINS IN BOTH AEROBIC AND ANAEROBIC BOTTLES CRITICAL VALUE NOTED.  VALUE IS CONSISTENT WITH PREVIOUSLY REPORTED AND CALLED VALUE.    Culture   Final    STREPTOCOCCUS AGALACTIAE Virtually 100% of S. agalactiae (Group B) strains are susceptible to Penicillin.  For Penicillin-allergic patients, Erythromycin (85-95% sensitive) and Clindamycin (80% sensitive) are drugs of choice. Contact microbiology lab to request sensitivities if  needed within 7 days.    Report Status 01/29/2015 FINAL  Final   Organism ID, Bacteria STREPTOCOCCUS AGALACTIAE  Final      Susceptibility   Streptococcus agalactiae - MIC*    VANCOMYCIN <=0.5 SENSITIVE Sensitive     CLINDAMYCIN <=0.25 RESISTANT Resistant     * STREPTOCOCCUS AGALACTIAE  MRSA PCR Screening     Status: None   Collection Time: 01/26/15 10:00 PM  Result Value Ref Range Status   MRSA by PCR NEGATIVE NEGATIVE Final    Comment:        The GeneXpert MRSA  Assay (FDA approved for NASAL specimens only), is one component of a comprehensive MRSA colonization surveillance program. It is not intended to diagnose MRSA infection nor to guide or monitor treatment for MRSA infections.   Urine culture     Status: None   Collection Time: 01/27/15  4:39 AM  Result Value Ref Range Status   Specimen Description URINE, RANDOM   Final   Special Requests NONE  Final   Culture NO GROWTH 1 DAY  Final   Report Status 01/28/2015 FINAL  Final  Culture, expectorated sputum-assessment     Status: None   Collection Time: 01/28/15  3:05 PM  Result Value Ref Range Status   Specimen Description SPUTUM  Final   Special Requests NONE  Final   Sputum evaluation THIS SPECIMEN IS ACCEPTABLE FOR SPUTUM CULTURE  Final   Report Status 01/28/2015 FINAL  Final  Culture, respiratory (NON-Expectorated)     Status: None (Preliminary result)   Collection Time: 01/28/15  3:05 PM  Result Value Ref Range Status   Specimen Description SPUTUM  Final   Special Requests NONE Reflexed from [id number]  Final   Gram Stain   Final    EXCELLENT SPECIMEN - 90-100% WBCS MANY WBC SEEN MODERATE YEAST FEW GRAM NEGATIVE RODS    Culture MODERATE GROWTH YEAST IDENTIFICATION TO FOLLOW   Final   Report Status PENDING  Incomplete          IMAGING    No results found.     CXR reviewed  ASSESSMENT/PLAN   69 yo white male admitted for acute SOB from acute Pneumonia with Strep Agal bacteremia with COPD exacerbation  1.continue IV abx as prescribed 2.continue steroids-prednisone as prescribed 3.continue spiriva 4.continue  dulera 5.continue albuterol and Pulmicort nbes 6.oxygen as needed keep o2 sats 88-92% 7.follwo up sens to blood cultures and follow up sputum cultures   I have personally obtained a history, examined the patient, evaluated laboratory and independently reviewed imaging results, formulated the assessment and plan and placed orders.  The Patient requires high complexity decision making for assessment and support, frequent evaluation and titration of therapies, application of advanced monitoring technologies and extensive interpretation of multiple databases. Time spent with patient 45 minutes.  Patient is satisfied with Plan of action and management.    Corrin Parker, M.D.  [facility]   Medical Director [place] Director [facility] Cardio-Pulmonary Department

## 2015-01-30 NOTE — Progress Notes (Signed)
Physical Therapy Treatment Patient Details Name: John Mendoza MRN: [id number] DOB: 03/06/1946 Today's Date: 01/30/2015    History of Present Illness presented to ER secondary to progressive cough, SOB and generalized weakness; admitted with bilateral multilobar PNA with sepsis (requiring BiPAP upon arrival), now on 3L supplemental O2 via [place]. Chronic 5year history of LBP c lumbar spinal stenosis, deemed too risky for operative intervention  by two orthos. Pain significantly worse prior to admission secondary to chronic coughing.     PT Comments    Pt tolerating treatment session well, significantly improved c/o back pain today, prevsiously amb the room c nursing. Pt is pleasant and motivated/able to complete entire PT sesssion as planned. Pt continues to make progress toward goals as evidenced by improved tolerance to bed mobility, transfers, and ambulation.  Pt's greatest limitation continues to be O2 sats which continues to limit ability to perform all mobility at baseline function. Patient presenting with impairment of strength, pain, range of motion, and activity tolerance, limiting ability to perform ADL and mobility tasks at  baseline level of function. Patient will benefit from skilled intervention to address the above impairments and limitations, in order to restore to prior level of function, improve patient safety upon discharge, and to decrease caregiver burden.    Follow Up Recommendations  Home health PT     Equipment Recommendations   (Pt has RW at home, and [facility] which he prefers to use over walker. )    Recommendations for Other Services       Precautions / Restrictions Precautions Precautions: Fall Restrictions Weight Bearing Restrictions: No    Mobility  Bed Mobility Overal bed mobility: Modified Independent             General bed mobility comments: slow adn cautious, pain managed well, does not increase  Transfers Overall transfer level: Needs  assistance Equipment used: None Transfers: Sit to/from Stand Sit to Stand: Supervision         General transfer comment: performed 3x  Ambulation/Gait Ambulation/Gait assistance: Supervision Ambulation Distance (Feet): 140 Feet (broken into two bouts. resting sore legs halfway while seated. ) Assistive device: None;1 person hand held assist     Gait velocity interpretation: <1.8 ft/sec, indicative of risk for recurrent falls General Gait Details: alternating ambulation and retro ambulation, no LOB. Increased O2 to 4L to maintain O2 sats.    Stairs            Wheelchair Mobility    Modified Rankin (Stroke Patients Only)       Balance Overall balance assessment: No apparent balance deficits (not formally assessed)                                  Cognition Arousal/Alertness: Awake/alert Behavior During Therapy: WFL for tasks assessed/performed Overall Cognitive Status: Within Functional Limits for tasks assessed                      Exercises Other Exercises Other Exercises: Semi recumber reciprocal UE reaching: 20x cross body + rotation, 20x over head, 20x to contralateral knee  Other Exercises: Ankle pumps x20     General Comments        Pertinent Vitals/Pain Pain Assessment: 0-10 (Pt reports he is feeling a world of difference better today,. ) Pain Score: 2  Pain Intervention(s): Monitored during session;Premedicated before session    Home Living  Prior Function            PT Goals (current goals can now be found in the care plan section) Acute Rehab PT Goals Patient Stated Goal: "to go back home" PT Goal Formulation: With patient Time For Goal Achievement: 02/10/15 Potential to Achieve Goals: Good Progress towards PT goals: Progressing toward goals    Frequency  Min 2X/week    PT Plan Current plan remains appropriate    Co-evaluation             End of Session Equipment Utilized  During Treatment: Gait belt Activity Tolerance: Patient limited by fatigue (Leg pain, and dizziness, reports it feels like he is about to have a seizure. ) Patient left: in bed;with bed alarm set (seated EOB )     Time: 0737-1062 PT Time Calculation (min) (ACUTE ONLY): 26 min  Charges:  $Therapeutic Exercise: 8-22 mins $Therapeutic Activity: 8-22 mins                    G Codes:      Rainer Mounce C 02/15/2015, 3:20 PM  3:23 PM  Etta Grandchild, PT, DPT [place] License # [id number]

## 2015-01-30 NOTE — Progress Notes (Signed)
ANTIBIOTIC CONSULT NOTE - Follow up  Pharmacy Consult for Zosyn Indication: pneumonia  Allergies  Allergen Reactions  . Other Other (See Comments)    Pt states that inhaled medications make him depressed and have negative thoughts.      Patient Measurements: Height: 5' 8.5" (174 cm) Weight: 200 lb 1.6 oz (90.765 kg) IBW/kg (Calculated) : 69.55 Adjusted Body Weight: 76.3 kg  Vital Signs: BP: 179/109 mmHg (07/26 0405) Pulse Rate: 78 (07/26 0405) Intake/Output from previous day: 07/25 0701 - 07/26 0700 In: 480 [P.O.:480] Out: 700 [Urine:700] Intake/Output from this shift:    Labs:  Recent Labs  01/28/15 0454 01/29/15 0506 01/30/15 0410  WBC 10.1 11.3* 10.1  HGB 9.6* 9.4* 9.2*  PLT 171 201 204  CREATININE 0.79 0.64 0.68   Estimated Creatinine Clearance: 96.3 mL/min (by C-G formula based on Cr of 0.68). No results for input(s): VANCOTROUGH, VANCOPEAK, VANCORANDOM, GENTTROUGH, GENTPEAK, GENTRANDOM, TOBRATROUGH, TOBRAPEAK, TOBRARND, AMIKACINPEAK, AMIKACINTROU, AMIKACIN in the last 72 hours.   Microbiology: Recent Results (from the past 720 hour(s))  Blood Culture (routine x 2)     Status: None   Collection Time: 01/26/15  7:46 PM  Result Value Ref Range Status   Specimen Description BLOOD LEFT ASSIST CONTROL  Final   Special Requests BOTTLES DRAWN AEROBIC AND ANAEROBIC 5CC  Final   Culture  Setup Time   Final    GRAM POSITIVE COCCI IN CHAINS IN BOTH AEROBIC AND ANAEROBIC BOTTLES CRITICAL RESULT CALLED TO, READ BACK BY AND VERIFIED WITH: MYRA FLOWERS,RN AT 0800 01/27/2015 BY JRS    Culture   Final    STREPTOCOCCUS AGALACTIAE Virtually 100% of S. agalactiae (Group B) strains are susceptible to Penicillin.  For Penicillin-allergic patients, Erythromycin (85-95% sensitive) and Clindamycin (80% sensitive) are drugs of choice. Contact microbiology lab to request sensitivities if  needed within 7 days.    Report Status 01/29/2015 FINAL  Final   Organism ID, Bacteria  STREPTOCOCCUS AGALACTIAE  Final      Susceptibility   Streptococcus agalactiae - MIC*    VANCOMYCIN <=0.5 SENSITIVE Sensitive     CLINDAMYCIN <=0.25 RESISTANT Resistant     * STREPTOCOCCUS AGALACTIAE  Blood Culture (routine x 2)     Status: None   Collection Time: 01/26/15  7:46 PM  Result Value Ref Range Status   Specimen Description BLOOD LEFT HAND  Final   Special Requests BOTTLES DRAWN AEROBIC AND ANAEROBIC 5CC  Final   Culture  Setup Time   Final    GRAM POSITIVE COCCI IN CHAINS IN BOTH AEROBIC AND ANAEROBIC BOTTLES CRITICAL VALUE NOTED.  VALUE IS CONSISTENT WITH PREVIOUSLY REPORTED AND CALLED VALUE.    Culture   Final    STREPTOCOCCUS AGALACTIAE Virtually 100% of S. agalactiae (Group B) strains are susceptible to Penicillin.  For Penicillin-allergic patients, Erythromycin (85-95% sensitive) and Clindamycin (80% sensitive) are drugs of choice. Contact microbiology lab to request sensitivities if  needed within 7 days.    Report Status 01/29/2015 FINAL  Final   Organism ID, Bacteria STREPTOCOCCUS AGALACTIAE  Final      Susceptibility   Streptococcus agalactiae - MIC*    VANCOMYCIN <=0.5 SENSITIVE Sensitive     CLINDAMYCIN <=0.25 RESISTANT Resistant     * STREPTOCOCCUS AGALACTIAE  MRSA PCR Screening     Status: None   Collection Time: 01/26/15 10:00 PM  Result Value Ref Range Status   MRSA by PCR NEGATIVE NEGATIVE Final    Comment:        The  GeneXpert MRSA Assay (FDA approved for NASAL specimens only), is one component of a comprehensive MRSA colonization surveillance program. It is not intended to diagnose MRSA infection nor to guide or monitor treatment for MRSA infections.   Urine culture     Status: None   Collection Time: 01/27/15  4:39 AM  Result Value Ref Range Status   Specimen Description URINE, RANDOM  Final   Special Requests NONE  Final   Culture NO GROWTH 1 DAY  Final   Report Status 01/28/2015 FINAL  Final  Culture, expectorated sputum-assessment      Status: None   Collection Time: 01/28/15  3:05 PM  Result Value Ref Range Status   Specimen Description SPUTUM  Final   Special Requests NONE  Final   Sputum evaluation THIS SPECIMEN IS ACCEPTABLE FOR SPUTUM CULTURE  Final   Report Status 01/28/2015 FINAL  Final  Culture, respiratory (NON-Expectorated)     Status: None (Preliminary result)   Collection Time: 01/28/15  3:05 PM  Result Value Ref Range Status   Specimen Description SPUTUM  Final   Special Requests NONE Reflexed from [id number]  Final   Gram Stain   Final    EXCELLENT SPECIMEN - 90-100% WBCS MANY WBC SEEN MODERATE YEAST FEW GRAM NEGATIVE RODS    Culture PENDING  Incomplete   Report Status PENDING  Incomplete    Medical History: Past Medical History  Diagnosis Date  . Barrett esophagus   . Hypertension   . Hyperlipidemia   . Depression   . Multiple pulmonary nodules   . CHF (congestive heart failure)   . Lumbar spinal stenosis   . S/P CABG (coronary artery bypass graft)   . COPD (chronic obstructive pulmonary disease)   . CAD (coronary artery disease)   . Basal cell carcinoma   . Diabetes mellitus   . Cancer of neck   . Heart attack     Medications:  Scheduled:  . albuterol  2.5 mg Nebulization Q4H  . aspirin EC  81 mg Oral Daily  . atorvastatin  40 mg Oral QHS  . bisoprolol  10 mg Oral Daily  . budesonide (PULMICORT) nebulizer solution  0.5 mg Nebulization BID  . carbidopa-levodopa  3 tablet Oral TID  . docusate sodium  100 mg Oral BID  . furosemide  40 mg Oral Once  . isosorbide mononitrate  30 mg Oral Daily  . lamoTRIgine  50 mg Oral BID  . levothyroxine (SYNTHROID, LEVOTHROID) tablet 137 mcg  137 mcg Oral QAC breakfast  . losartan  50 mg Oral BID  . mometasone-formoterol  2 puff Inhalation BID  . pantoprazole  40 mg Oral Daily  . PARoxetine  60 mg Oral Daily  . piperacillin-tazobactam (ZOSYN)  IV  3.375 g Intravenous 3 times per day  . potassium chloride  10 mEq Oral Once  . predniSONE  30 mg  Oral Q breakfast  . tamsulosin  0.4 mg Oral Daily  . tiotropium  18 mcg Inhalation Daily  . warfarin  2.5 mg Oral ONCE-1800   Infusions:    PRN: acetaminophen, clonazePAM, oxyCODONE-acetaminophen, tiZANidine  Assessment: 69 y/o M ordered empiric abx for PNA, likely due to aspiration. Vanc/Zosyn/azithro narrowed to Zosyn alone.   Sputum cx pending BCx x2 GBS UCx NG   Plan:   Will continue Zosyn 3.375 g EI q 8 h.    Rocky Morel 01/30/2015,7:47 AM

## 2015-01-30 NOTE — Progress Notes (Signed)
ANTICOAGULATION CONSULT NOTE - Follow up [facility] for Coumadin Indication: h/o DVT  Allergies  Allergen Reactions  . Other Other (See Comments)    Pt states that inhaled medications make him depressed and have negative thoughts.      Patient Measurements: Height: 5' 8.5" (174 cm) Weight: 200 lb 1.6 oz (90.765 kg) IBW/kg (Calculated) : 69.55   Vital Signs: Temp: 97.8 F (36.6 C) (07/25 1923) Temp Source: Oral (07/25 1923) BP: 179/109 mmHg (07/26 0405) Pulse Rate: 78 (07/26 0405)  Labs:  Recent Labs  01/28/15 0454 01/29/15 0506 01/30/15 0410  HGB 9.6* 9.4* 9.2*  HCT 30.5* 30.4* 29.7*  PLT 171 201 204  LABPROT 29.5* 36.7* 32.0*  INR 2.79 3.71 3.10  CREATININE 0.79 0.64 0.68    Estimated Creatinine Clearance: 96.3 mL/min (by C-G formula based on Cr of 0.68).   Medical History: Past Medical History  Diagnosis Date  . Barrett esophagus   . Hypertension   . Hyperlipidemia   . Depression   . Multiple pulmonary nodules   . CHF (congestive heart failure)   . Lumbar spinal stenosis   . S/P CABG (coronary artery bypass graft)   . COPD (chronic obstructive pulmonary disease)   . CAD (coronary artery disease)   . Basal cell carcinoma   . Diabetes mellitus   . Cancer of neck   . Heart attack     Medications:  Prescriptions prior to admission  Medication Sig Dispense Refill Last Dose  . amLODipine (NORVASC) 5 MG tablet Take 5 mg by mouth daily.   unknown at unknown  . aspirin EC 81 MG tablet Take 81 mg by mouth daily.   unknown at unknown  . atorvastatin (LIPITOR) 40 MG tablet Take 40 mg by mouth at bedtime.   unknown at unknown  . bisoprolol (ZEBETA) 10 MG tablet Take 10 mg by mouth daily.   unknown at unknown  . carbidopa-levodopa (SINEMET IR) 25-100 MG per tablet Take 2 tablets by mouth 3 (three) times daily.    unknown at unknown  . clonazePAM (KLONOPIN) 1 MG tablet Take 0.5 mg by mouth 3 (three) times daily as needed for anxiety.   PRN at PRN   . HYDROcodone-acetaminophen (NORCO/VICODIN) 5-325 MG per tablet Take 1 tablet by mouth every 8 (eight) hours as needed for moderate pain.   PRN at PRN  . ipratropium-albuterol (DUONEB) 0.5-2.5 (3) MG/3ML SOLN Take 3 mLs by nebulization every 6 (six) hours as needed (for shortness of breath).    PRN at PRN  . isosorbide mononitrate (IMDUR) 30 MG 24 hr tablet Take 30 mg by mouth daily.   unknown at unknown  . lamoTRIgine (LAMICTAL) 25 MG tablet Take 25 mg by mouth 2 (two) times daily.   unknown at unknown  . levothyroxine (SYNTHROID, LEVOTHROID) 137 MCG tablet Take 137 mcg by mouth daily.   unknown at unknown  . losartan (COZAAR) 100 MG tablet Take 100 mg by mouth daily.   unknown at unknown  . metFORMIN (GLUCOPHAGE) 500 MG tablet Take 500 mg by mouth 2 (two) times daily.    unknown at unknown  . omeprazole (PRILOSEC) 20 MG capsule Take 40 mg by mouth 2 (two) times daily.   unknown at unknown  . PARoxetine (PAXIL) 20 MG tablet Take 60 mg by mouth daily.    unknown at unknown  . Sennosides 25 MG TABS Take 1 tablet by mouth as needed (for constipation).   PRN at PRN  . tamsulosin (FLOMAX)  0.4 MG CAPS Take 0.4 mg by mouth daily.    unknown at unknown  . tiZANidine (ZANAFLEX) 4 MG tablet Take 4 mg by mouth every 8 (eight) hours as needed for muscle spasms.   PRN at PRN  . warfarin (COUMADIN) 4 MG tablet Take 4 mg by mouth every evening.    unknown at unknown  . budesonide (PULMICORT) 0.25 MG/2ML nebulizer solution Take 2 mLs (0.25 mg total) by nebulization as needed. (Patient not taking: Reported on 07/12/2014) 60 mL 5 Not Taking   Scheduled:  . albuterol  2.5 mg Nebulization Q4H  . aspirin EC  81 mg Oral Daily  . atorvastatin  40 mg Oral QHS  . bisoprolol  10 mg Oral Daily  . budesonide (PULMICORT) nebulizer solution  0.5 mg Nebulization BID  . carbidopa-levodopa  3 tablet Oral TID  . docusate sodium  100 mg Oral BID  . isosorbide mononitrate  30 mg Oral Daily  . lamoTRIgine  50 mg Oral BID  .  levothyroxine (SYNTHROID, LEVOTHROID) tablet 137 mcg  137 mcg Oral QAC breakfast  . losartan  50 mg Oral Daily  . mometasone-formoterol  2 puff Inhalation BID  . pantoprazole  40 mg Oral Daily  . PARoxetine  60 mg Oral Daily  . piperacillin-tazobactam (ZOSYN)  IV  3.375 g Intravenous 3 times per day  . predniSONE  30 mg Oral Q breakfast  . tamsulosin  0.4 mg Oral Daily  . tiotropium  18 mcg Inhalation Daily   Infusions:    PRN: acetaminophen, clonazePAM, oxyCODONE-acetaminophen, tiZANidine  Assessment: Patient on Coumadin 4 mg daily PTA presumably for h/o DVT with almost therapeutic INR.  Pt also started on abx (azithromycin, vancomycin, Zosyn)  7/22 INR 1.87 - warfarin 5 mg 7/23 INR 1.86 - warfarin 5 mg 7/24 INR 2.79 - Warfarin HELD 7/25 INR 3.71 7/26 INR 3.1 Warfarin 2.5 mg ordered.   Goal of Therapy:  INR 2-3    Plan:  INR therapeutic today but large increase in INR (almost a whole point), likely have not seen full effect of yesterday's dose. ?drug-drug interaction with azithromycin vs poorer PO intake. Will hold warfarin again for today 7/25 and f/u AM INR.    Sim Boast, PharmD, BCPS  01/30/2015

## 2015-01-31 LAB — BASIC METABOLIC PANEL
Anion gap: 7 (ref 5–15)
BUN: 13 mg/dL (ref 6–20)
CALCIUM: 9 mg/dL (ref 8.9–10.3)
CHLORIDE: 96 mmol/L — AB (ref 101–111)
CO2: 36 mmol/L — ABNORMAL HIGH (ref 22–32)
Creatinine, Ser: 0.67 mg/dL (ref 0.61–1.24)
GFR calc Af Amer: >60 mL/min (ref >60)
GFR calc non Af Amer: >60 mL/min (ref >60)
Glucose, Bld: 149 mg/dL — ABNORMAL HIGH (ref 65–99)
POTASSIUM: 4.2 mmol/L (ref 3.5–5.1)
Sodium: 139 mmol/L (ref 135–145)

## 2015-01-31 LAB — PROTIME-INR
INR: 2.57
Prothrombin Time: 27.7 seconds — ABNORMAL HIGH (ref 11.4–15.0)

## 2015-01-31 LAB — CULTURE, RESPIRATORY

## 2015-01-31 LAB — CULTURE, RESPIRATORY W GRAM STAIN

## 2015-01-31 MED ORDER — AMLODIPINE BESYLATE 5 MG PO TABS
2.5000 mg | ORAL_TABLET | Freq: Every day | ORAL | Status: DC
  Administered 2015-01-31: 2.5 mg via ORAL
  Filled 2015-01-31: qty 1

## 2015-01-31 MED ORDER — WARFARIN SODIUM 3 MG PO TABS
3.0000 mg | ORAL_TABLET | Freq: Every day | ORAL | Status: DC
  Administered 2015-01-31: 3 mg via ORAL
  Filled 2015-01-31: qty 1

## 2015-01-31 MED ORDER — WARFARIN - PHYSICIAN DOSING INPATIENT
Freq: Every day | Status: DC
  Administered 2015-02-01: 03:00:00

## 2015-01-31 NOTE — Progress Notes (Signed)
   01/31/15 2200  Clinical Encounter Type  Visited With Patient  Visit Type Spiritual support  Spiritual Encounters  Spiritual Needs Grief support  Stress Factors  Patient Stress Factors Health changes   Status: alert and oriented/Aspiration pneumonia/grieving Family: None present but they are supportive he said Age/Sex: male 21 Visit Assessment: The patient shared that his health changes are not getting better. He said, "shoot me." By the end of the visit, he seemed to have had a spiritual lift. He shared his life review: from working as a Public librarian to Pharmacist, hospital to retirement; from being a cancer survivor to Parkinsons which may be causing his back probs, he said. He said Dr. Raliegh Ip is concerned about his health changes. He concluded with stories about being a native [employer] and burping his niece by marriage. The chaplain gave encouraging words.  Pastoral care can be reached via pager [phone] or by submitting an online request

## 2015-01-31 NOTE — Progress Notes (Signed)
Patient ID: John Mendoza, male   DOB: 01/05/1946  SUBJECTIVE:  Admitted with acute resp failure; has h/o recurrent aspiration due to prior head and neck cancer treatment as well as positioning of neck due to cervical DDD; has CAD, COPD, DM, seizure disorder, Parkinson's, h/o MAI, h/o ANCA positive/MPA with prior treatment at [facility]. Switched to PO abx and remains afebrile.  Breathing better as well.  BP remains labile.  No chest pain.  Having more back pain this AM  ______________________________________________________________________  ROS: Please see HPI; remainder of complete 10 point ROS is negative   Past Medical History  Diagnosis Date  . Barrett esophagus   . Hypertension   . Hyperlipidemia   . Depression   . Multiple pulmonary nodules   . CHF (congestive heart failure)   . Lumbar spinal stenosis   . S/P CABG (coronary artery bypass graft)   . COPD (chronic obstructive pulmonary disease)   . CAD (coronary artery disease)   . Basal cell carcinoma   . Diabetes mellitus   . Cancer of neck   . Heart attack     Past Surgical History  Procedure Laterality Date  . Bypass graft    . Cervical fusion    . Radial neck dissection    . Multiple stents    . Total knee arthroplasty  2011  . Thyroidectomy  1996     Current facility-administered medications:  .  acetaminophen (TYLENOL) tablet 650 mg, 650 mg, Oral, Q6H PRN, Harrie Foreman, MD, 650 mg at 01/26/15 2324 .  albuterol (PROVENTIL) (2.5 MG/3ML) 0.083% nebulizer solution 2.5 mg, 2.5 mg, Nebulization, Q4H, Flora Lipps, MD, 2.5 mg at 01/31/15 0426 .  amLODipine (NORVASC) tablet 2.5 mg, 2.5 mg, Oral, QHS, Tama High III, MD .  aspirin EC tablet 81 mg, 81 mg, Oral, Daily, Demetrios Loll, MD, 81 mg at 01/30/15 0901 .  atorvastatin (LIPITOR) tablet 40 mg, 40 mg, Oral, QHS, Demetrios Loll, MD, 40 mg at 01/30/15 2150 .  bisacodyl (DULCOLAX) suppository 10 mg, 10 mg, Rectal, Daily PRN, Tama High III, MD .  bisoprolol (ZEBETA) tablet  10 mg, 10 mg, Oral, Daily, Demetrios Loll, MD, 10 mg at 01/30/15 2150 .  budesonide (PULMICORT) nebulizer solution 0.5 mg, 0.5 mg, Nebulization, BID, Flora Lipps, MD, 0.5 mg at 01/30/15 2043 .  carbidopa-levodopa (SINEMET IR) 25-100 MG per tablet immediate release 3 tablet, 3 tablet, Oral, TID, Tama High III, MD, 3 tablet at 01/30/15 2150 .  cefUROXime (CEFTIN) tablet 500 mg, 500 mg, Oral, BID WC, Tama High III, MD, 500 mg at 01/30/15 1729 .  clonazePAM (KLONOPIN) tablet 0.5 mg, 0.5 mg, Oral, TID PRN, Demetrios Loll, MD, 0.5 mg at 01/30/15 2150 .  docusate sodium (COLACE) capsule 100 mg, 100 mg, Oral, BID, Tama High III, MD, 100 mg at 01/30/15 2150 .  isosorbide mononitrate (IMDUR) 24 hr tablet 30 mg, 30 mg, Oral, Daily, Demetrios Loll, MD, 30 mg at 01/30/15 2150 .  lamoTRIgine (LAMICTAL) tablet 50 mg, 50 mg, Oral, BID, Tama High III, MD, 50 mg at 01/30/15 2150 .  levothyroxine (SYNTHROID, LEVOTHROID) tablet 137 mcg, 137 mcg, Oral, QAC breakfast, Tama High III, MD, 137 mcg at 01/30/15 0800 .  losartan (COZAAR) tablet 50 mg, 50 mg, Oral, BID, Tama High III, MD, 50 mg at 01/30/15 2150 .  magnesium hydroxide (MILK OF MAGNESIA) suspension 30 mL, 30 mL, Oral, Daily PRN, Tama High III, MD, 30 mL at  01/30/15 0903 .  mometasone-formoterol (DULERA) 200-5 MCG/ACT inhaler 2 puff, 2 puff, Inhalation, BID, Flora Lipps, MD, 2 puff at 01/30/15 2150 .  oxyCODONE-acetaminophen (PERCOCET/ROXICET) 5-325 MG per tablet 1 tablet, 1 tablet, Oral, Q6H PRN, Tama High III, MD, 1 tablet at 01/31/15 0510 .  pantoprazole (PROTONIX) EC tablet 40 mg, 40 mg, Oral, Daily, Demetrios Loll, MD, 40 mg at 01/30/15 0901 .  PARoxetine (PAXIL) tablet 60 mg, 60 mg, Oral, Daily, Demetrios Loll, MD, 60 mg at 01/30/15 1342 .  predniSONE (DELTASONE) tablet 30 mg, 30 mg, Oral, Q breakfast, Tama High III, MD, 30 mg at 01/30/15 0902 .  tamsulosin (FLOMAX) capsule 0.4 mg, 0.4 mg, Oral, Daily, Demetrios Loll, MD, 0.4 mg at 01/30/15 0902 .  tiotropium  (SPIRIVA) inhalation capsule 18 mcg, 18 mcg, Inhalation, Daily, Flora Lipps, MD, 18 mcg at 01/30/15 0903 .  tiZANidine (ZANAFLEX) tablet 4 mg, 4 mg, Oral, Q8H PRN, Demetrios Loll, MD, 4 mg at 01/31/15 0510 .  warfarin (COUMADIN) tablet 3 mg, 3 mg, Oral, q1800, Adin Hector, MD .  Warfarin - Physician Dosing Inpatient, , Does not apply, q1800, Adin Hector, MD  PHYSICAL EXAM:  BP 172/88 mmHg  Pulse 68  Temp(Src) 98.3 F (36.8 C) (Oral)  Resp 20  Ht 5' 8.5" (1.74 m)  Wt 90.765 kg (200 lb 1.6 oz)  BMI 29.98 kg/m2  SpO2 97%  General: pleasant  male, in NAD HEENT: PERRL; OP moist without lesions.  Papular rash on back of head Neck: supple, trachea midline, no thyromegaly Chest: normal to palpation Lungs: coarse rales bilaterally, with slight improvement, without retractions or wheezes Cardiovascular: RRR,  no gallop; distal pulses 2+ Abdomen: soft, nontender, slightly distended, positive bowel sounds Extremities: no clubbing, cyanosis, edema Neuro: alert, moves all extremities.  Chronic hoarseness.  Derm:  Rash as above; good skin turgor Lymph: no cervical or supraclavicular lymphadenopathy  Labs and imaging studies were reviewed  ASSESSMENT/PLAN:   1. Aspiration pneumonitis/acute resp failure/sepsis/group B strep bacteremia- remains afebrile on PO abx; cont mech soft with nectar thick, as per prior SLP recs.  Films show improvement 2. DDD- Films with arthritis/DDD, but no fracture noted.  Cont PT, pain meds PRN.  Will need to see how he does when he's up today.   3. DM- cont covering with SSI 4. Parkinsons- on home regimen 5. CAD/accelerated hypertension- cont to adjust meds 6. H/o DVT- restart coumadin.  Watching CBC, INR 7. D/c plan- Doesn't want to consider STR; plan is for home with HHPT.  Will reevaluate at lunch hr; if bck pain improved, may be able to go home. Has home O2 in place and requesting [place], MD

## 2015-01-31 NOTE — Care Management Important Message (Signed)
Important Message  Patient Details  Name: John Mendoza MRN: [id number] Date of Birth: [birth year]   Medicare Important Message Given:  Yes-third notification given    [name] 01/31/2015, 11:06 AM

## 2015-01-31 NOTE — Progress Notes (Signed)
Physical Therapy Treatment Patient Details Name: John Mendoza MRN: [id number] DOB: 1945/12/30 Today's Date: 01/31/2015    History of Present Illness presented to ER secondary to progressive cough, SOB and generalized weakness; admitted with bilateral multilobar PNA with sepsis (requiring BiPAP upon arrival), now on 3L supplemental O2 via [place]. Chronic 5year history of LBP c lumbar spinal stenosis, deemed too risky for operative intervention  by two orthos. Pain significantly worse prior to admission secondary to chronic coughing.     PT Comments    Pt tolerating treatment session well, with 10/10 pain in bed improving to 7/10 c ambulation. Pt is highly motivated, able to complete most of PT sesssion as planned, however, ambulation cut short due to low O2Sats. Pt presenting c significant increase in pain compared to treatment yesterday. Pt does not respond favorably to treatment measures that are typically palliative in pts presenting with lumbar spine originating pain. Of continued concern are 10/10 pain aggravated by palpation of lower lateral abdomen on R, as well as bilateral myoclonus in ankle planter-flexors. Made attempts to clear the SI joints involvement of pain, but pt is generally too painful to tolerate testing. Pt continues to make progress toward goals as evidenced by improved tolerance to ambulation. Pt's greatest limitation continues to be desaturation c activity, and a lack of effective strategies for self management of back pain, which continues to limit ability to perform all mobility at baseline function. Patient presenting with impairment of strength, pain, range of motion, and activity tolerance, limiting ability to perform ADL and mobility tasks at  baseline level of function. Patient will benefit from skilled intervention to address the above impairments and limitations, in order to restore to prior level of function, improve patient safety upon discharge, and to decrease caregiver  burden.    Follow Up Recommendations  Home health PT     Equipment Recommendations       Recommendations for Other Services       Precautions / Restrictions Precautions Precautions: Fall Restrictions Weight Bearing Restrictions: No    Mobility  Bed Mobility Overal bed mobility: Modified Independent             General bed mobility comments: slow and cautious, pain managed well, does not increase  Transfers Overall transfer level: Needs assistance Equipment used: None Transfers: Sit to/from Stand Sit to Stand: Supervision            Ambulation/Gait Ambulation/Gait assistance: Supervision Ambulation Distance (Feet): 190 Feet Assistive device: None (Attempted RW under assumption it would unload lumbar spine, however, pt found it to worsen LBP. ) Gait Pattern/deviations: Antalgic Gait velocity: 0.13m/s  Gait velocity interpretation: <1.8 ft/sec, indicative of risk for recurrent falls General Gait Details: very slow. Desats to 80% on 2L p 80 170ft. Bumped to 4L, recovering to 86% p 30s standing recovery.    Stairs            Wheelchair Mobility    Modified Rankin (Stroke Patients Only)       Balance Overall balance assessment: Modified Independent                                  Cognition Arousal/Alertness: Awake/alert Behavior During Therapy: WFL for tasks assessed/performed Overall Cognitive Status: Within Functional Limits for tasks assessed                      Exercises Other Exercises Other Exercises:  Single knee to chest 1x30 bilat PROM.   Other Exercises: Hip traction 1x3 minutes bilat, performed in trunk/knee flexion and knees bent.  (tolerates well, mild aggravation of symptoms into legs. )    General Comments        Pertinent Vitals/Pain Pain Assessment: 0-10 Pain Score: 10-Worst pain ever (10/10 c palapation of LLQ/RLQ near pelvis. resolves to 7/10 once ambulating in hallway. ) Pain Location: R lower  flank, down to posterio pelvis. palaption of L pelvis refers to the R side as well.  Pain Descriptors / Indicators: Aching;Sharp;Shooting Pain Intervention(s): Limited activity within patient's tolerance;Monitored during session;RN gave pain meds during session    Home Living                      Prior Function            PT Goals (current goals can now be found in the care plan section) Acute Rehab PT Goals Patient Stated Goal: "to go back home" PT Goal Formulation: With patient Time For Goal Achievement: 02/10/15 Potential to Achieve Goals: Good Progress towards PT goals: PT to reassess next treatment    Frequency  Min 2X/week    PT Plan Current plan remains appropriate    Co-evaluation             End of Session Equipment Utilized During Treatment: Gait belt Activity Tolerance: Other (comment) (Pain improving, but pt assymptomatic of desaturation. ) Patient left: in bed;with nursing/sitter in room     Time: 2446-9507 PT Time Calculation (min) (ACUTE ONLY): 41 min  Charges:  $Therapeutic Activity: 23-37 mins                    G Codes:      Jodell Weitman C Feb 10, 2015, 1:11 PM  1:19 PM  Etta Grandchild, PT, DPT [place] License # [id number]

## 2015-02-01 ENCOUNTER — Inpatient Hospital Stay: Payer: Medicare Other

## 2015-02-01 ENCOUNTER — Encounter (HOSPITAL_COMMUNITY): Payer: Self-pay | Admitting: General Practice

## 2015-02-01 ENCOUNTER — Inpatient Hospital Stay (HOSPITAL_COMMUNITY)
Admission: AD | Admit: 2015-02-01 | Discharge: 2015-02-08 | DRG: 094 | Disposition: A | Discharge status: Home Health | Payer: Medicare Other | Source: Other Acute Inpatient Hospital | Attending: Internal Medicine | Admitting: Internal Medicine

## 2015-02-01 DIAGNOSIS — G20 Parkinson's disease: Secondary | ICD-10-CM | POA: Diagnosis present

## 2015-02-01 DIAGNOSIS — M4640 Discitis, unspecified, site unspecified: Secondary | ICD-10-CM

## 2015-02-01 DIAGNOSIS — I10 Essential (primary) hypertension: Secondary | ICD-10-CM | POA: Diagnosis present

## 2015-02-01 DIAGNOSIS — Z79899 Other long term (current) drug therapy: Secondary | ICD-10-CM | POA: Diagnosis not present

## 2015-02-01 DIAGNOSIS — K219 Gastro-esophageal reflux disease without esophagitis: Secondary | ICD-10-CM | POA: Diagnosis present

## 2015-02-01 DIAGNOSIS — R1310 Dysphagia, unspecified: Secondary | ICD-10-CM

## 2015-02-01 DIAGNOSIS — Z7952 Long term (current) use of systemic steroids: Secondary | ICD-10-CM | POA: Diagnosis not present

## 2015-02-01 DIAGNOSIS — M310 Hypersensitivity angiitis: Secondary | ICD-10-CM | POA: Diagnosis present

## 2015-02-01 DIAGNOSIS — J690 Pneumonitis due to inhalation of food and vomit: Secondary | ICD-10-CM | POA: Diagnosis present

## 2015-02-01 DIAGNOSIS — E039 Hypothyroidism, unspecified: Secondary | ICD-10-CM | POA: Diagnosis present

## 2015-02-01 DIAGNOSIS — M4646 Discitis, unspecified, lumbar region: Secondary | ICD-10-CM | POA: Diagnosis present

## 2015-02-01 DIAGNOSIS — J9621 Acute and chronic respiratory failure with hypoxia: Secondary | ICD-10-CM | POA: Diagnosis present

## 2015-02-01 DIAGNOSIS — I252 Old myocardial infarction: Secondary | ICD-10-CM | POA: Diagnosis not present

## 2015-02-01 DIAGNOSIS — Z85819 Personal history of malignant neoplasm of unspecified site of lip, oral cavity, and pharynx: Secondary | ICD-10-CM

## 2015-02-01 DIAGNOSIS — I2510 Atherosclerotic heart disease of native coronary artery without angina pectoris: Secondary | ICD-10-CM | POA: Diagnosis present

## 2015-02-01 DIAGNOSIS — Z923 Personal history of irradiation: Secondary | ICD-10-CM | POA: Diagnosis not present

## 2015-02-01 DIAGNOSIS — R1319 Other dysphagia: Secondary | ICD-10-CM | POA: Diagnosis not present

## 2015-02-01 DIAGNOSIS — Z7901 Long term (current) use of anticoagulants: Secondary | ICD-10-CM

## 2015-02-01 DIAGNOSIS — E119 Type 2 diabetes mellitus without complications: Secondary | ICD-10-CM | POA: Diagnosis present

## 2015-02-01 DIAGNOSIS — Z951 Presence of aortocoronary bypass graft: Secondary | ICD-10-CM | POA: Diagnosis not present

## 2015-02-01 DIAGNOSIS — J449 Chronic obstructive pulmonary disease, unspecified: Secondary | ICD-10-CM | POA: Diagnosis present

## 2015-02-01 DIAGNOSIS — Z981 Arthrodesis status: Secondary | ICD-10-CM | POA: Diagnosis not present

## 2015-02-01 DIAGNOSIS — R509 Fever, unspecified: Secondary | ICD-10-CM | POA: Diagnosis not present

## 2015-02-01 DIAGNOSIS — M4626 Osteomyelitis of vertebra, lumbar region: Secondary | ICD-10-CM | POA: Diagnosis not present

## 2015-02-01 DIAGNOSIS — R7881 Bacteremia: Secondary | ICD-10-CM | POA: Diagnosis present

## 2015-02-01 DIAGNOSIS — M4620 Osteomyelitis of vertebra, site unspecified: Secondary | ICD-10-CM | POA: Diagnosis present

## 2015-02-01 DIAGNOSIS — Z961 Presence of intraocular lens: Secondary | ICD-10-CM | POA: Diagnosis present

## 2015-02-01 DIAGNOSIS — K2270 Barrett's esophagus without dysplasia: Secondary | ICD-10-CM | POA: Diagnosis present

## 2015-02-01 DIAGNOSIS — Z9841 Cataract extraction status, right eye: Secondary | ICD-10-CM | POA: Diagnosis not present

## 2015-02-01 DIAGNOSIS — G061 Intraspinal abscess and granuloma: Secondary | ICD-10-CM | POA: Diagnosis present

## 2015-02-01 DIAGNOSIS — R768 Other specified abnormal immunological findings in serum: Secondary | ICD-10-CM | POA: Diagnosis not present

## 2015-02-01 DIAGNOSIS — K5900 Constipation, unspecified: Secondary | ICD-10-CM | POA: Diagnosis not present

## 2015-02-01 DIAGNOSIS — R918 Other nonspecific abnormal finding of lung field: Secondary | ICD-10-CM | POA: Diagnosis present

## 2015-02-01 DIAGNOSIS — K567 Ileus, unspecified: Secondary | ICD-10-CM

## 2015-02-01 DIAGNOSIS — B9689 Other specified bacterial agents as the cause of diseases classified elsewhere: Secondary | ICD-10-CM | POA: Diagnosis not present

## 2015-02-01 DIAGNOSIS — G062 Extradural and subdural abscess, unspecified: Secondary | ICD-10-CM

## 2015-02-01 DIAGNOSIS — Z9981 Dependence on supplemental oxygen: Secondary | ICD-10-CM | POA: Diagnosis not present

## 2015-02-01 DIAGNOSIS — B951 Streptococcus, group B, as the cause of diseases classified elsewhere: Secondary | ICD-10-CM | POA: Diagnosis present

## 2015-02-01 DIAGNOSIS — Z87891 Personal history of nicotine dependence: Secondary | ICD-10-CM | POA: Diagnosis not present

## 2015-02-01 DIAGNOSIS — Z9842 Cataract extraction status, left eye: Secondary | ICD-10-CM | POA: Diagnosis not present

## 2015-02-01 DIAGNOSIS — I509 Heart failure, unspecified: Secondary | ICD-10-CM | POA: Diagnosis present

## 2015-02-01 DIAGNOSIS — Z86718 Personal history of other venous thrombosis and embolism: Secondary | ICD-10-CM | POA: Diagnosis not present

## 2015-02-01 HISTORY — DX: Acute embolism and thrombosis of unspecified deep veins of unspecified lower extremity: I82.409

## 2015-02-01 HISTORY — DX: Hypothyroidism, unspecified: E03.9

## 2015-02-01 HISTORY — DX: Personal history of other diseases of the musculoskeletal system and connective tissue: Z87.39

## 2015-02-01 HISTORY — DX: Unspecified convulsions: R56.9

## 2015-02-01 HISTORY — DX: Dependence on supplemental oxygen: Z99.81

## 2015-02-01 HISTORY — DX: Unspecified osteoarthritis, unspecified site: M19.90

## 2015-02-01 HISTORY — DX: Low back pain, unspecified: M54.50

## 2015-02-01 HISTORY — DX: Sleep apnea, unspecified: G47.30

## 2015-02-01 HISTORY — DX: Malignant neoplasm of pharynx, unspecified: C14.0

## 2015-02-01 HISTORY — DX: Low back pain: M54.5

## 2015-02-01 HISTORY — DX: Other chronic pain: G89.29

## 2015-02-01 HISTORY — DX: Iron deficiency anemia, unspecified: D50.9

## 2015-02-01 HISTORY — DX: Parkinson's disease without dyskinesia, without mention of fluctuations: G20.A1

## 2015-02-01 HISTORY — DX: Anxiety disorder, unspecified: F41.9

## 2015-02-01 HISTORY — DX: Pneumonitis due to inhalation of food and vomit: J69.0

## 2015-02-01 HISTORY — DX: Parkinson's disease: G20

## 2015-02-01 HISTORY — DX: Type 2 diabetes mellitus without complications: E11.9

## 2015-02-01 HISTORY — DX: Gastro-esophageal reflux disease without esophagitis: K21.9

## 2015-02-01 LAB — CBC WITH DIFFERENTIAL/PLATELET
Basophils Absolute: 0 10*3/uL (ref 0.0–0.1)
Basophils Relative: 0 % (ref 0–1)
EOS ABS: 0 10*3/uL (ref 0.0–0.7)
Eosinophils Relative: 0 % (ref 0–5)
HCT: 30.3 % — ABNORMAL LOW (ref 39.0–52.0)
Hemoglobin: 9.4 g/dL — ABNORMAL LOW (ref 13.0–17.0)
Lymphocytes Relative: 3 % — ABNORMAL LOW (ref 12–46)
Lymphs Abs: 0.3 10*3/uL — ABNORMAL LOW (ref 0.7–4.0)
MCH: 23.6 pg — AB (ref 26.0–34.0)
MCHC: 31 g/dL (ref 30.0–36.0)
MCV: 76.1 fL — AB (ref 78.0–100.0)
MONOS PCT: 3 % (ref 3–12)
Monocytes Absolute: 0.3 10*3/uL (ref 0.1–1.0)
NEUTROS ABS: 8.1 10*3/uL — AB (ref 1.7–7.7)
NEUTROS PCT: 94 % — AB (ref 43–77)
PLATELETS: 261 10*3/uL (ref 150–400)
RBC: 3.98 MIL/uL — ABNORMAL LOW (ref 4.22–5.81)
RDW: 17.7 % — ABNORMAL HIGH (ref 11.5–15.5)
WBC: 8.6 10*3/uL (ref 4.0–10.5)

## 2015-02-01 LAB — COMPREHENSIVE METABOLIC PANEL
ALK PHOS: 60 U/L (ref 38–126)
ALT: 15 U/L — ABNORMAL LOW (ref 17–63)
ANION GAP: 6 (ref 5–15)
AST: 18 U/L (ref 15–41)
Albumin: 2.5 g/dL — ABNORMAL LOW (ref 3.5–5.0)
BILIRUBIN TOTAL: 0.6 mg/dL (ref 0.3–1.2)
BUN: 19 mg/dL (ref 6–20)
CALCIUM: 9.2 mg/dL (ref 8.9–10.3)
CHLORIDE: 92 mmol/L — AB (ref 101–111)
CO2: 36 mmol/L — ABNORMAL HIGH (ref 22–32)
Creatinine, Ser: 0.91 mg/dL (ref 0.61–1.24)
GFR calc Af Amer: >60 mL/min (ref >60)
GFR calc non Af Amer: >60 mL/min (ref >60)
Glucose, Bld: 291 mg/dL — ABNORMAL HIGH (ref 65–99)
POTASSIUM: 5.1 mmol/L (ref 3.5–5.1)
Sodium: 134 mmol/L — ABNORMAL LOW (ref 135–145)
Total Protein: 6.2 g/dL — ABNORMAL LOW (ref 6.5–8.1)

## 2015-02-01 LAB — GLUCOSE, CAPILLARY
Glucose-Capillary: 310 mg/dL — ABNORMAL HIGH (ref 65–99)
Glucose-Capillary: 230 mg/dL — ABNORMAL HIGH (ref 65–99)

## 2015-02-01 LAB — PROTIME-INR
INR: 2.28
PROTHROMBIN TIME: 25.3 s — AB (ref 11.4–15.0)

## 2015-02-01 LAB — SEDIMENTATION RATE: SED RATE: 74 mm/h — AB (ref 0–16)

## 2015-02-01 MED ORDER — PANTOPRAZOLE SODIUM 40 MG PO TBEC
40.0000 mg | DELAYED_RELEASE_TABLET | Freq: Every day | ORAL | Status: DC
  Administered 2015-02-02 – 2015-02-03 (×2): 40 mg via ORAL
  Filled 2015-02-01 (×2): qty 1

## 2015-02-01 MED ORDER — BISACODYL 10 MG RE SUPP: 10.0000 mg | Freq: Every day | RECTAL | Status: AC | PRN

## 2015-02-01 MED ORDER — ALBUTEROL SULFATE (2.5 MG/3ML) 0.083% IN NEBU: 2.5000 mg | INHALATION_SOLUTION | RESPIRATORY_TRACT | Status: DC

## 2015-02-01 MED ORDER — CETYLPYRIDINIUM CHLORIDE 0.05 % MT LIQD
7.0000 mL | Freq: Two times a day (BID) | OROMUCOSAL | Status: DC
  Administered 2015-02-01 – 2015-02-08 (×11): 7 mL via OROMUCOSAL

## 2015-02-01 MED ORDER — PHYTONADIONE 5 MG PO TABS
5.0000 mg | ORAL_TABLET | Freq: Once | ORAL | Status: AC
  Administered 2015-02-01: 5 mg via ORAL
  Filled 2015-02-01: qty 1

## 2015-02-01 MED ORDER — ENOXAPARIN SODIUM 100 MG/ML ~~LOC~~ SOLN: 1.0000 mg/kg | Freq: Two times a day (BID) | SUBCUTANEOUS | Status: DC

## 2015-02-01 MED ORDER — TIOTROPIUM BROMIDE MONOHYDRATE 18 MCG IN CAPS
18.0000 ug | ORAL_CAPSULE | Freq: Every day | RESPIRATORY_TRACT | Status: DC
  Administered 2015-02-02 – 2015-02-08 (×5): 18 ug via RESPIRATORY_TRACT
  Filled 2015-02-01: qty 5

## 2015-02-01 MED ORDER — DOCUSATE SODIUM 100 MG PO CAPS
100.0000 mg | ORAL_CAPSULE | Freq: Two times a day (BID) | ORAL | Status: DC
  Administered 2015-02-01 – 2015-02-03 (×4): 100 mg via ORAL
  Filled 2015-02-01 (×4): qty 1

## 2015-02-01 MED ORDER — INSULIN ASPART 100 UNIT/ML ~~LOC~~ SOLN
0.0000 [IU] | Freq: Every day | SUBCUTANEOUS | Status: DC
  Administered 2015-02-01 – 2015-02-07 (×3): 2 [IU] via SUBCUTANEOUS

## 2015-02-01 MED ORDER — ONDANSETRON HCL 4 MG PO TABS: 4.0000 mg | ORAL_TABLET | Freq: Four times a day (QID) | ORAL | Status: DC | PRN

## 2015-02-01 MED ORDER — INSULIN ASPART 100 UNIT/ML ~~LOC~~ SOLN
0.0000 [IU] | Freq: Three times a day (TID) | SUBCUTANEOUS | Status: DC
Start: 2015-02-01 — End: 2015-02-08
  Administered 2015-02-02 (×2): 4 [IU] via SUBCUTANEOUS
  Administered 2015-02-02: 3 [IU] via SUBCUTANEOUS
  Administered 2015-02-03: 11 [IU] via SUBCUTANEOUS
  Administered 2015-02-03 – 2015-02-04 (×3): 3 [IU] via SUBCUTANEOUS
  Administered 2015-02-05: 7 [IU] via SUBCUTANEOUS
  Administered 2015-02-05 – 2015-02-06 (×2): 3 [IU] via SUBCUTANEOUS
  Administered 2015-02-07: 11 [IU] via SUBCUTANEOUS
  Administered 2015-02-08: 7 [IU] via SUBCUTANEOUS
  Administered 2015-02-08: 4 [IU] via SUBCUTANEOUS
  Filled 2015-02-01: qty 1

## 2015-02-01 MED ORDER — ACETAMINOPHEN 325 MG PO TABS
650.0000 mg | ORAL_TABLET | Freq: Four times a day (QID) | ORAL | Status: DC | PRN
  Administered 2015-02-01: 650 mg via ORAL
  Filled 2015-02-01: qty 2

## 2015-02-01 MED ORDER — AMLODIPINE BESYLATE 5 MG PO TABS
5.0000 mg | ORAL_TABLET | Freq: Every day | ORAL | Status: DC
  Administered 2015-02-01 – 2015-02-02 (×2): 5 mg via ORAL
  Filled 2015-02-01 (×2): qty 1

## 2015-02-01 MED ORDER — DEXTROSE 5 % IV SOLN
1.0000 g | INTRAVENOUS | Status: DC
  Filled 2015-02-01: qty 10

## 2015-02-01 MED ORDER — LOSARTAN POTASSIUM 50 MG PO TABS
100.0000 mg | ORAL_TABLET | Freq: Every day | ORAL | Status: DC
  Administered 2015-02-01: 100 mg via ORAL
  Filled 2015-02-01: qty 2

## 2015-02-01 MED ORDER — OXYCODONE-ACETAMINOPHEN 5-325 MG PO TABS
1.0000 | ORAL_TABLET | Freq: Four times a day (QID) | ORAL | Status: DC | PRN
  Administered 2015-02-01 – 2015-02-02 (×3): 1 via ORAL
  Filled 2015-02-01 (×3): qty 1

## 2015-02-01 MED ORDER — BISOPROLOL FUMARATE 10 MG PO TABS
10.0000 mg | ORAL_TABLET | Freq: Every day | ORAL | Status: DC
  Administered 2015-02-01 – 2015-02-02 (×2): 10 mg via ORAL
  Filled 2015-02-01 (×4): qty 1

## 2015-02-01 MED ORDER — DOCUSATE SODIUM 100 MG PO CAPS: 100.0000 mg | ORAL_CAPSULE | Freq: Two times a day (BID) | ORAL | Status: DC

## 2015-02-01 MED ORDER — BISACODYL 10 MG RE SUPP: 10.0000 mg | Freq: Every day | RECTAL | Status: DC | PRN

## 2015-02-01 MED ORDER — CARBIDOPA-LEVODOPA 25-100 MG PO TABS
2.0000 | ORAL_TABLET | Freq: Three times a day (TID) | ORAL | Status: DC
  Administered 2015-02-01 – 2015-02-02 (×2): 2 via ORAL
  Administered 2015-02-02: 1 via ORAL
  Administered 2015-02-02 – 2015-02-06 (×4): 2 via ORAL
  Filled 2015-02-01 (×9): qty 2

## 2015-02-01 MED ORDER — MOMETASONE FURO-FORMOTEROL FUM 200-5 MCG/ACT IN AERO: 2.0000 | INHALATION_SPRAY | Freq: Two times a day (BID) | RESPIRATORY_TRACT | Status: DC

## 2015-02-01 MED ORDER — BUDESONIDE 0.25 MG/2ML IN SUSP
0.2500 mg | Freq: Two times a day (BID) | RESPIRATORY_TRACT | Status: DC
  Administered 2015-02-01 – 2015-02-07 (×11): 0.25 mg via RESPIRATORY_TRACT
  Filled 2015-02-01 (×13): qty 2

## 2015-02-01 MED ORDER — OXYCODONE-ACETAMINOPHEN 5-325 MG PO TABS
1.0000 | ORAL_TABLET | Freq: Four times a day (QID) | ORAL | Status: DC | PRN
Start: 2015-02-01 — End: 2015-11-29

## 2015-02-01 MED ORDER — INSULIN ASPART 100 UNIT/ML ~~LOC~~ SOLN: 0.0000 [IU] | Freq: Three times a day (TID) | SUBCUTANEOUS | Status: DC

## 2015-02-01 MED ORDER — TIOTROPIUM BROMIDE MONOHYDRATE 18 MCG IN CAPS: 18.0000 ug | ORAL_CAPSULE | Freq: Every day | RESPIRATORY_TRACT | Status: DC

## 2015-02-01 MED ORDER — DEXTROSE 5 % IV SOLN
1.0000 g | INTRAVENOUS | Status: DC
  Administered 2015-02-01: 1 g via INTRAVENOUS
  Filled 2015-02-01 (×2): qty 10

## 2015-02-01 MED ORDER — PREDNISONE 20 MG PO TABS
20.0000 mg | ORAL_TABLET | Freq: Every day | ORAL | Status: DC
  Administered 2015-02-02: 20 mg via ORAL
  Filled 2015-02-01 (×2): qty 1

## 2015-02-01 MED ORDER — TAMSULOSIN HCL 0.4 MG PO CAPS
0.4000 mg | ORAL_CAPSULE | Freq: Every day | ORAL | Status: DC
  Administered 2015-02-02 – 2015-02-08 (×4): 0.4 mg via ORAL
  Filled 2015-02-01 (×4): qty 1

## 2015-02-01 MED ORDER — DEXTROSE 5 % IV SOLN: 1.0000 g | INTRAVENOUS | Status: DC

## 2015-02-01 MED ORDER — LOSARTAN POTASSIUM 50 MG PO TABS
100.0000 mg | ORAL_TABLET | Freq: Every day | ORAL | Status: DC
  Administered 2015-02-02: 100 mg via ORAL
  Filled 2015-02-01 (×2): qty 2

## 2015-02-01 MED ORDER — ALUM & MAG HYDROXIDE-SIMETH 200-200-20 MG/5ML PO SUSP: 30.0000 mL | Freq: Four times a day (QID) | ORAL | Status: DC | PRN

## 2015-02-01 MED ORDER — ALBUTEROL SULFATE (2.5 MG/3ML) 0.083% IN NEBU
2.5000 mg | INHALATION_SOLUTION | RESPIRATORY_TRACT | Status: DC
  Administered 2015-02-01 – 2015-02-02 (×2): 2.5 mg via RESPIRATORY_TRACT
  Filled 2015-02-01 (×2): qty 3

## 2015-02-01 MED ORDER — PREDNISONE 20 MG PO TABS
40.0000 mg | ORAL_TABLET | Freq: Every day | ORAL | Status: DC
  Administered 2015-02-01: 40 mg via ORAL
  Filled 2015-02-01: qty 2

## 2015-02-01 MED ORDER — CLONAZEPAM 0.5 MG PO TABS
0.5000 mg | ORAL_TABLET | Freq: Three times a day (TID) | ORAL | Status: DC | PRN
  Administered 2015-02-01 – 2015-02-03 (×4): 0.5 mg via ORAL
  Filled 2015-02-01 (×4): qty 1

## 2015-02-01 MED ORDER — LAMOTRIGINE 25 MG PO TABS
25.0000 mg | ORAL_TABLET | Freq: Two times a day (BID) | ORAL | Status: DC
  Administered 2015-02-01 – 2015-02-06 (×5): 25 mg via ORAL
  Filled 2015-02-01 (×11): qty 1

## 2015-02-01 MED ORDER — ONDANSETRON HCL 4 MG/2ML IJ SOLN: 4.0000 mg | Freq: Four times a day (QID) | INTRAMUSCULAR | Status: DC | PRN

## 2015-02-01 MED ORDER — ISOSORBIDE MONONITRATE ER 30 MG PO TB24
30.0000 mg | ORAL_TABLET | Freq: Every day | ORAL | Status: DC
  Administered 2015-02-02: 30 mg via ORAL
  Filled 2015-02-01 (×2): qty 1

## 2015-02-01 MED ORDER — THIAMINE HCL 100 MG/ML IJ SOLN
100.0000 mg | Freq: Every day | INTRAMUSCULAR | Status: DC
  Administered 2015-02-01: 100 mg via INTRAVENOUS
  Filled 2015-02-01: qty 2

## 2015-02-01 MED ORDER — LEVOTHYROXINE SODIUM 137 MCG PO TABS
137.0000 ug | ORAL_TABLET | Freq: Every day | ORAL | Status: DC
  Administered 2015-02-02 – 2015-02-03 (×2): 137 ug via ORAL
  Filled 2015-02-01 (×4): qty 1

## 2015-02-01 MED ORDER — AMLODIPINE BESYLATE 5 MG PO TABS: 5.0000 mg | ORAL_TABLET | Freq: Every day | ORAL | Status: DC

## 2015-02-01 MED ORDER — PAROXETINE HCL 20 MG PO TABS
60.0000 mg | ORAL_TABLET | Freq: Every day | ORAL | Status: DC
Start: 2015-02-02 — End: 2015-02-07
  Administered 2015-02-02 – 2015-02-03 (×2): 60 mg via ORAL
  Filled 2015-02-01 (×3): qty 3

## 2015-02-01 MED ORDER — PREDNISONE 20 MG PO TABS: 20.0000 mg | ORAL_TABLET | Freq: Every day | ORAL | Status: DC

## 2015-02-01 MED ORDER — FOLIC ACID 5 MG/ML IJ SOLN
1.0000 mg | Freq: Every day | INTRAMUSCULAR | Status: DC
  Administered 2015-02-01: 1 mg via INTRAVENOUS
  Filled 2015-02-01 (×2): qty 0.2

## 2015-02-01 MED ORDER — ACETAMINOPHEN 650 MG RE SUPP: 650.0000 mg | Freq: Four times a day (QID) | RECTAL | Status: DC | PRN

## 2015-02-01 MED ORDER — GADOBENATE DIMEGLUMINE 529 MG/ML IV SOLN
20.0000 mL | Freq: Once | INTRAVENOUS | Status: AC | PRN
  Administered 2015-02-01: 19 mL via INTRAVENOUS

## 2015-02-01 MED ORDER — MOMETASONE FURO-FORMOTEROL FUM 200-5 MCG/ACT IN AERO
2.0000 | INHALATION_SPRAY | Freq: Two times a day (BID) | RESPIRATORY_TRACT | Status: DC
  Administered 2015-02-02 – 2015-02-08 (×11): 2 via RESPIRATORY_TRACT
  Filled 2015-02-01: qty 8.8

## 2015-02-01 MED ORDER — ENOXAPARIN SODIUM 150 MG/ML ~~LOC~~ SOLN: 1.0000 mg/kg | Freq: Two times a day (BID) | SUBCUTANEOUS | Status: DC

## 2015-02-01 MED ORDER — IPRATROPIUM-ALBUTEROL 0.5-2.5 (3) MG/3ML IN SOLN: 3.0000 mL | Freq: Four times a day (QID) | RESPIRATORY_TRACT | Status: DC | PRN

## 2015-02-01 MED ORDER — SODIUM CHLORIDE 0.9 % IJ SOLN
3.0000 mL | Freq: Two times a day (BID) | INTRAMUSCULAR | Status: DC
  Administered 2015-02-01 – 2015-02-04 (×6): 3 mL via INTRAVENOUS

## 2015-02-01 MED ORDER — TIZANIDINE HCL 4 MG PO TABS
4.0000 mg | ORAL_TABLET | Freq: Three times a day (TID) | ORAL | Status: DC | PRN
  Administered 2015-02-01 – 2015-02-03 (×5): 4 mg via ORAL
  Filled 2015-02-01 (×6): qty 1

## 2015-02-01 MED ORDER — CEFTRIAXONE SODIUM IN DEXTROSE 20 MG/ML IV SOLN: 1.0000 g | INTRAVENOUS | Status: DC

## 2015-02-01 MED ORDER — ATORVASTATIN CALCIUM 40 MG PO TABS
40.0000 mg | ORAL_TABLET | Freq: Every day | ORAL | Status: DC
  Administered 2015-02-01 – 2015-02-02 (×2): 40 mg via ORAL
  Filled 2015-02-01 (×2): qty 1

## 2015-02-01 NOTE — Care Management (Signed)
Informed Advanced of transfer.

## 2015-02-01 NOTE — Progress Notes (Signed)
Pt discharged to [facility] via [facility].

## 2015-02-01 NOTE — Progress Notes (Signed)
Patient ID: John Mendoza, male   DOB: September 12, 1945, 69 y.o.   MRN: [id number]  MRI reviewed directly with radiology and discussed with neuroradiology and orthopedics.  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  No neurologic compromise is noted on exam, but concerning that progression of abscess combined with his DDD puts him at higher risk for spinal cord impingement.  Infectious disease and neurosurgery not available at this facility.  Abx changed to rocephin (based on group B strep bacteremia).  Recommendation after discussion is to pursue transfer to a facility with neurosurgery capability and ID expertise while pursuing continued medical management.

## 2015-02-01 NOTE — Progress Notes (Signed)
Attempt to call Mrs. Vita, no answer, left voice message and call back number.

## 2015-02-01 NOTE — Discharge Summary (Signed)
Physician Discharge Summary  Patient ID: John Mendoza MRN: [id number] DOB/AGE: 1945-12-21 69 y.o.  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  Pt has h/o recurrent aspiration due to neurologic deficits from prior head and neck cancer treatment as well as forward positioning of the esophagus due to severe cervical DDD.  He was placed on vancomycin, zosyn, and zithromax initially, as well as steroids and inhaled medications for COPD exacerbation.  Initially placed on BiPap and moved to CCU.  He improved rapidly from a respiratory standpoint and was weaned to his home rate of 2L [place].  Blood cultures were positive for group B strep.  Sputum culture with candida, not albicans, with a few GNR.  He was changed to zosyn, and then converted to cefuroxime PO.  He had no fevers and WBC remained normal.  He was evaluated by Pulmonology, with medications adjusted for COPD  His BP remained labile, which has been chronically a challenge due to orthostasis, likely from parkinson's.  Medications were adjusted to help control this, but BP tends to be very positional.  Glucose was reasonably controlled, and he was covered with SSI.  No evidence of angina was noted during this hospitalization.  He was last treated for MPA/ANCA positive hemoptysis at [facility] in 4/16.  He had no hemoptysis at this time,  with no evidence of kidney involvement (no kidney symptoms at any point when he was diagnosed or treated for this earlier this year either).  Records from [facility] available through [facility].     He c/o back pain on arrival, which initially improved, however had worsening of this over the last 24 hrs; pain increased with movement and certain positions.  Neurologically he remained intact.  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  This was reviewed with neuroradiology and orthopedics, who felt patient needed transfer to a facility with neurosurgery and ID capability for further guidance and potential treatment if symptoms progressed.  He will be transferred in stable condition; coumadin and ASA have been held, with change to lovenox in the event he requires aspiration or surgical intervention.  His diet should be mechanical soft with nectar thick liquids. He will be on bedrest until evaluated by neurosurgery.    Disposition: Transfer to [facility]/medical service     Medication List    STOP taking these medications        aspirin EC 81 MG tablet     HYDROcodone-acetaminophen 5-325 MG per tablet  Commonly known as:  NORCO/VICODIN     metFORMIN 500 MG tablet  Commonly known as:  GLUCOPHAGE     Sennosides 25 MG Tabs     warfarin 4 MG tablet  Commonly known as:  COUMADIN      TAKE these medications        albuterol (2.5  MG/3ML) 0.083% nebulizer solution  Commonly known as:  PROVENTIL  Take 3 mLs (2.5 mg total) by nebulization every 4 (four) hours.     amLODipine 5 MG tablet  Commonly known as:  NORVASC  Take 5 mg by mouth daily.     atorvastatin 40 MG tablet  Commonly known as:  LIPITOR  Take 40 mg by mouth at bedtime.     bisacodyl 10 MG suppository  Commonly known as:  DULCOLAX  Place 1 suppository (10 mg total) rectally daily as  needed for moderate constipation.     bisoprolol 10 MG tablet  Commonly known as:  ZEBETA  Take 10 mg by mouth daily.     budesonide 0.25 MG/2ML nebulizer solution  Commonly known as:  PULMICORT  Take 2 mLs (0.25 mg total) by nebulization as needed.     carbidopa-levodopa 25-100 MG per tablet  Commonly known as:  SINEMET IR  Take 2 tablets by mouth 3 (three) times daily.     cefTRIAXone 1 g in dextrose 5 % 50 mL  Inject 1 g into the vein daily.     clonazePAM 1 MG tablet  Commonly known as:  KLONOPIN  Take 0.5 mg by mouth 3 (three) times daily as needed for anxiety.     docusate sodium 100 MG capsule  Commonly known as:  COLACE  Take 1 capsule (100 mg total) by mouth 2 (two) times daily.     enoxaparin 150 MG/ML injection  Commonly known as:  LOVENOX  Inject 0.61 mLs (90 mg total) into the skin every 12 (twelve) hours.  Start taking on:  02/02/2015     ipratropium-albuterol 0.5-2.5 (3) MG/3ML Soln  Commonly known as:  DUONEB  Take 3 mLs by nebulization every 6 (six) hours as needed (for shortness of breath).     isosorbide mononitrate 30 MG 24 hr tablet  Commonly known as:  IMDUR  Take 30 mg by mouth daily.     lamoTRIgine 25 MG tablet  Commonly known as:  LAMICTAL  Take 25 mg by mouth 2 (two) times daily.     levothyroxine 137 MCG tablet  Commonly known as:  SYNTHROID, LEVOTHROID  Take 137 mcg by mouth daily.     losartan 100 MG tablet  Commonly known as:  COZAAR  Take 100 mg by mouth daily.     mometasone-formoterol 200-5 MCG/ACT Aero  Commonly known as:  DULERA  Inhale 2 puffs into the lungs 2 (two) times daily.     omeprazole 20 MG capsule  Commonly known as:  PRILOSEC  Take 40 mg by mouth 2 (two) times daily.     oxyCODONE-acetaminophen 5-325 MG per tablet  Commonly known as:  PERCOCET/ROXICET  Take 1 tablet by mouth every 6 (six) hours as needed for severe pain.     PARoxetine 20 MG tablet  Commonly known as:  PAXIL  Take 60 mg by mouth daily.      predniSONE 20 MG tablet  Commonly known as:  DELTASONE  Take 1 tablet (20 mg total) by mouth daily with breakfast.     tamsulosin 0.4 MG Caps capsule  Commonly known as:  FLOMAX  Take 0.4 mg by mouth daily.     tiotropium 18 MCG inhalation capsule  Commonly known as:  SPIRIVA  Place 1 capsule (18 mcg total) into inhaler and inhale daily.     tiZANidine 4 MG tablet  Commonly known as:  ZANAFLEX  Take 4 mg by mouth every 8 (eight)  hours as needed for muscle spasms.         Signed: Tama High III 02/01/2015, 2:42 PM

## 2015-02-01 NOTE — H&P (Signed)
[facility] History and Physical                                                                                    John Mendoza, is a 69 y.o. male  MRN: [id number]   DOB - 1946-02-28  Admit Date - 02/01/2015  Outpatient Primary MD for the patient is John Odetta Pink, MD  Referring MD: John Mendoza / [facility]  Consulting M.D: John Mendoza / ID and Interventional Radiology  With History of -  Past Medical History  Diagnosis Date  . Barrett esophagus     "pretty bad" (02/01/2015)  . Hypertension   . Hyperlipidemia   . Multiple pulmonary nodules   . CHF (congestive heart failure)   . Lumbar spinal stenosis   . COPD (chronic obstructive pulmonary disease)   . CAD (coronary artery disease)   . On home oxygen therapy     "2L unless I'm outside" (02/01/2015)  . DVT (deep venous thrombosis) 11/2013; 02/01/2015    John Mendoza 01/18/2014; "got one behind my right knee now" (02/01/2015)  . Parkinson's disease     /notes 08/15/2014  . Recurrent aspiration pneumonia   . Sleep apnea     "gave machine back; couldn't use it" (02/01/2015)  . Hypothyroidism   . Type II diabetes mellitus   . Iron deficiency anemia     "real bad" (02/01/2015)  . GERD (gastroesophageal reflux disease)   . Seizures     "don't know what they are from; last one was ~ 4 months ago" (02/01/2015)  . DJD (degenerative joint disease)     John Mendoza 08/15/2014  . Arthritis     "real bad in my back" (02/01/2015)  . Chronic lower back pain   . History of gout   . Depression     "maybe a touch before I started taking seizure RX" (02/01/2015)  . Anxiety   . Basal cell carcinoma     "all over my face and body"   . Throat cancer     John Mendoza 08/15/2014  . Heart attack 03/20/1995      Past Surgical History  Procedure Laterality Date  . Anterior cervical decomp/discectomy fusion  X 2  . Radical neck dissection Right ~ 1998    "throat cancer"  . Total knee arthroplasty Right 2011  . Thyroidectomy  ~ 1996 X 2    "took  1/2 out at a time"  . Cataract extraction w/ intraocular lens  implant, bilateral Bilateral   . Tonsillectomy    . Excisional hemorrhoidectomy    . Joint replacement    . Back surgery    . Posterior fusion cervical spine  X 1  . Coronary angioplasty with stent placement      "I've got at least 3" (02/01/2015)  . Coronary artery bypass graft  1996    "CABG X3"  . Basal cell carcinoma excision      "all over my face and body"     in for   No chief complaint on file.    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 Patient was recently admitted to [facility] on 7/22 secondary to sepsis from aspiration pneumonitis and later found to have group B streptococcal bacteremia. When he was initially admitted he had been having back pain that improved after admission but 24 hours prior to transfer to this facility he began having increasing back pain especially with movement and certain positions. He did not have any neurological deficits noted. He was evaluated by orthopedic service at the other facility; MRI of the lumbar spine without contrast revealed L4-L5 discitis and vertebral osteomyelitis with epidural abscess. The films were reviewed by neuroradiology in orthopedics who felt the patient needed to transfer to a facility with neurosurgeon ID capability for further guidance of potential treatment of symptoms progressed. Patient was accepted by the hospitalist service.  After arrival to our facility patient was afebrile, BP was 161/84, pulse 79 respirations 17. He was stable on 3 L nasal cannula oxygen with sats 96%. He remained neurologically intact with  controlled lumbar pain. Patient's INR was noted to be 2.28 today so his warfarin has been held and he's been given 1 dose of vitamin K by mouth. Interventional radiology as well as infectious disease service has been consulted. His Rocephin has been continued.   Review of Systems   In addition to the HPI above,  No Headache, changes with Vision or hearing, new weakness, tingling, numbness in any extremity, No indigestion/reflux No Chest pain, palpitations, orthopnea or DOE No Abdominal pain, N/V; no melena or hematochezia, no dark tarry stools, Bowel movements are regular, No dysuria, hematuria or flank pain No new skin rashes, lesions, masses or bruises, No recent weight gain or loss No polyuria, polydypsia or polyphagia,  *A full 10 point Review of Systems was done, except as stated above, all other Review of Systems were negative.  Social History History  Substance Use Topics  . Smoking status: Former Smoker -- 2.00 packs/day for 35 years    Types: Cigarettes    Quit date: 03/20/1995  . Smokeless tobacco: Never Used  . Alcohol Use: Yes     Comment: 02/01/2015 "stopped drinking in ~ 1996; never had problem w/it"    Resides at: Private residence  Lives with: Wife  Ambulatory status: With cane prior to admission   Family History Family History  Problem Relation Age of Onset  . Leukemia Paternal Grandmother   . Cancer Maternal Grandmother     stomach  . Cancer Paternal Aunt   . Other Father     bacterial endocarditis  . Rheum arthritis Father      Prior to Admission medications   Medication Sig Start Date End Date Taking? Authorizing Provider  albuterol (PROVENTIL) (2.5 MG/3ML) 0.083% nebulizer solution Take 3 mLs (2.5 mg total) by nebulization every 4 (four) hours. 02/01/15   Tama High III, MD  amLODipine (NORVASC) 5 MG tablet Take 5 mg by mouth daily.    Historical Provider, MD  atorvastatin (LIPITOR) 40 MG tablet Take 40 mg by mouth at bedtime.    Historical  Provider, MD  bisacodyl (DULCOLAX) 10 MG suppository Place 1 suppository (10 mg total) rectally daily as needed for moderate constipation. 02/01/15   Tama High III, MD  bisoprolol (ZEBETA) 10 MG tablet Take 10 mg by mouth daily.    Historical Provider,  MD  budesonide (PULMICORT) 0.25 MG/2ML nebulizer solution Take 2 mLs (0.25 mg total) by nebulization as needed. Patient not taking: Reported on 07/12/2014 09/02/13   Juanito Doom, MD  carbidopa-levodopa (SINEMET IR) 25-100 MG per tablet Take 2 tablets by mouth 3 (three) times daily.     Historical Provider, MD  cefTRIAXone 1 g in dextrose 5 % 50 mL Inject 1 g into the vein daily. 02/01/15   Tama High III, MD  clonazePAM (KLONOPIN) 1 MG tablet Take 0.5 mg by mouth 3 (three) times daily as needed for anxiety.    Historical Provider, MD  docusate sodium (COLACE) 100 MG capsule Take 1 capsule (100 mg total) by mouth 2 (two) times daily. 02/01/15   Tama High III, MD  enoxaparin (LOVENOX) 150 MG/ML injection Inject 0.61 mLs (90 mg total) into the skin every 12 (twelve) hours. 02/02/15   Tama High III, MD  ipratropium-albuterol (DUONEB) 0.5-2.5 (3) MG/3ML SOLN Take 3 mLs by nebulization every 6 (six) hours as needed (for shortness of breath).     Juanito Doom, MD  isosorbide mononitrate (IMDUR) 30 MG 24 hr tablet Take 30 mg by mouth daily.    Historical Provider, MD  lamoTRIgine (LAMICTAL) 25 MG tablet Take 25 mg by mouth 2 (two) times daily.    Historical Provider, MD  levothyroxine (SYNTHROID, LEVOTHROID) 137 MCG tablet Take 137 mcg by mouth daily.    Historical Provider, MD  losartan (COZAAR) 100 MG tablet Take 100 mg by mouth daily.    Historical Provider, MD  mometasone-formoterol (DULERA) 200-5 MCG/ACT AERO Inhale 2 puffs into the lungs 2 (two) times daily. 02/01/15   Tama High III, MD  omeprazole (PRILOSEC) 20 MG capsule Take 40 mg by mouth 2 (two) times daily.    Historical Provider, MD  oxyCODONE-acetaminophen (PERCOCET/ROXICET)  5-325 MG per tablet Take 1 tablet by mouth every 6 (six) hours as needed for severe pain. 02/01/15   Tama High III, MD  PARoxetine (PAXIL) 20 MG tablet Take 60 mg by mouth daily.     Historical Provider, MD  predniSONE (DELTASONE) 20 MG tablet Take 1 tablet (20 mg total) by mouth daily with breakfast. 02/01/15   Tama High III, MD  tamsulosin (FLOMAX) 0.4 MG CAPS Take 0.4 mg by mouth daily.     Historical Provider, MD  tiotropium (SPIRIVA) 18 MCG inhalation capsule Place 1 capsule (18 mcg total) into inhaler and inhale daily. 02/01/15   Tama High III, MD  tiZANidine (ZANAFLEX) 4 MG tablet Take 4 mg by mouth every 8 (eight) hours as needed for muscle spasms.    Historical Provider, MD    Allergies  Allergen Reactions  . Other Other (See Comments)    Pt states that inhaled medications make him depressed and have negative thoughts.      Physical Exam  Vitals  There were no vitals taken for this visit.   General:  In no acute distress, appears chronically ill  Psych:  Normal affect, Denies Suicidal or Homicidal ideations, Awake Alert, Oriented X 3. Speech and thought patterns are clear and appropriate, no apparent short term memory deficits  Neuro:   No focal neurological deficits, CN II through XII intact, Strength 5/5 all 4 extremities, Sensation intact all 4 extremities.  ENT:  Ears and Eyes appear Normal, Conjunctivae clear, PER. Moist oral mucosa without erythema or exudates.  Neck:  Supple, No lymphadenopathy appreciated  Respiratory:  Symmetrical chest wall movement, Good  air movement bilaterally, CTAB. 3 L oxygen  Cardiac:  RRR, No Murmurs, no LE edema noted, no JVD, No carotid bruits, peripheral pulses palpable at 2+  Abdomen:  Positive bowel sounds, Soft, Non tender, Non distended,  No masses appreciated, no obvious hepatosplenomegaly  Skin:  No Cyanosis, Normal Skin Turgor, No Skin Rash or Bruise.  Extremities: Symmetrical without obvious trauma or injury,  no  effusions.  Musculoskeletal: Patient tender lower lumbar spine with minimal palpation  Data Review  CBC  Recent Labs Mendoza 01/26/15 1946 01/28/15 0454 01/29/15 0506 01/30/15 0410  WBC 13.9* 10.1 11.3* 10.1  HGB 10.8* 9.6* 9.4* 9.2*  HCT 34.2* 30.5* 30.4* 29.7*  PLT 182 171 201 204  MCV 74.8* 76.0* 76.0* 75.4*  MCH 23.6* 23.8* 23.5* 23.5*  MCHC 31.6* 31.3* 30.9* 31.2*  RDW 18.8* 18.9* 18.9* 18.7*  LYMPHSABS 0.2*  --   --   --   MONOABS 0.3  --   --   --   EOSABS 0.0  --   --   --   BASOSABS 0.0  --   --   --     Chemistries   Recent Labs Mendoza 01/26/15 1946 01/28/15 0454 01/29/15 0506 01/30/15 0410 01/31/15 0334  NA 133* 136 138 137 139  K 3.8 4.4 4.3 3.9 4.2  CL 97* 102 101 99* 96*  CO2 26 29 30  32 36*  GLUCOSE 184* 196* 131* 164* 149*  BUN 16 16 16 14 13   CREATININE 0.85 0.79 0.64 0.68 0.67  CALCIUM 8.9 8.5* 8.7* 8.8* 9.0  AST 16 22  --   --   --   ALT 14* <5*  --   --   --   ALKPHOS 79 66  --   --   --   BILITOT 0.8 0.4  --   --   --     estimated creatinine clearance is 96.3 mL/min (by C-G formula based on Cr of 0.67).  No results for input(s): TSH, T4TOTAL, T3FREE, THYROIDAB in the last 72 hours.  Invalid input(s): FREET3  Coagulation profile  Recent Labs Mendoza 01/28/15 0454 01/29/15 0506 01/30/15 0410 01/31/15 0334 02/01/15 0338  INR 2.79 3.71 3.10 2.57 2.28    No results for input(s): DDIMER in the last 72 hours.  Cardiac Enzymes  Recent Labs Mendoza 01/26/15 1946  TROPONINI 0.03    Invalid input(s): POCBNP  Urinalysis    Component Value Date/Time   COLORURINE YELLOW* 01/27/2015 0437   COLORURINE Yellow 01/17/2014 1102   APPEARANCEUR CLEAR* 01/27/2015 0437   APPEARANCEUR Clear 01/17/2014 1102   LABSPEC 1.020 01/27/2015 0437   LABSPEC 1.008 01/17/2014 1102   PHURINE 5.0 01/27/2015 0437   PHURINE 7.0 01/17/2014 1102   GLUCOSEU NEGATIVE 01/27/2015 0437   GLUCOSEU Negative 01/17/2014 1102   HGBUR NEGATIVE 01/27/2015 0437   HGBUR  Negative 01/17/2014 1102   BILIRUBINUR NEGATIVE 01/27/2015 0437   BILIRUBINUR Negative 01/17/2014 1102   KETONESUR TRACE* 01/27/2015 0437   KETONESUR Negative 01/17/2014 1102   PROTEINUR 30* 01/27/2015 0437   PROTEINUR Negative 01/17/2014 1102   NITRITE NEGATIVE 01/27/2015 0437   NITRITE Negative 01/17/2014 1102   LEUKOCYTESUR NEGATIVE 01/27/2015 0437   LEUKOCYTESUR Negative 01/17/2014 1102    Imaging results:   Dg Chest 2 View  01/29/2015   CLINICAL DATA:  Acute respiratory failure, aspiration pneumonia.  EXAM: CHEST  2 VIEW  COMPARISON:  January 26, 2015.  FINDINGS: Stable cardiomegaly. Stable central pulmonary vascular congestion is noted. Status post coronary  artery bypass graft. No pneumothorax is noted. Stable small linear densities are noted laterally in left lung base concerning for scarring or possibly subsegmental atelectasis. Right basilar opacity is decreased consistent with slightly improved pneumonia or atelectasis. Interval development of minimal pleural effusion is noted.  IMPRESSION: Stable cardiomegaly and central pulmonary vascular congestion is noted. Slightly decreased right basilar opacity is noted consistent with slightly improved pneumonia or atelectasis, although there is interval development of minimal right pleural effusion. Continued radiographic follow-up is recommended.   Electronically Signed   By: Marijo Conception, M.D.   On: 01/29/2015 09:58   Dg Thoracic Spine 2 View  01/28/2015   CLINICAL DATA:  Severe upper back pain.  Initial encounter.  EXAM: THORACIC SPINE - 2-3 VIEWS  COMPARISON:  CT chest 08/15/2014.  FINDINGS: There is no fracture or malalignment. Multilevel anterior endplate spurring is worst in the mid and lower thoracic spine and unchanged in appearance. There is mild convex left scoliosis with the apex at approximately L1. Six intact median sternotomy wires are again seen. The patient is status post CABG.  IMPRESSION: No acute abnormality.  No change in  the appearance of thoracic spondylosis.   Electronically Signed   By: Inge Rise M.D.   On: 01/28/2015 14:47   Dg Lumbar Spine 2-3 Views  01/28/2015   CLINICAL DATA:  Back pain  EXAM: LUMBAR SPINE - 2-3 VIEW  COMPARISON:  Lumbar spine MRI 01/20/2007  FINDINGS: 5 non rib-bearing lumbar type vertebral bodies are identified. Disc degenerative change again noted most prominent at L4-L5 and L5-S1. 2 mm anterolisthesis of L3 on L4. Vertebral body heights are preserved.  IMPRESSION: Mild lower lumbar spine disc degenerative change.   Electronically Signed   By: Conchita Paris M.D.   On: 01/28/2015 14:51   Mr Lumbar Spine W Wo Contrast  02/01/2015   CLINICAL DATA:  Diabetic patient with low back pain extending to both buttocks. Recent admission for multi lobar pneumonia. Sepsis with group B Streptococcus.  EXAM: MRI LUMBAR SPINE WITHOUT AND WITH CONTRAST  TECHNIQUE: Multiplanar and multiecho pulse sequences of the lumbar spine were obtained without and with intravenous contrast.  CONTRAST:  70mL MULTIHANCE GADOBENATE DIMEGLUMINE 529 MG/ML IV SOLN  COMPARISON:  Plain radiographs 01/28/2015 appear  unremarkable.  FINDINGS: Segmentation: Normal  Alignment: 3 mm anterolisthesis L3-4 is facet mediated. Alignment is otherwise anatomic.  Vertebrae: No worrisome osseous lesion.Abnormal edema and early endplate erosive change above and below the L4-5 interspace. This is accompanied by a T2 and STIR hyperintensity of that disc space along with a regional ventral epidural collection described below. Findings are concerning for osteomyelitis.  Conus medullaris: Normal in size, signal, and location.  Paraspinal tissues: No evidence for hydronephrosis or paravertebral mass. 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  Disc levels:  L1-L2:  Normal.  L2-L3: Central and leftward protrusion. Mild facet arthropathy. LEFT subarticular zone narrowing could affect the L3 nerve root. No significant spinal stenosis.  L3-L4:  3 mm of facet mediated anterolisthesis. Broad-based central protrusion. Moderate facet and ligamentum flavum hypertrophy. Asymmetric enhancement and joint effusion of the LEFT L3-4 facet; LEFT septic arthritis is likely. LEFT subarticular zone narrowing could affect the LEFT L4 nerve root. Marked anterior osteophytic spurring.  L4-L5: T2 hyperintense disc with central protrusion. BILATERAL facet and ligamentum flavum hypertrophy. Enhancing ventral epidural collection, up to 6 mm thickness behind the L4 vertebral body, with mass effect on the ventral thecal sac and RIGHT greater than LEFT L5 nerve roots.  BILATERAL foraminal narrowing, multifactorial, likely to affect the RIGHT greater than LEFT L4 nerve roots as well. Central nonenhancing collection likely representing purulence. Epidural abscess is suspected.  L5-S1: Disc space narrowing. Mild facet arthropathy. No impingement.  IMPRESSION: Findings consistent with L4-5 diskitis, regional osteomyelitis, and shallow ventral epidural abscess behind the L4 vertebral body.  Multifactorial neural impingement at the L4-5 level related to disc material, posterior element hypertrophy, and enhancing ventral epidural soft tissue. RIGHT greater than LEFT L4 and L5 nerve root impingement.  3 mm of facet mediated slip L3-L4 with broad-based central protrusion. Posterior element hypertrophy with asymmetric enhancement and effusion of the LEFT L3-4 facet; these findings are most consistent with septic arthritis of that joint. LEFT L4 nerve root impingement is likely.  Findings discussed with ordering provider at time of interpretation.   Electronically Signed   By: Staci Righter M.D.   On: 02/01/2015 12:36   Dg Chest Port 1 View  01/26/2015   CLINICAL DATA:  Shortness of Breath  EXAM: PORTABLE CHEST - 1 VIEW  COMPARISON:  Chest CT August 15, 2014  FINDINGS: There is airspace consolidation in portions of the right middle and lower lobes. There is mild atelectasis in left base.  Heart is enlarged with pulmonary vascularity within normal limits. No adenopathy. Patient is status post coronary artery bypass grafting.  IMPRESSION: Areas of patchy infiltrate in the right middle and lower lobes. Left base atelectasis.  Followup PA and lateral chest X radiographs recommended in 3-4 weeks following trial of antibiotic therapy to ensure resolution and exclude underlying malignancy.   Electronically Signed   By: Lowella Grip III M.D.   On: 01/26/2015 20:22     Assessment & Plan  Principal Problem:   Discitis of lumbar region /  Vertebral osteomyelitis /Epidural abscess -Admit to telemetry -Consult ID for treatment recommendations as well as duration of antibiotic therapy; discussed with Dr. Linus Mendoza and recommends continuing Rocephin for now until he is able to formally evaluate the patient -Interventional radiology consultation for possible aspiration of epidural abscess -Continue preadmission pain medications that include narcotics and muscle relaxers with patient reporting these have been very helpful -We'll eventually need formal PT and OT evaluation -Warfarin on hold  Active Problems:   Acute on chronic respiratory failure with hypoxia:   A) Aspiration pneumonitis   B) COPD  -Patient reports baseline oxygen 2 L/m -Recently admitted for aspiration pneumonitis with preadmission symptoms resolved -Continue incentive spirometry -Continue Proventil nebs as well as budesonide nebs -Continue Dulera MDI    Bacteremia due to group B Streptococcus -Initial blood cultures obtained on 7/22 and demonstrate strep agalactia essentially pansensitive -Continuing Rocephin as recommended by ID -Repeat follow-up blood cultures today      Hypertension -Current blood pressure controlled -Continue Norvasc, Zebeta, Imdur, Cozaar    CAD  -Continue beta blocker and statin as well as nitrate -Currently asymptomatic    History of DVT on Warfarin anticoagulation -In review of medical  record patient was initially diagnosed with a small popliteal DVT on 12/15/2013 -per documentation a CT angiogram of the chest was to be obtained to determine if had concomitant PE; as apparently was negative -On follow-up visit with pulmonologist August 2015 the recommendation was continue warfarin for 6 months -At follow-up visit with pulmonologist in 2016 it was documented that the patient's primary care physician recommended lifelong anticoagulation in setting of "idiopathic DVT" but pulmonologist expressed concerns that with patient's underlying Parkinson's disease that gait imbalance and therefore associative falls may  become problematic and warrant later discontinuation of Coumadin -Present time we are holding warfarin in anticipation of interventional radiology procedure -Vitamin K 5 mg by mouth 1 -Repeat PT/INR in a.m.    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 -Continue chronic prednisone 20 g daily -Stable without overt pulmonary or renal manifestations at this juncture     Dysphagia/chronic -Patient endorses onset in 1992 after surgery for throat cancer and subsequent cervical neck surgery -Unclear if underlying Parkinson's influencing -Did not undergo swallowing evaluation at previous facility and suspect will need to have formal speech therapy evaluation this admission    DVT Prophylaxis: SCDs-INR therapeutic but we are reversing in anticipation of invasive procedure  Family Communication: No family at bedside    Code Status:  Full code  Condition:  Stable  Discharge disposition: Anticipate potential discharge to home pending treatment of an resolution of epidural abscess with associated lumbar discitis and osteomyelitis;  also tending PT and OT evaluation in the event patient will need rehabilitative therapy before returning to home  Time spent in minutes : 60      Laker Thompson L. ANP on 02/01/2015 at 5:40 PM  Between 7am to 7pm - Pager - [phone]  After 7pm go to [email or web address] - password [email or web address]  And look for the night coverage person covering me after hours  [facility]

## 2015-02-01 NOTE — Progress Notes (Signed)
Attempt to call report to RN admitting Mr. John Mendoza into rm 3 [facility]. RN was busy at the time with a new admission and will call back when available. [facility] is here at the bedside and patient is ready for transport. Pt. assessment unchanged from this morning; IV Rocephin infusing, new sacral dsg in place. Percocet given early per Dr. [name] to make transport less painful for patient.  Casimiro Lienhard, Caryn Bee RN BSN

## 2015-02-01 NOTE — Consult Note (Signed)
ORTHOPAEDIC CONSULTATION  REQUESTING PHYSICIAN: Adin Hector, MD  Chief Complaint:   Low back pain radiating to both buttocks and lateral hips.  History of Present Illness: John Mendoza is a 69 y.o. male with multiple medical problems including insulin-dependent diabetes, hypertension, coronary artery disease, and early Parkinson's disease who was admitted 6 days ago for bilateral multi-lobular pneumonia. Over the past several days, he has noted worsening lower back pain radiating to both buttocks. He denies any specific injury that may precipitated the symptoms. He denies any radiation of pain down either lower extremity and denies any numbness or paresthesias to either lower extremity. He also denies any bowel or bladder complaints. The patient does have a history of long-standing lower back issues. In fact, he saw Dr. Lonn Georgia several years ago in consultation. He was told that he had significant disease of the lower lumbar spine and would benefit from surgical decompression, but he was not a surgical candidate due to his multiple medical issues. The patient states that he also saw Dr. Sharyne Richters, another spine surgeon in [place], who told him the same thing.  Past Medical History  Diagnosis Date  . Barrett esophagus   . Hypertension   . Hyperlipidemia   . Depression   . Multiple pulmonary nodules   . CHF (congestive heart failure)   . Lumbar spinal stenosis   . S/P CABG (coronary artery bypass graft)   . COPD (chronic obstructive pulmonary disease)   . CAD (coronary artery disease)   . Basal cell carcinoma   . Diabetes mellitus   . Cancer of neck   . Heart attack    Past Surgical History  Procedure Laterality Date  . Bypass graft    . Cervical fusion    . Radial neck dissection    . Multiple stents    . Total knee arthroplasty  2011  . Thyroidectomy  1996   History   Social History  . Marital Status: Married     Spouse Name: N/A  . Number of Children: 2  . Years of Education: N/A   Occupational History  . Retired     has worked in [employer] in the past, exposed to lint.    Social History Main Topics  . Smoking status: Former Smoker -- 2.00 packs/day for 35 years    Types: Cigarettes    Quit date: 07/07/1994  . Smokeless tobacco: Never Used  . Alcohol Use: No  . Drug Use: No  . Sexual Activity: Not on file   Other Topics Concern  . None   Social History Narrative   Family History  Problem Relation Age of Onset  . Leukemia Paternal Grandmother   . Cancer Maternal Grandmother     stomach  . Cancer Paternal Aunt   . Other Father     bacterial endocarditis  . Rheum arthritis Father    Allergies  Allergen Reactions  . Other Other (See Comments)    Pt states that inhaled medications make him depressed and have negative thoughts.     Prior to Admission medications   Medication Sig Start Date End Date Taking? Authorizing Provider  amLODipine (NORVASC) 5 MG tablet Take 5 mg by mouth daily.   Yes Historical Provider, MD  aspirin EC 81 MG tablet Take 81 mg by mouth daily.   Yes Historical Provider, MD  atorvastatin (LIPITOR) 40 MG tablet Take 40 mg by mouth at bedtime.   Yes Historical Provider, MD  bisoprolol (ZEBETA) 10 MG tablet  Take 10 mg by mouth daily.   Yes Historical Provider, MD  carbidopa-levodopa (SINEMET IR) 25-100 MG per tablet Take 2 tablets by mouth 3 (three) times daily.    Yes Historical Provider, MD  clonazePAM (KLONOPIN) 1 MG tablet Take 0.5 mg by mouth 3 (three) times daily as needed for anxiety.   Yes Historical Provider, MD  HYDROcodone-acetaminophen (NORCO/VICODIN) 5-325 MG per tablet Take 1 tablet by mouth every 8 (eight) hours as needed for moderate pain.   Yes Historical Provider, MD  ipratropium-albuterol (DUONEB) 0.5-2.5 (3) MG/3ML SOLN Take 3 mLs by nebulization every 6 (six) hours as needed (for shortness of breath).    Yes Juanito Doom, MD   isosorbide mononitrate (IMDUR) 30 MG 24 hr tablet Take 30 mg by mouth daily.   Yes Historical Provider, MD  lamoTRIgine (LAMICTAL) 25 MG tablet Take 25 mg by mouth 2 (two) times daily.   Yes Historical Provider, MD  levothyroxine (SYNTHROID, LEVOTHROID) 137 MCG tablet Take 137 mcg by mouth daily.   Yes Historical Provider, MD  losartan (COZAAR) 100 MG tablet Take 100 mg by mouth daily.   Yes Historical Provider, MD  metFORMIN (GLUCOPHAGE) 500 MG tablet Take 500 mg by mouth 2 (two) times daily.    Yes Historical Provider, MD  omeprazole (PRILOSEC) 20 MG capsule Take 40 mg by mouth 2 (two) times daily.   Yes Historical Provider, MD  PARoxetine (PAXIL) 20 MG tablet Take 60 mg by mouth daily.    Yes Historical Provider, MD  Sennosides 25 MG TABS Take 1 tablet by mouth as needed (for constipation).   Yes Historical Provider, MD  tamsulosin (FLOMAX) 0.4 MG CAPS Take 0.4 mg by mouth daily.    Yes Historical Provider, MD  tiZANidine (ZANAFLEX) 4 MG tablet Take 4 mg by mouth every 8 (eight) hours as needed for muscle spasms.   Yes Historical Provider, MD  warfarin (COUMADIN) 4 MG tablet Take 4 mg by mouth every evening.    Yes Historical Provider, MD  budesonide (PULMICORT) 0.25 MG/2ML nebulizer solution Take 2 mLs (0.25 mg total) by nebulization as needed. Patient not taking: Reported on 07/12/2014 09/02/13   Juanito Doom, MD   No results found.  Positive ROS: All other systems have been reviewed and were otherwise negative with the exception of those mentioned in the HPI and as above.  Physical Exam: General:  Alert, no acute distress Psychiatric:  Patient is competent for consent with normal mood and affect   Cardiovascular:  No pedal edema Respiratory:  No wheezing, non-labored breathing, but he does have a productive cough GI:  Abdomen is soft and non-tender Skin:  No lesions in the area of chief complaint Neurologic:  Sensation intact distally Lymphatic:  No axillary or cervical  lymphadenopathy  Orthopedic Exam:  Skin inspection of the upper and lower back is unremarkable. He has no tenderness to palpation along the thoracic or lumbar spine, nor across the sacrum. He is neurovascularly intact in both lower extremity is, demonstrating intact sensation to light touch to all distributions, and 4+/5 strength in all groups of both lower extremities. He may have some slight symmetrical weakness of the toe dorsiflexors bilaterally, but this appears to be minimal. He appears to have positive supine straight leg raises bilaterally to 35, but can tolerate hip flexion beyond 90 bilaterally, as well as internal and external rotation of both hips at 90 without pain bilaterally. He has good capillary refill to both feet.  X-rays:  Recent AP  and lateral x-rays of the thoracic spine are available for review. These films demonstrate moderate multilevel degenerative changes involving the mid and lower thoracic spine. There is no obvious compression fractures or lytic lesions. No spondylolysis or spondylolisthesis is identified.  Recent AP and lateral x-rays of the lumbar spine also are available for review. These films demonstrate moderate multilevel degenerative changes, primarily involving the mid and lower lumbar spine with a grade 1 anterolisthesis of L3 on L4. There is no evidence for spondylolysis. No lytic lesions or compression fractures are identified.  Assessment: Low back pain secondary to lumbar degenerative disc disease with probable significant stenosis.  Plan: Based on his examination findings, most likely his lower back symptoms are due to an exacerbation of his underlying stenosis resulting from his long-standing lumbar spondylosis. I do feel that an MRI scan of the lumbar spine is appropriate to evaluate the degree of stenosis, as well as to rule out a compression fracture or other occult pathology. Given that he is not a surgical candidate, he might beneft from a lumbar  epidural steroid injection if the MRI scan confirms the presence of stenosis but does not show any other pathology.  Thank you for ask me to participate in the care of this most unfortunate man. I will be happy to follow him with you.   Pascal Lux, MD  Beeper #:  [phone]  02/01/2015 10:34 AM

## 2015-02-01 NOTE — Progress Notes (Signed)
PT Cancellation Note  Patient Details Name: John Mendoza MRN: [id number] DOB: 1945-10-24   Cancelled Treatment:    Reason Eval/Treat Not Completed: Medical issues which prohibited therapy. Recent findings from MRI resultant in transfer to [facility] soon. Dr. [name] requests treatment be held at this time.    Katoria Yetman C 02/01/2015, 3:04 PM  3:05 PM  Etta Grandchild, PT, DPT [place] License # [id number]

## 2015-02-01 NOTE — Progress Notes (Signed)
Report called to [facility] on [id number]. RN aware that [facility] has already come and picked up patient.  Calypso Hagarty, Caryn Bee RN BSN

## 2015-02-01 NOTE — Progress Notes (Signed)
Received call back from Mrs. Jernberg. She is updated on patients transfer and has his room number and unit telephone number.

## 2015-02-01 NOTE — Care Management (Signed)
Patient has ruled in for discitis/verterbral osteomyelitis and will be transferred to [facility] for neurosurgery consideration.  This transfer has been arranged by attending

## 2015-02-01 NOTE — Progress Notes (Signed)
Patient ID: John Mendoza, male   DOB: 1945/09/06  SUBJECTIVE:  Admitted with acute resp failure; has h/o recurrent aspiration due to prior head and neck cancer treatment as well as positioning of neck due to cervical DDD; has CAD, COPD, DM, seizure disorder, Parkinson's, h/o MAI, h/o ANCA positive/MPA with prior treatment at [facility]. resp status continues to do well on oral medications and aggressive COPD regimen.  Back pain has progressed and continues to limit any mobility.  No fever, chills. BP up this AM, but has been labile and pt in pain.   ______________________________________________________________________  ROS: Please see HPI; remainder of complete 10 point ROS is negative   Past Medical History  Diagnosis Date  . Barrett esophagus   . Hypertension   . Hyperlipidemia   . Depression   . Multiple pulmonary nodules   . CHF (congestive heart failure)   . Lumbar spinal stenosis   . S/P CABG (coronary artery bypass graft)   . COPD (chronic obstructive pulmonary disease)   . CAD (coronary artery disease)   . Basal cell carcinoma   . Diabetes mellitus   . Cancer of neck   . Heart attack     Past Surgical History  Procedure Laterality Date  . Bypass graft    . Cervical fusion    . Radial neck dissection    . Multiple stents    . Total knee arthroplasty  2011  . Thyroidectomy  1996     Current facility-administered medications:  .  acetaminophen (TYLENOL) tablet 650 mg, 650 mg, Oral, Q6H PRN, Harrie Foreman, MD, 650 mg at 01/26/15 2324 .  albuterol (PROVENTIL) (2.5 MG/3ML) 0.083% nebulizer solution 2.5 mg, 2.5 mg, Nebulization, Q4H, Flora Lipps, MD, 2.5 mg at 02/01/15 0410 .  amLODipine (NORVASC) tablet 5 mg, 5 mg, Oral, QHS, Tama High III, MD .  aspirin EC tablet 81 mg, 81 mg, Oral, Daily, Demetrios Loll, MD, 81 mg at 01/31/15 1023 .  atorvastatin (LIPITOR) tablet 40 mg, 40 mg, Oral, QHS, Demetrios Loll, MD, 40 mg at 01/31/15 2120 .  bisacodyl (DULCOLAX) suppository 10 mg, 10  mg, Rectal, Daily PRN, Tama High III, MD .  bisoprolol (ZEBETA) tablet 10 mg, 10 mg, Oral, Daily, Demetrios Loll, MD, 10 mg at 01/31/15 2123 .  budesonide (PULMICORT) nebulizer solution 0.5 mg, 0.5 mg, Nebulization, BID, Flora Lipps, MD, 0.5 mg at 01/31/15 2026 .  carbidopa-levodopa (SINEMET IR) 25-100 MG per tablet immediate release 3 tablet, 3 tablet, Oral, TID, Tama High III, MD, 3 tablet at 01/31/15 2121 .  cefUROXime (CEFTIN) tablet 500 mg, 500 mg, Oral, BID WC, Tama High III, MD, 500 mg at 01/31/15 1732 .  clonazePAM (KLONOPIN) tablet 0.5 mg, 0.5 mg, Oral, TID PRN, Demetrios Loll, MD, 0.5 mg at 02/01/15 0636 .  docusate sodium (COLACE) capsule 100 mg, 100 mg, Oral, BID, Tama High III, MD, 100 mg at 01/31/15 2123 .  isosorbide mononitrate (IMDUR) 24 hr tablet 30 mg, 30 mg, Oral, Daily, Demetrios Loll, MD, 30 mg at 01/31/15 2122 .  lamoTRIgine (LAMICTAL) tablet 50 mg, 50 mg, Oral, BID, Tama High III, MD, 50 mg at 01/31/15 2122 .  levothyroxine (SYNTHROID, LEVOTHROID) tablet 137 mcg, 137 mcg, Oral, QAC breakfast, Tama High III, MD, 137 mcg at 01/31/15 0800 .  losartan (COZAAR) tablet 100 mg, 100 mg, Oral, Daily, Tama High III, MD .  magnesium hydroxide (MILK OF MAGNESIA) suspension 30 mL, 30 mL, Oral, Daily  PRN, Tama High III, MD, 30 mL at 01/30/15 [id number] .  mometasone-formoterol (DULERA) 200-5 MCG/ACT inhaler 2 puff, 2 puff, Inhalation, BID, Flora Lipps, MD, 2 puff at 01/31/15 2120 .  oxyCODONE-acetaminophen (PERCOCET/ROXICET) 5-325 MG per tablet 1 tablet, 1 tablet, Oral, Q6H PRN, Tama High III, MD, 1 tablet at 02/01/15 0434 .  pantoprazole (PROTONIX) EC tablet 40 mg, 40 mg, Oral, Daily, Demetrios Loll, MD, 40 mg at 01/31/15 1023 .  PARoxetine (PAXIL) tablet 60 mg, 60 mg, Oral, Daily, Demetrios Loll, MD, 60 mg at 01/31/15 1023 .  predniSONE (DELTASONE) tablet 40 mg, 40 mg, Oral, Q breakfast, Tama High III, MD .  tamsulosin [facility]) capsule 0.4 mg, 0.4 mg, Oral, Daily, Demetrios Loll, MD, 0.4 mg at  01/31/15 1023 .  tiotropium (SPIRIVA) inhalation capsule 18 mcg, 18 mcg, Inhalation, Daily, Flora Lipps, MD, 18 mcg at 01/31/15 1024 .  tiZANidine (ZANAFLEX) tablet 4 mg, 4 mg, Oral, Q8H PRN, Demetrios Loll, MD, 4 mg at 02/01/15 0636 .  warfarin (COUMADIN) tablet 3 mg, 3 mg, Oral, q1800, Tama High III, MD, 3 mg at 01/31/15 1732 .  Warfarin - Physician Dosing Inpatient, , Does not apply, q1800, Tama High III, MD  PHYSICAL EXAM:  BP 192/104 mmHg  Pulse 73  Temp(Src) 98 F (36.7 C) (Oral)  Resp 21  Ht 5' 8.5" (1.74 m)  Wt 90.765 kg (200 lb 1.6 oz)  BMI 29.98 kg/m2  SpO2 96%  General: pleasant  male, appears uncomfortable HEENT: PERRL; OP moist without lesions.  Papular rash on back of head Neck: supple, trachea midline, no thyromegaly Chest: normal to palpation Lungs: coarse rales bilaterally, with slight improvement, without retractions or wheezes Cardiovascular: RRR,  no gallop; distal pulses 2+ Abdomen: soft, nontender, slightly distended, positive bowel sounds Extremities: no clubbing, cyanosis, edema. Severe back pain elicited by lateral pressure on either hip Neuro: alert, moves all extremities, with leg movement limited by pain.  Chronic hoarseness.  Derm:  Rash as above; good skin turgor Lymph: no cervical or supraclavicular lymphadenopathy  Labs and imaging studies were reviewed  ASSESSMENT/PLAN:   1. Aspiration pneumonitis/acute resp failure/sepsis/group B strep bacteremia- clinically improving; cont current therapy.  Cont thickened liquids as per SLP prior recommendations.  On home oxygen. 2. DDD- Now preventing discharge it limits mobility.  Films with arthritis/DDD, but no fracture noted.  Given recent bacteremia and worsening symptoms, will get MRI with and w/o to evaluate for discitis as well as worsening DDD/impingement. Ortho to see.   3. DM- cont covering with SSI; he is aware glucose will be worse with steroids 4. Parkinsons- remains on home regimen 5.  CAD/accelerated hypertension- Has labile BP at baseline, with orthostatic hypotension at times; cont to adjust meds and follow 6. H/o DVT- On chronic coumadin at home; watching CBC, INR 7. D/c plan- Has not wanted to consider STR but may need to consider this if back pain continues to limit progress   BERT Briscoe Burns III, MD

## 2015-02-02 DIAGNOSIS — M4640 Discitis, unspecified, site unspecified: Secondary | ICD-10-CM | POA: Insufficient documentation

## 2015-02-02 DIAGNOSIS — Z86718 Personal history of other venous thrombosis and embolism: Secondary | ICD-10-CM

## 2015-02-02 DIAGNOSIS — M4620 Osteomyelitis of vertebra, site unspecified: Secondary | ICD-10-CM

## 2015-02-02 DIAGNOSIS — Z7952 Long term (current) use of systemic steroids: Secondary | ICD-10-CM

## 2015-02-02 DIAGNOSIS — M4626 Osteomyelitis of vertebra, lumbar region: Secondary | ICD-10-CM

## 2015-02-02 DIAGNOSIS — J189 Pneumonia, unspecified organism: Secondary | ICD-10-CM

## 2015-02-02 LAB — BASIC METABOLIC PANEL
ANION GAP: 7 (ref 5–15)
BUN: 15 mg/dL (ref 6–20)
CO2: 32 mmol/L (ref 22–32)
CREATININE: 0.7 mg/dL (ref 0.61–1.24)
Calcium: 9.1 mg/dL (ref 8.9–10.3)
Chloride: 97 mmol/L — ABNORMAL LOW (ref 101–111)
GFR calc non Af Amer: >60 mL/min (ref >60)
Glucose, Bld: 138 mg/dL — ABNORMAL HIGH (ref 65–99)
Potassium: 4.2 mmol/L (ref 3.5–5.1)
Sodium: 136 mmol/L (ref 135–145)

## 2015-02-02 LAB — CBC
HCT: 31.8 % — ABNORMAL LOW (ref 39.0–52.0)
Hemoglobin: 9.9 g/dL — ABNORMAL LOW (ref 13.0–17.0)
MCH: 23.9 pg — ABNORMAL LOW (ref 26.0–34.0)
MCHC: 31.1 g/dL (ref 30.0–36.0)
MCV: 76.8 fL — ABNORMAL LOW (ref 78.0–100.0)
Platelets: 262 10*3/uL (ref 150–400)
RBC: 4.14 MIL/uL — AB (ref 4.22–5.81)
RDW: 17.8 % — AB (ref 11.5–15.5)
WBC: 8.2 10*3/uL (ref 4.0–10.5)

## 2015-02-02 LAB — HEMOGLOBIN A1C
HEMOGLOBIN A1C: 7.3 % — AB (ref 4.8–5.6)
MEAN PLASMA GLUCOSE: 163 mg/dL

## 2015-02-02 LAB — PROTIME-INR
INR: 1.56 — ABNORMAL HIGH (ref 0.00–1.49)
PROTHROMBIN TIME: 18.7 s — AB (ref 11.6–15.2)

## 2015-02-02 LAB — GLUCOSE, CAPILLARY
GLUCOSE-CAPILLARY: 193 mg/dL — AB (ref 65–99)
GLUCOSE-CAPILLARY: 190 mg/dL — AB (ref 65–99)
Glucose-Capillary: 137 mg/dL — ABNORMAL HIGH (ref 65–99)
Glucose-Capillary: 207 mg/dL — ABNORMAL HIGH (ref 65–99)

## 2015-02-02 MED ORDER — MORPHINE SULFATE 15 MG PO TABS
15.0000 mg | ORAL_TABLET | ORAL | Status: DC | PRN
  Administered 2015-02-02 – 2015-02-03 (×3): 15 mg via ORAL
  Filled 2015-02-02 (×2): qty 1

## 2015-02-02 MED ORDER — CEFTRIAXONE SODIUM IN DEXTROSE 40 MG/ML IV SOLN
2.0000 g | INTRAVENOUS | Status: DC
  Administered 2015-02-02 – 2015-02-08 (×7): 2 g via INTRAVENOUS
  Filled 2015-02-02 (×7): qty 50

## 2015-02-02 MED ORDER — ALBUTEROL SULFATE (2.5 MG/3ML) 0.083% IN NEBU
2.5000 mg | INHALATION_SOLUTION | RESPIRATORY_TRACT | Status: DC
  Administered 2015-02-02 (×2): 2.5 mg via RESPIRATORY_TRACT
  Filled 2015-02-02 (×2): qty 3

## 2015-02-02 MED ORDER — VITAMIN B-1 100 MG PO TABS
100.0000 mg | ORAL_TABLET | Freq: Every day | ORAL | Status: DC
  Administered 2015-02-02 – 2015-02-03 (×2): 100 mg via ORAL
  Filled 2015-02-02 (×2): qty 1

## 2015-02-02 MED ORDER — FOLIC ACID 1 MG PO TABS
1.0000 mg | ORAL_TABLET | Freq: Every day | ORAL | Status: DC
  Administered 2015-02-02 – 2015-02-03 (×2): 1 mg via ORAL
  Filled 2015-02-02 (×2): qty 1

## 2015-02-02 MED ORDER — MORPHINE SULFATE 2 MG/ML IJ SOLN
2.0000 mg | INTRAMUSCULAR | Status: DC | PRN
  Administered 2015-02-02 – 2015-02-03 (×3): 2 mg via INTRAVENOUS
  Filled 2015-02-02 (×3): qty 1

## 2015-02-02 NOTE — Progress Notes (Signed)
Per Dr. Estanislado Pandy disc aspiration not indicated at this time as blood Cx are (+). He has spoke with Dr. Linus Salmons of infectious disease. Please call him with any questions [place] PA-C Interventional Radiology  02/02/15 9:26 AM

## 2015-02-02 NOTE — Consult Note (Addendum)
[place] for Infectious Disease    Date of Admission:  02/01/2015   Total days of antibiotics 7        Day 2 ceftriaxone       Reason for Consult: Discitis    Referring Physician: Dr. Grandville Silos   Principal Problem:   Discitis of lumbar region Active Problems:   Multiple pulmonary nodules   Aspiration pneumonitis   Hypertension   Barrett's esophagus   CAD (coronary artery disease)   COPD (chronic obstructive pulmonary disease)   History of DVT (deep vein thrombosis)   Acute on chronic respiratory failure with hypoxia   Bacteremia due to group B Streptococcus   Antineutrophil cytoplasmic antibody (ANCA) positive   Goodpasture's disease   Epidural abscess   Warfarin anticoagulation   Parkinsons disease   Vertebral osteomyelitis   Dysphagia/chronic   . amLODipine  5 mg Oral QHS  . antiseptic oral rinse  7 mL Mouth Rinse BID  . atorvastatin  40 mg Oral QHS  . bisoprolol  10 mg Oral QHS  . budesonide  0.25 mg Nebulization BID  . carbidopa-levodopa  2 tablet Oral TID  . cefTRIAXone (ROCEPHIN) IVPB 1 gram/50 mL D5W  1 g Intravenous Q24H  . docusate sodium  100 mg Oral BID  . folic acid  1 mg Oral Daily  . insulin aspart  0-20 Units Subcutaneous TID WC  . insulin aspart  0-5 Units Subcutaneous QHS  . isosorbide mononitrate  30 mg Oral Daily  . lamoTRIgine  25 mg Oral BID  . levothyroxine  137 mcg Oral QAC breakfast  . losartan  100 mg Oral Daily  . mometasone-formoterol  2 puff Inhalation BID  . pantoprazole  40 mg Oral Daily  . PARoxetine  60 mg Oral Daily  . predniSONE  20 mg Oral Q breakfast  . sodium chloride  3 mL Intravenous Q12H  . tamsulosin  0.4 mg Oral Daily  . thiamine  100 mg Oral Daily  . tiotropium  18 mcg Inhalation Daily    Recommendations: 1. We recommend ceftriaxone 2g IV daily for 6-8 weeks 2. Weekly cbc, cmp 3. Antibiotics per home health protocol 4. Blood cultures repeated yesterday so should wait until Sunday for picc line to  assure they are clearing 5. Routine HIV screen 6. Will arrange follow up with ID in [place], Dr. Ola Spurr  Assessment: John Mendoza has discitis/vertebral osteomyelitis/epidural abscess at L4-L5 likely from Group B Strep hematogenous spread given his bacteremia one week ago. I suspect there was underlying osteopenia related to his chronic prednisone usage for Goodpasture's. He denies signs of cord compression right now and his pneumonia is improving. We recommend holding aspiration because the source of his spinal infection is c/w GBS and no debridement would be done.   HPI: John Mendoza is a 69 y.o. 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 Blood cultures were positive for Group B Strep so we has treated with ceftriaxone. He has baseline back pain from lumbar stenosis but this worsened during his hospital stay. A contrast MRI on 7/28 showed discitis/vertebral osteomyelitis/epidural abscess between L4-L5. He denies signs of cord compression.   Review of Systems  Constitutional: Positive for fever and chills.  Respiratory: Positive for cough, sputum production and shortness of breath. Negative for hemoptysis.   Cardiovascular: Negative for chest pain and palpitations.  Gastrointestinal: Negative  for nausea, vomiting, abdominal pain and diarrhea.  Genitourinary: Negative for dysuria.  Musculoskeletal: Positive for back pain.  Skin: Negative for rash.  Neurological: Negative for focal weakness, weakness and headaches.    Past Medical History  Diagnosis Date  . Barrett esophagus     "pretty bad" (02/01/2015)  . Hypertension   . Hyperlipidemia   . Multiple pulmonary nodules   . CHF (congestive heart failure)   . Lumbar spinal stenosis   . COPD (chronic obstructive pulmonary disease)   . CAD (coronary artery disease)   . On home oxygen therapy     "2L unless I'm outside"  (02/01/2015)  . DVT (deep venous thrombosis) 11/2013; 02/01/2015    John Mendoza 01/18/2014; "got one behind my right knee now" (02/01/2015)  . Parkinson's disease     /notes 08/15/2014  . Recurrent aspiration pneumonia   . Sleep apnea     "gave machine back; couldn't use it" (02/01/2015)  . Hypothyroidism   . Type II diabetes mellitus   . Iron deficiency anemia     "real bad" (02/01/2015)  . GERD (gastroesophageal reflux disease)   . Seizures     "don't know what they are from; last one was ~ 4 months ago" (02/01/2015)  . DJD (degenerative joint disease)     John Mendoza 08/15/2014  . Arthritis     "real bad in my back" (02/01/2015)  . Chronic lower back pain   . History of gout   . Depression     "maybe a touch before I started taking seizure RX" (02/01/2015)  . Anxiety   . Basal cell carcinoma     "all over my face and body"   . Throat cancer     John Mendoza 08/15/2014  . Heart attack 03/20/1995    History  Substance Use Topics  . Smoking status: Former Smoker -- 2.00 packs/day for 35 years    Types: Cigarettes    Quit date: 03/20/1995  . Smokeless tobacco: Never Used  . Alcohol Use: Yes     Comment: 02/01/2015 "stopped drinking in ~ 1996; never had problem w/it"    Family History  Problem Relation Age of Onset  . Leukemia Paternal Grandmother   . Cancer Maternal Grandmother     stomach  . Cancer Paternal Aunt   . Other Father     bacterial endocarditis  . Rheum arthritis Father    Allergies  Allergen Reactions  . Other Other (See Comments)    Dizziness, lightheadedness, Pt states that inhaled medications make him depressed and have negative thoughts.     ROS: no dizziness, no diarrhea, no rashes.  Otherwise comprehensive 12-pt ROS done and is negative.    OBJECTIVE: Blood pressure 174/79, pulse 61, temperature 97.9 F (36.6 C), temperature source Oral, resp. rate 20, weight 91.2 kg (201 lb 1 oz), SpO2 97 %. Physical Exam  Constitutional: He appears well-developed.  HENT:  Head:  Normocephalic and atraumatic.  Eyes: Conjunctivae are normal.  Neck: Normal range of motion.  Cardiovascular: Normal rate and regular rhythm.  Exam reveals no gallop and no friction rub.   No murmur heard. Pulmonary/Chest: Effort normal. He has rales.  Abdominal: Soft. Bowel sounds are normal.  Musculoskeletal:  Point tenderness of lumbar spinous process. There was a pad over this area but there were no signs of skin infection. Distal sensation was intact to light touch. Able to move his toes and has been walking to bathroom.    Lab Results Lab Results  Component Value  Date   WBC 8.2 02/02/2015   HGB 9.9* 02/02/2015   HCT 31.8* 02/02/2015   MCV 76.8* 02/02/2015   PLT 262 02/02/2015    Lab Results  Component Value Date   CREATININE 0.70 02/02/2015   BUN 15 02/02/2015   NA 136 02/02/2015   K 4.2 02/02/2015   CL 97* 02/02/2015   CO2 32 02/02/2015    Lab Results  Component Value Date   ALT 15* 02/01/2015   AST 18 02/01/2015   ALKPHOS 60 02/01/2015   BILITOT 0.6 02/01/2015     Microbiology: Recent Results (from the past 240 hour(s))  Blood Culture (routine x 2)     Status: None   Collection Time: 01/26/15  7:46 PM  Result Value Ref Range Status   Specimen Description BLOOD LEFT ASSIST CONTROL  Final   Special Requests BOTTLES DRAWN AEROBIC AND ANAEROBIC 5CC  Final   Culture  Setup Time   Final    GRAM POSITIVE COCCI IN CHAINS IN BOTH AEROBIC AND ANAEROBIC BOTTLES CRITICAL RESULT CALLED TO, READ BACK BY AND VERIFIED WITH: MYRA FLOWERS,RN AT 0800 01/27/2015 BY JRS    Culture   Final    STREPTOCOCCUS AGALACTIAE Virtually 100% of S. agalactiae (Group B) strains are susceptible to Penicillin.  For Penicillin-allergic patients, Erythromycin (85-95% sensitive) and Clindamycin (80% sensitive) are drugs of choice. Contact microbiology lab to request sensitivities if  needed within 7 days.    Report Status 01/29/2015 FINAL  Final   Organism ID, Bacteria STREPTOCOCCUS AGALACTIAE   Final      Susceptibility   Streptococcus agalactiae - MIC*    VANCOMYCIN <=0.5 SENSITIVE Sensitive     CLINDAMYCIN <=0.25 RESISTANT Resistant     * STREPTOCOCCUS AGALACTIAE  Blood Culture (routine x 2)     Status: None   Collection Time: 01/26/15  7:46 PM  Result Value Ref Range Status   Specimen Description BLOOD LEFT HAND  Final   Special Requests BOTTLES DRAWN AEROBIC AND ANAEROBIC 5CC  Final   Culture  Setup Time   Final    GRAM POSITIVE COCCI IN CHAINS IN BOTH AEROBIC AND ANAEROBIC BOTTLES CRITICAL VALUE NOTED.  VALUE IS CONSISTENT WITH PREVIOUSLY REPORTED AND CALLED VALUE.    Culture   Final    STREPTOCOCCUS AGALACTIAE Virtually 100% of S. agalactiae (Group B) strains are susceptible to Penicillin.  For Penicillin-allergic patients, Erythromycin (85-95% sensitive) and Clindamycin (80% sensitive) are drugs of choice. Contact microbiology lab to request sensitivities if  needed within 7 days.    Report Status 01/29/2015 FINAL  Final   Organism ID, Bacteria STREPTOCOCCUS AGALACTIAE  Final      Susceptibility   Streptococcus agalactiae - MIC*    VANCOMYCIN <=0.5 SENSITIVE Sensitive     CLINDAMYCIN <=0.25 RESISTANT Resistant     * STREPTOCOCCUS AGALACTIAE  MRSA PCR Screening     Status: None   Collection Time: 01/26/15 10:00 PM  Result Value Ref Range Status   MRSA by PCR NEGATIVE NEGATIVE Final    Comment:        The GeneXpert MRSA Assay (FDA approved for NASAL specimens only), is one component of a comprehensive MRSA colonization surveillance program. It is not intended to diagnose MRSA infection nor to guide or monitor treatment for MRSA infections.   Urine culture     Status: None   Collection Time: 01/27/15  4:39 AM  Result Value Ref Range Status   Specimen Description URINE, RANDOM  Final   Special Requests NONE  Final   Culture NO GROWTH 1 DAY  Final   Report Status 01/28/2015 FINAL  Final  Culture, expectorated sputum-assessment     Status: None    Collection Time: 01/28/15  3:05 PM  Result Value Ref Range Status   Specimen Description SPUTUM  Final   Special Requests NONE  Final   Sputum evaluation THIS SPECIMEN IS ACCEPTABLE FOR SPUTUM CULTURE  Final   Report Status 01/28/2015 FINAL  Final  Culture, respiratory (NON-Expectorated)     Status: None   Collection Time: 01/28/15  3:05 PM  Result Value Ref Range Status   Specimen Description SPUTUM  Final   Special Requests NONE Reflexed from [place]  Final   Gram Stain   Final    EXCELLENT SPECIMEN - 90-100% WBCS MANY WBC SEEN MODERATE YEAST FEW GRAM NEGATIVE RODS    Culture MODERATE GROWTH CANDIDA SPECIES, NOT ALBICANS  Final   Report Status 01/31/2015 FINAL  Final    Loleta Chance, MD [place] [facility] 02/02/2015, 10:11 AM

## 2015-02-02 NOTE — Progress Notes (Signed)
Utilization review completed.  L. J. Jemiah Cuadra RN, BSN, CM 

## 2015-02-02 NOTE — Evaluation (Signed)
Clinical/Bedside Swallow Evaluation Patient Details  Name: John Mendoza MRN: [id number] Date of Birth: Mar 06, 1946  Today's Date: 02/02/2015 Time: SLP Start Time (ACUTE ONLY): 7035 SLP Stop Time (ACUTE ONLY): 1357 SLP Time Calculation (min) (ACUTE ONLY): 16 min  Past Medical History:  Past Medical History  Diagnosis Date  . Barrett esophagus     "pretty bad" (02/01/2015)  . Hypertension   . Hyperlipidemia   . Multiple pulmonary nodules   . CHF (congestive heart failure)   . Lumbar spinal stenosis   . COPD (chronic obstructive pulmonary disease)   . CAD (coronary artery disease)   . On home oxygen therapy     "2L unless I'm outside" (02/01/2015)  . DVT (deep venous thrombosis) 11/2013; 02/01/2015    [name] 01/18/2014; "got one behind my right knee now" (02/01/2015)  . Parkinson's disease     /notes 08/15/2014  . Recurrent aspiration pneumonia   . Sleep apnea     "gave machine back; couldn't use it" (02/01/2015)  . Hypothyroidism   . Type II diabetes mellitus   . Iron deficiency anemia     "real bad" (02/01/2015)  . GERD (gastroesophageal reflux disease)   . Seizures     "don't know what they are from; last one was ~ 4 months ago" (02/01/2015)  . DJD (degenerative joint disease)     [name] 08/15/2014  . Arthritis     "real bad in my back" (02/01/2015)  . Chronic lower back pain   . History of gout   . Depression     "maybe a touch before I started taking seizure RX" (02/01/2015)  . Anxiety   . Basal cell carcinoma     "all over my face and body"   . Throat cancer     [name] 08/15/2014  . Heart attack 03/20/1995   Past Surgical History:  Past Surgical History  Procedure Laterality Date  . Anterior cervical decomp/discectomy fusion  X 2  . Radical neck dissection Right ~ 1998    "throat cancer"  . Total knee arthroplasty Right 2011  . Thyroidectomy  ~ 1996 X 2    "took 1/2 out at a time"  . Cataract extraction w/ intraocular lens  implant, bilateral Bilateral   . Tonsillectomy     . Excisional hemorrhoidectomy    . Joint replacement    . Back surgery    . Posterior fusion cervical spine  X 1  . Coronary angioplasty with stent placement      "I've got at least 3" (02/01/2015)  . Coronary artery bypass graft  1996    "CABG X3"  . Basal cell carcinoma excision      "all over my face and body"    HPI:  This is a 69 year old male patient with multiple medical problems including Goodpasture's disease with positive ANCA on chronic prednisone, Barrett's esophagitis, hypertension, COPD on chronic oxygen 2 L/m, Parkinson's disease, CAD, multiple pulmonary nodules, history of DVT June 2015. Patient was recently admitted to [facility] on 7/22 secondary to sepsis from aspiration pneumonitis and later found to have group B streptococcal bacteremia. MRI of the lumbar spine without contrast revealed L4-L5 discitis and vertebral osteomyelitis with epidural abscess. Pt has a h/o chronic dysphagia s/p XRT and surgical intervention for throat cancer, s/p ACDF, and possibly impacted by PD. Pt has had previous swallowing evaluations at outside facilities, most recently recommending nectar thick liquids.   Assessment / Plan / Recommendation Clinical Impression  Pt has a chronic  dysphagia, for which he has previously been recommended to consume nectar-thick liquids and has been doing so inconsistently at home. Upon arrival, pt was trying to eat a peanut butter sandwich with frequent coughing. Sips of thin liquid elicit the same response. Nectar thick liquids are better tolerated, but still elicit a cough x2 across 4 oz of liquid. Suspect that this is largely attributable to pt's abseline dysphagia, however there is the possible for acute decline given current acute infection. Will adjust diet to Dys 2 and nectar thick liquids until MBS can be completed on next date.    Aspiration Risk  Moderate    Diet Recommendation Dysphagia 2 (Fine chop);Nectar   Medication Administration:  Crushed with puree Compensations: Slow rate;Small sips/bites;Hard cough after swallow    Other  Recommendations Oral Care Recommendations: Oral care BID Other Recommendations: Order thickener from pharmacy;Prohibited food (jello, ice cream, thin soups);Remove water pitcher   Follow Up Recommendations   tba pending MBS    Pertinent Vitals/Pain n/a    SLP Swallow Goals     Swallow Study Prior Functional Status       General Other Pertinent Information: This is a 68 year old male patient with multiple medical problems including Goodpasture's disease with positive ANCA on chronic prednisone, Barrett's esophagitis, hypertension, COPD on chronic oxygen 2 L/m, Parkinson's disease, CAD, multiple pulmonary nodules, history of DVT June 2015. Patient was recently admitted to [facility] on 7/22 secondary to sepsis from aspiration pneumonitis and later found to have group B streptococcal bacteremia. MRI of the lumbar spine without contrast revealed L4-L5 discitis and vertebral osteomyelitis with epidural abscess. Pt has a h/o chronic dysphagia s/p XRT and surgical intervention for throat cancer, s/p ACDF, and possibly impacted by PD. Pt has had previous swallowing evaluations at outside facilities, most recently recommending nectar thick liquids. Type of Study: Bedside swallow evaluation Previous Swallow Assessment: see HPI Diet Prior to this Study: Regular;Thin liquids Temperature Spikes Noted: No Respiratory Status: Supplemental O2 delivered via (comment) ([place]) History of Recent Intubation: No Behavior/Cognition: Alert;Cooperative;Pleasant mood Oral Cavity - Dentition: Edentulous Self-Feeding Abilities: Able to feed self Patient Positioning: Upright in bed Baseline Vocal Quality: Normal    Oral/Motor/Sensory Function Overall Oral Motor/Sensory Function: Appears within functional limits for tasks assessed   Ice Chips Ice chips: Not tested   Thin Liquid Thin Liquid:  Impaired Presentation: Self Fed;Straw Pharyngeal  Phase Impairments: Multiple swallows;Cough - Immediate    Nectar Thick Nectar Thick Liquid: Impaired Presentation: Self Fed;Straw Pharyngeal Phase Impairments: Cough - Immediate;Multiple swallows   Honey Thick Honey Thick Liquid: Not tested   Puree     Solid    Solid: Impaired Presentation: Self Fed Pharyngeal Phase Impairments: Cough - Immediate      Germain Osgood, M.A. CCC-SLP [phone]  Germain Osgood 02/02/2015,3:21 PM

## 2015-02-02 NOTE — Care Management Note (Addendum)
Case Management Note  Patient Details  Name: John Mendoza MRN: [id number] Date of Birth: [birth year]  Subjective/Objective:                   Katrina Stack, RN Case Manager Signed Nursing Care Management 01/30/2015 5:05 PM    Expand All Collapse All   Patient admitted with respiratory failure due to aspiration pneumonia. Lives at home with his wife . Both use chronic 02 through [facility]. Physical therapy has recommended skilled nursing but patient wishes to return home with home health through [facility]. Says his wife would come and pick him up to transport home. Has a walker and bedside commode. Gave heads up referral to [facility] for SN PT Aide and SLP (for swallowing follow up). Patient says he has been on nectar thick before.            Patient transferred to [facility] from [facility].    Action/Plan:  Will continue to follow.  Expected Discharge Date:                  Expected Discharge Plan:  [place]  In-House Referral:     Discharge planning Services  CM Consult  Post Acute Care Choice:    Choice offered to:     DME Arranged:    DME Agency:     HH Arranged:    [place] Agency:     Status of Service:  In process, will continue to follow  Medicare Important Message Given:    Date Medicare IM Given:    Medicare IM give by:    Date Additional Medicare IM Given:    Additional Medicare Important Message give by:     If discussed at [place] of Stay Meetings, dates discussed:    Additional Comments:  Carles Collet, RN 02/02/2015, 10:06 AM

## 2015-02-02 NOTE — Progress Notes (Signed)
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 Patient also on chronic prednisone secondary to Goodpasture's. Patient's pneumonia is improving. Patient has been seen in consultation by ID that I recommended holding aspiration because saw also of infection likely consistent with group B streptococcus bacteremia. Repeat blood cultures were obtained and are pending. Continue IV Rocephin for at least 6-8 weeks per ID recommendations. Patient need to follow-up with ID as outpatient.  #2 GpB  streptococcus bacteremia Continue IV Rocephin.  #3 Pneumonia Patient with recent pneumonia approximately week prior to admission. Medical improvement. On IV Rocephin. Continue oxygen, nebulizers.  #4 COPD Stable. Continue Spiriva. Continue dulera.  #5 hypertension Continue current regimen of Norvasc, bisoprolol,imdur, Cozaar.  #6 coronary artery disease Stable. Continue home regimen of medications.  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 In review of medical record patient was initially diagnosed with a small popliteal DVT on 12/15/2013 -per documentation a CT angiogram of the chest was to be obtained to determine if had concomitant PE; as apparently was negative -On follow-up visit with pulmonologist August 2015 the recommendation was continue warfarin for 6 months -At follow-up visit with pulmonologist in 2016 it was documented that the patient's primary care physician recommended lifelong anticoagulation in setting of "idiopathic DVT" but pulmonologist expressed concerns that with patient's underlying Parkinson's disease that gait imbalance and therefore associative falls may become problematic and warrant later discontinuation of Coumadin -Present  time we are holding warfarin in anticipation of interventional radiology procedure -Vitamin K 5 mg by mouth 1 -Repeat PT/INR in a.m.  #8 Parkinson's Disease Continue sinemet.  #9 hypothyroidism Continue Synthroid.  #10 history of multiple pulmonary nodules Waunita Schooner followed in the outpatient setting by pulmonary.  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  #12 dysphagia Speech therapy has assessed the patient and patient has been placed on a dysphagia 2 diet with nectar thick liquids. MB is pending.  #13 prophylaxis SCDs for DVT prophylaxis.  Code Status: Full Family Communication: Updated patient at bedside. No family present. Disposition Plan: Home vs SNF when medically stable.   Consultants:  Infectious diseases: Dr. Linus Salmons 02/02/2015  Procedures:  MRI lumbar spine 02/01/2015  Antibiotics:  IV Rocephin 02/02/2015  HPI/Subjective: Patient complaining of back pain. No chest pain. No shortness of breath.  Objective: Filed Vitals:   02/02/15 0523  BP: 174/79  Pulse: 61  Temp:   Resp:     Intake/Output Summary (Last 24 hours) at 02/02/15 1237 Last data filed at 02/02/15 1104  Gross per 24 hour  Intake    360 ml  Output    900 ml  Net   -540 ml   Filed Weights   02/02/15 0523  Weight: 91.2 kg (201 lb 1 oz)    Exam:   General:  NAD  Cardiovascular: RRR  Respiratory: CTAB  Abdomen: Soft, nontender, nondistended, positive bowel sounds.  Musculoskeletal: No clubbing cyanosis or edema.  Data Reviewed: Basic Metabolic Panel:  Recent Labs Lab 01/29/15 0506 01/30/15 0410 01/31/15 0334 02/01/15 1737 02/02/15 0557  NA 138 137 139 134* 136  K 4.3 3.9 4.2 5.1 4.2  CL 101 99* 96* 92* 97*  CO2 30 32 36* 36* 32  GLUCOSE 131* 164* 149* 291* 138*  BUN 16 14 13 19 15   CREATININE 0.64  0.68 0.67 0.91 0.70  CALCIUM 8.7* 8.8* 9.0 9.2 9.1   Liver Function Tests:  Recent Labs Lab  01/26/15 1946 01/28/15 0454 02/01/15 1737  AST 16 22 18   ALT 14* <5* 15*  ALKPHOS 79 66 60  BILITOT 0.8 0.4 0.6  PROT 7.4 6.4* 6.2*  ALBUMIN 3.6 2.9* 2.5*   No results for input(s): LIPASE, AMYLASE in the last 168 hours. No results for input(s): AMMONIA in the last 168 hours. CBC:  Recent Labs Lab 01/26/15 1946 01/28/15 0454 01/29/15 0506 01/30/15 0410 02/01/15 1737 02/02/15 0557  WBC 13.9* 10.1 11.3* 10.1 8.6 8.2  NEUTROABS 13.3*  --   --   --  8.1*  --   HGB 10.8* 9.6* 9.4* 9.2* 9.4* 9.9*  HCT 34.2* 30.5* 30.4* 29.7* 30.3* 31.8*  MCV 74.8* 76.0* 76.0* 75.4* 76.1* 76.8*  PLT 182 171 201 204 261 262   Cardiac Enzymes:  Recent Labs Lab 01/26/15 1946  TROPONINI 0.03   BNP (last 3 results)  Recent Labs  01/26/15 1946  BNP 420.0*    ProBNP (last 3 results) No results for input(s): PROBNP in the last 8760 hours.  CBG:  Recent Labs Lab 01/26/15 2141 02/01/15 1656 02/01/15 2226 02/02/15 0752  GLUCAP 173* 310* 230* 137*    Recent Results (from the past 240 hour(s))  Blood Culture (routine x 2)     Status: None   Collection Time: 01/26/15  7:46 PM  Result Value Ref Range Status   Specimen Description BLOOD LEFT ASSIST CONTROL  Final   Special Requests BOTTLES DRAWN AEROBIC AND ANAEROBIC 5CC  Final   Culture  Setup Time   Final    GRAM POSITIVE COCCI IN CHAINS IN BOTH AEROBIC AND ANAEROBIC BOTTLES CRITICAL RESULT CALLED TO, READ BACK BY AND VERIFIED WITH: MYRA FLOWERS,RN AT 0800 01/27/2015 BY JRS    Culture   Final    STREPTOCOCCUS AGALACTIAE Virtually 100% of S. agalactiae (Group B) strains are susceptible to Penicillin.  For Penicillin-allergic patients, Erythromycin (85-95% sensitive) and Clindamycin (80% sensitive) are drugs of choice. Contact microbiology lab to request sensitivities if  needed within 7 days.    Report Status 01/29/2015 FINAL  Final   Organism ID, Bacteria STREPTOCOCCUS AGALACTIAE  Final      Susceptibility   Streptococcus  agalactiae - MIC*    VANCOMYCIN <=0.5 SENSITIVE Sensitive     CLINDAMYCIN <=0.25 RESISTANT Resistant     * STREPTOCOCCUS AGALACTIAE  Blood Culture (routine x 2)     Status: None   Collection Time: 01/26/15  7:46 PM  Result Value Ref Range Status   Specimen Description BLOOD LEFT HAND  Final   Special Requests BOTTLES DRAWN AEROBIC AND ANAEROBIC 5CC  Final   Culture  Setup Time   Final    GRAM POSITIVE COCCI IN CHAINS IN BOTH AEROBIC AND ANAEROBIC BOTTLES CRITICAL VALUE NOTED.  VALUE IS CONSISTENT WITH PREVIOUSLY REPORTED AND CALLED VALUE.    Culture   Final    STREPTOCOCCUS AGALACTIAE Virtually 100% of S. agalactiae (Group B) strains are susceptible to Penicillin.  For Penicillin-allergic patients, Erythromycin (85-95% sensitive) and Clindamycin (80% sensitive) are drugs of choice. Contact microbiology lab to request sensitivities if  needed within 7 days.    Report Status 01/29/2015 FINAL  Final   Organism ID, Bacteria STREPTOCOCCUS AGALACTIAE  Final      Susceptibility   Streptococcus agalactiae - MIC*    VANCOMYCIN <=0.5 SENSITIVE Sensitive     CLINDAMYCIN <=0.25 RESISTANT Resistant     *  STREPTOCOCCUS AGALACTIAE  MRSA PCR Screening     Status: None   Collection Time: 01/26/15 10:00 PM  Result Value Ref Range Status   MRSA by PCR NEGATIVE NEGATIVE Final    Comment:        The GeneXpert MRSA Assay (FDA approved for NASAL specimens only), is one component of a comprehensive MRSA colonization surveillance program. It is not intended to diagnose MRSA infection nor to guide or monitor treatment for MRSA infections.   Urine culture     Status: None   Collection Time: 01/27/15  4:39 AM  Result Value Ref Range Status   Specimen Description URINE, RANDOM  Final   Special Requests NONE  Final   Culture NO GROWTH 1 DAY  Final   Report Status 01/28/2015 FINAL  Final  Culture, expectorated sputum-assessment     Status: None   Collection Time: 01/28/15  3:05 PM  Result Value  Ref Range Status   Specimen Description SPUTUM  Final   Special Requests NONE  Final   Sputum evaluation THIS SPECIMEN IS ACCEPTABLE FOR SPUTUM CULTURE  Final   Report Status 01/28/2015 FINAL  Final  Culture, respiratory (NON-Expectorated)     Status: None   Collection Time: 01/28/15  3:05 PM  Result Value Ref Range Status   Specimen Description SPUTUM  Final   Special Requests NONE Reflexed from [place]  Final   Gram Stain   Final    EXCELLENT SPECIMEN - 90-100% WBCS MANY WBC SEEN MODERATE YEAST FEW GRAM NEGATIVE RODS    Culture MODERATE GROWTH CANDIDA SPECIES, NOT ALBICANS  Final   Report Status 01/31/2015 FINAL  Final     Studies: Mr Lumbar Spine W Wo Contrast  02/01/2015   CLINICAL DATA:  Diabetic patient with low back pain extending to both buttocks. Recent admission for multi lobar pneumonia. Sepsis with group B Streptococcus.  EXAM: MRI LUMBAR SPINE WITHOUT AND WITH CONTRAST  TECHNIQUE: Multiplanar and multiecho pulse sequences of the lumbar spine were obtained without and with intravenous contrast.  CONTRAST:  90mL MULTIHANCE GADOBENATE DIMEGLUMINE 529 MG/ML IV SOLN  COMPARISON:  Plain radiographs 01/28/2015 appear  unremarkable.  FINDINGS: Segmentation: Normal  Alignment: 3 mm anterolisthesis L3-4 is facet mediated. Alignment is otherwise anatomic.  Vertebrae: No worrisome osseous lesion.Abnormal edema and early endplate erosive change above and below the L4-5 interspace. This is accompanied by a T2 and STIR hyperintensity of that disc space along with a regional ventral epidural collection described below. Findings are concerning for osteomyelitis.  Conus medullaris: Normal in size, signal, and location.  Paraspinal tissues: No evidence for hydronephrosis or paravertebral mass. 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  Disc levels:  L1-L2:  Normal.  L2-L3: Central and leftward protrusion. Mild facet arthropathy. LEFT subarticular zone narrowing could affect the L3 nerve  root. No significant spinal stenosis.  L3-L4: 3 mm of facet mediated anterolisthesis. Broad-based central protrusion. Moderate facet and ligamentum flavum hypertrophy. Asymmetric enhancement and joint effusion of the LEFT L3-4 facet; LEFT septic arthritis is likely. LEFT subarticular zone narrowing could affect the LEFT L4 nerve root. Marked anterior osteophytic spurring.  L4-L5: T2 hyperintense disc with central protrusion. BILATERAL facet and ligamentum flavum hypertrophy. Enhancing ventral epidural collection, up to 6 mm thickness behind the L4 vertebral body, with mass effect on the ventral thecal sac and RIGHT greater than LEFT L5 nerve roots. BILATERAL foraminal narrowing, multifactorial, likely to affect the RIGHT greater than LEFT L4 nerve roots as well. Central nonenhancing collection likely representing  purulence. Epidural abscess is suspected.  L5-S1: Disc space narrowing. Mild facet arthropathy. No impingement.  IMPRESSION: Findings consistent with L4-5 diskitis, regional osteomyelitis, and shallow ventral epidural abscess behind the L4 vertebral body.  Multifactorial neural impingement at the L4-5 level related to disc material, posterior element hypertrophy, and enhancing ventral epidural soft tissue. RIGHT greater than LEFT L4 and L5 nerve root impingement.  3 mm of facet mediated slip L3-L4 with broad-based central protrusion. Posterior element hypertrophy with asymmetric enhancement and effusion of the LEFT L3-4 facet; these findings are most consistent with septic arthritis of that joint. LEFT L4 nerve root impingement is likely.  Findings discussed with ordering provider at time of interpretation.   Electronically Signed   By: Staci Righter M.D.   On: 02/01/2015 12:36    Scheduled Meds: . amLODipine  5 mg Oral QHS  . antiseptic oral rinse  7 mL Mouth Rinse BID  . atorvastatin  40 mg Oral QHS  . bisoprolol  10 mg Oral QHS  . budesonide  0.25 mg Nebulization BID  . carbidopa-levodopa  2  tablet Oral TID  . cefTRIAXone (ROCEPHIN)  IV  2 g Intravenous Q24H  . docusate sodium  100 mg Oral BID  . folic acid  1 mg Oral Daily  . insulin aspart  0-20 Units Subcutaneous TID WC  . insulin aspart  0-5 Units Subcutaneous QHS  . isosorbide mononitrate  30 mg Oral Daily  . lamoTRIgine  25 mg Oral BID  . levothyroxine  137 mcg Oral QAC breakfast  . losartan  100 mg Oral Daily  . mometasone-formoterol  2 puff Inhalation BID  . pantoprazole  40 mg Oral Daily  . PARoxetine  60 mg Oral Daily  . predniSONE  20 mg Oral Q breakfast  . sodium chloride  3 mL Intravenous Q12H  . tamsulosin  0.4 mg Oral Daily  . thiamine  100 mg Oral Daily  . tiotropium  18 mcg Inhalation Daily   Continuous Infusions:   Principal Problem:   Discitis of lumbar region Active Problems:   Multiple pulmonary nodules   Aspiration pneumonitis   Hypertension   Barrett's esophagus   CAD (coronary artery disease)   COPD (chronic obstructive pulmonary disease)   History of DVT (deep vein thrombosis)   Acute on chronic respiratory failure with hypoxia   Bacteremia due to group B Streptococcus   Antineutrophil cytoplasmic antibody (ANCA) positive   Goodpasture's disease   Epidural abscess   Warfarin anticoagulation   Parkinsons disease   Vertebral osteomyelitis   Dysphagia/chronic    Time spent: 30 mins    [facility] MD [facility] Pager [phone]. If 7PM-7AM, please contact night-coverage at [email or web address], password [email or web address] 02/02/2015, 12:37 PM  LOS: 1 day

## 2015-02-03 ENCOUNTER — Inpatient Hospital Stay (HOSPITAL_COMMUNITY): Payer: Medicare Other

## 2015-02-03 DIAGNOSIS — R1319 Other dysphagia: Secondary | ICD-10-CM

## 2015-02-03 DIAGNOSIS — R918 Other nonspecific abnormal finding of lung field: Secondary | ICD-10-CM

## 2015-02-03 LAB — CBC
HCT: 31.6 % — ABNORMAL LOW (ref 39.0–52.0)
HEMOGLOBIN: 9.6 g/dL — AB (ref 13.0–17.0)
MCH: 23.4 pg — ABNORMAL LOW (ref 26.0–34.0)
MCHC: 30.4 g/dL (ref 30.0–36.0)
MCV: 76.9 fL — AB (ref 78.0–100.0)
Platelets: 280 10*3/uL (ref 150–400)
RBC: 4.11 MIL/uL — AB (ref 4.22–5.81)
RDW: 17.8 % — ABNORMAL HIGH (ref 11.5–15.5)
WBC: 8.3 10*3/uL (ref 4.0–10.5)

## 2015-02-03 LAB — BASIC METABOLIC PANEL
Anion gap: 6 (ref 5–15)
BUN: 14 mg/dL (ref 6–20)
CHLORIDE: 93 mmol/L — AB (ref 101–111)
CO2: 37 mmol/L — ABNORMAL HIGH (ref 22–32)
Calcium: 9.2 mg/dL (ref 8.9–10.3)
Creatinine, Ser: 0.77 mg/dL (ref 0.61–1.24)
GFR calc Af Amer: >60 mL/min (ref >60)
GLUCOSE: 109 mg/dL — AB (ref 65–99)
POTASSIUM: 4.2 mmol/L (ref 3.5–5.1)
Sodium: 136 mmol/L (ref 135–145)

## 2015-02-03 LAB — MAGNESIUM: MAGNESIUM: 2 mg/dL (ref 1.7–2.4)

## 2015-02-03 LAB — GLUCOSE, CAPILLARY
GLUCOSE-CAPILLARY: 147 mg/dL — AB (ref 65–99)
GLUCOSE-CAPILLARY: 114 mg/dL — AB (ref 65–99)
GLUCOSE-CAPILLARY: 125 mg/dL — AB (ref 65–99)
Glucose-Capillary: 297 mg/dL — ABNORMAL HIGH (ref 65–99)

## 2015-02-03 LAB — HIV ANTIBODY (ROUTINE TESTING W REFLEX): HIV Screen 4th Generation wRfx: NONREACTIVE

## 2015-02-03 MED ORDER — MORPHINE SULFATE 2 MG/ML IJ SOLN
2.0000 mg | INTRAMUSCULAR | Status: DC | PRN
  Administered 2015-02-03 – 2015-02-08 (×22): 2 mg via INTRAVENOUS
  Filled 2015-02-03 (×23): qty 1

## 2015-02-03 MED ORDER — ENOXAPARIN SODIUM 80 MG/0.8ML ~~LOC~~ SOLN
80.0000 mg | Freq: Two times a day (BID) | SUBCUTANEOUS | Status: DC
  Administered 2015-02-04: 80 mg via SUBCUTANEOUS
  Filled 2015-02-03: qty 0.8

## 2015-02-03 MED ORDER — PREDNISONE 20 MG PO TABS
40.0000 mg | ORAL_TABLET | Freq: Every day | ORAL | Status: DC
  Administered 2015-02-03: 40 mg via ORAL

## 2015-02-03 MED ORDER — SODIUM CHLORIDE 0.9 % IV SOLN
1.0000 mg | Freq: Every day | INTRAVENOUS | Status: DC
  Administered 2015-02-04 – 2015-02-06 (×3): 1 mg via INTRAVENOUS
  Filled 2015-02-03 (×5): qty 0.2

## 2015-02-03 MED ORDER — PANTOPRAZOLE SODIUM 40 MG IV SOLR
40.0000 mg | INTRAVENOUS | Status: DC
  Administered 2015-02-04 – 2015-02-06 (×3): 40 mg via INTRAVENOUS
  Filled 2015-02-03 (×3): qty 40

## 2015-02-03 MED ORDER — FENTANYL 25 MCG/HR TD PT72
25.0000 ug | MEDICATED_PATCH | TRANSDERMAL | Status: DC
  Administered 2015-02-03: 25 ug via TRANSDERMAL
  Filled 2015-02-03: qty 1

## 2015-02-03 MED ORDER — SODIUM CHLORIDE 0.9 % IV SOLN
INTRAVENOUS | Status: DC
  Administered 2015-02-03 – 2015-02-07 (×4): via INTRAVENOUS
  Filled 2015-02-03: qty 1000

## 2015-02-03 MED ORDER — THIAMINE HCL 100 MG/ML IJ SOLN
100.0000 mg | Freq: Every day | INTRAMUSCULAR | Status: DC
  Administered 2015-02-04 – 2015-02-06 (×3): 100 mg via INTRAVENOUS
  Filled 2015-02-03 (×3): qty 2

## 2015-02-03 MED ORDER — METHYLPREDNISOLONE SODIUM SUCC 125 MG IJ SOLR
60.0000 mg | INTRAMUSCULAR | Status: DC
  Administered 2015-02-03 – 2015-02-05 (×3): 60 mg via INTRAVENOUS
  Filled 2015-02-03 (×3): qty 2

## 2015-02-03 MED ORDER — ENOXAPARIN SODIUM 80 MG/0.8ML ~~LOC~~ SOLN
80.0000 mg | Freq: Once | SUBCUTANEOUS | Status: AC
  Administered 2015-02-03: 80 mg via SUBCUTANEOUS
  Filled 2015-02-03: qty 0.8

## 2015-02-03 MED ORDER — LEVOTHYROXINE SODIUM 100 MCG IV SOLR
68.0000 ug | Freq: Every day | INTRAVENOUS | Status: DC
  Administered 2015-02-04 – 2015-02-06 (×3): 68 ug via INTRAVENOUS
  Filled 2015-02-03 (×4): qty 5

## 2015-02-03 MED ORDER — LORAZEPAM 2 MG/ML IJ SOLN
0.5000 mg | Freq: Three times a day (TID) | INTRAMUSCULAR | Status: DC | PRN
  Administered 2015-02-04 – 2015-02-06 (×5): 0.5 mg via INTRAVENOUS
  Filled 2015-02-03 (×5): qty 1

## 2015-02-03 NOTE — Progress Notes (Signed)
MBS report now available under imaging section of the notes.   Frannie Shedrick Sumney MA, CCC-SLP Acute Care Speech Language Pathologist    

## 2015-02-03 NOTE — Progress Notes (Signed)
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 Patient also on chronic prednisone secondary to Goodpasture's. Patient's pneumonia is improving. Patient has been seen in consultation by ID that  recommended holding aspiration because source of infection likely consistent with group B streptococcus bacteremia. Repeat blood cultures were obtained and are pending. Continue IV Rocephin for at least 6-8 weeks per ID recommendations. Patient needs to follow-up with ID as outpatient.  #2 GpB  streptococcus bacteremia Continue IV Rocephin.  #3 Pneumonia Patient with recent pneumonia approximately week prior to admission. Medical improvement. On IV Rocephin. Continue oxygen, nebulizers.  #4 COPD Stable. Continue Spiriva. Continue dulera.  #5 hypertension Patient blood pressure was borderline this morning and a such anti-hypertensive medications have been discontinued.  #6 coronary artery disease Stable. Continue home regimen of medications. We'll change some of his medications to IV.  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 However due to patient's recent diagnosis of Parkinson's will need to keep a close eye  on patient's balance and carefully evaluate his fall risk. Patients INR was reversed in anticipation of possible interventional radiological procedure which per ID note is not needed at this time. We'll place patient on full dose Lovenox for now while awaiting PEG tube placement.  #8 Parkinson's Disease Continue sinemet.  #9 hypothyroidism Change Synthroid to IV.  #10 history of multiple pulmonary nodules Follow up in the outpatient setting by pulmonary.  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  #12 dysphagia Speech therapy has assessed the patient and patient underwent modified barium swallow and failed. Speech therapy recommending nothing by mouth status. Will as IR to place a PEG tube. Will try to change most medications to IV.  #13 prophylaxis SCDs for DVT prophylaxis.  Code Status: Full Family Communication: Updated patient at bedside. No family present. Disposition Plan: Home vs SNF when medically stable.   Consultants:  Infectious diseases: Dr. Linus Salmons 02/02/2015  Procedures:  MRI lumbar spine 02/01/2015  MBS  Antibiotics:  IV Rocephin 02/02/2015  HPI/Subjective: Patient complaining of back pain. No chest pain. No shortness of breath.  Objective: Filed Vitals:   02/03/15 1416  BP: 132/60  Pulse: 72  Temp: 98.7 F (37.1 C)  Resp: 22    Intake/Output Summary (Last 24 hours) at 02/03/15 1437 Last data filed at 02/03/15 1407  Gross per 24 hour  Intake    170 ml  Output   1150 ml  Net   -980 ml   Filed Weights   02/02/15 0523 02/03/15 0608  Weight: 91.2 kg (201 lb 1 oz) 84.959 kg (187 lb 4.8 oz)    Exam:   General:  NAD  Cardiovascular: RRR  Respiratory: CTAB  Abdomen: Soft, nontender, nondistended, positive bowel sounds.  Musculoskeletal: No clubbing cyanosis or edema.  Data Reviewed: Basic  Metabolic Panel:  Recent Labs Lab 01/30/15 0410 01/31/15 0334  02/01/15 1737 02/02/15 0557 02/03/15 0510  NA 137 139 134* 136 136  K 3.9 4.2 5.1 4.2 4.2  CL 99* 96* 92* 97* 93*  CO2 32 36* 36* 32 37*  GLUCOSE 164* 149* 291* 138* 109*  BUN 14 13 19 15 14   CREATININE 0.68 0.67 0.91 0.70 0.77  CALCIUM 8.8* 9.0 9.2 9.1 9.2  MG  --   --   --   --  2.0   Liver Function Tests:  Recent Labs Lab 01/28/15 0454 02/01/15 1737  AST 22 18  ALT <5* 15*  ALKPHOS 66 60  BILITOT 0.4 0.6  PROT 6.4* 6.2*  ALBUMIN 2.9* 2.5*   No results for input(s): LIPASE, AMYLASE in the last 168 hours. No results for input(s): AMMONIA in the last 168 hours. CBC:  Recent Labs Lab 01/29/15 0506 01/30/15 0410 02/01/15 1737 02/02/15 0557 02/03/15 0510  WBC 11.3* 10.1 8.6 8.2 8.3  NEUTROABS  --   --  8.1*  --   --   HGB 9.4* 9.2* 9.4* 9.9* 9.6*  HCT 30.4* 29.7* 30.3* 31.8* 31.6*  MCV 76.0* 75.4* 76.1* 76.8* 76.9*  PLT 201 204 261 262 280   Cardiac Enzymes: No results for input(s): CKTOTAL, CKMB, CKMBINDEX, TROPONINI in the last 168 hours. BNP (last 3 results)  Recent Labs  01/26/15 1946  BNP 420.0*    ProBNP (last 3 results) No results for input(s): PROBNP in the last 8760 hours.  CBG:  Recent Labs Lab 02/02/15 1227 02/02/15 1722 02/02/15 2132 02/03/15 0801 02/03/15 1215  GLUCAP 193* 190* 207* 147* 114*    Recent Results (from the past 240 hour(s))  Blood Culture (routine x 2)     Status: None   Collection Time: 01/26/15  7:46 PM  Result Value Ref Range Status   Specimen Description BLOOD LEFT ASSIST CONTROL  Final   Special Requests BOTTLES DRAWN AEROBIC AND ANAEROBIC 5CC  Final   Culture  Setup Time   Final    GRAM POSITIVE COCCI IN CHAINS IN BOTH AEROBIC AND ANAEROBIC BOTTLES CRITICAL RESULT CALLED TO, READ BACK BY AND VERIFIED WITH: MYRA FLOWERS,RN AT 0800 01/27/2015 BY JRS    Culture   Final    STREPTOCOCCUS AGALACTIAE Virtually 100% of S. agalactiae (Group B) strains are susceptible to Penicillin.  For Penicillin-allergic  patients, Erythromycin (85-95% sensitive) and Clindamycin (80% sensitive) are drugs of choice. Contact microbiology lab to request sensitivities if  needed within 7 days.    Report Status 01/29/2015 FINAL  Final   Organism ID, Bacteria STREPTOCOCCUS AGALACTIAE  Final      Susceptibility   Streptococcus agalactiae - MIC*    VANCOMYCIN <=0.5 SENSITIVE Sensitive     CLINDAMYCIN <=0.25 RESISTANT Resistant     * STREPTOCOCCUS AGALACTIAE  Blood Culture (routine x 2)     Status: None   Collection Time: 01/26/15  7:46 PM  Result Value Ref Range Status   Specimen Description BLOOD LEFT HAND  Final   Special Requests BOTTLES DRAWN AEROBIC AND ANAEROBIC 5CC  Final   Culture  Setup Time   Final    GRAM POSITIVE COCCI IN CHAINS IN BOTH AEROBIC AND ANAEROBIC BOTTLES CRITICAL VALUE NOTED.  VALUE IS CONSISTENT WITH PREVIOUSLY REPORTED AND CALLED VALUE.    Culture   Final    STREPTOCOCCUS AGALACTIAE Virtually 100% of S. agalactiae (Group B) strains are susceptible to Penicillin.  For Penicillin-allergic patients, Erythromycin (85-95% sensitive) and  Clindamycin (80% sensitive) are drugs of choice. Contact microbiology lab to request sensitivities if  needed within 7 days.    Report Status 01/29/2015 FINAL  Final   Organism ID, Bacteria STREPTOCOCCUS AGALACTIAE  Final      Susceptibility   Streptococcus agalactiae - MIC*    VANCOMYCIN <=0.5 SENSITIVE Sensitive     CLINDAMYCIN <=0.25 RESISTANT Resistant     * STREPTOCOCCUS AGALACTIAE  MRSA PCR Screening     Status: None   Collection Time: 01/26/15 10:00 PM  Result Value Ref Range Status   MRSA by PCR NEGATIVE NEGATIVE Final    Comment:        The GeneXpert MRSA Assay (FDA approved for NASAL specimens only), is one component of a comprehensive MRSA colonization surveillance program. It is not intended to diagnose MRSA infection nor to guide or monitor treatment for MRSA infections.   Urine culture     Status: None   Collection Time:  01/27/15  4:39 AM  Result Value Ref Range Status   Specimen Description URINE, RANDOM  Final   Special Requests NONE  Final   Culture NO GROWTH 1 DAY  Final   Report Status 01/28/2015 FINAL  Final  Culture, expectorated sputum-assessment     Status: None   Collection Time: 01/28/15  3:05 PM  Result Value Ref Range Status   Specimen Description SPUTUM  Final   Special Requests NONE  Final   Sputum evaluation THIS SPECIMEN IS ACCEPTABLE FOR SPUTUM CULTURE  Final   Report Status 01/28/2015 FINAL  Final  Culture, respiratory (NON-Expectorated)     Status: None   Collection Time: 01/28/15  3:05 PM  Result Value Ref Range Status   Specimen Description SPUTUM  Final   Special Requests NONE Reflexed from [place]  Final   Gram Stain   Final    EXCELLENT SPECIMEN - 90-100% WBCS MANY WBC SEEN MODERATE YEAST FEW GRAM NEGATIVE RODS    Culture MODERATE GROWTH CANDIDA SPECIES, NOT ALBICANS  Final   Report Status 01/31/2015 FINAL  Final  Culture, blood (routine x 2)     Status: None (Preliminary result)   Collection Time: 02/01/15  5:46 PM  Result Value Ref Range Status   Specimen Description BLOOD RIGHT ANTECUBITAL  Final   Special Requests BOTTLES DRAWN AEROBIC AND ANAEROBIC 10CC EACH  Final   Culture NO GROWTH 2 DAYS  Final   Report Status PENDING  Incomplete  Culture, blood (routine x 2)     Status: None (Preliminary result)   Collection Time: 02/01/15  5:55 PM  Result Value Ref Range Status   Specimen Description BLOOD LEFT ANTECUBITAL  Final   Special Requests BOTTLES DRAWN AEROBIC AND ANAEROBIC 10CC EACH  Final   Culture NO GROWTH 2 DAYS  Final   Report Status PENDING  Incomplete     Studies: Dg Swallowing Func-speech Pathology  02/03/2015    Objective Swallowing Evaluation:    Patient Details  Name: COLEN ELTZROTH MRN: [id number] Date of Birth: Dec 02, 1945  Today's Date: 02/03/2015 Time: SLP Start Time (ACUTE ONLY): 1100-SLP Stop Time (ACUTE ONLY): 1134 SLP Time Calculation (min) (ACUTE  ONLY): 34 min  Past Medical History:  Past Medical History  Diagnosis Date  . Barrett esophagus     "pretty bad" (02/01/2015)  . Hypertension   . Hyperlipidemia   . Multiple pulmonary nodules   . CHF (congestive heart failure)   . Lumbar spinal stenosis   . COPD (chronic obstructive pulmonary disease)   .  CAD (coronary artery disease)   . On home oxygen therapy     "2L unless I'm outside" (02/01/2015)  . DVT (deep venous thrombosis) 11/2013; 02/01/2015    [name] 01/18/2014; "got one behind my right knee now" (02/01/2015)  . Parkinson's disease     /notes 08/15/2014  . Recurrent aspiration pneumonia   . Sleep apnea     "gave machine back; couldn't use it" (02/01/2015)  . Hypothyroidism   . Type II diabetes mellitus   . Iron deficiency anemia     "real bad" (02/01/2015)  . GERD (gastroesophageal reflux disease)   . Seizures     "don't know what they are from; last one was ~ 4 months ago" (02/01/2015)   . DJD (degenerative joint disease)     [name] 08/15/2014  . Arthritis     "real bad in my back" (02/01/2015)  . Chronic lower back pain   . History of gout   . Depression     "maybe a touch before I started taking seizure RX" (02/01/2015)  . Anxiety   . Basal cell carcinoma     "all over my face and body"   . Throat cancer     [name] 08/15/2014  . Heart attack 03/20/1995   Past Surgical History:  Past Surgical History  Procedure Laterality Date  . Anterior cervical decomp/discectomy fusion  X 2  . Radical neck dissection Right ~ 1998    "throat cancer"  . Total knee arthroplasty Right 2011  . Thyroidectomy  ~ 1996 X 2    "took 1/2 out at a time"  . Cataract extraction w/ intraocular lens  implant, bilateral Bilateral   . Tonsillectomy    . Excisional hemorrhoidectomy    . Joint replacement    . Back surgery    . Posterior fusion cervical spine  X 1  . Coronary angioplasty with stent placement      "I've got at least 3" (02/01/2015)  . Coronary artery bypass graft  1996    "CABG X3"  . Basal cell carcinoma excision      "all over my face  and body"    HPI:  Other Pertinent Information: This is a 69 year old male patient with  multiple medical problems including Goodpasture's disease with positive  ANCA on chronic prednisone, Barrett's esophagitis, hypertension, COPD on  chronic oxygen 2 L/m, Parkinson's disease, CAD, multiple pulmonary  nodules, history of DVT June 2015. Patient was recently admitted to  [facility] on 7/22 secondary to sepsis from aspiration  pneumonitis and later found to have group B streptococcal bacteremia. MRI  of the lumbar spine without contrast revealed L4-L5 discitis and vertebral  osteomyelitis with epidural abscess. Pt has a h/o chronic dysphagia s/p  XRT and surgical intervention for throat cancer, s/p ACDF, and possibly  impacted by PD. Pt has had previous swallowing evaluations at outside  facilities, most recently recommending nectar thick liquids.  No Data Recorded  Assessment / Plan / Recommendation CHL IP CLINICAL IMPRESSIONS 02/03/2015  Therapy Diagnosis Moderate to severe pharyngeal phase dysphagia;Moderate   oral phase dysphagia  Clinical Impression Pt with chronic dysphagia which is suspected to be  largely impacted from history of throat cancer and negative changes  following radiation therapy course.  Pt with moderate to severe  oropharyngeal dysphagia: epiglottic inversion very limited, poor laryngeal  elevation, and poor base of tongue retraction which lead to aspiration of  all consistencies seen both during and after the swallow. Pts swallow  function  with limited clearance of bolus, despite multiple swallow  efforts. Moderate amount of residuals remained in the valleculae, pyriform  sinuses, and lateral channels which ended up spilling into laryngeal  vestibule and eventually passed below true vocal cords.  Sensation of  aspirate events not consistent and cough was ineffective of completely  clearing airway. Consulted with MD as pt with recurrent aspiration PNA and  not safe to consume PO.  Recommend NPO with nutrition and medicines via  alternative means. ST to follow up       CHL IP TREATMENT RECOMMENDATION 02/03/2015  Treatment Recommendations Therapy as outlined in treatment plan below     CHL IP DIET RECOMMENDATION 02/03/2015  SLP Diet Recommendations NPO;Alternative means - long-term  Liquid Administration via (None)  Medication Administration (None)  Compensations (None)  Postural Changes and/or Swallow Maneuvers (None)     CHL IP OTHER RECOMMENDATIONS 02/03/2015  Recommended Consults (None)  Oral Care Recommendations Oral care QID  Other Recommendations (None)     No flowsheet data found.   CHL IP FREQUENCY AND DURATION 02/03/2015  Speech Therapy Frequency (ACUTE ONLY) min 2x/week  Treatment Duration 2 weeks     Pertinent Vitals/Pain     SLP Swallow Goals No flowsheet data found.  No flowsheet data found.    CHL IP REASON FOR REFERRAL 02/03/2015  Reason for Referral Objectively evaluate swallowing function            No flowsheet data found.  No flowsheet data found.  Arvil Chaco MA, CCC-SLP Acute Care Speech Language Pathologist          Levi Aland 02/03/2015, 12:19 PM     Scheduled Meds: . antiseptic oral rinse  7 mL Mouth Rinse BID  . budesonide  0.25 mg Nebulization BID  . carbidopa-levodopa  2 tablet Oral TID  . cefTRIAXone (ROCEPHIN)  IV  2 g Intravenous Q24H  . docusate sodium  100 mg Oral BID  . folic acid (FOLVITE) IVPB  1 mg Intravenous Daily  . insulin aspart  0-20 Units Subcutaneous TID WC  . insulin aspart  0-5 Units Subcutaneous QHS  . lamoTRIgine  25 mg Oral BID  . levothyroxine  68 mcg Intravenous Daily  . methylPREDNISolone (SOLU-MEDROL) injection  60 mg Intravenous Q24H  . mometasone-formoterol  2 puff Inhalation BID  . pantoprazole (PROTONIX) IV  40 mg Intravenous Q24H  . PARoxetine  60 mg Oral Daily  . sodium chloride  3 mL Intravenous Q12H  . tamsulosin  0.4 mg Oral Daily  . thiamine IV  100 mg Intravenous Daily  . tiotropium  18 mcg Inhalation  Daily   Continuous Infusions: . sodium chloride 0.9 % 1,000 mL infusion 75 mL/hr at 02/03/15 1024    Principal Problem:   Discitis of lumbar region Active Problems:   Multiple pulmonary nodules   Aspiration pneumonitis   Hypertension   Barrett's esophagus   CAD (coronary artery disease)   COPD (chronic obstructive pulmonary disease)   History of DVT (deep vein thrombosis)   Acute on chronic respiratory failure with hypoxia   Bacteremia due to group B Streptococcus   Antineutrophil cytoplasmic antibody (ANCA) positive   Goodpasture's disease   Epidural abscess   Warfarin anticoagulation   Parkinsons disease   Vertebral osteomyelitis   Dysphagia/chronic   Diskitis   Dysphagia causing pulmonary aspiration with swallowing    Time spent: 30 mins    [facility] MD [facility] Pager [phone]. If 7PM-7AM, please contact night-coverage at [email or web address], password [facility]  02/03/2015, 2:37 PM  LOS: 2 days            Other

## 2015-02-03 NOTE — Progress Notes (Signed)
ANTICOAGULATION CONSULT NOTE - Initial Consult  Pharmacy Consult:  Lovenox Indication:  History of DVT  Allergies  Allergen Reactions  . Other Other (See Comments)    Dizziness, lightheadedness, Pt states that inhaled medications make him depressed and have negative thoughts.      Patient Measurements: Weight: 187 lb 4.8 oz (84.959 kg)  Vital Signs: Temp: 98.7 F (37.1 C) (07/30 1416) Temp Source: Oral (07/30 1416) BP: 132/60 mmHg (07/30 1416) Pulse Rate: 72 (07/30 1416)  Labs:  Recent Labs  02/01/15 0338  02/01/15 1737 02/02/15 0557 02/03/15 0510  HGB  --   < > 9.4* 9.9* 9.6*  HCT  --   --  30.3* 31.8* 31.6*  PLT  --   --  261 262 280  LABPROT 25.3*  --   --  18.7*  --   INR 2.28  --   --  1.56*  --   CREATININE  --   --  0.91 0.70 0.77  < > = values in this interval not displayed.  Estimated Creatinine Clearance: 93.4 mL/min (by C-G formula based on Cr of 0.77).   Medical History: Past Medical History  Diagnosis Date  . Barrett esophagus     "pretty bad" (02/01/2015)  . Hypertension   . Hyperlipidemia   . Multiple pulmonary nodules   . CHF (congestive heart failure)   . Lumbar spinal stenosis   . COPD (chronic obstructive pulmonary disease)   . CAD (coronary artery disease)   . On home oxygen therapy     "2L unless I'm outside" (02/01/2015)  . DVT (deep venous thrombosis) 11/2013; 02/01/2015    [name] 01/18/2014; "got one behind my right knee now" (02/01/2015)  . Parkinson's disease     /notes 08/15/2014  . Recurrent aspiration pneumonia   . Sleep apnea     "gave machine back; couldn't use it" (02/01/2015)  . Hypothyroidism   . Type II diabetes mellitus   . Iron deficiency anemia     "real bad" (02/01/2015)  . GERD (gastroesophageal reflux disease)   . Seizures     "don't know what they are from; last one was ~ 4 months ago" (02/01/2015)  . DJD (degenerative joint disease)     [name] 08/15/2014  . Arthritis     "real bad in my back" (02/01/2015)  . Chronic  lower back pain   . History of gout   . Depression     "maybe a touch before I started taking seizure RX" (02/01/2015)  . Anxiety   . Basal cell carcinoma     "all over my face and body"   . Throat cancer     [name] 08/15/2014  . Heart attack 03/20/1995      Assessment: 79 YOM with history of DVT in June 2015 on Coumadin PTA.  INR was reversed on admit for potential IR procedure.  Patient to transition to Lovenox as IR procedure is unable to be performed in setting of bacteremia.  INR sub-therapeutic; no bleeding reported.  Patient renal function has been stable.   Goal of Therapy:  Anti-Xa level 0.6-1 units/ml 4hrs after LMWH dose given Monitor platelets by anticoagulation protocol: Yes     Plan:  - Lovenox 80mg  SQ Q12H - CBC Q72H while on Lovenox - Monitor renal fxn    Olden Klauer D. Mina Marble, PharmD, BCPS Pager:  [phone] 02/03/2015, 3:10 PM

## 2015-02-04 ENCOUNTER — Encounter (HOSPITAL_COMMUNITY): Payer: Self-pay | Admitting: Physician Assistant

## 2015-02-04 DIAGNOSIS — I10 Essential (primary) hypertension: Secondary | ICD-10-CM

## 2015-02-04 LAB — GLUCOSE, CAPILLARY
GLUCOSE-CAPILLARY: 130 mg/dL — AB (ref 65–99)
GLUCOSE-CAPILLARY: 126 mg/dL — AB (ref 65–99)
Glucose-Capillary: 128 mg/dL — ABNORMAL HIGH (ref 65–99)
Glucose-Capillary: 154 mg/dL — ABNORMAL HIGH (ref 65–99)

## 2015-02-04 LAB — BASIC METABOLIC PANEL
Anion gap: 8 (ref 5–15)
BUN: 21 mg/dL — ABNORMAL HIGH (ref 6–20)
CO2: 30 mmol/L (ref 22–32)
Calcium: 9.1 mg/dL (ref 8.9–10.3)
Chloride: 98 mmol/L — ABNORMAL LOW (ref 101–111)
Creatinine, Ser: 0.77 mg/dL (ref 0.61–1.24)
Glucose, Bld: 129 mg/dL — ABNORMAL HIGH (ref 65–99)
POTASSIUM: 5.6 mmol/L — AB (ref 3.5–5.1)
Sodium: 136 mmol/L (ref 135–145)

## 2015-02-04 LAB — CBC
HEMATOCRIT: 36 % — AB (ref 39.0–52.0)
HEMOGLOBIN: 11.8 g/dL — AB (ref 13.0–17.0)
MCH: 24.8 pg — AB (ref 26.0–34.0)
MCHC: 32.8 g/dL (ref 30.0–36.0)
MCV: 75.8 fL — AB (ref 78.0–100.0)
Platelets: ADEQUATE 10*3/uL (ref 150–400)
RBC: 4.75 MIL/uL (ref 4.22–5.81)
RDW: 18.1 % — AB (ref 11.5–15.5)
WBC: 8.2 10*3/uL (ref 4.0–10.5)

## 2015-02-04 LAB — POTASSIUM: Potassium: 4.4 mmol/L (ref 3.5–5.1)

## 2015-02-04 MED ORDER — FENTANYL 50 MCG/HR TD PT72
50.0000 ug | MEDICATED_PATCH | TRANSDERMAL | Status: DC
  Administered 2015-02-04 – 2015-02-07 (×2): 50 ug via TRANSDERMAL
  Filled 2015-02-04 (×2): qty 1

## 2015-02-04 MED ORDER — ENOXAPARIN SODIUM 80 MG/0.8ML ~~LOC~~ SOLN: 80.0000 mg | Freq: Two times a day (BID) | SUBCUTANEOUS | Status: DC

## 2015-02-04 MED ORDER — METOPROLOL TARTRATE 1 MG/ML IV SOLN
5.0000 mg | Freq: Three times a day (TID) | INTRAVENOUS | Status: DC
  Administered 2015-02-04 – 2015-02-07 (×10): 5 mg via INTRAVENOUS
  Filled 2015-02-04 (×10): qty 5

## 2015-02-04 NOTE — Evaluation (Signed)
Occupational Therapy Evaluation Patient Details Name: John GAGLIO MRN: [id number] DOB: 1945-12-18 Today's Date: 02/04/2015    History of Present Illness Pt is a 69 y.o. M admitted to [facility] on 7/22 2/2 sepsis from aspiration pneumonitis and later found to have B strep bacteremia.  MRI revealed L4-5 discitis and vertebral osteomyelitis w/ epidural abscess.  Pt's PMH includes Barrett esophagus, HTN, CHF, lumbar spinal stenosis, COPD, CAD, DVT, Parkinson's, DMII, anemia, seizures, chronic LBP, depression, gout, anxiety, anterior cervical decompression/discectomy fusion, R TKA.   Clinical Impression   Pt admitted with above. Pt independent with ADLs, PTA. Feel pt will benefit from acute OT to increase independence and strength prior to d/c.    Follow Up Recommendations  No OT follow up;Supervision - Intermittent    Equipment Recommendations  None recommended by OT    Recommendations for Other Services       Precautions / Restrictions Precautions Precautions: Fall Precaution Comments: educated pt on and practiced back precautions and log roll to dec back pain Restrictions Weight Bearing Restrictions: No      Mobility Bed Mobility Overal bed mobility: Modified Independent             General bed mobility comments: Suggested log rolling. Pt used log roll technique to get out of bed. Pt performed sit to supine technique to return to bed.  Transfers Overall transfer level: Needs assistance Transfers: Sit to/from Stand Sit to Stand: Min guard;Supervision            Balance Min guard for ambulation.                            ADL Overall ADL's : Needs assistance/impaired Eating/Feeding: NPO;Sitting (pt swished liquid in mouth and spit out)   Grooming: Wash/dry hands;Standing;Set up;Supervision/safety               Lower Body Dressing: Sit to/from stand;Set up;Supervision/safety   Toilet Transfer: Ambulation;Regular Toilet;Min guard   Toileting-  Clothing Manipulation and Hygiene: Sit to/from stand;Supervision/safety       Functional mobility during ADLs: Min guard General ADL Comments: Educated on energy conservation as wife reports pt gets fatigued at home.     Vision     Perception     Praxis      Pertinent Vitals/Pain Pain Assessment: 0-10 Pain Score: 9  (when ambulating) Pain Location: back Pain Descriptors / Indicators: Aching Pain Intervention(s): Monitored during session     Hand Dominance     Extremity/Trunk Assessment Upper Extremity Assessment Upper Extremity Assessment: Generalized weakness;LUE deficits/detail LUE Deficits / Details: limited ROM with left shoulder flexion   Lower Extremity Assessment Lower Extremity Assessment: Defer to PT evaluation RLE Deficits / Details: pt reports tingling in toes; however dermatomal testing WNL LLE Deficits / Details: pt reports tingling in toes; however dermatomal testing WNL     Communication Communication Communication: No difficulties   Cognition Arousal/Alertness: Awake/alert Behavior During Therapy: WFL for tasks assessed/performed Overall Cognitive Status: Within Functional Limits for tasks assessed                     General Comments       Exercises       Shoulder Instructions      Home Living Family/patient expects to be discharged to:: Private residence Living Arrangements: Spouse/significant other Available Help at Discharge: Family;Available PRN/intermittently (son lives nearby) Type of Home: House Home Access: Stairs to enter [employer] of Steps:  3 Entrance Stairs-Rails: Right Home Layout: One level     Bathroom Shower/Tub: Tub/shower unit;Walk-in shower         Home Equipment: Kasandra Knudsen - single point;Walker - 2 wheels;Bedside commode;Shower seat;Shower seat - built in          Prior Functioning/Environment Level of Independence: Independent        Comments: Intermittent use of SPC "when back hurts".     OT Diagnosis: Generalized weakness;Acute pain   OT Problem List: Decreased knowledge of use of DME or AE;Decreased strength;Decreased activity tolerance;Pain;Decreased knowledge of precautions   OT Treatment/Interventions: Self-care/ADL training;Balance training;Patient/family education;Therapeutic activities;DME and/or AE instruction;Therapeutic exercise;Energy conservation    OT Goals(Current goals can be found in the care plan section) Acute Rehab OT Goals Patient Stated Goal: not stated OT Goal Formulation: With patient/family Time For Goal Achievement: 02/11/15 Potential to Achieve Goals: Good ADL Goals Pt Will Perform Lower Body Bathing: with modified independence;sit to/from stand Pt Will Perform Lower Body Dressing: with modified independence;sit to/from stand Pt Will Transfer to Toilet: with modified independence;ambulating Additional ADL Goal #1: Pt will independently perform HEP for bilateral UEs to increase strength.  OT Frequency: Min 2X/week   Barriers to D/C:            Co-evaluation              End of Session Equipment Utilized During Treatment: Gait belt;Other (comment) (placed O2 back on pt at end of session; ambulated without O2)  Activity Tolerance: Patient tolerated treatment well Patient left: in bed;with call bell/phone within reach;with family/visitor present   Time: 6237-6283 OT Time Calculation (min): 18 min Charges:  OT General Charges $OT Visit: 1 Procedure OT Evaluation $Initial OT Evaluation Tier I: 1 Procedure G-CodesBenito Mccreedy OTR/L [id number] 02/04/2015, 4:46 PM

## 2015-02-04 NOTE — Evaluation (Signed)
Physical Therapy Evaluation Patient Details Name: John Mendoza MRN: [id number] DOB: [birth year] Today's Date: 02/04/2015   History of Present Illness  Pt is a 69 y/o M admitted to [facility] on 7/22 2/2 sepsis from aspiration pneumonitis and later found to have B strep bacteremia.  MRI revealed L4-5 discitis and vertebral osteomyelitis w/ epidural abscess.  Pt's PMH includes Barrett esophagus, HTN, CHF, lumbar spinal stenosis, COPD, CAD, DVT, Parkinson's, DMII, anemia, seizures, chronic LBP, depression, gout, anxiety, anterior cervical decom/discectomy fusion, R TKA.  Clinical Impression  Pt admitted with above diagnosis. Pt currently with functional limitations due to the deficits listed below (see PT Problem List). Pt lives at home w/ wife who is unable to provide assist, recommending HHPT upon d/c to address instability w/ ambulation.  Min guard assist for sit<>stand and ambulation this session.  Recommend gait training w/ SPC at next session as appropriate. Pt will benefit from skilled PT to increase their independence and safety with mobility to allow discharge to the venue listed below.     Follow Up Recommendations Home health PT    Equipment Recommendations  None recommended by PT    Recommendations for Other Services       Precautions / Restrictions Precautions Precautions: Fall Precaution Comments: educated pt on and practiced back precautions and log roll to dec back pain Restrictions Weight Bearing Restrictions: No      Mobility  Bed Mobility Overal bed mobility: Modified Independent             General bed mobility comments: Pt performed log roll w/ good technique, no physical assist or VCs provided  Transfers Overall transfer level: Needs assistance Equipment used: None Transfers: Sit to/from Stand Sit to Stand: Min guard         General transfer comment: Min guard for safety, mild instability noted.  Ambulation/Gait Ambulation/Gait assistance: Min  guard Ambulation Distance (Feet): 125 Feet Assistive device: None Gait Pattern/deviations: Step-through pattern;Antalgic;Decreased stride length   Gait velocity interpretation: Below normal speed for age/gender General Gait Details: Slow gait speed and required standing rest break x2 2/2 fatigue.    Stairs            Wheelchair Mobility    Modified Rankin (Stroke Patients Only)       Balance Overall balance assessment: Needs assistance Sitting-balance support: No upper extremity supported;Feet supported Sitting balance-Leahy Scale: Good     Standing balance support: During functional activity;No upper extremity supported Standing balance-Leahy Scale: Fair                               Pertinent Vitals/Pain Pain Assessment: 0-10 Pain Score: 3  Pain Location: back Pain Descriptors / Indicators: Aching Pain Intervention(s): Limited activity within patient's tolerance;Monitored during session;Repositioned    Home Living Family/patient expects to be discharged to:: Private residence Living Arrangements: Spouse/significant other Available Help at Discharge: Family;Available PRN/intermittently (son lives nearby) Type of Home: House Home Access: Stairs to enter Entrance Stairs-Rails: Right Entrance Stairs-Number of Steps: 3 Home Layout: One level Home Equipment: Cane - single point;Walker - 2 wheels      Prior Function Level of Independence: Independent         Comments: Intermittent use of SPC "when back hurts".     Hand Dominance        Extremity/Trunk Assessment   Upper Extremity Assessment: Overall WFL for tasks assessed           Lower  Extremity Assessment: Generalized weakness RLE Deficits / Details: pt reports tingling in toes; however dermatomal testing WNL LLE Deficits / Details: pt reports tingling in toes; however dermatomal testing WNL  Cervical / Trunk Assessment: Kyphotic  Communication   Communication: No difficulties   Cognition Arousal/Alertness: Awake/alert Behavior During Therapy: WFL for tasks assessed/performed Overall Cognitive Status: Within Functional Limits for tasks assessed                      General Comments      Exercises        Assessment/Plan    PT Assessment Patient needs continued PT services  PT Diagnosis Difficulty walking;Abnormality of gait;Generalized weakness;Acute pain   PT Problem List Decreased strength;Decreased range of motion;Decreased activity tolerance;Decreased balance;Decreased mobility;Decreased coordination;Decreased knowledge of use of DME;Decreased safety awareness;Decreased knowledge of precautions;Pain;Cardiopulmonary status limiting activity  PT Treatment Interventions DME instruction;Gait training;Functional mobility training;Stair training;Therapeutic activities;Therapeutic exercise;Balance training;Neuromuscular re-education;Patient/family education   PT Goals (Current goals can be found in the Care Plan section) Acute Rehab PT Goals Patient Stated Goal: to get better so he can go home and drink coffee on his back porch PT Goal Formulation: With patient Time For Goal Achievement: 02/18/15 Potential to Achieve Goals: Good    Frequency Min 2X/week   Barriers to discharge Inaccessible home environment;Decreased caregiver support Intermittent assist available at d/c, 3 steps to enter home    Co-evaluation               End of Session Equipment Utilized During Treatment: Gait belt Activity Tolerance: Patient limited by fatigue Patient left: in bed;with call bell/phone within reach;with family/visitor present Nurse Communication: Mobility status;Other (comment);Precautions (weight: 184.9 lbs)         Time: 3500-9381 PT Time Calculation (min) (ACUTE ONLY): 24 min   Charges:   PT Evaluation $Initial PT Evaluation Tier I: 1 Procedure PT Treatments $Gait Training: 8-22 mins   PT G CodesJoslyn Mendoza PT, DPT  [phone] Pager: [phone] 02/04/2015, 4:15 PM

## 2015-02-04 NOTE — Plan of Care (Signed)
Problem: Phase I Progression Outcomes Goal: Pain controlled with appropriate interventions Outcome: Progressing Pain medications have been provided to this patient per request and scheduled time.  Patient has also been repositioned in the bed and chair in order to relieve pain.

## 2015-02-04 NOTE — H&P (Signed)
Chief Complaint: Patient was seen in consultation today for placement of percutaneous gastrostomy tube placement at the request of John Mendoza  Referring Physician(s): John Mendoza  History of Present Illness: John Mendoza is a 69 y.o. 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 Lake Bells with pulmonology (outpatient) as well as a history of radical neck dissection for throat cancer.   He was recently admitted to [facility] on 7/22 secondary to sepsis from aspiration pneumonitis and later found to have group B streptococcal bacteremia.   He has failed his swallow evaluation and we are asked to place a gastrostomy tube.  Also, when he was initially admitted he c/o back pain.  MRI of the lumbar spine without contrast revealed L4-L5 discitis and vertebral osteomyelitis with epidural abscess.   His most recent blood cultures show no growth to date.  He has remained afebrile for a few days now.  His WBCs are normal.  He was previously on warfarin for DVT in June 2015 and is currently on Lovenox.  Past Medical History  Diagnosis Date  . Barrett esophagus     "pretty bad" (02/01/2015)  . Hypertension   . Hyperlipidemia   . Multiple pulmonary nodules   . CHF (congestive heart failure)   . Lumbar spinal stenosis   . COPD (chronic obstructive pulmonary disease)   . CAD (coronary artery disease)   . On home oxygen therapy     "2L unless I'm outside" (02/01/2015)  . DVT (deep venous thrombosis) 11/2013; 02/01/2015    [name] 01/18/2014; "got one behind my right knee now" (02/01/2015)  . Parkinson's disease     /notes 08/15/2014  . Recurrent aspiration pneumonia   . Sleep apnea     "gave machine back; couldn't use it" (02/01/2015)  . Hypothyroidism   . Type II  diabetes mellitus   . Iron deficiency anemia     "real bad" (02/01/2015)  . GERD (gastroesophageal reflux disease)   . Seizures     "don't know what they are from; last one was ~ 4 months ago" (02/01/2015)  . DJD (degenerative joint disease)     [name] 08/15/2014  . Arthritis     "real bad in my back" (02/01/2015)  . Chronic lower back pain   . History of gout   . Depression     "maybe a touch before I started taking seizure RX" (02/01/2015)  . Anxiety   . Basal cell carcinoma     "all over my face and body"   . Throat cancer     [name] 08/15/2014  . Heart attack 03/20/1995    Past Surgical History  Procedure Laterality Date  . Anterior cervical decomp/discectomy fusion  X 2  . Radical neck dissection Right ~ 1998    "throat cancer"  . Total knee arthroplasty Right 2011  . Thyroidectomy  ~ 1996 X 2    "took 1/2 out at a time"  . Cataract extraction w/ intraocular lens  implant, bilateral Bilateral   . Tonsillectomy    . Excisional hemorrhoidectomy    . Joint replacement    . Back surgery    . Posterior fusion cervical spine  X 1  . Coronary angioplasty with stent placement      "I've got at least 3" (02/01/2015)  . Coronary artery bypass graft  1996    "CABG X3"  . Basal cell carcinoma excision      "all over my face and body"     Allergies: Other  Medications: Prior to Admission medications   Medication Sig Start Date End Date Taking? Authorizing Provider  amLODipine (NORVASC) 5 MG tablet Take 5 mg by mouth at bedtime.    Yes Historical Provider, MD  Artificial Tear Ointment (DRY EYES OP) Place 1 drop into both eyes daily as needed (dry eyes).   Yes Historical Provider, MD  aspirin EC 81 MG tablet Take 81 mg by mouth at bedtime.    Yes Historical Provider, MD  atorvastatin (LIPITOR) 40 MG tablet Take 40 mg by mouth at bedtime.   Yes Historical Provider, MD  bisoprolol (ZEBETA) 10 MG tablet Take 10 mg by mouth at bedtime.    Yes Historical Provider, MD  carbidopa-levodopa  (SINEMET IR) 25-100 MG per tablet Take 3 tablets by mouth 3 (three) times daily.    Yes Historical Provider, MD  clonazePAM (KLONOPIN) 0.5 MG tablet Take 0.5 mg by mouth every morning. 08/23/12  Yes Historical Provider, MD  clonazePAM (KLONOPIN) 1 MG tablet Take 1 mg by mouth at bedtime. 09/20/14  Yes Historical Provider, MD  isosorbide mononitrate (IMDUR) 30 MG 24 hr tablet Take 30 mg by mouth at bedtime.    Yes Historical Provider, MD  lamoTRIgine (LAMICTAL) 25 MG tablet Take 25 mg by mouth 2 (two) times daily.   Yes Historical Provider, MD  levothyroxine (SYNTHROID, LEVOTHROID) 137 MCG tablet Take 137 mcg by mouth daily.   Yes Historical Provider, MD  losartan (COZAAR) 100 MG tablet Take 100 mg by mouth at bedtime.    Yes Historical Provider, MD  Menthol, Topical Analgesic, (ICY HOT BACK EX) Apply 1 application topically daily as needed (back pain).   Yes Historical Provider, MD  metFORMIN (GLUCOPHAGE) 500 MG tablet Take 500 mg by mouth 2 (two) times daily. 12/12/14  Yes Historical Provider, MD  Multiple Vitamin (MULTI-VITAMINS) TABS Take 1 tablet by mouth daily. Take the same brand of multivitamins while on warfarin (coumadin).  If multivitamin product is changed, let your provider know.  Have INR checked in 3-5 days after taking new product. 09/24/10  Yes Historical Provider, MD  nitroGLYCERIN (NITROSTAT) 0.4 MG SL tablet Place 0.4 mg under the tongue every 5 (five) minutes x 3 doses as needed for chest pain.    Yes Historical Provider, MD  omeprazole (PRILOSEC) 20 MG capsule Take 40 mg by mouth 2 (two) times daily.   Yes Historical Provider, MD  PARoxetine (PAXIL) 20 MG tablet Take 20 mg by mouth 3 (three) times daily. 07/30/10  Yes Historical Provider, MD  tamsulosin (FLOMAX) 0.4 MG CAPS Take 0.4 mg by mouth daily.    Yes Historical Provider, MD  tiZANidine (ZANAFLEX) 4 MG tablet Take 4 mg by mouth every 8 (eight) hours as needed for muscle spasms.   Yes Historical Provider, MD  warfarin (COUMADIN) 4  MG tablet Take 4 mg by mouth daily at 6 PM.  01/16/15  Yes Historical Provider, MD  bisacodyl (DULCOLAX) 10 MG suppository Place 1 suppository (10 mg total) rectally daily as needed for moderate constipation. 02/01/15   Tama High III, MD  cefTRIAXone 1 g in dextrose 5 % 50 mL Inject 1 g into the vein daily. 02/01/15   Tama High III, MD  docusate sodium (COLACE) 100 MG capsule Take 1 capsule (100 mg total) by mouth 2 (two) times daily.  02/01/15   Tama High III, MD  enoxaparin (LOVENOX) 150 MG/ML injection Inject 0.61 mLs (90 mg total) into the skin every 12 (twelve) hours. 02/02/15   Tama High III, MD  mometasone-formoterol (DULERA) 200-5 MCG/ACT AERO Inhale 2 puffs into the lungs 2 (two) times daily. 02/01/15   Tama High III, MD  oxyCODONE-acetaminophen (PERCOCET/ROXICET) 5-325 MG per tablet Take 1 tablet by mouth every 6 (six) hours as needed for severe pain. 02/01/15   Tama High III, MD  predniSONE (DELTASONE) 20 MG tablet Take 1 tablet (20 mg total) by mouth daily with breakfast. 02/01/15   Tama High III, MD  tiotropium (SPIRIVA) 18 MCG inhalation capsule Place 1 capsule (18 mcg total) into inhaler and inhale daily. 02/01/15   Adin Hector, MD     Family History  Problem Relation Age of Onset  . Leukemia Paternal Grandmother   . Cancer Maternal Grandmother     stomach  . Cancer Paternal Aunt   . Other Father     bacterial endocarditis  . Rheum arthritis Father     History   Social History  . Marital Status: Married    Spouse Name: N/A  . Number of Children: 2  . Years of Education: N/A   Occupational History  . Retired     has worked in [employer] in the past, exposed to lint.    Social History Main Topics  . Smoking status: Former Smoker -- 2.00 packs/day for 35 years    Types: Cigarettes    Quit date: 03/20/1995  . Smokeless tobacco: Never Used  . Alcohol Use: Yes     Comment: 02/01/2015 "stopped drinking in ~ 1996; never had problem w/it"  . Drug Use:  No  . Sexual Activity: No   Other Topics Concern  . None   Social History Narrative    Review of Systems  Constitutional: Negative for fever, activity change, appetite change and fatigue.  Respiratory: Positive for cough and choking. Negative for chest tightness and shortness of breath.   Cardiovascular: Negative for chest pain.  Gastrointestinal: Negative for nausea, vomiting and abdominal pain.  Musculoskeletal: Positive for back pain. Negative for gait problem.  Skin: Negative.   Neurological: Negative.   Psychiatric/Behavioral: Negative.     Vital Signs: BP 143/75 mmHg  Pulse 57  Temp(Src) 98.3 F (36.8 C) (Oral)  Resp 18  Wt 187 lb 4.8 oz (84.959 kg)  SpO2 100%  Physical Exam  Constitutional: He is oriented to person, place, and time. He appears well-developed and well-nourished.  HENT:  Head: Normocephalic and atraumatic.  Eyes: EOM are normal.  Neck: Normal range of motion. Neck supple.  Cardiovascular: Normal rate, regular rhythm and normal heart sounds.   Pulmonary/Chest: Effort normal and breath sounds normal. No respiratory distress.  Abdominal: Soft. Bowel sounds are normal.  Musculoskeletal: Normal range of motion.  Neurological: He is alert and oriented to person, place, and time.  Skin: Skin is warm and dry.  Some scarring on chest, but not at LUQ and should not affect G Tube placement  Psychiatric: He has a normal mood and affect. His behavior is normal. Judgment and thought content normal.  Vitals reviewed.   Mallampati Score:  MD Evaluation Airway: WNL Heart: WNL Abdomen: WNL Chest/ Lungs: WNL ASA  Classification: 3 Mallampati/Airway Score: Two  Imaging: Dg Chest 2 View  01/29/2015   CLINICAL DATA:  Acute respiratory failure, aspiration pneumonia.  EXAM: CHEST  2 VIEW  COMPARISON:  January 26, 2015.  FINDINGS: Stable cardiomegaly. Stable central pulmonary vascular congestion is noted. Status post coronary artery bypass graft. No pneumothorax is  noted. Stable small linear densities are noted laterally in left lung base concerning for scarring or possibly subsegmental atelectasis. Right basilar opacity is decreased consistent with slightly improved pneumonia or atelectasis. Interval development of minimal pleural effusion is noted.  IMPRESSION: Stable cardiomegaly and central pulmonary vascular congestion is noted. Slightly decreased right basilar opacity is noted consistent with slightly improved pneumonia or atelectasis, although there is interval development of minimal right pleural effusion. Continued radiographic follow-up is recommended.   Electronically Signed   By: Marijo Conception, M.D.   On: 01/29/2015 09:58   Dg Thoracic Spine 2 View  01/28/2015   CLINICAL DATA:  Severe upper back pain.  Initial encounter.  EXAM: THORACIC SPINE - 2-3 VIEWS  COMPARISON:  CT chest 08/15/2014.  FINDINGS: There is no fracture or malalignment. Multilevel anterior endplate spurring is worst in the mid and lower thoracic spine and unchanged in appearance. There is mild convex left scoliosis with the apex at approximately L1. Six intact median sternotomy wires are again seen. The patient is status post CABG.  IMPRESSION: No acute abnormality.  No change in the appearance of thoracic spondylosis.   Electronically Signed   By: Inge Rise M.D.   On: 01/28/2015 14:47   Dg Lumbar Spine 2-3 Views  01/28/2015   CLINICAL DATA:  Back pain  EXAM: LUMBAR SPINE - 2-3 VIEW  COMPARISON:  Lumbar spine MRI 01/20/2007  FINDINGS: 5 non rib-bearing lumbar type vertebral bodies are identified. Disc degenerative change again noted most prominent at L4-L5 and L5-S1. 2 mm anterolisthesis of L3 on L4. Vertebral body heights are preserved.  IMPRESSION: Mild lower lumbar spine disc degenerative change.   Electronically Signed   By: Conchita Paris M.D.   On: 01/28/2015 14:51   Mr Lumbar Spine W Wo Contrast  02/01/2015   CLINICAL DATA:  Diabetic patient with low back pain extending to  both buttocks. Recent admission for multi lobar pneumonia. Sepsis with group B Streptococcus.  EXAM: MRI LUMBAR SPINE WITHOUT AND WITH CONTRAST  TECHNIQUE: Multiplanar and multiecho pulse sequences of the lumbar spine were obtained without and with intravenous contrast.  CONTRAST:  75mL MULTIHANCE GADOBENATE DIMEGLUMINE 529 MG/ML IV SOLN  COMPARISON:  Plain radiographs 01/28/2015 appear  unremarkable.  FINDINGS: Segmentation: Normal  Alignment: 3 mm anterolisthesis L3-4 is facet mediated. Alignment is otherwise anatomic.  Vertebrae: No worrisome osseous lesion.Abnormal edema and early endplate erosive change above and below the L4-5 interspace. This is accompanied by a T2 and STIR hyperintensity of that disc space along with a regional ventral epidural collection described below. Findings are concerning for osteomyelitis.  Conus medullaris: Normal in size, signal, and location.  Paraspinal tissues: No evidence for hydronephrosis or paravertebral mass. 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  Disc levels:  L1-L2:  Normal.  L2-L3: Central and leftward protrusion. Mild facet arthropathy. LEFT subarticular zone narrowing could affect the L3 nerve root. No significant spinal stenosis.  L3-L4: 3 mm of facet mediated anterolisthesis. Broad-based central protrusion. Moderate facet and ligamentum flavum hypertrophy. Asymmetric enhancement and joint effusion of the LEFT L3-4 facet; LEFT septic arthritis is likely. LEFT subarticular zone narrowing could affect the LEFT L4 nerve root. Marked anterior osteophytic spurring.  L4-L5: T2 hyperintense disc with central protrusion. BILATERAL facet and ligamentum flavum hypertrophy. Enhancing ventral epidural collection, up to 6 mm thickness behind the  L4 vertebral body, with mass effect on the ventral thecal sac and RIGHT greater than LEFT L5 nerve roots. BILATERAL foraminal narrowing, multifactorial, likely to affect the RIGHT greater than LEFT L4 nerve roots as well.  Central nonenhancing collection likely representing purulence. Epidural abscess is suspected.  L5-S1: Disc space narrowing. Mild facet arthropathy. No impingement.  IMPRESSION: Findings consistent with L4-5 diskitis, regional osteomyelitis, and shallow ventral epidural abscess behind the L4 vertebral body.  Multifactorial neural impingement at the L4-5 level related to disc material, posterior element hypertrophy, and enhancing ventral epidural soft tissue. RIGHT greater than LEFT L4 and L5 nerve root impingement.  3 mm of facet mediated slip L3-L4 with broad-based central protrusion. Posterior element hypertrophy with asymmetric enhancement and effusion of the LEFT L3-4 facet; these findings are most consistent with septic arthritis of that joint. LEFT L4 nerve root impingement is likely.  Findings discussed with ordering provider at time of interpretation.   Electronically Signed   By: Staci Righter M.D.   On: 02/01/2015 12:36   Dg Chest Port 1 View  01/26/2015   CLINICAL DATA:  Shortness of Breath  EXAM: PORTABLE CHEST - 1 VIEW  COMPARISON:  Chest CT August 15, 2014  FINDINGS: There is airspace consolidation in portions of the right middle and lower lobes. There is mild atelectasis in left base. Heart is enlarged with pulmonary vascularity within normal limits. No adenopathy. Patient is status post coronary artery bypass grafting.  IMPRESSION: Areas of patchy infiltrate in the right middle and lower lobes. Left base atelectasis.  Followup PA and lateral chest X radiographs recommended in 3-4 weeks following trial of antibiotic therapy to ensure resolution and exclude underlying malignancy.   Electronically Signed   By: Lowella Grip III M.D.   On: 01/26/2015 20:22   Dg Swallowing Func-speech Pathology  02/03/2015    Objective Swallowing Evaluation:    Patient Details  Name: John Mendoza MRN: [id number] Date of Birth: January 18, 1946  Today's Date: 02/03/2015 Time: SLP Start Time (ACUTE ONLY): 1100-SLP Stop  Time (ACUTE ONLY): 1134 SLP Time Calculation (min) (ACUTE ONLY): 34 min  Past Medical History:  Past Medical History  Diagnosis Date  . Barrett esophagus     "pretty bad" (02/01/2015)  . Hypertension   . Hyperlipidemia   . Multiple pulmonary nodules   . CHF (congestive heart failure)   . Lumbar spinal stenosis   . COPD (chronic obstructive pulmonary disease)   . CAD (coronary artery disease)   . On home oxygen therapy     "2L unless I'm outside" (02/01/2015)  . DVT (deep venous thrombosis) 11/2013; 02/01/2015    [name] 01/18/2014; "got one behind my right knee now" (02/01/2015)  . Parkinson's disease     /notes 08/15/2014  . Recurrent aspiration pneumonia   . Sleep apnea     "gave machine back; couldn't use it" (02/01/2015)  . Hypothyroidism   . Type II diabetes mellitus   . Iron deficiency anemia     "real bad" (02/01/2015)  . GERD (gastroesophageal reflux disease)   . Seizures     "don't know what they are from; last one was ~ 4 months ago" (02/01/2015)   . DJD (degenerative joint disease)     [name] 08/15/2014  . Arthritis     "real bad in my back" (02/01/2015)  . Chronic lower back pain   . History of gout   . Depression     "maybe a touch before I started taking seizure RX" (02/01/2015)  .  Anxiety   . Basal cell carcinoma     "all over my face and body"   . Throat cancer     [name] 08/15/2014  . Heart attack 03/20/1995   Past Surgical History:  Past Surgical History  Procedure Laterality Date  . Anterior cervical decomp/discectomy fusion  X 2  . Radical neck dissection Right ~ 1998    "throat cancer"  . Total knee arthroplasty Right 2011  . Thyroidectomy  ~ 1996 X 2    "took 1/2 out at a time"  . Cataract extraction w/ intraocular lens  implant, bilateral Bilateral   . Tonsillectomy    . Excisional hemorrhoidectomy    . Joint replacement    . Back surgery    . Posterior fusion cervical spine  X 1  . Coronary angioplasty with stent placement      "I've got at least 3" (02/01/2015)  . Coronary artery bypass graft  1996    "CABG X3"   . Basal cell carcinoma excision      "all over my face and body"    HPI:  Other Pertinent Information: This is a 69 year old male patient with  multiple medical problems including Goodpasture's disease with positive  ANCA on chronic prednisone, Barrett's esophagitis, hypertension, COPD on  chronic oxygen 2 L/m, Parkinson's disease, CAD, multiple pulmonary  nodules, history of DVT June 2015. Patient was recently admitted to  [facility] on 7/22 secondary to sepsis from aspiration  pneumonitis and later found to have group B streptococcal bacteremia. MRI  of the lumbar spine without contrast revealed L4-L5 discitis and vertebral  osteomyelitis with epidural abscess. Pt has a h/o chronic dysphagia s/p  XRT and surgical intervention for throat cancer, s/p ACDF, and possibly  impacted by PD. Pt has had previous swallowing evaluations at outside  facilities, most recently recommending nectar thick liquids.  No Data Recorded  Assessment / Plan / Recommendation CHL IP CLINICAL IMPRESSIONS 02/03/2015  Therapy Diagnosis Moderate to severe pharyngeal phase dysphagia;Moderate   oral phase dysphagia  Clinical Impression Pt with chronic dysphagia which is suspected to be  largely impacted from history of throat cancer and negative changes  following radiation therapy course.  Pt with moderate to severe  oropharyngeal dysphagia: epiglottic inversion very limited, poor laryngeal  elevation, and poor base of tongue retraction which lead to aspiration of  all consistencies seen both during and after the swallow. Pts swallow  function with limited clearance of bolus, despite multiple swallow  efforts. Moderate amount of residuals remained in the valleculae, pyriform  sinuses, and lateral channels which ended up spilling into laryngeal  vestibule and eventually passed below true vocal cords.  Sensation of  aspirate events not consistent and cough was ineffective of completely  clearing airway. Consulted with MD as pt  with recurrent aspiration PNA and  not safe to consume PO. Recommend NPO with nutrition and medicines via  alternative means. ST to follow up       CHL IP TREATMENT RECOMMENDATION 02/03/2015  Treatment Recommendations Therapy as outlined in treatment plan below     CHL IP DIET RECOMMENDATION 02/03/2015  SLP Diet Recommendations NPO;Alternative means - long-term  Liquid Administration via (None)  Medication Administration (None)  Compensations (None)  Postural Changes and/or Swallow Maneuvers (None)     CHL IP OTHER RECOMMENDATIONS 02/03/2015  Recommended Consults (None)  Oral Care Recommendations Oral care QID  Other Recommendations (None)     No flowsheet data found.   CHL IP FREQUENCY  AND DURATION 02/03/2015  Speech Therapy Frequency (ACUTE ONLY) min 2x/week  Treatment Duration 2 weeks     Pertinent Vitals/Pain     SLP Swallow Goals No flowsheet data found.  No flowsheet data found.    CHL IP REASON FOR REFERRAL 02/03/2015  Reason for Referral Objectively evaluate swallowing function            No flowsheet data found.  No flowsheet data found.  Arvil Chaco MA, CCC-SLP Acute Care Speech Language Pathologist          Levi Aland 02/03/2015, 12:19 PM     Labs:  CBC:  Recent Labs  02/01/15 1737 02/02/15 0557 02/03/15 0510 02/04/15 0538  WBC 8.6 8.2 8.3 8.2  HGB 9.4* 9.9* 9.6* 11.8*  HCT 30.3* 31.8* 31.6* 36.0*  PLT 261 262 280 PLATELET CLUMPS NOTED ON SMEAR, COUNT APPEARS ADEQUATE    COAGS:  Recent Labs  01/30/15 0410 01/31/15 0334 02/01/15 0338 02/02/15 0557  INR 3.10 2.57 2.28 1.56*    BMP:  Recent Labs  02/01/15 1737 02/02/15 0557 02/03/15 0510 02/04/15 0538  NA 134* 136 136 136  K 5.1 4.2 4.2 5.6*  CL 92* 97* 93* 98*  CO2 36* 32 37* 30  GLUCOSE 291* 138* 109* 129*  BUN 19 15 14  21*  CALCIUM 9.2 9.1 9.2 9.1  CREATININE 0.91 0.70 0.77 0.77  GFRNONAA >60 >60 >60 >60  GFRAA >60 >60 >60 >60    LIVER FUNCTION TESTS:  Recent Labs  01/26/15 1946 01/28/15 0454  02/01/15 1737  BILITOT 0.8 0.4 0.6  AST 16 22 18   ALT 14* <5* 15*  ALKPHOS 79 66 60  PROT 7.4 6.4* 6.2*  ALBUMIN 3.6 2.9* 2.5*    TUMOR MARKERS: No results for input(s): AFPTM, CEA, CA199, CHROMGRNA in the last 8760 hours.  Assessment and Plan:  Dysphagia, probably due to h/o throat cancer and radical neck dissection  He has lots of questions regarding G tube and also requested a nutrition/dietician consult prior to placement.  Will plan to place gastrostomy tube tomorrow  Will hold evening dose of Lovenox and make pt NPO after MN.  Risks and Benefits discussed with the patient including, but not limited to the need for a barium enema during the procedure, bleeding, infection, peritonitis, or damage to adjacent structures.  All of the patient's questions were answered, patient is agreeable to proceed. Consent signed and in chart.  Thank you for this interesting consult.  I greatly enjoyed meeting John Mendoza and look forward to participating in their care.  A copy of this report was sent to the requesting provider on this date.  Signed: Murrell Redden PA-C 02/04/2015, 9:31 AM   I spent a total of 40 Minutes  in face to face in clinical consultation, greater than 50% of which was counseling/coordinating care for placement of G Tube.

## 2015-02-04 NOTE — Progress Notes (Addendum)
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 Patient also on chronic prednisone secondary to Goodpasture's. Patient's pneumonia is improving. Patient has been seen in consultation by ID that  recommended holding aspiration because source of infection likely consistent with group B streptococcus bacteremia. Repeat blood cultures were obtained and are pending. Continue IV Rocephin for at least 6-8 weeks per ID recommendations. Increase Duragesic patch to 50 MCG's every 3 days for pain management. IV morphine for when necessary breakthrough pain. Patient needs to follow-up with ID as outpatient.  #2 GpB  streptococcus bacteremia Continue IV Rocephin.  #3 Pneumonia Patient with recent pneumonia approximately week prior to admission. Medical improvement. On IV Rocephin. Continue oxygen, nebulizers.  #4 COPD Stable. Continue Spiriva. Continue dulera.  #5 hypertension Patient blood pressure elevated. Start IV lopressor.  #6 coronary artery disease Stable. Due to dysphagia patient unable to take medications orally until PEG tube is placed. Place on IV Lopressor.   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  However due to patient's recent diagnosis of Parkinson's will need to keep a close eye on patient's balance and carefully evaluate his fall risk. Patients INR was reversed in anticipation of possible interventional radiological procedure which per ID note is not needed at this time. Continue full dose Lovenox due to dysphagia patient unable to take while awaiting PEG tube placement.  #8 Parkinson's Disease Continue sinemet.  #9 hypothyroidism Continue IV Synthroid.   #10 history of multiple pulmonary nodules Follow up in the outpatient setting by pulmonary.  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  #12 dysphagia Speech therapy has assessed the patient and patient underwent modified barium swallow and failed. Speech therapy recommending nothing by mouth status. Patient has been seen by interventional radiology and patient scheduled for PEG tube placement tomorrow.   #13 prophylaxis Lovenox for DVT prophylaxis.  Code Status: Full Family Communication: Updated patient at bedside. No family present. Disposition Plan: Home vs SNF when medically stable.   Consultants:  Infectious diseases: Dr. Linus Salmons 02/02/2015  IR  Procedures:  MRI lumbar spine 02/01/2015  MBS  Antibiotics:  IV Rocephin 02/02/2015  HPI/Subjective: Patient standing at sink shaving. No SOB. No CP. States duragesic patch helped a little.  Objective: Filed Vitals:   02/04/15 1039  BP: 149/77  Pulse: 63  Temp:   Resp:     Intake/Output Summary (Last 24 hours) at 02/04/15 1121 Last data filed at 02/04/15 1022  Gross per 24 hour  Intake    203 ml  Output   1800 ml  Net  -1597 ml   Filed Weights   02/02/15 0523 02/03/15 0608  Weight: 91.2 kg (201 lb 1 oz) 84.959 kg (187 lb 4.8 oz)    Exam:   General:  NAD  Cardiovascular: RRR  Respiratory: CTAB  Abdomen:  Soft, nontender, nondistended, positive bowel sounds.  Musculoskeletal: No  clubbing cyanosis or edema.  Data Reviewed: Basic Metabolic Panel:  Recent Labs Lab 01/31/15 0334 02/01/15 1737 02/02/15 0557 02/03/15 0510 02/04/15 0538 02/04/15 0900  NA 139 134* 136 136 136  --   K 4.2 5.1 4.2 4.2 5.6* 4.4  CL 96* 92* 97* 93* 98*  --   CO2 36* 36* 32 37* 30  --   GLUCOSE 149* 291* 138* 109* 129*  --   BUN 13 19 15 14  21*  --   CREATININE 0.67 0.91 0.70 0.77 0.77  --   CALCIUM 9.0 9.2 9.1 9.2 9.1  --   MG  --   --   --  2.0  --   --    Liver Function Tests:  Recent Labs Lab 02/01/15 1737  AST 18  ALT 15*  ALKPHOS 60  BILITOT 0.6  PROT 6.2*  ALBUMIN 2.5*   No results for input(s): LIPASE, AMYLASE in the last 168 hours. No results for input(s): AMMONIA in the last 168 hours. CBC:  Recent Labs Lab 01/30/15 0410 02/01/15 1737 02/02/15 0557 02/03/15 0510 02/04/15 0538  WBC 10.1 8.6 8.2 8.3 8.2  NEUTROABS  --  8.1*  --   --   --   HGB 9.2* 9.4* 9.9* 9.6* 11.8*  HCT 29.7* 30.3* 31.8* 31.6* 36.0*  MCV 75.4* 76.1* 76.8* 76.9* 75.8*  PLT 204 261 262 280 PLATELET CLUMPS NOTED ON SMEAR, COUNT APPEARS ADEQUATE   Cardiac Enzymes: No results for input(s): CKTOTAL, CKMB, CKMBINDEX, TROPONINI in the last 168 hours. BNP (last 3 results)  Recent Labs  01/26/15 1946  BNP 420.0*    ProBNP (last 3 results) No results for input(s): PROBNP in the last 8760 hours.  CBG:  Recent Labs Lab 02/03/15 0801 02/03/15 1215 02/03/15 1638 02/03/15 2228 02/04/15 0814  GLUCAP 147* 114* 297* 125* 130*    Recent Results (from the past 240 hour(s))  Blood Culture (routine x 2)     Status: None   Collection Time: 01/26/15  7:46 PM  Result Value Ref Range Status   Specimen Description BLOOD LEFT ASSIST CONTROL  Final   Special Requests BOTTLES DRAWN AEROBIC AND ANAEROBIC 5CC  Final   Culture  Setup Time   Final    GRAM POSITIVE COCCI IN CHAINS IN BOTH AEROBIC AND ANAEROBIC BOTTLES CRITICAL RESULT CALLED TO, READ BACK BY AND VERIFIED WITH: MYRA FLOWERS,RN  AT 0800 01/27/2015 BY JRS    Culture   Final    STREPTOCOCCUS AGALACTIAE Virtually 100% of S. agalactiae (Group B) strains are susceptible to Penicillin.  For Penicillin-allergic patients, Erythromycin (85-95% sensitive) and Clindamycin (80% sensitive) are drugs of choice. Contact microbiology lab to request sensitivities if  needed within 7 days.    Report Status 01/29/2015 FINAL  Final   Organism ID, Bacteria STREPTOCOCCUS AGALACTIAE  Final      Susceptibility   Streptococcus agalactiae - MIC*    VANCOMYCIN <=0.5 SENSITIVE Sensitive     CLINDAMYCIN <=0.25 RESISTANT Resistant     * STREPTOCOCCUS AGALACTIAE  Blood Culture (routine x 2)     Status: None   Collection Time: 01/26/15  7:46 PM  Result Value Ref Range Status   Specimen Description BLOOD LEFT HAND  Final   Special Requests BOTTLES DRAWN AEROBIC AND ANAEROBIC 5CC  Final   Culture  Setup Time   Final    GRAM POSITIVE COCCI IN CHAINS IN BOTH AEROBIC AND ANAEROBIC BOTTLES CRITICAL VALUE  NOTED.  VALUE IS CONSISTENT WITH PREVIOUSLY REPORTED AND CALLED VALUE.    Culture   Final    STREPTOCOCCUS AGALACTIAE Virtually 100% of S. agalactiae (Group B) strains are susceptible to Penicillin.  For Penicillin-allergic patients, Erythromycin (85-95% sensitive) and Clindamycin (80% sensitive) are drugs of choice. Contact microbiology lab to request sensitivities if  needed within 7 days.    Report Status 01/29/2015 FINAL  Final   Organism ID, Bacteria STREPTOCOCCUS AGALACTIAE  Final      Susceptibility   Streptococcus agalactiae - MIC*    VANCOMYCIN <=0.5 SENSITIVE Sensitive     CLINDAMYCIN <=0.25 RESISTANT Resistant     * STREPTOCOCCUS AGALACTIAE  MRSA PCR Screening     Status: None   Collection Time: 01/26/15 10:00 PM  Result Value Ref Range Status   MRSA by PCR NEGATIVE NEGATIVE Final    Comment:        The GeneXpert MRSA Assay (FDA approved for NASAL specimens only), is one component of a comprehensive MRSA  colonization surveillance program. It is not intended to diagnose MRSA infection nor to guide or monitor treatment for MRSA infections.   Urine culture     Status: None   Collection Time: 01/27/15  4:39 AM  Result Value Ref Range Status   Specimen Description URINE, RANDOM  Final   Special Requests NONE  Final   Culture NO GROWTH 1 DAY  Final   Report Status 01/28/2015 FINAL  Final  Culture, expectorated sputum-assessment     Status: None   Collection Time: 01/28/15  3:05 PM  Result Value Ref Range Status   Specimen Description SPUTUM  Final   Special Requests NONE  Final   Sputum evaluation THIS SPECIMEN IS ACCEPTABLE FOR SPUTUM CULTURE  Final   Report Status 01/28/2015 FINAL  Final  Culture, respiratory (NON-Expectorated)     Status: None   Collection Time: 01/28/15  3:05 PM  Result Value Ref Range Status   Specimen Description SPUTUM  Final   Special Requests NONE Reflexed from [place]  Final   Gram Stain   Final    EXCELLENT SPECIMEN - 90-100% WBCS MANY WBC SEEN MODERATE YEAST FEW GRAM NEGATIVE RODS    Culture MODERATE GROWTH CANDIDA SPECIES, NOT ALBICANS  Final   Report Status 01/31/2015 FINAL  Final  Culture, blood (routine x 2)     Status: None (Preliminary result)   Collection Time: 02/01/15  5:46 PM  Result Value Ref Range Status   Specimen Description BLOOD RIGHT ANTECUBITAL  Final   Special Requests BOTTLES DRAWN AEROBIC AND ANAEROBIC 10CC EACH  Final   Culture NO GROWTH 2 DAYS  Final   Report Status PENDING  Incomplete  Culture, blood (routine x 2)     Status: None (Preliminary result)   Collection Time: 02/01/15  5:55 PM  Result Value Ref Range Status   Specimen Description BLOOD LEFT ANTECUBITAL  Final   Special Requests BOTTLES DRAWN AEROBIC AND ANAEROBIC 10CC EACH  Final   Culture NO GROWTH 2 DAYS  Final   Report Status PENDING  Incomplete     Studies: Dg Swallowing Func-speech Pathology  02/03/2015    Objective Swallowing Evaluation:    Patient  Details  Name: TRAFTON ROKER MRN: [id number] Date of Birth: 14-Dec-1945  Today's Date: 02/03/2015 Time: SLP Start Time (ACUTE ONLY): 1100-SLP Stop Time (ACUTE ONLY): 1134 SLP Time Calculation (min) (ACUTE ONLY): 34 min  Past Medical History:  Past Medical History  Diagnosis Date  . Barrett esophagus     "  pretty bad" (02/01/2015)  . Hypertension   . Hyperlipidemia   . Multiple pulmonary nodules   . CHF (congestive heart failure)   . Lumbar spinal stenosis   . COPD (chronic obstructive pulmonary disease)   . CAD (coronary artery disease)   . On home oxygen therapy     "2L unless I'm outside" (02/01/2015)  . DVT (deep venous thrombosis) 11/2013; 02/01/2015    [name] 01/18/2014; "got one behind my right knee now" (02/01/2015)  . Parkinson's disease     /notes 08/15/2014  . Recurrent aspiration pneumonia   . Sleep apnea     "gave machine back; couldn't use it" (02/01/2015)  . Hypothyroidism   . Type II diabetes mellitus   . Iron deficiency anemia     "real bad" (02/01/2015)  . GERD (gastroesophageal reflux disease)   . Seizures     "don't know what they are from; last one was ~ 4 months ago" (02/01/2015)   . DJD (degenerative joint disease)     [name] 08/15/2014  . Arthritis     "real bad in my back" (02/01/2015)  . Chronic lower back pain   . History of gout   . Depression     "maybe a touch before I started taking seizure RX" (02/01/2015)  . Anxiety   . Basal cell carcinoma     "all over my face and body"   . Throat cancer     [name] 08/15/2014  . Heart attack 03/20/1995   Past Surgical History:  Past Surgical History  Procedure Laterality Date  . Anterior cervical decomp/discectomy fusion  X 2  . Radical neck dissection Right ~ 1998    "throat cancer"  . Total knee arthroplasty Right 2011  . Thyroidectomy  ~ 1996 X 2    "took 1/2 out at a time"  . Cataract extraction w/ intraocular lens  implant, bilateral Bilateral   . Tonsillectomy    . Excisional hemorrhoidectomy    . Joint replacement    . Back surgery    . Posterior fusion cervical  spine  X 1  . Coronary angioplasty with stent placement      "I've got at least 3" (02/01/2015)  . Coronary artery bypass graft  1996    "CABG X3"  . Basal cell carcinoma excision      "all over my face and body"    HPI:  Other Pertinent Information: This is a 69 year old male patient with  multiple medical problems including Goodpasture's disease with positive  ANCA on chronic prednisone, Barrett's esophagitis, hypertension, COPD on  chronic oxygen 2 L/m, Parkinson's disease, CAD, multiple pulmonary  nodules, history of DVT June 2015. Patient was recently admitted to  [facility] on 7/22 secondary to sepsis from aspiration  pneumonitis and later found to have group B streptococcal bacteremia. MRI  of the lumbar spine without contrast revealed L4-L5 discitis and vertebral  osteomyelitis with epidural abscess. Pt has a h/o chronic dysphagia s/p  XRT and surgical intervention for throat cancer, s/p ACDF, and possibly  impacted by PD. Pt has had previous swallowing evaluations at outside  facilities, most recently recommending nectar thick liquids.  No Data Recorded  Assessment / Plan / Recommendation CHL IP CLINICAL IMPRESSIONS 02/03/2015  Therapy Diagnosis Moderate to severe pharyngeal phase dysphagia;Moderate   oral phase dysphagia  Clinical Impression Pt with chronic dysphagia which is suspected to be  largely impacted from history of throat cancer and negative changes  following radiation therapy course.  Pt  with moderate to severe  oropharyngeal dysphagia: epiglottic inversion very limited, poor laryngeal  elevation, and poor base of tongue retraction which lead to aspiration of  all consistencies seen both during and after the swallow. Pts swallow  function with limited clearance of bolus, despite multiple swallow  efforts. Moderate amount of residuals remained in the valleculae, pyriform  sinuses, and lateral channels which ended up spilling into laryngeal  vestibule and eventually passed below  true vocal cords.  Sensation of  aspirate events not consistent and cough was ineffective of completely  clearing airway. Consulted with MD as pt with recurrent aspiration PNA and  not safe to consume PO. Recommend NPO with nutrition and medicines via  alternative means. ST to follow up       CHL IP TREATMENT RECOMMENDATION 02/03/2015  Treatment Recommendations Therapy as outlined in treatment plan below     CHL IP DIET RECOMMENDATION 02/03/2015  SLP Diet Recommendations NPO;Alternative means - long-term  Liquid Administration via (None)  Medication Administration (None)  Compensations (None)  Postural Changes and/or Swallow Maneuvers (None)     CHL IP OTHER RECOMMENDATIONS 02/03/2015  Recommended Consults (None)  Oral Care Recommendations Oral care QID  Other Recommendations (None)     No flowsheet data found.   CHL IP FREQUENCY AND DURATION 02/03/2015  Speech Therapy Frequency (ACUTE ONLY) min 2x/week  Treatment Duration 2 weeks     Pertinent Vitals/Pain     SLP Swallow Goals No flowsheet data found.  No flowsheet data found.    CHL IP REASON FOR REFERRAL 02/03/2015  Reason for Referral Objectively evaluate swallowing function            No flowsheet data found.  No flowsheet data found.  Arvil Chaco MA, CCC-SLP Acute Care Speech Language Pathologist          Levi Aland 02/03/2015, 12:19 PM     Scheduled Meds: . antiseptic oral rinse  7 mL Mouth Rinse BID  . budesonide  0.25 mg Nebulization BID  . carbidopa-levodopa  2 tablet Oral TID  . cefTRIAXone (ROCEPHIN)  IV  2 g Intravenous Q24H  . docusate sodium  100 mg Oral BID  . enoxaparin (LOVENOX) injection  80 mg Subcutaneous Q12H  . fentaNYL  25 mcg Transdermal Q72H  . folic acid (FOLVITE) IVPB  1 mg Intravenous Daily  . insulin aspart  0-20 Units Subcutaneous TID WC  . insulin aspart  0-5 Units Subcutaneous QHS  . lamoTRIgine  25 mg Oral BID  . levothyroxine  68 mcg Intravenous Daily  . methylPREDNISolone (SOLU-MEDROL) injection  60 mg  Intravenous Q24H  . metoprolol  5 mg Intravenous 3 times per day  . mometasone-formoterol  2 puff Inhalation BID  . pantoprazole (PROTONIX) IV  40 mg Intravenous Q24H  . PARoxetine  60 mg Oral Daily  . sodium chloride  3 mL Intravenous Q12H  . tamsulosin  0.4 mg Oral Daily  . thiamine IV  100 mg Intravenous Daily  . tiotropium  18 mcg Inhalation Daily   Continuous Infusions: . sodium chloride 0.9 % 1,000 mL infusion 75 mL/hr at 02/04/15 1054    Principal Problem:   Discitis of lumbar region Active Problems:   Multiple pulmonary nodules   Aspiration pneumonitis   Hypertension   Barrett's esophagus   CAD (coronary artery disease)   COPD (chronic obstructive pulmonary disease)   History of DVT (deep vein thrombosis)   Acute on chronic respiratory failure with hypoxia   Bacteremia due to  group B Streptococcus   Antineutrophil cytoplasmic antibody (ANCA) positive   Goodpasture's disease   Epidural abscess   Warfarin anticoagulation   Parkinsons disease   Vertebral osteomyelitis   Dysphagia/chronic   Diskitis   Dysphagia causing pulmonary aspiration with swallowing    Time spent: 30 mins    [facility] MD [facility] Pager [phone]. If 7PM-7AM, please contact night-coverage at [email or web address], password [facility] 02/04/2015, 11:21 AM  LOS: 3 days            Other

## 2015-02-05 ENCOUNTER — Inpatient Hospital Stay (HOSPITAL_COMMUNITY): Payer: Medicare Other

## 2015-02-05 DIAGNOSIS — K5900 Constipation, unspecified: Secondary | ICD-10-CM

## 2015-02-05 DIAGNOSIS — Z7901 Long term (current) use of anticoagulants: Secondary | ICD-10-CM

## 2015-02-05 DIAGNOSIS — B9689 Other specified bacterial agents as the cause of diseases classified elsewhere: Secondary | ICD-10-CM

## 2015-02-05 DIAGNOSIS — R768 Other specified abnormal immunological findings in serum: Secondary | ICD-10-CM

## 2015-02-05 DIAGNOSIS — M4646 Discitis, unspecified, lumbar region: Secondary | ICD-10-CM

## 2015-02-05 DIAGNOSIS — G061 Intraspinal abscess and granuloma: Principal | ICD-10-CM

## 2015-02-05 DIAGNOSIS — G20 Parkinson's disease: Secondary | ICD-10-CM

## 2015-02-05 DIAGNOSIS — M310 Hypersensitivity angiitis: Secondary | ICD-10-CM

## 2015-02-05 DIAGNOSIS — Z79899 Other long term (current) drug therapy: Secondary | ICD-10-CM

## 2015-02-05 LAB — BASIC METABOLIC PANEL
ANION GAP: 7 (ref 5–15)
BUN: 22 mg/dL — AB (ref 6–20)
CALCIUM: 8.9 mg/dL (ref 8.9–10.3)
CO2: 27 mmol/L (ref 22–32)
CREATININE: 0.72 mg/dL (ref 0.61–1.24)
Chloride: 102 mmol/L (ref 101–111)
GFR calc Af Amer: >60 mL/min (ref >60)
GFR calc non Af Amer: >60 mL/min (ref >60)
GLUCOSE: 130 mg/dL — AB (ref 65–99)
POTASSIUM: 4.4 mmol/L (ref 3.5–5.1)
SODIUM: 136 mmol/L (ref 135–145)

## 2015-02-05 LAB — CBC
HCT: 34.6 % — ABNORMAL LOW (ref 39.0–52.0)
Hemoglobin: 10.6 g/dL — ABNORMAL LOW (ref 13.0–17.0)
MCH: 23.6 pg — AB (ref 26.0–34.0)
MCHC: 30.6 g/dL (ref 30.0–36.0)
MCV: 77.1 fL — ABNORMAL LOW (ref 78.0–100.0)
Platelets: 306 10*3/uL (ref 150–400)
RBC: 4.49 MIL/uL (ref 4.22–5.81)
RDW: 17.8 % — ABNORMAL HIGH (ref 11.5–15.5)
WBC: 9 10*3/uL (ref 4.0–10.5)

## 2015-02-05 LAB — GLUCOSE, CAPILLARY
Glucose-Capillary: 125 mg/dL — ABNORMAL HIGH (ref 65–99)
Glucose-Capillary: 96 mg/dL (ref 65–99)
Glucose-Capillary: 204 mg/dL — ABNORMAL HIGH (ref 65–99)
Glucose-Capillary: 122 mg/dL — ABNORMAL HIGH (ref 65–99)

## 2015-02-05 LAB — PROTIME-INR
INR: 1.13 (ref 0.00–1.49)
Prothrombin Time: 14.7 seconds (ref 11.6–15.2)

## 2015-02-05 LAB — APTT: APTT: 31 s (ref 24–37)

## 2015-02-05 MED ORDER — SODIUM CHLORIDE 0.9 % IV SOLN
INTRAVENOUS | Status: AC
  Administered 2015-02-05: 15:00:00 via INTRAVENOUS
  Filled 2015-02-05: qty 1000

## 2015-02-05 MED ORDER — METHYLPREDNISOLONE SODIUM SUCC 40 MG IJ SOLR
40.0000 mg | INTRAMUSCULAR | Status: DC
  Administered 2015-02-06: 40 mg via INTRAVENOUS
  Filled 2015-02-05: qty 1

## 2015-02-05 MED ORDER — SORBITOL 70 % SOLN
960.0000 mL | TOPICAL_OIL | Freq: Once | ORAL | Status: AC
  Administered 2015-02-05: 960 mL via RECTAL
  Filled 2015-02-05 (×2): qty 240

## 2015-02-05 NOTE — Progress Notes (Signed)
[facility]    Subjective: No new complaints   Antibiotics:  Anti-infectives    Start     Dose/Rate Route Frequency Ordered Stop   02/02/15 1500  cefTRIAXone (ROCEPHIN) 1 g in dextrose 5 % 50 mL IVPB  Status:  Discontinued     1 g 100 mL/hr over 30 Minutes Intravenous Every 24 hours 02/01/15 1736 02/02/15 1023   02/02/15 1030  cefTRIAXone (ROCEPHIN) 2 g in dextrose 5 % 50 mL IVPB - [facility]     2 g 100 mL/hr over 30 Minutes Intravenous Every 24 hours 02/02/15 1023        Medications: Scheduled Meds: . antiseptic oral rinse  7 mL Mouth Rinse BID  . budesonide  0.25 mg Nebulization BID  . carbidopa-levodopa  2 tablet Oral TID  . cefTRIAXone (ROCEPHIN)  IV  2 g Intravenous Q24H  . fentaNYL  50 mcg Transdermal Q72H  . folic acid (FOLVITE) IVPB  1 mg Intravenous Daily  . insulin aspart  0-20 Units Subcutaneous TID WC  . insulin aspart  0-5 Units Subcutaneous QHS  . lamoTRIgine  25 mg Oral BID  . levothyroxine  68 mcg Intravenous Daily  . [START ON 02/06/2015] methylPREDNISolone (SOLU-MEDROL) injection  40 mg Intravenous Q24H  . metoprolol  5 mg Intravenous 3 times per day  . mometasone-formoterol  2 puff Inhalation BID  . pantoprazole (PROTONIX) IV  40 mg Intravenous Q24H  . PARoxetine  60 mg Oral Daily  . sodium chloride  3 mL Intravenous Q12H  . tamsulosin  0.4 mg Oral Daily  . thiamine IV  100 mg Intravenous Daily  . tiotropium  18 mcg Inhalation Daily   Continuous Infusions: . sodium chloride 0.9 % 1,000 mL infusion 75 mL/hr at 02/04/15 1054  . sodium chloride 0.9 % 1,000 mL infusion 75 mL/hr at 02/05/15 1501   PRN Meds:.acetaminophen **OR** acetaminophen, alum & mag hydroxide-simeth, bisacodyl, ipratropium-albuterol, LORazepam, morphine injection, ondansetron **OR** ondansetron (ZOFRAN) IV, tiZANidine    Objective: Weight change:   Intake/Output Summary (Last 24 hours) at 02/05/15 2204 Last data filed at 02/05/15 1927  Gross per 24 hour  Intake 1000.2 ml  Output   1500 ml  Net -499.8 ml   Blood pressure 180/88, pulse 62, temperature 98.1 F (36.7 C), temperature source Oral, resp. rate 18, height 5' 8.5" (1.74 m), weight 184 lb 1.4 oz (83.5 kg), SpO2 100 %. Temp:  [97.6 F (36.4 C)-98.1 F (36.7 C)] 98.1 F (36.7 C) (08/01 2116) Pulse Rate:  [62-66] 62 (08/01 2116) Resp:  [18-20] 18 (08/01 2116) BP: (152-180)/(83-88) 180/88 mmHg (08/01 2116) SpO2:  [98 %-100 %] 100 % (08/01 2116) Weight:  [184 lb 1.4 oz (83.5 kg)] 184 lb 1.4 oz (83.5 kg) (08/01 0518)  Physical Exam: General: Alert and awake, oriented x3, not in any acute distress. HEENT: anicteric sclera, pupils reactive to light and accommodation, EOMI CVS regular rate, normal r,  no murmur rubs or gallops Chest: clear to auscultation bilaterally, no wheezing, rales or rhonchi Abdomen: soft nontender, nondistended, normal bowel sounds, Extremities: no  clubbing or edema noted bilaterally Skin: no rashes Lymph: no new lymphadenopathy Neuro: nonfocal  CBC: @LABBLAST3 (wbc3,Hgb:3,Hct:3,Plt:3,INR:3APTT:3)@   BMET  Recent Labs  02/04/15 0538 02/04/15 0900 02/05/15 0547  NA 136  --  136  K 5.6* 4.4 4.4  CL 98*  --  102  CO2 30  --  27  GLUCOSE 129*  --  130*  BUN 21*  --  22*  CREATININE 0.77  --  0.72  CALCIUM 9.1  --  8.9     Liver Panel  No results for input(s): PROT, ALBUMIN, AST, ALT, ALKPHOS, BILITOT, BILIDIR, IBILI in the last 72 hours.     Sedimentation Rate No results for input(s): ESRSEDRATE in the last 72 hours. C-Reactive Protein No results for input(s): CRP in the last 72 hours.  Micro Results: Recent Results (from the past 720 hour(s))  Blood Culture (routine x 2)     Status: None   Collection Time: 01/26/15  7:46 PM  Result Value Ref Range Status   Specimen Description BLOOD LEFT ASSIST CONTROL  Final   Special Requests BOTTLES DRAWN AEROBIC AND ANAEROBIC 5CC  Final   Culture  Setup Time   Final    GRAM  POSITIVE COCCI IN CHAINS IN BOTH AEROBIC AND ANAEROBIC BOTTLES CRITICAL RESULT CALLED TO, READ BACK BY AND VERIFIED WITH: MYRA FLOWERS,RN AT 0800 01/27/2015 BY JRS    Culture   Final    STREPTOCOCCUS AGALACTIAE Virtually 100% of S. agalactiae (Group B) strains are susceptible to Penicillin.  For Penicillin-allergic patients, Erythromycin (85-95% sensitive) and Clindamycin (80% sensitive) are drugs of choice. Contact microbiology lab to request sensitivities if  needed within 7 days.    Report Status 01/29/2015 FINAL  Final   Organism ID, Bacteria STREPTOCOCCUS AGALACTIAE  Final      Susceptibility   Streptococcus agalactiae - MIC*    VANCOMYCIN <=0.5 SENSITIVE Sensitive     CLINDAMYCIN <=0.25 RESISTANT Resistant     * STREPTOCOCCUS AGALACTIAE  Blood Culture (routine x 2)     Status: None   Collection Time: 01/26/15  7:46 PM  Result Value Ref Range Status   Specimen Description BLOOD LEFT HAND  Final   Special Requests BOTTLES DRAWN AEROBIC AND ANAEROBIC 5CC  Final   Culture  Setup Time   Final    GRAM POSITIVE COCCI IN CHAINS IN BOTH AEROBIC AND ANAEROBIC BOTTLES CRITICAL VALUE NOTED.  VALUE IS CONSISTENT WITH PREVIOUSLY REPORTED AND CALLED VALUE.    Culture   Final    STREPTOCOCCUS AGALACTIAE Virtually 100% of S. agalactiae (Group B) strains are susceptible to Penicillin.  For Penicillin-allergic patients, Erythromycin (85-95% sensitive) and Clindamycin (80% sensitive) are drugs of choice. Contact microbiology lab to request sensitivities if  needed within 7 days.    Report Status 01/29/2015 FINAL  Final   Organism ID, Bacteria STREPTOCOCCUS AGALACTIAE  Final      Susceptibility   Streptococcus agalactiae - MIC*    VANCOMYCIN <=0.5 SENSITIVE Sensitive     CLINDAMYCIN <=0.25 RESISTANT Resistant     * STREPTOCOCCUS AGALACTIAE  MRSA PCR Screening     Status: None   Collection Time: 01/26/15 10:00 PM  Result Value Ref Range Status   MRSA by PCR NEGATIVE NEGATIVE Final     Comment:        The GeneXpert MRSA Assay (FDA approved for NASAL specimens only), is one component of a comprehensive MRSA colonization surveillance program. It is not intended to diagnose MRSA infection nor to guide or monitor treatment for MRSA infections.   Urine culture     Status: None   Collection Time: 01/27/15  4:39 AM  Result Value Ref Range Status   Specimen Description URINE, RANDOM  Final   Special Requests NONE  Final   Culture NO GROWTH 1 DAY  Final   Report Status 01/28/2015 FINAL  Final  Culture, expectorated sputum-assessment  Status: None   Collection Time: 01/28/15  3:05 PM  Result Value Ref Range Status   Specimen Description SPUTUM  Final   Special Requests NONE  Final   Sputum evaluation THIS SPECIMEN IS ACCEPTABLE FOR SPUTUM CULTURE  Final   Report Status 01/28/2015 FINAL  Final  Culture, respiratory (NON-Expectorated)     Status: None   Collection Time: 01/28/15  3:05 PM  Result Value Ref Range Status   Specimen Description SPUTUM  Final   Special Requests NONE Reflexed from [place]  Final   Gram Stain   Final    EXCELLENT SPECIMEN - 90-100% WBCS MANY WBC SEEN MODERATE YEAST FEW GRAM NEGATIVE RODS    Culture MODERATE GROWTH CANDIDA SPECIES, NOT ALBICANS  Final   Report Status 01/31/2015 FINAL  Final  Culture, blood (routine x 2)     Status: None (Preliminary result)   Collection Time: 02/01/15  5:46 PM  Result Value Ref Range Status   Specimen Description BLOOD RIGHT ANTECUBITAL  Final   Special Requests BOTTLES DRAWN AEROBIC AND ANAEROBIC 10CC EACH  Final   Culture NO GROWTH 4 DAYS  Final   Report Status PENDING  Incomplete  Culture, blood (routine x 2)     Status: None (Preliminary result)   Collection Time: 02/01/15  5:55 PM  Result Value Ref Range Status   Specimen Description BLOOD LEFT ANTECUBITAL  Final   Special Requests BOTTLES DRAWN AEROBIC AND ANAEROBIC 10CC EACH  Final   Culture NO GROWTH 4 DAYS  Final   Report Status PENDING   Incomplete    Studies/Results: Dg Abd 2 Views  14-Feb-2015   CLINICAL DATA:  Ileus, planned PEG tube placement  EXAM: ABDOMEN - 2 VIEW  COMPARISON:  None  FINDINGS: Significant stool in transverse and proximal descending colon.  Retained contrast in RIGHT colon through hepatic flexure to proximal transverse colon.  Prominent small bowel loops in the mid abdomen could reflect ileus.  Small amount of stool in rectum.  No evidence of bowel obstruction or bowel wall thickening.  Bones demineralized.  Emphysematous changes with atelectasis and potential scarring at lung bases.  IMPRESSION: Stool in transverse through proximal descending colon and contrast in the RIGHT colon to proximal transverse colon.  Air-filled loops of small bowel in the mid abdomen, overall nonobstructive pattern, question ileus.   Electronically Signed   By: Lavonia Dana M.D.   On: Feb 14, 2015 09:44      Assessment/Plan:  Principal Problem:   Discitis of lumbar region Active Problems:   Multiple pulmonary nodules   Aspiration pneumonitis   Hypertension   Barrett's esophagus   CAD (coronary artery disease)   COPD (chronic obstructive pulmonary disease)   History of DVT (deep vein thrombosis)   Acute on chronic respiratory failure with hypoxia   Bacteremia due to group B Streptococcus   Antineutrophil cytoplasmic antibody (ANCA) positive   Goodpasture's disease   Epidural abscess   Warfarin anticoagulation   Parkinsons disease   Vertebral osteomyelitis   Dysphagia/chronic   Diskitis   Dysphagia causing pulmonary aspiration with swallowing   Constipation    CUTLER SUNDAY is a 69 y.o. male with  Goodpasture's on chronic immunosuppressive therapy found to have GBS bacteremia and diskitis  #1 GBS bacteremia and diskitis with an epidural abscess:  --check 2 d echocardiogram --would plan on 8 weeks of IV rocephin 2 grams daily --he SHOULD HAVE A FOLLOWUP MRI OF HIS L SPINE IN NEXT 6 WEEKS PRIOR TO STOPPING THERAPY TO  ENSURE THAT THE EPIDURAL ABSCESS IS RESOLVED --I would consider further protracted oral abx such as keflex after finishing his IV abx esp if the epidural abscess has not resolved.   --Given that he lives in [place] he can actually followup with Dr Ola Spurr as per Dr. Henreitta Leber last note.    LOS: 4 days   Alcide Evener 02/05/2015, 10:04 PM

## 2015-02-05 NOTE — Progress Notes (Signed)
[facility] PROGRESS NOTE  John Mendoza KPT:[id number] DOB: Aug 14, 1945 DOA: 02/01/2015 PCP: Adin Hector, MD  Assessment/Plan: #1 acute discitis/vertebral osteomyelitis/epidural abscess at L4-L5 Likely secondary to group B strep bacteremia which patient had one week prior to admission. Patient also on chronic prednisone secondary to Goodpasture's. Patient's pneumonia is improving. Patient has been seen in consultation by ID that  recommended holding aspiration because source of infection likely consistent with group B streptococcus bacteremia. Repeat blood cultures were obtained and are pending. Continue IV Rocephin for at least 6-8 weeks per ID recommendations. Increased Duragesic patch to 50 MCG's every 3 days for pain management. IV morphine for when necessary breakthrough pain. Patient needs to follow-up with ID as outpatient.  #2 GpB  streptococcus bacteremia Continue IV Rocephin.  #3 Pneumonia Patient with recent pneumonia approximately week prior to admission. Medical improvement. On IV Rocephin. Continue oxygen, nebulizers.  #4 COPD Stable. Continue Spiriva and dulera.  #5 hypertension Continue IV Lopressor.   #6 coronary artery disease Stable. Due to dysphagia patient unable to take medications orally until PEG tube is placed. Continue IV Lopressor.   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 However due to patient's recent  diagnosis of Parkinson's will need to keep a close eye on patient's balance and carefully evaluate his fall risk. Patients INR was reversed in anticipation of possible interventional radiological procedure which per ID note is not needed at this time. Continue full dose Lovenox due to dysphagia patient unable to take oral intake, while awaiting PEG tube placement.  #8 Parkinson's Disease Sinemet on hold until PEG tube placement and will resume Sinemet .  #9 hypothyroidism Continue IV Synthroid.   #10 history of multiple pulmonary nodules Follow up in the outpatient setting by pulmonary.  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  #12 dysphagia Speech therapy has assessed the patient and patient underwent modified barium swallow and failed. Speech therapy recommending nothing by mouth status. Patient has been seen by interventional radiology and patient scheduled for PEG tube today however could not be done secondary to stool burden. IR to reassess tomorrow.    #13 constipation Will place patient on soapsuds enema. Repeat x-rays for tomorrow.  #14 prophylaxis Lovenox for DVT prophylaxis.  Code Status: Full Family Communication: Updated patient at bedside. No family present. Disposition Plan: Home with home health therapies.    Consultants:  Infectious diseases: Dr. Linus Salmons 02/02/2015  IR  Procedures:  MRI lumbar spine 02/01/2015  MBS  Antibiotics:  IV Rocephin 02/02/2015  HPI/Subjective: Patient states still with pain in back, but tolerable. Patient denies chest pain.  Objective: Filed Vitals:   02/05/15 0518  BP: 159/87  Pulse: 63  Temp: 97.6 F (36.4 C)  Resp: 20    Intake/Output Summary (Last 24 hours) at 02/05/15 1430 Last data filed at 02/05/15 1201  Gross per 24 hour  Intake 2666.45 ml  Output   1500 ml  Net 1166.45 ml   Filed Weights   02/02/15 0523 02/03/15 1700  02/05/15 0518  Weight: 91.2  kg (201 lb 1 oz) 84.959 kg (187 lb 4.8 oz) 83.5 kg (184 lb 1.4 oz)    Exam:   General:  NAD  Cardiovascular: RRR  Respiratory: CTAB  Abdomen: Soft, nontender, nondistended, positive bowel sounds.  Musculoskeletal: No clubbing cyanosis or edema.  Data Reviewed: Basic Metabolic Panel:  Recent Labs Lab 02/01/15 1737 02/02/15 0557 02/03/15 0510 02/04/15 0538 02/04/15 0900 02/05/15 0547  NA 134* 136 136 136  --  136  K 5.1 4.2 4.2 5.6* 4.4 4.4  CL 92* 97* 93* 98*  --  102  CO2 36* 32 37* 30  --  27  GLUCOSE 291* 138* 109* 129*  --  130*  BUN 19 15 14  21*  --  22*  CREATININE 0.91 0.70 0.77 0.77  --  0.72  CALCIUM 9.2 9.1 9.2 9.1  --  8.9  MG  --   --  2.0  --   --   --    Liver Function Tests:  Recent Labs Lab 02/01/15 1737  AST 18  ALT 15*  ALKPHOS 60  BILITOT 0.6  PROT 6.2*  ALBUMIN 2.5*   No results for input(s): LIPASE, AMYLASE in the last 168 hours. No results for input(s): AMMONIA in the last 168 hours. CBC:  Recent Labs Lab 02/01/15 1737 02/02/15 0557 02/03/15 0510 02/04/15 0538 02/05/15 0547  WBC 8.6 8.2 8.3 8.2 9.0  NEUTROABS 8.1*  --   --   --   --   HGB 9.4* 9.9* 9.6* 11.8* 10.6*  HCT 30.3* 31.8* 31.6* 36.0* 34.6*  MCV 76.1* 76.8* 76.9* 75.8* 77.1*  PLT 261 262 280 PLATELET CLUMPS NOTED ON SMEAR, COUNT APPEARS ADEQUATE 306   Cardiac Enzymes: No results for input(s): CKTOTAL, CKMB, CKMBINDEX, TROPONINI in the last 168 hours. BNP (last 3 results)  Recent Labs  01/26/15 1946  BNP 420.0*    ProBNP (last 3 results) No results for input(s): PROBNP in the last 8760 hours.  CBG:  Recent Labs Lab 02/04/15 1144 02/04/15 1654 02/04/15 2127 02/05/15 0751 02/05/15 1231  GLUCAP 128* 126* 154* 125* 96    Recent Results (from the past 240 hour(s))  Blood Culture (routine x 2)     Status: None   Collection Time: 01/26/15  7:46 PM  Result Value Ref Range Status   Specimen Description BLOOD LEFT ASSIST CONTROL  Final   Special  Requests BOTTLES DRAWN AEROBIC AND ANAEROBIC 5CC  Final   Culture  Setup Time   Final    GRAM POSITIVE COCCI IN CHAINS IN BOTH AEROBIC AND ANAEROBIC BOTTLES CRITICAL RESULT CALLED TO, READ BACK BY AND VERIFIED WITH: MYRA FLOWERS,RN AT 0800 01/27/2015 BY JRS    Culture   Final    STREPTOCOCCUS AGALACTIAE Virtually 100% of S. agalactiae (Group B) strains are susceptible to Penicillin.  For Penicillin-allergic patients, Erythromycin (85-95% sensitive) and Clindamycin (80% sensitive) are drugs of choice. Contact microbiology lab to request sensitivities if  needed within 7 days.    Report Status 01/29/2015 FINAL  Final   Organism ID, Bacteria STREPTOCOCCUS AGALACTIAE  Final      Susceptibility   Streptococcus agalactiae - MIC*    VANCOMYCIN <=0.5 SENSITIVE Sensitive     CLINDAMYCIN <=0.25 RESISTANT Resistant     * STREPTOCOCCUS AGALACTIAE  Blood Culture (routine x 2)     Status: None   Collection Time: 01/26/15  7:46 PM  Result Value Ref Range Status   Specimen Description BLOOD LEFT HAND  Final   Special Requests BOTTLES DRAWN AEROBIC AND ANAEROBIC 5CC  Final   Culture  Setup Time   Final    GRAM POSITIVE COCCI IN CHAINS IN BOTH AEROBIC AND ANAEROBIC BOTTLES CRITICAL VALUE NOTED.  VALUE IS CONSISTENT WITH PREVIOUSLY REPORTED AND CALLED VALUE.    Culture   Final    STREPTOCOCCUS AGALACTIAE Virtually 100% of S. agalactiae (Group B) strains are susceptible to Penicillin.  For Penicillin-allergic patients, Erythromycin (85-95% sensitive) and Clindamycin (80% sensitive) are drugs of choice. Contact microbiology lab to request sensitivities if  needed within 7 days.    Report Status 01/29/2015 FINAL  Final   Organism ID, Bacteria STREPTOCOCCUS AGALACTIAE  Final      Susceptibility   Streptococcus agalactiae - MIC*    VANCOMYCIN <=0.5 SENSITIVE Sensitive     CLINDAMYCIN <=0.25 RESISTANT Resistant     * STREPTOCOCCUS AGALACTIAE  MRSA PCR Screening     Status: None   Collection Time:  01/26/15 10:00 PM  Result Value Ref Range Status   MRSA by PCR NEGATIVE NEGATIVE Final    Comment:        The GeneXpert MRSA Assay (FDA approved for NASAL specimens only), is one component of a comprehensive MRSA colonization surveillance program. It is not intended to diagnose MRSA infection nor to guide or monitor treatment for MRSA infections.   Urine culture     Status: None   Collection Time: 01/27/15  4:39 AM  Result Value Ref Range Status   Specimen Description URINE, RANDOM  Final   Special Requests NONE  Final   Culture NO GROWTH 1 DAY  Final   Report Status 01/28/2015 FINAL  Final  Culture, expectorated sputum-assessment     Status: None   Collection Time: 01/28/15  3:05 PM  Result Value Ref Range Status   Specimen Description SPUTUM  Final   Special Requests NONE  Final   Sputum evaluation THIS SPECIMEN IS ACCEPTABLE FOR SPUTUM CULTURE  Final   Report Status 01/28/2015 FINAL  Final  Culture, respiratory (NON-Expectorated)     Status: None   Collection Time: 01/28/15  3:05 PM  Result Value Ref Range Status   Specimen Description SPUTUM  Final   Special Requests NONE Reflexed from [place]  Final   Gram Stain   Final    EXCELLENT SPECIMEN - 90-100% WBCS MANY WBC SEEN MODERATE YEAST FEW GRAM NEGATIVE RODS    Culture MODERATE GROWTH CANDIDA SPECIES, NOT ALBICANS  Final   Report Status 01/31/2015 FINAL  Final  Culture, blood (routine x 2)     Status: None (Preliminary result)   Collection Time: 02/01/15  5:46 PM  Result Value Ref Range Status   Specimen Description BLOOD RIGHT ANTECUBITAL  Final   Special Requests BOTTLES DRAWN AEROBIC AND ANAEROBIC 10CC EACH  Final   Culture NO GROWTH 3 DAYS  Final   Report Status PENDING  Incomplete  Culture, blood (routine x 2)     Status: None (Preliminary result)   Collection Time: 02/01/15  5:55 PM  Result Value Ref Range Status   Specimen Description BLOOD LEFT ANTECUBITAL  Final   Special Requests BOTTLES DRAWN AEROBIC  AND ANAEROBIC 10CC EACH  Final   Culture NO GROWTH 3 DAYS  Final   Report Status PENDING  Incomplete     Studies: Dg Abd 2 Views  02/05/2015   CLINICAL DATA:  Ileus, planned PEG tube placement  EXAM: ABDOMEN - 2 VIEW  COMPARISON:  None  FINDINGS: Significant stool  in transverse and proximal descending colon.  Retained contrast in RIGHT colon through hepatic flexure to proximal transverse colon.  Prominent small bowel loops in the mid abdomen could reflect ileus.  Small amount of stool in rectum.  No evidence of bowel obstruction or bowel wall thickening.  Bones demineralized.  Emphysematous changes with atelectasis and potential scarring at lung bases.  IMPRESSION: Stool in transverse through proximal descending colon and contrast in the RIGHT colon to proximal transverse colon.  Air-filled loops of small bowel in the mid abdomen, overall nonobstructive pattern, question ileus.   Electronically Signed   By: Lavonia Dana M.D.   On: 02/05/2015 09:44    Scheduled Meds: . antiseptic oral rinse  7 mL Mouth Rinse BID  . budesonide  0.25 mg Nebulization BID  . carbidopa-levodopa  2 tablet Oral TID  . cefTRIAXone (ROCEPHIN)  IV  2 g Intravenous Q24H  . fentaNYL  50 mcg Transdermal Q72H  . folic acid (FOLVITE) IVPB  1 mg Intravenous Daily  . insulin aspart  0-20 Units Subcutaneous TID WC  . insulin aspart  0-5 Units Subcutaneous QHS  . lamoTRIgine  25 mg Oral BID  . levothyroxine  68 mcg Intravenous Daily  . methylPREDNISolone (SOLU-MEDROL) injection  60 mg Intravenous Q24H  . metoprolol  5 mg Intravenous 3 times per day  . mometasone-formoterol  2 puff Inhalation BID  . pantoprazole (PROTONIX) IV  40 mg Intravenous Q24H  . PARoxetine  60 mg Oral Daily  . sodium chloride  3 mL Intravenous Q12H  . sorbitol, milk of mag, mineral oil, glycerin (SMOG) enema  960 mL Rectal Once  . tamsulosin  0.4 mg Oral Daily  . thiamine IV  100 mg Intravenous Daily  . tiotropium  18 mcg Inhalation Daily    Continuous Infusions: . sodium chloride 0.9 % 1,000 mL infusion 75 mL/hr at 02/04/15 1054  . sodium chloride 0.9 % 1,000 mL infusion      Principal Problem:   Discitis of lumbar region Active Problems:   Multiple pulmonary nodules   Aspiration pneumonitis   Hypertension   Barrett's esophagus   CAD (coronary artery disease)   COPD (chronic obstructive pulmonary disease)   History of DVT (deep vein thrombosis)   Acute on chronic respiratory failure with hypoxia   Bacteremia due to group B Streptococcus   Antineutrophil cytoplasmic antibody (ANCA) positive   Goodpasture's disease   Epidural abscess   Warfarin anticoagulation   Parkinsons disease   Vertebral osteomyelitis   Dysphagia/chronic   Diskitis   Dysphagia causing pulmonary aspiration with swallowing   Constipation    Time spent: 30 mins    [facility] MD [facility] Pager [phone]. If 7PM-7AM, please contact night-coverage at [email or web address], password [facility] 02/05/2015, 2:30 PM  LOS: 4 days

## 2015-02-05 NOTE — Progress Notes (Signed)
Physical Therapy Treatment Patient Details Name: John Mendoza MRN: [id number] DOB: [birth year] Today's Date: 02/05/2015    History of Present Illness Pt is a 69 y/o M admitted to [facility] on 7/22 2/2 sepsis from aspiration pneumonitis and later found to have B strep bacteremia.  MRI revealed L4-5 discitis and vertebral osteomyelitis w/ epidural abscess.  Pt's PMH includes Barrett esophagus, HTN, CHF, lumbar spinal stenosis, COPD, CAD, DVT, Parkinson's, DMII, anemia, seizures, chronic LBP, depression, gout, anxiety, anterior cervical decom/discectomy fusion, R TKA.    PT Comments    Pt making good progress with mobility.  Follow Up Recommendations  Home health PT     Equipment Recommendations  None recommended by PT    Recommendations for Other Services       Precautions / Restrictions Precautions Precautions: Fall Precaution Comments: educated pt on and practiced back precautions and log roll to dec back pain Restrictions Weight Bearing Restrictions: No    Mobility  Bed Mobility Overal bed mobility: Modified Independent             General bed mobility comments: Pt performed log roll w/ good technique, no physical assist or VCs provided  Transfers Overall transfer level: Needs assistance Equipment used: None Transfers: Sit to/from Stand Sit to Stand: Supervision            Ambulation/Gait Ambulation/Gait assistance: Min guard Ambulation Distance (Feet): 300 Feet Assistive device: None Gait Pattern/deviations: Step-through pattern;Decreased stride length   Gait velocity interpretation: Below normal speed for age/gender General Gait Details: Pt didn't feel he needed the cane.   Stairs            Wheelchair Mobility    Modified Rankin (Stroke Patients Only)       Balance Overall balance assessment: Needs assistance Sitting-balance support: No upper extremity supported;Feet supported Sitting balance-Leahy Scale: Good     Standing balance  support: No upper extremity supported;During functional activity Standing balance-Leahy Scale: Fair                      Cognition Arousal/Alertness: Awake/alert Behavior During Therapy: WFL for tasks assessed/performed Overall Cognitive Status: Within Functional Limits for tasks assessed                      Exercises      General Comments        Pertinent Vitals/Pain      Home Living                      Prior Function            PT Goals (current goals can now be found in the care plan section) Acute Rehab PT Goals Patient Stated Goal: to get better so he can go home and drink coffee on his back porch PT Goal Formulation: With patient Time For Goal Achievement: 02/18/15 Potential to Achieve Goals: Good Progress towards PT goals: Progressing toward goals    Frequency  Min 2X/week    PT Plan Current plan remains appropriate    Co-evaluation             End of Session Equipment Utilized During Treatment: Gait belt;Oxygen Activity Tolerance: Patient tolerated treatment well Patient left: in bed;with call bell/phone within reach     Time: 1339-1359 PT Time Calculation (min) (ACUTE ONLY): 20 min  Charges:  $Gait Training: 8-22 mins  G Codes:      John Mendoza 02/05/2015, 2:07 PM  [facility] PT [phone]

## 2015-02-05 NOTE — Progress Notes (Signed)
Utilization Review completed. Jonika Critz RN BSN CM 

## 2015-02-05 NOTE — Care Management Note (Signed)
Case Management Note  Patient Details  Name: John Mendoza MRN: [id number] Date of Birth: 17-Oct-1945  Subjective/Objective:                    Subjective/Objective:   Katrina Stack, RN Case Manager Signed Nursing Care Management 01/30/2015 5:05 PM    Expand All Collapse All  Patient admitted with respiratory failure due to aspiration pneumonia. Lives at home with his wife . Both use chronic 02 through [facility]. Physical therapy has recommended skilled nursing but patient wishes to return home with home health through [facility]. Says his wife would come and pick him up to transport home. Has a walker and bedside commode. Gave heads up referral to [facility] for SN PT Aide and SLP (for swallowing follow up). Patient says he has been on nectar thick before.            Patient transferred to [facility] from [facility].       Patient to receive PEG tube for feedings today, and PICC line for IV ABX at home.   Action/Plan:  Notified [facility] and [facility] liaisons with [facility] for [place] IV Abx,  RN, PT, HHA, and new tube feeding set up. Will await RD recommendations for feeds.  Expected Discharge Date:                  Expected Discharge Plan:  [place]  In-House Referral:     Discharge planning Services  CM Consult  Post Acute Care Choice:  Durable Medical Equipment, Home Health Choice offered to:  Patient  DME Arranged:  Tube feeding DME Agency:  [place]:    [facility] Agency:  [place]  Status of Service:  In process, will continue to follow  Medicare Important Message Given:    Date Medicare IM Given:    Medicare IM give by:    Date Additional Medicare IM Given:    Additional Medicare Important Message give by:     If discussed at [place] of Stay Meetings, dates discussed:    Additional Comments:  Carles Collet, RN 02/05/2015, 11:43 AM

## 2015-02-05 NOTE — Care Management Important Message (Signed)
Important Message  Patient Details  Name: TRAYVEON BECKFORD MRN: [id number] Date of Birth: January 13, 1946   Medicare Important Message Given:  [facility] notification given    Nathen May 02/05/2015, 12:23 [place] Message  Patient Details  Name: BODI PALMERI MRN: [id number] Date of Birth: 07/12/1945   Medicare Important Message Given:  Yes-second notification given    Nathen May 02/05/2015, 12:23 PM

## 2015-02-05 NOTE — Progress Notes (Signed)
Imaging reviewed today with Dr. Kizzie Ide xray shows significant stool burden in the colon. No obstructive pattern but stool burden involves transverse colon and splenic flexure which limits visualization of stomach under xray and prohibits percutaneous access to stomach.  Recommend bowel prep of some sort, ie Miralax, Golytley, or if pt unable to tolerate po's well, may need multiple enemas. If stool burden improves, can reassess for G-tube placement tomorrow or next day. Check KUB in am to re-eval stool burden.  John Dike PA-C Interventional Radiology 02/05/2015 10:58 AM

## 2015-02-05 NOTE — Progress Notes (Signed)
Initial Nutrition Assessment  DOCUMENTATION CODES:   Not applicable  INTERVENTION:   -Recommend:  Initiate bolus feedings of 355 ml (1.5 cans) of Jevity 1.5 QID  Recommend 125 ml free water flush before and after each administration   Tube feeding regimen provides 2132 kcal (>100% of needs), 92 grams of protein, and 2080 ml of H2O.    NUTRITION DIAGNOSIS:   Inadequate oral intake related to inability to eat as evidenced by NPO status.  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 [facility] from aspiration pneumonitis with secondary group B streptococcal bacteremia. His   Spoke with Dr. Grandville Silos prior to visiting pt; per MD, pt has multiple questions regarding types of feeds available to him as well as the possibility of taking PO. Dr. Grandville Silos reports the pt would like to drink liquids, and will likely be non-compliant, but would like to discuss this with RD.   Hx obtained by pt at bedside. He reports good appetite and no weight loss PTA. Pt shares that he has a long-standing hx of dysphagia and has been consuming thickened liquids for quite some time.   He denies any weight loss. He shares that he has had some muscle loss, but he attributes this to his multiple medical conditions. Nutrition-Focused physical exam completed. Findings are no fat depletion, mild muscle depletion, and no edema.   Pt was very open and honest with this RD. He reveals that his quality of life is very important to him  and expresses frustration with continued admissions for pneumonia and antibiotic use. He reveals that he is still fairly active and likes to go out thrift shopping, painting, and spending time in his yard; he shares with this RD that "I would not want to be hooked up to a pump 24/7 so I can still get out and do the things I enjoy". He also reports he would like to "sip a cup of coffee every once in a while, but not everyday" as he still enjoys the pleasure of eating small amounts of foods and liquids.   This RD discussed options of TF administration, including continuous, nocturnal, and bolus feedings. Pt requesting bolus feedings to best complement his quality of life. Also spent a lot of time discussing the risk of aspiration and further complications with continued PO intake. Also discussed potential to safely take PO in the future with continued support from SLP. Pt is very knolwedgable about these risks, and voiced willingness to accept them. He expressed appreciation to this RD to listening to his wishes, although they are not consistent with medical advice.   Per Dr. Grandville Silos, PEG will likely be placed tomorrow or Wednesday. Per IR notes, there is a large amount of stool in the colon; unable to place PEG today, however, pt will receive bowel prep. Plan to check KUB in AM.   Diet Order:  Diet NPO time specified Except for: Sips with Meds  Skin:  Reviewed, no issues (ecchymosis)  Last BM:  02/02/15  Height:   Ht Readings from Last 1 Encounters:  02/05/15 5' 8.5" (1.74 m)    Weight:   Wt Readings from Last 1 Encounters:  02/05/15 184 lb 1.4 oz (83.5 kg)    Ideal Body Weight:  71.4 kg  BMI:  Body mass index is 27.58 kg/(m^2).  Estimated Nutritional Needs:   Kcal:  1800-2000  Protein:  90-100 grams  Fluid:  1.8-2.0 L  EDUCATION NEEDS:   Education needs addressed  Giulianna Rocha A. Jimmye Norman, RD, LDN, CDE Pager: [phone] After hours Pager: [phone]

## 2015-02-05 NOTE — Progress Notes (Addendum)
ANTICOAGULATION CONSULT NOTE Pharmacy Consult:  Lovenox Indication:  History of DVT  Allergies  Allergen Reactions  . Other Other (See Comments)    Dizziness, lightheadedness, Pt states that inhaled medications make him depressed and have negative thoughts.      Patient Measurements: Height: 5' 8.5" (174 cm) Weight: 184 lb 1.4 oz (83.5 kg) IBW/kg (Calculated) : 69.55  Vital Signs: Temp: 97.6 F (36.4 C) (08/01 0518) Temp Source: Oral (08/01 0518) BP: 159/87 mmHg (08/01 0518) Pulse Rate: 63 (08/01 0518)  Labs:  Recent Labs  02/03/15 0510 02/04/15 0538 02/05/15 0547  HGB 9.6* 11.8* 10.6*  HCT 31.6* 36.0* 34.6*  PLT 280 PLATELET CLUMPS NOTED ON SMEAR, COUNT APPEARS ADEQUATE 306  APTT  --   --  31  LABPROT  --   --  14.7  INR  --   --  1.13  CREATININE 0.77 0.77 0.72    Estimated Creatinine Clearance: 85.8 mL/min (by C-G formula based on Cr of 0.72).  Assessment: 39 YOM with history of DVT in June 2015 on Coumadin PTA.  INR was reversed on admit for potential IR procedure and transitioned to Lovenox as IR procedure is unable to be performed in setting of bacteremia.  Doses of Lovenox were held last night and this morning. However, IR is unable to complete G tube placement as patient has large stool burden. They recommended bowel prep and are repeating a KUB in the mornig.  INR sub-therapeutic at 1.13; no bleeding reported.  Patient renal function has been stable.   Goal of Therapy:  Anti-Xa level 0.6-1 units/ml 4hrs after LMWH dose given Monitor platelets by anticoagulation protocol: Yes     Plan:  - continue to hold Lovenox at this time. Will follow up tomorrow for completion of GTube placement by IR on 8/2 - CBC Q72H while on Lovenox - Monitor renal fxn   John Mendoza, PharmD, BCPS Clinical Pharmacist Pager: [phone] 02/05/2015 2:05 PM

## 2015-02-06 ENCOUNTER — Inpatient Hospital Stay (HOSPITAL_COMMUNITY): Payer: Medicare Other

## 2015-02-06 LAB — BASIC METABOLIC PANEL
ANION GAP: 9 (ref 5–15)
BUN: 16 mg/dL (ref 6–20)
CO2: 32 mmol/L (ref 22–32)
CREATININE: 0.69 mg/dL (ref 0.61–1.24)
Calcium: 8.8 mg/dL — ABNORMAL LOW (ref 8.9–10.3)
Chloride: 97 mmol/L — ABNORMAL LOW (ref 101–111)
GFR calc Af Amer: >60 mL/min (ref >60)
GFR calc non Af Amer: >60 mL/min (ref >60)
Glucose, Bld: 94 mg/dL (ref 65–99)
Potassium: 4.1 mmol/L (ref 3.5–5.1)
SODIUM: 138 mmol/L (ref 135–145)

## 2015-02-06 LAB — CBC
HCT: 34.2 % — ABNORMAL LOW (ref 39.0–52.0)
Hemoglobin: 10.3 g/dL — ABNORMAL LOW (ref 13.0–17.0)
MCH: 23.1 pg — ABNORMAL LOW (ref 26.0–34.0)
MCHC: 30.1 g/dL (ref 30.0–36.0)
MCV: 76.7 fL — ABNORMAL LOW (ref 78.0–100.0)
PLATELETS: 293 10*3/uL (ref 150–400)
RBC: 4.46 MIL/uL (ref 4.22–5.81)
RDW: 17.8 % — ABNORMAL HIGH (ref 11.5–15.5)
WBC: 8.3 10*3/uL (ref 4.0–10.5)

## 2015-02-06 LAB — GLUCOSE, CAPILLARY
Glucose-Capillary: 94 mg/dL (ref 65–99)
Glucose-Capillary: 135 mg/dL — ABNORMAL HIGH (ref 65–99)
Glucose-Capillary: 96 mg/dL (ref 65–99)
Glucose-Capillary: 135 mg/dL — ABNORMAL HIGH (ref 65–99)

## 2015-02-06 LAB — CULTURE, BLOOD (ROUTINE X 2)
CULTURE: NO GROWTH
Culture: NO GROWTH

## 2015-02-06 LAB — MAGNESIUM: MAGNESIUM: 1.8 mg/dL (ref 1.7–2.4)

## 2015-02-06 MED ORDER — MIDAZOLAM HCL 2 MG/2ML IJ SOLN
INTRAMUSCULAR | Status: AC
  Administered 2015-02-06: 11:00:00
  Filled 2015-02-06: qty 2

## 2015-02-06 MED ORDER — ENALAPRILAT 1.25 MG/ML IV SOLN
0.6250 mg | Freq: Four times a day (QID) | INTRAVENOUS | Status: DC
  Administered 2015-02-06 – 2015-02-07 (×4): 0.625 mg via INTRAVENOUS
  Filled 2015-02-06 (×11): qty 0.5

## 2015-02-06 MED ORDER — GLUCAGON HCL RDNA (DIAGNOSTIC) 1 MG IJ SOLR
INTRAMUSCULAR | Status: AC
  Administered 2015-02-06: 11:00:00
  Filled 2015-02-06: qty 1

## 2015-02-06 MED ORDER — FENTANYL CITRATE (PF) 100 MCG/2ML IJ SOLN
INTRAMUSCULAR | Status: AC | PRN
  Administered 2015-02-06: 50 ug via INTRAVENOUS

## 2015-02-06 MED ORDER — ENOXAPARIN SODIUM 80 MG/0.8ML ~~LOC~~ SOLN
80.0000 mg | Freq: Two times a day (BID) | SUBCUTANEOUS | Status: DC
  Administered 2015-02-06 – 2015-02-08 (×4): 80 mg via SUBCUTANEOUS
  Filled 2015-02-06 (×4): qty 0.8

## 2015-02-06 MED ORDER — LIDOCAINE HCL 1 % IJ SOLN
INTRAMUSCULAR | Status: AC
  Administered 2015-02-06: 11:00:00
  Filled 2015-02-06: qty 20

## 2015-02-06 MED ORDER — FENTANYL CITRATE (PF) 100 MCG/2ML IJ SOLN
INTRAMUSCULAR | Status: AC
  Administered 2015-02-06: 11:00:00
  Filled 2015-02-06: qty 2

## 2015-02-06 MED ORDER — IOHEXOL 300 MG/ML  SOLN
50.0000 mL | Freq: Once | INTRAMUSCULAR | Status: AC | PRN
  Administered 2015-02-06: 10 mL

## 2015-02-06 MED ORDER — MIDAZOLAM HCL 2 MG/2ML IJ SOLN
INTRAMUSCULAR | Status: AC | PRN
  Administered 2015-02-06: 1 mg via INTRAVENOUS

## 2015-02-06 NOTE — Progress Notes (Signed)
[facility]  Patient Status:   New pt for [facility] this admission  [facility] is providing the following services: HHRN, [place], [facility] for home IV ABX.  [facility] team will work with pt, family and physician team to support transition home.   If patient discharges after hours, please call [phone].   Larry Sierras 02/06/2015, 11:41 PM

## 2015-02-06 NOTE — Procedures (Signed)
Successful 20 Fr pull through Gtube No comp Stable Full use tomorrow Full report in PACS

## 2015-02-06 NOTE — Progress Notes (Signed)
Speech Language Pathology Treatment: Dysphagia  Patient Details Name: John Mendoza MRN: [id number] DOB: 06-04-1946 Today's Date: 02/06/2015 Time: 0388-8280 SLP Time Calculation (min) (ACUTE ONLY): 23 min  Assessment / Plan / Recommendation Clinical Impression  Pt seen for education following MBS this weekend. Pt shares that the results seemed grossly similar to previous swallow studies at outside facilities, at which time he was told that "everything went the wrong way", with one facility recommending thin liquids and another recommending nectar thick liquids. He also admits that while he is agreeable to using his PEG, he wants to continue to drink his coffee in the mornings and would like to have small amounts of POs for quality of life. Pilar Plate conversation was had regarding the risks of aspiration if continuing to take POs. Pt may benefit from further discussion with MD regarding his [place] given that his dysphagia is a chronic issue.   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 Patient was recently admitted to [facility] on 7/22 secondary to sepsis from aspiration pneumonitis and later found to have group B streptococcal bacteremia. MRI of the lumbar spine without contrast revealed L4-L5 discitis and vertebral osteomyelitis with epidural abscess. Pt has a h/o chronic dysphagia s/p XRT and surgical intervention for throat cancer, s/p ACDF, and possibly impacted by PD. Pt has had previous swallowing evaluations at outside facilities, most recently recommending nectar thick liquids.   Pertinent Vitals Pain Assessment: Faces Faces Pain Scale: No hurt  SLP Plan  Continue with current plan of care    Recommendations  Diet recommendations: NPO Medication Administration: Via alternative means       Oral Care Recommendations: Oral care QID Follow up Recommendations: Home health SLP Plan: Continue with current plan of care    Germain Osgood, M.A. CCC-SLP [phone]  Germain Osgood 02/06/2015, 3:18 PM

## 2015-02-06 NOTE — Progress Notes (Signed)
Physical Therapy Treatment Patient Details Name: John Mendoza MRN: [id number] DOB: 1946/03/06 Today's Date: 02-16-15    History of Present Illness Pt is a 69 y/o M admitted to [facility] on 7/22 2/2 sepsis from aspiration pneumonitis and later found to have B strep bacteremia.  MRI revealed L4-5 discitis and vertebral osteomyelitis w/ epidural abscess.  Pt's PMH includes Barrett esophagus, HTN, CHF, lumbar spinal stenosis, COPD, CAD, DVT, Parkinson's, DMII, anemia, seizures, chronic LBP, depression, gout, anxiety, anterior cervical decom/discectomy fusion, R TKA.    PT Comments    Pt making good progress.  Follow Up Recommendations  Home health PT     Equipment Recommendations  None recommended by PT    Recommendations for Other Services       Precautions / Restrictions Precautions Precautions: Fall Precaution Comments: educated pt on and practiced back precautions and log roll to dec back pain Restrictions Weight Bearing Restrictions: No    Mobility  Bed Mobility Overal bed mobility: Modified Independent             General bed mobility comments: Pt performed log roll w/ good technique, no physical assist or VCs provided  Transfers Overall transfer level: Needs assistance Equipment used: None Transfers: Sit to/from Stand Sit to Stand: Supervision            Ambulation/Gait Ambulation/Gait assistance: Supervision Ambulation Distance (Feet): 500 Feet Assistive device: None Gait Pattern/deviations: Step-through pattern   Gait velocity interpretation: Below normal speed for age/gender     Stairs            Wheelchair Mobility    Modified Rankin (Stroke Patients Only)       Balance Overall balance assessment: Needs assistance Sitting-balance support: No upper extremity supported Sitting balance-Leahy Scale: Good     Standing balance support: No upper extremity supported Standing balance-Leahy Scale: Fair                       Cognition Arousal/Alertness: Awake/alert Behavior During Therapy: WFL for tasks assessed/performed Overall Cognitive Status: Within Functional Limits for tasks assessed                      Exercises      General Comments        Pertinent Vitals/Pain Pain Assessment: Faces Faces Pain Scale: No hurt    Home Living                      Prior Function            PT Goals (current goals can now be found in the care plan section) Acute Rehab PT Goals Patient Stated Goal: to get better so he can go home and drink coffee on his back porch PT Goal Formulation: With patient Time For Goal Achievement: 02/18/15 Potential to Achieve Goals: Good Progress towards PT goals: Progressing toward goals    Frequency  Min 2X/week    PT Plan Current plan remains appropriate    Co-evaluation             End of Session Equipment Utilized During Treatment: Oxygen Activity Tolerance: Patient tolerated treatment well Patient left: in bed;with call bell/phone within reach     Time:  -     Charges:  $Gait Training: 23-37 mins                    G Codes:      John Mendoza 02-16-15, 3:53  PM  [employer] PT [phone]

## 2015-02-06 NOTE — Progress Notes (Signed)
ANTICOAGULATION CONSULT NOTE Pharmacy Consult:  Lovenox Indication:  History of DVT  Allergies  Allergen Reactions  . Other Other (See Comments)    Dizziness, lightheadedness, Pt states that inhaled medications make him depressed and have negative thoughts.      Patient Measurements: Height: 5' 8.5" (174 cm) Weight: 184 lb 15.5 oz (83.9 kg) IBW/kg (Calculated) : 69.55  Vital Signs: Temp: 97.7 F (36.5 C) (08/02 0534) Temp Source: Oral (08/02 0534) BP: 192/86 mmHg (08/02 1050) Pulse Rate: 71 (08/02 1050)  Labs:  Recent Labs  02/04/15 0538 02/05/15 0547 02/06/15 0600  HGB 11.8* 10.6* 10.3*  HCT 36.0* 34.6* 34.2*  PLT PLATELET CLUMPS NOTED ON SMEAR, COUNT APPEARS ADEQUATE 306 293  APTT  --  31  --   LABPROT  --  14.7  --   INR  --  1.13  --   CREATININE 0.77 0.72 0.69    Estimated Creatinine Clearance: 92.8 mL/min (by C-G formula based on Cr of 0.69).  Assessment: 82 YOM with history of DVT in June 2015 on Coumadin PTA.  INR was reversed on admit for potential IR procedure and transitioned to Lovenox as IR procedure is unable to be performed in setting of bacteremia.  Doses of Lovenox were held 7/31 PM, and all of 8/1 in anticipation of Gtube placement.  Gtube placed this morning by radiology.  INR sub-therapeutic at 1.13; no bleeding reported.  Patient renal function has been stable.  Goal of Therapy:  Anti-Xa level 0.6-1 units/ml 4hrs after LMWH dose given Monitor platelets by anticoagulation protocol: Yes    Plan:  - restart Lovenox 80mg  subQ q12h today at noon - CBC Q72H while on Lovenox - Monitor renal fxn - follow up ability to start giving meds per tube (per radiology, can be used starting 8/3)- anticipate restart of warfarin   Yareliz Thorstenson D. Branson Kranz, PharmD, BCPS Clinical Pharmacist Pager: [phone] 02/06/2015 10:59 AM

## 2015-02-06 NOTE — Progress Notes (Signed)
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 Patient also on chronic prednisone secondary to Goodpasture's. Patient's pneumonia is improving. Patient has been seen in consultation by ID that  recommended holding aspiration because source of infection likely consistent with group B streptococcus bacteremia. Repeat blood cultures were obtained and are pending. Continue IV Rocephin for at least 6-8 weeks per ID recommendations. Increased Duragesic patch to 50 MCG's every 3 days for pain management. IV morphine for when necessary breakthrough pain. Patient needs to follow-up with ID as outpatient. Patient will need PICC line placement. Will need to discuss with ID based on blood cultures as to when PICC line, replaced hopefully tomorrow 02/07/2015.  #2 GpB  streptococcus bacteremia Continue IV Rocephin.  #3 Pneumonia Patient with recent pneumonia approximately week prior to admission. Medical improvement. On IV Rocephin. Continue oxygen, nebulizers.  #4 COPD Stable. Continue Spiriva and dulera.  #5 hypertension Continue IV Lopressor. Will add IV enalapril for better blood pressure control. Change back to home dose oral medications once PEG tube is functional and can be used tomorrow.  #6 coronary artery disease Stable. Due to dysphagia patient unable to take medications orally until PEG tube is placed. Continue IV Lopressor.   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 However due to patient's recent diagnosis of Parkinson's will need to keep a close eye on patient's balance and carefully evaluate his fall risk. Patients INR was reversed in anticipation of possible interventional radiological procedure which per ID note is not needed at this time. Continue full dose Lovenox due to dysphagia patient unable to take oral intake, while awaiting PEG tube placement. Once PEG tube is functional and able to be used tomorrow to start patient back on Coumadin.  #8 Parkinson's Disease Sinemet on hold until PEG tube functional and will resume Sinemet .  #9 hypothyroidism Continue IV Synthroid. Change back to oral Synthroid once PEG tube is functional.  #10 history of multiple pulmonary nodules Follow up in the outpatient setting by pulmonary.  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 Change back to home dose oral prednisone once PEG tube is functional.  #12 dysphagia Speech therapy has assessed the patient and patient underwent modified barium swallow and failed. Speech therapy recommending nothing by mouth status. Patient has been seen by interventional radiology and patient s/p PEG tube today. PEG tube can be used starting tomorrow per IR.   #13 constipation Status post enema with good bowel movement per patient.  #14 prophylaxis Lovenox for DVT prophylaxis.  Code Status: Full Family Communication: Updated patient at bedside. No family present. Disposition Plan: Home with home health therapies, after PEG tube feedings have been initiated and medications started.   Consultants:  Infectious diseases: Dr. Linus Salmons  02/02/2015  IR  Procedures:  MRI lumbar spine 02/01/2015  MBS  PEG tube 02/06/2015   Antibiotics:  IV Rocephin 02/02/2015  HPI/Subjective: Patient states still with pain in back, but tolerable. Patient denies chest pain. Patient states had bowel movements last night after enema.  Objective: Filed Vitals:   02/06/15 1400  BP: 166/78  Pulse: 62  Temp: 98.9 F (37.2 C)  Resp: 19    Intake/Output Summary (Last 24 hours) at 02/06/15 1735 Last data filed at 02/06/15 1526  Gross per 24 hour  Intake 1000.2 ml  Output   1150 ml  Net -149.8 ml   Filed Weights   02/03/15 0608 02/05/15 0518 02/06/15 0534  Weight: 84.959 kg (187 lb 4.8 oz) 83.5 kg (184 lb 1.4 oz) 83.9 kg (184 lb 15.5 oz)    Exam:   General:  NAD  Cardiovascular: RRR  Respiratory: CTAB  Abdomen: Soft, nontender, nondistended, positive bowel sounds.  Musculoskeletal: No clubbing cyanosis or edema.  Data Reviewed: Basic Metabolic Panel:  Recent Labs Lab 02/02/15 0557 02/03/15 0510 02/04/15 0538 02/04/15 0900 02/05/15 0547 02/06/15 0600  NA 136 136 136  --  136 138  K 4.2 4.2 5.6* 4.4 4.4 4.1  CL 97* 93* 98*  --  102 97*  CO2 32 37* 30  --  27 32  GLUCOSE 138* 109* 129*  --  130* 94  BUN 15 14 21*  --  22* 16  CREATININE 0.70 0.77 0.77  --  0.72 0.69  CALCIUM 9.1 9.2 9.1  --  8.9 8.8*  MG  --  2.0  --   --   --  1.8   Liver Function Tests:  Recent Labs Lab 02/01/15 1737  AST 18  ALT 15*  ALKPHOS 60  BILITOT 0.6  PROT 6.2*  ALBUMIN 2.5*   No results for input(s): LIPASE, AMYLASE in the last 168 hours. No results for input(s): AMMONIA in the last 168 hours. CBC:  Recent Labs Lab 02/01/15 1737 02/02/15 0557 02/03/15 0510 02/04/15 0538 02/05/15 0547 02/06/15 0600  WBC 8.6 8.2 8.3 8.2 9.0 8.3  NEUTROABS 8.1*  --   --   --   --   --   HGB 9.4* 9.9* 9.6* 11.8* 10.6* 10.3*  HCT 30.3* 31.8* 31.6* 36.0* 34.6* 34.2*  MCV 76.1* 76.8* 76.9* 75.8* 77.1* 76.7*  PLT 261 262 280  PLATELET CLUMPS NOTED ON SMEAR, COUNT APPEARS ADEQUATE 306 293   Cardiac Enzymes: No results for input(s): CKTOTAL, CKMB, CKMBINDEX, TROPONINI in the last 168 hours. BNP (last 3 results)  Recent Labs  01/26/15 1946  BNP 420.0*    ProBNP (last 3 results) No results for input(s): PROBNP in the last 8760 hours.  CBG:  Recent Labs Lab 02/05/15 1718 02/05/15 2114 02/06/15 0818 02/06/15 1146 02/06/15 1657  GLUCAP 204* 122* 94 135* 96    Recent Results (from the past 240 hour(s))  Culture, expectorated sputum-assessment     Status: None   Collection Time: 01/28/15  3:05 PM  Result Value Ref Range Status   Specimen Description SPUTUM  Final   Special Requests NONE  Final   Sputum evaluation THIS SPECIMEN IS ACCEPTABLE FOR SPUTUM CULTURE  Final   Report Status 01/28/2015 FINAL  Final  Culture, respiratory (NON-Expectorated)     Status: None   Collection Time: 01/28/15  3:05 PM  Result Value Ref Range Status   Specimen Description SPUTUM  Final   Special Requests NONE Reflexed from [id number]  Final   Gram Stain   Final    EXCELLENT SPECIMEN -  90-100% WBCS MANY WBC SEEN MODERATE YEAST FEW GRAM NEGATIVE RODS    Culture MODERATE GROWTH CANDIDA SPECIES, NOT ALBICANS  Final   Report Status 01/31/2015 FINAL  Final  Culture, blood (routine x 2)     Status: None   Collection Time: 02/01/15  5:46 PM  Result Value Ref Range Status   Specimen Description BLOOD RIGHT ANTECUBITAL  Final   Special Requests BOTTLES DRAWN AEROBIC AND ANAEROBIC 10CC EACH  Final   Culture NO GROWTH 5 DAYS  Final   Report Status 02/06/2015 FINAL  Final  Culture, blood (routine x 2)     Status: None   Collection Time: 02/01/15  5:55 PM  Result Value Ref Range Status   Specimen Description BLOOD LEFT ANTECUBITAL  Final   Special Requests BOTTLES DRAWN AEROBIC AND ANAEROBIC 10CC EACH  Final   Culture NO GROWTH 5 DAYS  Final   Report Status 02/06/2015 FINAL  Final     Studies: Dg Abd 1 View  02/06/2015    CLINICAL DATA:  Constipation.  Evaluate for PEG tube placement.  EXAM: ABDOMEN - 1 VIEW  COMPARISON:  02/05/2015  FINDINGS: There is contrast in nondilated loops of colon. Significant stool burden noted. No evidence for large or small bowel obstruction. Degenerative changes seen in the lower thoracic and lumbar spine.  IMPRESSION: Significant retained contrast in the colon. Significant stool burden.   Electronically Signed   By: Nolon Nations M.D.   On: 02/06/2015 11:35   Ir Gastrostomy Tube Mod Sed  02/06/2015   CLINICAL DATA:  Dysphagia, head neck cancer, status post radiation, failed swallowing exam  EXAM: FLUOROSCOPIC 20 FRENCH PULL-THROUGH GASTROSTOMY  Date:  8/2/20168/08/2014 10:47 am  Radiologist:  M. Daryll Brod, MD  Guidance:  Fluoroscopic  FLUOROSCOPY TIME:  1 minutes 54 seconds, 60 mGy  MEDICATIONS AND MEDICAL HISTORY: Patient is already receiving Rocephin IV daily.1 mg Versed, 50 mcg fentanyl  ANESTHESIA/SEDATION: 7 minutes  CONTRAST:  60mL OMNIPAQUE IOHEXOL 300 MG/ML  SOLN  COMPLICATIONS: None immediate  PROCEDURE: Informed consent was obtained from the patient following explanation of the procedure, risks, benefits and alternatives. The patient understands, agrees and consents for the procedure. All questions were addressed. A time out was performed.  Maximal barrier sterile technique utilized including caps, mask, sterile gowns, sterile gloves, large sterile drape, hand hygiene, and betadine prep.  The left upper quadrant was sterilely prepped and draped. An oral gastric catheter was inserted into the stomach under fluoroscopy. The existing nasogastric feeding tube was removed. Air was injected into the stomach for insufflation and visualization under fluoroscopy. The air distended stomach was confirmed beneath the anterior abdominal wall in the frontal and lateral projections. Under sterile conditions and local anesthesia, a 25 gauge trocar needle was utilized to access the stomach  percutaneously beneath the left subcostal margin. Needle position was confirmed within the stomach under biplane fluoroscopy. Contrast injection confirmed position also. A single T tack was deployed for gastropexy. Over an Amplatz guide wire, a 9-French sheath was inserted into the stomach. A snare device was utilized to capture the oral gastric catheter. The snare device was pulled retrograde from the stomach up the esophagus and out the oropharynx. The 20-French pull-through gastrostomy was connected to the snare device and pulled antegrade through the oropharynx down the esophagus into the stomach and then through the percutaneous tract external to the patient. The gastrostomy was assembled externally. Contrast injection confirms position in the stomach. Images were obtained for documentation. The patient tolerated  procedure well. No immediate complication.  IMPRESSION: Fluoroscopic insertion of a 20-French "pull-through" gastrostomy.   Electronically Signed   By: Jerilynn Mages.  Shick M.D.   On: 02/06/2015 13:08   Dg Abd 2 Views  02/05/2015   CLINICAL DATA:  Ileus, planned PEG tube placement  EXAM: ABDOMEN - 2 VIEW  COMPARISON:  None  FINDINGS: Significant stool in transverse and proximal descending colon.  Retained contrast in RIGHT colon through hepatic flexure to proximal transverse colon.  Prominent small bowel loops in the mid abdomen could reflect ileus.  Small amount of stool in rectum.  No evidence of bowel obstruction or bowel wall thickening.  Bones demineralized.  Emphysematous changes with atelectasis and potential scarring at lung bases.  IMPRESSION: Stool in transverse through proximal descending colon and contrast in the RIGHT colon to proximal transverse colon.  Air-filled loops of small bowel in the mid abdomen, overall nonobstructive pattern, question ileus.   Electronically Signed   By: Lavonia Dana M.D.   On: 02/05/2015 09:44    Scheduled Meds: . antiseptic oral rinse  7 mL Mouth Rinse BID  .  budesonide  0.25 mg Nebulization BID  . carbidopa-levodopa  2 tablet Oral TID  . cefTRIAXone (ROCEPHIN)  IV  2 g Intravenous Q24H  . enalaprilat  0.625 mg Intravenous 4 times per day  . enoxaparin (LOVENOX) injection  80 mg Subcutaneous Q12H  . fentaNYL  50 mcg Transdermal Q72H  . folic acid (FOLVITE) IVPB  1 mg Intravenous Daily  . insulin aspart  0-20 Units Subcutaneous TID WC  . insulin aspart  0-5 Units Subcutaneous QHS  . lamoTRIgine  25 mg Oral BID  . levothyroxine  68 mcg Intravenous Daily  . methylPREDNISolone (SOLU-MEDROL) injection  40 mg Intravenous Q24H  . metoprolol  5 mg Intravenous 3 times per day  . mometasone-formoterol  2 puff Inhalation BID  . pantoprazole (PROTONIX) IV  40 mg Intravenous Q24H  . PARoxetine  60 mg Oral Daily  . sodium chloride  3 mL Intravenous Q12H  . tamsulosin  0.4 mg Oral Daily  . thiamine IV  100 mg Intravenous Daily  . tiotropium  18 mcg Inhalation Daily   Continuous Infusions: . sodium chloride 0.9 % 1,000 mL infusion 75 mL/hr at 02/04/15 1054    Principal Problem:   Discitis of lumbar region Active Problems:   Multiple pulmonary nodules   Aspiration pneumonitis   Hypertension   Barrett's esophagus   CAD (coronary artery disease)   COPD (chronic obstructive pulmonary disease)   History of DVT (deep vein thrombosis)   Acute on chronic respiratory failure with hypoxia   Bacteremia due to group B Streptococcus   Antineutrophil cytoplasmic antibody (ANCA) positive   Goodpasture's disease   Epidural abscess   Warfarin anticoagulation   Parkinsons disease   Vertebral osteomyelitis   Dysphagia/chronic   Diskitis   Dysphagia causing pulmonary aspiration with swallowing   Constipation    Time spent: 30 mins    [facility] MD [facility] Pager [phone]. If 7PM-7AM, please contact night-coverage at [email or web address], password [facility] 02/06/2015, 5:35 PM  LOS: 5 days

## 2015-02-06 NOTE — Progress Notes (Signed)
OT Cancellation Note  Patient Details Name: John Mendoza MRN: [id number] DOB: Jan 15, 1946   Cancelled Treatment:    Reason Eval/Treat Not Completed: Other (comment). SLP in room with pt.    Benito Mccreedy OTR/L [phone] 02/06/2015, 2:47 PM

## 2015-02-07 ENCOUNTER — Inpatient Hospital Stay (HOSPITAL_COMMUNITY): Payer: Medicare Other

## 2015-02-07 DIAGNOSIS — R509 Fever, unspecified: Secondary | ICD-10-CM

## 2015-02-07 DIAGNOSIS — B951 Streptococcus, group B, as the cause of diseases classified elsewhere: Secondary | ICD-10-CM

## 2015-02-07 DIAGNOSIS — R1310 Dysphagia, unspecified: Secondary | ICD-10-CM

## 2015-02-07 LAB — CBC
HEMATOCRIT: 35.9 % — AB (ref 39.0–52.0)
HEMOGLOBIN: 11.5 g/dL — AB (ref 13.0–17.0)
MCH: 24.4 pg — AB (ref 26.0–34.0)
MCHC: 32 g/dL (ref 30.0–36.0)
MCV: 76.1 fL — ABNORMAL LOW (ref 78.0–100.0)
PLATELETS: 260 10*3/uL (ref 150–400)
RBC: 4.72 MIL/uL (ref 4.22–5.81)
RDW: 17.7 % — ABNORMAL HIGH (ref 11.5–15.5)
WBC: 10.1 10*3/uL (ref 4.0–10.5)

## 2015-02-07 LAB — BASIC METABOLIC PANEL
ANION GAP: 8 (ref 5–15)
BUN: 16 mg/dL (ref 6–20)
CHLORIDE: 98 mmol/L — AB (ref 101–111)
CO2: 31 mmol/L (ref 22–32)
CREATININE: 0.81 mg/dL (ref 0.61–1.24)
Calcium: 8.8 mg/dL — ABNORMAL LOW (ref 8.9–10.3)
GFR calc Af Amer: >60 mL/min (ref >60)
GFR calc non Af Amer: >60 mL/min (ref >60)
GLUCOSE: 89 mg/dL (ref 65–99)
Potassium: 4.2 mmol/L (ref 3.5–5.1)
SODIUM: 137 mmol/L (ref 135–145)

## 2015-02-07 LAB — GLUCOSE, CAPILLARY
GLUCOSE-CAPILLARY: 98 mg/dL (ref 65–99)
Glucose-Capillary: 80 mg/dL (ref 65–99)
Glucose-Capillary: 281 mg/dL — ABNORMAL HIGH (ref 65–99)
Glucose-Capillary: 227 mg/dL — ABNORMAL HIGH (ref 65–99)

## 2015-02-07 MED ORDER — FREE WATER
125.0000 mL | Freq: Four times a day (QID) | Status: DC
  Administered 2015-02-07 – 2015-02-08 (×5): 125 mL

## 2015-02-07 MED ORDER — PAROXETINE HCL 20 MG PO TABS
60.0000 mg | ORAL_TABLET | Freq: Every day | ORAL | Status: DC
  Administered 2015-02-08: 60 mg
  Filled 2015-02-07: qty 3

## 2015-02-07 MED ORDER — PREDNISONE 20 MG PO TABS: 20.0000 mg | ORAL_TABLET | Freq: Every day | ORAL | Status: DC

## 2015-02-07 MED ORDER — JEVITY 1.2 CAL PO LIQD
ORAL | Status: AC
  Filled 2015-02-07: qty 237

## 2015-02-07 MED ORDER — LAMOTRIGINE 25 MG PO TABS
25.0000 mg | ORAL_TABLET | Freq: Two times a day (BID) | ORAL | Status: DC
  Administered 2015-02-07 – 2015-02-08 (×2): 25 mg
  Filled 2015-02-07 (×2): qty 1

## 2015-02-07 MED ORDER — LEVOTHYROXINE SODIUM 25 MCG PO TABS: 137.0000 ug | ORAL_TABLET | Freq: Every day | ORAL | Status: DC

## 2015-02-07 MED ORDER — ATORVASTATIN CALCIUM 40 MG PO TABS: 40.0000 mg | ORAL_TABLET | Freq: Every day | ORAL | Status: DC

## 2015-02-07 MED ORDER — ACETAMINOPHEN 325 MG PO TABS: 650.0000 mg | ORAL_TABLET | Freq: Four times a day (QID) | ORAL | Status: DC | PRN

## 2015-02-07 MED ORDER — ISOSORBIDE MONONITRATE ER 30 MG PO TB24: 30.0000 mg | ORAL_TABLET | Freq: Every day | ORAL | Status: DC

## 2015-02-07 MED ORDER — ONDANSETRON HCL 4 MG/2ML IJ SOLN: 4.0000 mg | Freq: Four times a day (QID) | INTRAMUSCULAR | Status: DC | PRN

## 2015-02-07 MED ORDER — ONDANSETRON HCL 4 MG PO TABS: 4.0000 mg | ORAL_TABLET | Freq: Four times a day (QID) | ORAL | Status: DC | PRN

## 2015-02-07 MED ORDER — LOSARTAN POTASSIUM 50 MG PO TABS
100.0000 mg | ORAL_TABLET | Freq: Every day | ORAL | Status: DC
  Administered 2015-02-07: 100 mg via ORAL
  Filled 2015-02-07: qty 2

## 2015-02-07 MED ORDER — PREDNISONE 20 MG PO TABS: 40.0000 mg | ORAL_TABLET | Freq: Every day | ORAL | Status: DC

## 2015-02-07 MED ORDER — AMLODIPINE BESYLATE 5 MG PO TABS
5.0000 mg | ORAL_TABLET | Freq: Every day | ORAL | Status: DC
  Administered 2015-02-07 – 2015-02-08 (×2): 5 mg
  Filled 2015-02-07: qty 1

## 2015-02-07 MED ORDER — PREDNISONE 20 MG PO TABS
20.0000 mg | ORAL_TABLET | Freq: Every day | ORAL | Status: DC
  Administered 2015-02-07 – 2015-02-08 (×2): 20 mg
  Filled 2015-02-07 (×2): qty 1

## 2015-02-07 MED ORDER — LOSARTAN POTASSIUM 50 MG PO TABS
100.0000 mg | ORAL_TABLET | Freq: Every day | ORAL | Status: DC
  Administered 2015-02-08: 100 mg
  Filled 2015-02-07: qty 2

## 2015-02-07 MED ORDER — PANTOPRAZOLE SODIUM 40 MG PO PACK
40.0000 mg | PACK | Freq: Every day | ORAL | Status: DC
  Administered 2015-02-07 – 2015-02-08 (×2): 40 mg
  Filled 2015-02-07 (×2): qty 20

## 2015-02-07 MED ORDER — BISOPROLOL FUMARATE 10 MG PO TABS
10.0000 mg | ORAL_TABLET | Freq: Every day | ORAL | Status: DC
  Administered 2015-02-07: 10 mg
  Filled 2015-02-07 (×2): qty 1

## 2015-02-07 MED ORDER — CARBIDOPA-LEVODOPA 25-100 MG PO TABS
2.0000 | ORAL_TABLET | Freq: Three times a day (TID) | ORAL | Status: DC
  Administered 2015-02-07 – 2015-02-08 (×3): 2
  Filled 2015-02-07 (×3): qty 2

## 2015-02-07 MED ORDER — PANTOPRAZOLE SODIUM 40 MG PO TBEC: 40.0000 mg | DELAYED_RELEASE_TABLET | Freq: Every day | ORAL | Status: DC

## 2015-02-07 MED ORDER — JEVITY 1.5 CAL/FIBER PO LIQD
355.0000 mL | Freq: Four times a day (QID) | ORAL | Status: DC
  Administered 2015-02-07 – 2015-02-08 (×5): 355 mL
  Filled 2015-02-07 (×11): qty 1000

## 2015-02-07 MED ORDER — WARFARIN SODIUM 7.5 MG PO TABS
7.5000 mg | ORAL_TABLET | Freq: Once | ORAL | Status: AC
  Administered 2015-02-07: 7.5 mg via ORAL
  Filled 2015-02-07: qty 1

## 2015-02-07 MED ORDER — ATORVASTATIN CALCIUM 40 MG PO TABS
40.0000 mg | ORAL_TABLET | Freq: Every day | ORAL | Status: DC
  Administered 2015-02-07: 40 mg
  Filled 2015-02-07: qty 1

## 2015-02-07 MED ORDER — ACETAMINOPHEN 650 MG RE SUPP: 650.0000 mg | Freq: Four times a day (QID) | RECTAL | Status: DC | PRN

## 2015-02-07 MED ORDER — ISOSORBIDE DINITRATE 5 MG PO TABS
15.0000 mg | ORAL_TABLET | Freq: Two times a day (BID) | ORAL | Status: DC
  Administered 2015-02-07 – 2015-02-08 (×3): 15 mg
  Filled 2015-02-07 (×4): qty 1

## 2015-02-07 MED ORDER — BISOPROLOL FUMARATE 10 MG PO TABS
10.0000 mg | ORAL_TABLET | Freq: Every day | ORAL | Status: DC
  Filled 2015-02-07: qty 1

## 2015-02-07 MED ORDER — TIZANIDINE HCL 4 MG PO TABS
4.0000 mg | ORAL_TABLET | Freq: Three times a day (TID) | ORAL | Status: DC | PRN
  Administered 2015-02-07 – 2015-02-08 (×2): 4 mg
  Filled 2015-02-07 (×3): qty 1

## 2015-02-07 MED ORDER — WARFARIN - PHARMACIST DOSING INPATIENT: Freq: Every day | Status: DC

## 2015-02-07 MED ORDER — AMLODIPINE BESYLATE 5 MG PO TABS
5.0000 mg | ORAL_TABLET | Freq: Every day | ORAL | Status: DC
  Filled 2015-02-07: qty 1

## 2015-02-07 MED ORDER — LEVOTHYROXINE SODIUM 25 MCG PO TABS
137.0000 ug | ORAL_TABLET | Freq: Every day | ORAL | Status: DC
  Administered 2015-02-08: 137 ug
  Filled 2015-02-07 (×2): qty 1

## 2015-02-07 NOTE — Progress Notes (Signed)
ANTICOAGULATION CONSULT NOTE  Pharmacy Consult:  Lovenox and warfarin Indication:  History of DVT  Allergies  Allergen Reactions  . Other Other (See Comments)    Dizziness, lightheadedness, Pt states that inhaled medications make him depressed and have negative thoughts.      Patient Measurements: Height: 5' 8.5" (174 cm) Weight: 181 lb 3.5 oz (82.2 kg) IBW/kg (Calculated) : 69.55  Vital Signs: Temp: 97.9 F (36.6 C) (08/03 0532) Temp Source: Oral (08/03 0532) BP: 176/83 mmHg (08/03 0600) Pulse Rate: 68 (08/03 0532)  Labs:  Recent Labs  02/05/15 0547 02/06/15 0600 02/07/15 0542  HGB 10.6* 10.3* 11.5*  HCT 34.6* 34.2* 35.9*  PLT 306 293 260  APTT 31  --   --   LABPROT 14.7  --   --   INR 1.13  --   --   CREATININE 0.72 0.69 0.81    Estimated Creatinine Clearance: 84.7 mL/min (by C-G formula based on Cr of 0.81).  Assessment: 50 YOM with history of DVT in June 2015 on Coumadin PTA.  INR was reversed on admit for potential IR procedure and transitioned to Lovenox as IR procedure is unable to be performed in setting of bacteremia.  Doses of Lovenox were held 7/31 PM, and all of 8/1 in anticipation of Gtube placement.  Gtube placed 8/2 AM by radiology. Restarting medications today per tube, therefore warfarin reordered.  Last INR sub-therapeutic at 1.13; no bleeding reported.  Patient renal function has been stable. Home dose of warfarin 4mg  daily.  Goal of Therapy:  Anti-Xa level 0.6-1 units/ml 4hrs after LMWH dose given Monitor platelets by anticoagulation protocol: Yes    Plan:  - warfarin 7.5mg  po x1 tonight  - Lovenox 80mg  subQ q12h - CBC Q72H while on Lovenox, daily INR - Monitor renal fxn  Irem Stoneham D. Keisuke Hollabaugh, PharmD, BCPS Clinical Pharmacist Pager: [phone] 02/07/2015 10:14 AM

## 2015-02-07 NOTE — Progress Notes (Signed)
[place] for Infectious Disease    Subjective: No new complaints   Antibiotics:  Anti-infectives    Start     Dose/Rate Route Frequency Ordered Stop   02/02/15 1500  cefTRIAXone (ROCEPHIN) 1 g in dextrose 5 % 50 mL IVPB  Status:  Discontinued     1 g 100 mL/hr over 30 Minutes Intravenous Every 24 hours 02/01/15 1736 02/02/15 1023   02/02/15 1030  cefTRIAXone (ROCEPHIN) 2 g in dextrose 5 % 50 mL IVPB - Premix     2 g 100 mL/hr over 30 Minutes Intravenous Every 24 hours 02/02/15 1023        Medications: Scheduled Meds: . amLODipine  5 mg Per Tube Daily  . antiseptic oral rinse  7 mL Mouth Rinse BID  . atorvastatin  40 mg Per Tube q1800  . bisoprolol  10 mg Per Tube QHS  . budesonide  0.25 mg Nebulization BID  . carbidopa-levodopa  2 tablet Per Tube TID  . cefTRIAXone (ROCEPHIN)  IV  2 g Intravenous Q24H  . enoxaparin (LOVENOX) injection  80 mg Subcutaneous Q12H  . feeding supplement (JEVITY 1.5 CAL/FIBER)  355 mL Per Tube QID  . fentaNYL  50 mcg Transdermal Q72H  . free water  125 mL Per Tube QID  . insulin aspart  0-20 Units Subcutaneous TID WC  . insulin aspart  0-5 Units Subcutaneous QHS  . isosorbide dinitrate  15 mg Per Tube BID  . lamoTRIgine  25 mg Per Tube BID  . [START ON 02/08/2015] levothyroxine  137 mcg Per Tube QAC breakfast  . [START ON 02/08/2015] losartan  100 mg Per Tube Daily  . mometasone-formoterol  2 puff Inhalation BID  . pantoprazole sodium  40 mg Per Tube Daily  . [START ON 02/08/2015] PARoxetine  60 mg Per Tube Daily  . predniSONE  20 mg Per Tube Q breakfast  . sodium chloride  3 mL Intravenous Q12H  . tamsulosin  0.4 mg Oral Daily  . tiotropium  18 mcg Inhalation Daily  . Warfarin - Pharmacist Dosing Inpatient   Does not apply q1800   Continuous Infusions: . sodium chloride 0.9 % 1,000 mL infusion 75 mL/hr at 02/07/15 1404   PRN Meds:.acetaminophen **OR** acetaminophen, alum & mag hydroxide-simeth, bisacodyl,  ipratropium-albuterol, LORazepam, morphine injection, ondansetron **OR** ondansetron (ZOFRAN) IV, tiZANidine    Objective: Weight change: -3 lb 12 oz (-1.7 kg)  Intake/Output Summary (Last 24 hours) at 02/07/15 1738 Last data filed at 02/07/15 1123  Gross per 24 hour  Intake 1927.7 ml  Output    500 ml  Net 1427.7 ml   Blood pressure 140/76, pulse 74, temperature 98.1 F (36.7 C), temperature source Oral, resp. rate 20, height 5' 8.5" (1.74 m), weight 181 lb 3.5 oz (82.2 kg), SpO2 96 %. Temp:  [97.9 F (36.6 C)-98.1 F (36.7 C)] 98.1 F (36.7 C) (08/03 1400) Pulse Rate:  [65-74] 74 (08/03 1400) Resp:  [16-20] 20 (08/03 1400) BP: (140-190)/(76-93) 140/76 mmHg (08/03 1400) SpO2:  [96 %-100 %] 96 % (08/03 1400) Weight:  [181 lb 3.5 oz (82.2 kg)] 181 lb 3.5 oz (82.2 kg) (08/03 0532)  Physical Exam: General: Alert and awake, oriented x3, not in any acute distress. HEENT: anicteric sclera, pupils reactive to light and accommodation, EOMI CVS regular rate, normal r,  no murmur rubs or gallops Chest: clear to auscultation bilaterally, no wheezing, rales or rhonchi Abdomen: soft nontender, nondistended, normal bowel sounds, Extremities:  no  clubbing or edema noted bilaterally Skin: no rashes Lymph: no new lymphadenopathy Neuro: nonfocal  CBC: @LABBLAST3 (wbc3,Hgb:3,Hct:3,Plt:3,INR:3APTT:3)@   BMET  Recent Labs  02/06/15 0600 02/07/15 0542  NA 138 137  K 4.1 4.2  CL 97* 98*  CO2 32 31  GLUCOSE 94 89  BUN 16 16  CREATININE 0.69 0.81  CALCIUM 8.8* 8.8*     Liver Panel  No results for input(s): PROT, ALBUMIN, AST, ALT, ALKPHOS, BILITOT, BILIDIR, IBILI in the last 72 hours.     Sedimentation Rate No results for input(s): ESRSEDRATE in the last 72 hours. C-Reactive Protein No results for input(s): CRP in the last 72 hours.  Micro Results: Recent Results (from the past 720 hour(s))  Blood Culture (routine x 2)     Status: None   Collection Time: 01/26/15  7:46  PM  Result Value Ref Range Status   Specimen Description BLOOD LEFT ASSIST CONTROL  Final   Special Requests BOTTLES DRAWN AEROBIC AND ANAEROBIC 5CC  Final   Culture  Setup Time   Final    GRAM POSITIVE COCCI IN CHAINS IN BOTH AEROBIC AND ANAEROBIC BOTTLES CRITICAL RESULT CALLED TO, READ BACK BY AND VERIFIED WITH: MYRA FLOWERS,RN AT 0800 01/27/2015 BY JRS    Culture   Final    STREPTOCOCCUS AGALACTIAE Virtually 100% of S. agalactiae (Group B) strains are susceptible to Penicillin.  For Penicillin-allergic patients, Erythromycin (85-95% sensitive) and Clindamycin (80% sensitive) are drugs of choice. Contact microbiology lab to request sensitivities if  needed within 7 days.    Report Status 01/29/2015 FINAL  Final   Organism ID, Bacteria STREPTOCOCCUS AGALACTIAE  Final      Susceptibility   Streptococcus agalactiae - MIC*    VANCOMYCIN <=0.5 SENSITIVE Sensitive     CLINDAMYCIN <=0.25 RESISTANT Resistant     * STREPTOCOCCUS AGALACTIAE  Blood Culture (routine x 2)     Status: None   Collection Time: 01/26/15  7:46 PM  Result Value Ref Range Status   Specimen Description BLOOD LEFT HAND  Final   Special Requests BOTTLES DRAWN AEROBIC AND ANAEROBIC 5CC  Final   Culture  Setup Time   Final    GRAM POSITIVE COCCI IN CHAINS IN BOTH AEROBIC AND ANAEROBIC BOTTLES CRITICAL VALUE NOTED.  VALUE IS CONSISTENT WITH PREVIOUSLY REPORTED AND CALLED VALUE.    Culture   Final    STREPTOCOCCUS AGALACTIAE Virtually 100% of S. agalactiae (Group B) strains are susceptible to Penicillin.  For Penicillin-allergic patients, Erythromycin (85-95% sensitive) and Clindamycin (80% sensitive) are drugs of choice. Contact microbiology lab to request sensitivities if  needed within 7 days.    Report Status 01/29/2015 FINAL  Final   Organism ID, Bacteria STREPTOCOCCUS AGALACTIAE  Final      Susceptibility   Streptococcus agalactiae - MIC*    VANCOMYCIN <=0.5 SENSITIVE Sensitive     CLINDAMYCIN <=0.25 RESISTANT  Resistant     * STREPTOCOCCUS AGALACTIAE  MRSA PCR Screening     Status: None   Collection Time: 01/26/15 10:00 PM  Result Value Ref Range Status   MRSA by PCR NEGATIVE NEGATIVE Final    Comment:        The GeneXpert MRSA Assay (FDA approved for NASAL specimens only), is one component of a comprehensive MRSA colonization surveillance program. It is not intended to diagnose MRSA infection nor to guide or monitor treatment for MRSA infections.   Urine culture     Status: None   Collection Time: 01/27/15  4:39 AM  Result Value Ref Range Status   Specimen Description URINE, RANDOM  Final   Special Requests NONE  Final   Culture NO GROWTH 1 DAY  Final   Report Status 01/28/2015 FINAL  Final  Culture, expectorated sputum-assessment     Status: None   Collection Time: 01/28/15  3:05 PM  Result Value Ref Range Status   Specimen Description SPUTUM  Final   Special Requests NONE  Final   Sputum evaluation THIS SPECIMEN IS ACCEPTABLE FOR SPUTUM CULTURE  Final   Report Status 01/28/2015 FINAL  Final  Culture, respiratory (NON-Expectorated)     Status: None   Collection Time: 01/28/15  3:05 PM  Result Value Ref Range Status   Specimen Description SPUTUM  Final   Special Requests NONE Reflexed from [place]  Final   Gram Stain   Final    EXCELLENT SPECIMEN - 90-100% WBCS MANY WBC SEEN MODERATE YEAST FEW GRAM NEGATIVE RODS    Culture MODERATE GROWTH CANDIDA SPECIES, NOT ALBICANS  Final   Report Status 01/31/2015 FINAL  Final  Culture, blood (routine x 2)     Status: None   Collection Time: 02/01/15  5:46 PM  Result Value Ref Range Status   Specimen Description BLOOD RIGHT ANTECUBITAL  Final   Special Requests BOTTLES DRAWN AEROBIC AND ANAEROBIC 10CC EACH  Final   Culture NO GROWTH 5 DAYS  Final   Report Status 02/06/2015 FINAL  Final  Culture, blood (routine x 2)     Status: None   Collection Time: 02/01/15  5:55 PM  Result Value Ref Range Status   Specimen Description BLOOD LEFT  ANTECUBITAL  Final   Special Requests BOTTLES DRAWN AEROBIC AND ANAEROBIC 10CC EACH  Final   Culture NO GROWTH 5 DAYS  Final   Report Status 02/06/2015 FINAL  Final    Studies/Results: Dg Abd 1 View  02/06/2015   CLINICAL DATA:  Constipation.  Evaluate for PEG tube placement.  EXAM: ABDOMEN - 1 VIEW  COMPARISON:  02/05/2015  FINDINGS: There is contrast in nondilated loops of colon. Significant stool burden noted. No evidence for large or small bowel obstruction. Degenerative changes seen in the lower thoracic and lumbar spine.  IMPRESSION: Significant retained contrast in the colon. Significant stool burden.   Electronically Signed   By: Nolon Nations M.D.   On: 02/06/2015 11:35   Ir Gastrostomy Tube Mod Sed  02/06/2015   CLINICAL DATA:  Dysphagia, head neck cancer, status post radiation, failed swallowing exam  EXAM: FLUOROSCOPIC 20 FRENCH PULL-THROUGH GASTROSTOMY  Date:  8/2/20168/08/2014 10:47 am  Radiologist:  M. Daryll Brod, MD  Guidance:  Fluoroscopic  FLUOROSCOPY TIME:  1 minutes 54 seconds, 60 mGy  MEDICATIONS AND MEDICAL HISTORY: Patient is already receiving Rocephin IV daily.1 mg Versed, 50 mcg fentanyl  ANESTHESIA/SEDATION: 7 minutes  CONTRAST:  65mL OMNIPAQUE IOHEXOL 300 MG/ML  SOLN  COMPLICATIONS: None immediate  PROCEDURE: Informed consent was obtained from the patient following explanation of the procedure, risks, benefits and alternatives. The patient understands, agrees and consents for the procedure. All questions were addressed. A time out was performed.  Maximal barrier sterile technique utilized including caps, mask, sterile gowns, sterile gloves, large sterile drape, hand hygiene, and betadine prep.  The left upper quadrant was sterilely prepped and draped. An oral gastric catheter was inserted into the stomach under fluoroscopy. The existing nasogastric feeding tube was removed. Air was injected into the stomach for insufflation and visualization under fluoroscopy. The air  distended stomach was confirmed  beneath the anterior abdominal wall in the frontal and lateral projections. Under sterile conditions and local anesthesia, a 36 gauge trocar needle was utilized to access the stomach percutaneously beneath the left subcostal margin. Needle position was confirmed within the stomach under biplane fluoroscopy. Contrast injection confirmed position also. A single T tack was deployed for gastropexy. Over an Amplatz guide wire, a 9-French sheath was inserted into the stomach. A snare device was utilized to capture the oral gastric catheter. The snare device was pulled retrograde from the stomach up the esophagus and out the oropharynx. The 20-French pull-through gastrostomy was connected to the snare device and pulled antegrade through the oropharynx down the esophagus into the stomach and then through the percutaneous tract external to the patient. The gastrostomy was assembled externally. Contrast injection confirms position in the stomach. Images were obtained for documentation. The patient tolerated procedure well. No immediate complication.  IMPRESSION: Fluoroscopic insertion of a 20-French "pull-through" gastrostomy.   Electronically Signed   By: Jerilynn Mages.  Shick M.D.   On: 02/06/2015 13:08      Assessment/Plan:  Principal Problem:   Discitis of lumbar region Active Problems:   Multiple pulmonary nodules   Aspiration pneumonitis   Hypertension   Barrett's esophagus   CAD (coronary artery disease)   COPD (chronic obstructive pulmonary disease)   History of DVT (deep vein thrombosis)   Acute on chronic respiratory failure with hypoxia   Bacteremia due to group B Streptococcus   Antineutrophil cytoplasmic antibody (ANCA) positive   Goodpasture's disease   Epidural abscess   Warfarin anticoagulation   Parkinsons disease   Vertebral osteomyelitis   Dysphagia/chronic   Diskitis   Dysphagia causing pulmonary aspiration with swallowing   Constipation    John Mendoza  is a 69 y.o. male with  Goodpasture's on chronic immunosuppressive therapy found to have GBS bacteremia and diskitis  #1 GBS bacteremia and diskitis with an epidural abscess:  --2 d echocardiogram read is pending though will not effect duration and he would NOT be a CVTS candidate (more than anything I want this for prognostic reasons in case he has problems down the road)  --would plan on 8 weeks of IV rocephin 2 grams daily  --he SHOULD HAVE A FOLLOWUP MRI OF HIS L SPINE IN NEXT 6 WEEKS PRIOR TO STOPPING THERAPY TO ENSURE THAT THE EPIDURAL ABSCESS IS RESOLVED  --I would consider further protracted oral abx such as keflex after finishing his IV abx esp if the epidural abscess has not resolved.   --Given that he lives in [place] he can actually followup with Dr Ola Spurr at [facility] where he has PCP as well   Make sure that he has FU appt with Dr. Ola Spurr in next 3-4 weeks, if not he can followup with [place]  I will sign off for now please call with further questions.    LOS: 6 days   Alcide Evener 02/07/2015, 5:38 PM

## 2015-02-07 NOTE — Progress Notes (Signed)
OT Cancellation Note  Patient Details Name: MOKSH LOOMER MRN: [id number] DOB: [birth year]   Cancelled Treatment:    Reason Eval/Treat Not Completed: Patient at procedure or test/ unavailable  Benito Mccreedy OTR/L [phone] 02/07/2015, 3:04 PM

## 2015-02-07 NOTE — Progress Notes (Signed)
Occupational Therapy Treatment Patient Details Name: John Mendoza MRN: [id number] DOB: 23-Oct-1945 Today's Date: 02/07/2015    History of present illness Pt is a 69 y.o. M admitted to [facility] on 7/22 2/2 sepsis from aspiration pneumonitis and later found to have B strep bacteremia.  MRI revealed L4-5 discitis and vertebral osteomyelitis w/ epidural abscess.  Pt's PMH includes Barrett esophagus, HTN, CHF, lumbar spinal stenosis, COPD, CAD, DVT, Parkinson's, DMII, anemia, seizures, chronic LBP, depression, gout, anxiety, anterior cervical decom/discectomy fusion, R TKA.   OT comments  Pt moving well. Recommending HHOT upon d/c.    Follow Up Recommendations  Supervision - Intermittent;Home health OT    Equipment Recommendations  None recommended by OT    Recommendations for Other Services      Precautions / Restrictions Precautions Precautions: Fall Restrictions Weight Bearing Restrictions: No       Mobility Bed Mobility Overal bed mobility:Modified Independent            General bed mobility comments: suggested log rolling, but performed more of supine to sit technique.   Transfers Overall transfer level: Needs assistance Equipment used: None Transfers: Sit to/from Stand Sit to Stand: Supervision              Balance        Supervision for ambulation.                           ADL Overall ADL's : Needs assistance/impaired     Grooming: Wash/dry face;Set up;Supervision/safety;Standing   Upper Body Bathing: Supervision/ safety;Set up;Standing Upper Body Bathing Details (indicate cue type and reason): washed armpits Lower Body Bathing: Set up;Supervison/ safety;Sit to/from stand Lower Body Bathing Details (indicate cue type and reason): washed bottom and peri area Upper Body Dressing :  (OT assisted with gown)       Toilet Transfer: Supervision/safety;Ambulation (sit to stand from bed)           Functional mobility during ADLs:  Supervision/safety General ADL Comments: Discussed safety concern with pt going home and trying to care for his wife, as his wife was recently hospitalized since OT evaluated pt. Pt will now have PICC line and tube feedings to manage at home-OT spoke with nurse and [place] lady. Cues for deep breathing as O2 dropped when pt was taken off oxygen.       Vision                     Perception     Praxis      Cognition  Awake/Alert Behavior During Therapy: WFL for tasks assessed/performed Overall Cognitive Status: Within Functional Limits for tasks assessed                       Extremity/Trunk Assessment               Exercises     Shoulder Instructions       General Comments      Pertinent Vitals/ Pain       Pain Assessment: 0-10 Pain Score:  (5-6) Pain Location: back Pain Descriptors / Indicators: Aching;Sore Pain Intervention(s): Monitored during session   O2 dropping to (high)70s-80s when O2 was taken off. Placed pt back on 2L of O2 and O2 trended up to 90s.  Home Living  Prior Functioning/Environment              Frequency Min 2X/week     Progress Toward Goals  OT Goals(current goals can now be found in the care plan section)  Progress towards OT goals: Progressing toward goals  Acute Rehab OT Goals Patient Stated Goal: pt wants to go home OT Goal Formulation: With patient/family Time For Goal Achievement: 02/11/15 Potential to Achieve Goals: Good ADL Goals Pt Will Perform Lower Body Bathing: with modified independence;sit to/from stand Pt Will Perform Lower Body Dressing: with modified independence;sit to/from stand Pt Will Transfer to Toilet: with modified independence;ambulating Additional ADL Goal #1: Pt will independently perform HEP for bilateral UEs to increase strength.  Plan Discharge plan needs to be updated    Co-evaluation                  End of Session Equipment Utilized During Treatment: Gait belt;Oxygen; abdominal binder   Activity Tolerance Patient tolerated treatment well   Patient Left in bed;with call bell/phone within reach    Nurse Communication Mobility status;Other (comment) (d/c plan)        Time: [phone] (some time spent with pt talking to case manager/[facility] lady) OT Time Calculation (min): 22 min  Charges: OT General Charges $OT Visit: 1 Procedure OT Treatments $Self Care/Home Management : 8-22 mins  Benito Mccreedy OTR/L [phone] 02/07/2015, 5:36 PM

## 2015-02-07 NOTE — Progress Notes (Addendum)
[facility] PROGRESS NOTE  John Mendoza XAJ:[id number] DOB: 1946-03-22 DOA: 02/01/2015 PCP: Adin Hector, MD  Brief history of present illness 69 year old Caucasian male sent over from [facility] due to acute discitis and epidural abscess at L4-5. Also had group B streptococcal bacteremia. ID was consulted. Will need ceftriaxone for 8 weeks total. Also noted to have significant dysphagia. PEG tube has been placed. A [name] is pending. Echocardiogram is pending.  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 John Mendoza's pneumonia is improving. John Mendoza has been seen in consultation by ID who recommended holding aspiration of the abscess because source of infection likely consistent with group B streptococcus bacteremia. Repeat blood cultures were obtained and are negative so far. Continue IV Rocephin for at least 8 weeks per ID recommendations. Dose of Duragesic patch was increased to 50 MCG's every 3 days for pain management. IV morphine for when necessary breakthrough pain. John Mendoza needs to follow-up with ID as outpatient. He has been seen by Dr. Ola Spurr at [place]. John Mendoza will need PICC line. This has been ordered and will likely be placed today. Echocardiogram was ordered by infectious diseases and is still pending.  #2 GpB  streptococcus bacteremia Continue IV Rocephin.  #3 Pneumonia John Mendoza with recent pneumonia approximately week prior to admission. Medical improvement. On IV Rocephin. Continue oxygen, nebulizers.  #4 COPD Stable. Continue Spiriva and dulera.  #5 hypertension Blood pressure is poorly controlled. Resume his home medications now that PEG tube has been placed.   #6 coronary artery disease Stable. Resume his home medications.   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 However due to John Mendoza's recent diagnosis of Parkinson's will need to keep a close eye on John Mendoza's balance and carefully evaluate his fall risk. Patients INR was reversed in anticipation of possible interventional radiological procedure which per ID note is not needed at this time. Continue full dose Lovenox due to dysphagia John Mendoza unable to take oral intake. PEG tube has been placed. Warfarin will be resumed today. Continue Lovenox till INR is therapeutic. This can be managed at home as well.  #8 Parkinson's Disease Continue Sinemet.   #9 hypothyroidism Resume Synthroid via feeding tube.   #10 history of multiple pulmonary nodules Follow up in the outpatient setting by pulmonary.  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 Speech therapy recommending nothing by mouth status. John Mendoza has been seen by interventional radiology and John Mendoza s/p PEG tube 8/2. Start bolus feeding as recommended by nutritionist.   #13 constipation Status post enema with good bowel movement per John Mendoza.  #14 prophylaxis Lovenox for DVT prophylaxis.  Code Status: Full Family Communication: Updated John Mendoza at bedside. No family present. Disposition Plan: John Mendoza's wife is  currently hospitalized in [place]. We  will change his medicines to be given via PEG tube today. Initiate feeding today. PICC line to be placed. Echocardiogram is also pending. Hopefully all of this will occur today. He thinks that his wife will be discharged tomorrow. If so, John Mendoza could also go home at the same time.   Consultants:  Infectious diseases: Dr. Linus Salmons 02/02/2015  IR  Procedures:  MRI lumbar spine 02/01/2015  MBS  PEG tube 02/06/2015   Antibiotics:  IV Rocephin 02/02/2015  Subjective: John Mendoza feels well. Denies any complaints. No pain.   Objective: Filed Vitals:   02/07/15 0600  BP: 176/83  Pulse:   Temp:   Resp: 18    Intake/Output Summary (Last 24 hours) at 02/07/15 0938 Last data filed at 02/06/15 1915  Gross per 24 hour  Intake 1927.7 ml  Output    950 ml  Net  977.7 ml   Filed Weights   02/05/15 0518 02/06/15 0534 02/07/15 0532  Weight: 83.5 kg (184 lb 1.4 oz) 83.9 kg (184 lb 15.5 oz) 82.2 kg (181 lb 3.5 oz)    Exam:   General:  NAD  Cardiovascular: S1, S2 is normal, regular. No S3, S4.  Respiratory: Clear to auscultation bilaterally  Abdomen: Soft, nontender, nondistended, positive bowel sounds. PEG tube is noted.   Data Reviewed: Basic Metabolic Panel:  Recent Labs Lab 02/03/15 0510 02/04/15 0538 02/04/15 0900 02/05/15 0547 02/06/15 0600 02/07/15 0542  NA 136 136  --  136 138 137  K 4.2 5.6* 4.4 4.4 4.1 4.2  CL 93* 98*  --  102 97* 98*  CO2 37* 30  --  27 32 31  GLUCOSE 109* 129*  --  130* 94 89  BUN 14 21*  --  22* 16 16  CREATININE 0.77 0.77  --  0.72 0.69 0.81  CALCIUM 9.2 9.1  --  8.9 8.8* 8.8*  MG 2.0  --   --   --  1.8  --    Liver Function Tests:  Recent Labs Lab 02/01/15 1737  AST 18  ALT 15*  ALKPHOS 60  BILITOT 0.6  PROT 6.2*  ALBUMIN 2.5*   CBC:  Recent Labs Lab 02/01/15 1737  02/03/15 0510 02/04/15 0538 02/05/15 0547 02/06/15 0600 02/07/15 0542  WBC 8.6  < > 8.3 8.2 9.0 8.3 10.1   NEUTROABS 8.1*  --   --   --   --   --   --   HGB 9.4*  < > 9.6* 11.8* 10.6* 10.3* 11.5*  HCT 30.3*  < > 31.6* 36.0* 34.6* 34.2* 35.9*  MCV 76.1*  < > 76.9* 75.8* 77.1* 76.7* 76.1*  PLT 261  < > 280 PLATELET CLUMPS NOTED ON SMEAR, COUNT APPEARS ADEQUATE 306 293 260  < > = values in this interval not displayed.   CBG:  Recent Labs Lab 02/06/15 0818 02/06/15 1146 02/06/15 1657 02/06/15 2237 02/07/15 0750  GLUCAP 94 135* 96 135* 80    Recent Results (from the past 240 hour(s))  Culture, expectorated sputum-assessment     Status: None   Collection Time: 01/28/15  3:05 PM  Result Value Ref Range Status   Specimen Description SPUTUM  Final   Special Requests NONE  Final   Sputum evaluation THIS SPECIMEN IS ACCEPTABLE FOR SPUTUM CULTURE  Final   Report Status 01/28/2015 FINAL  Final  Culture, respiratory (NON-Expectorated)     Status: None   Collection Time: 01/28/15  3:05 PM  Result Value Ref Range Status   Specimen Description  SPUTUM  Final   Special Requests NONE Reflexed from [place]  Final   Gram Stain   Final    EXCELLENT SPECIMEN - 90-100% WBCS MANY WBC SEEN MODERATE YEAST FEW GRAM NEGATIVE RODS    Culture MODERATE GROWTH CANDIDA SPECIES, NOT ALBICANS  Final   Report Status 01/31/2015 FINAL  Final  Culture, blood (routine x 2)     Status: None   Collection Time: 02/01/15  5:46 PM  Result Value Ref Range Status   Specimen Description BLOOD RIGHT ANTECUBITAL  Final   Special Requests BOTTLES DRAWN AEROBIC AND ANAEROBIC 10CC EACH  Final   Culture NO GROWTH 5 DAYS  Final   Report Status 02/06/2015 FINAL  Final  Culture, blood (routine x 2)     Status: None   Collection Time: 02/01/15  5:55 PM  Result Value Ref Range Status   Specimen Description BLOOD LEFT ANTECUBITAL  Final   Special Requests BOTTLES DRAWN AEROBIC AND ANAEROBIC 10CC EACH  Final   Culture NO GROWTH 5 DAYS  Final   Report Status 02/06/2015 FINAL  Final     Studies: Dg Abd 1 View  02/06/2015    CLINICAL DATA:  Constipation.  Evaluate for PEG tube placement.  EXAM: ABDOMEN - 1 VIEW  COMPARISON:  02/05/2015  FINDINGS: There is contrast in nondilated loops of colon. Significant stool burden noted. No evidence for large or small bowel obstruction. Degenerative changes seen in the lower thoracic and lumbar spine.  IMPRESSION: Significant retained contrast in the colon. Significant stool burden.   Electronically Signed   By: Nolon Nations M.D.   On: 02/06/2015 11:35   Ir Gastrostomy Tube Mod Sed  02/06/2015   CLINICAL DATA:  Dysphagia, head neck cancer, status post radiation, failed swallowing exam  EXAM: FLUOROSCOPIC 20 FRENCH PULL-THROUGH GASTROSTOMY  Date:  8/2/20168/08/2014 10:47 am  Radiologist:  M. Daryll Brod, MD  Guidance:  Fluoroscopic  FLUOROSCOPY TIME:  1 minutes 54 seconds, 60 mGy  MEDICATIONS AND MEDICAL HISTORY: John Mendoza is already receiving Rocephin IV daily.1 mg Versed, 50 mcg fentanyl  ANESTHESIA/SEDATION: 7 minutes  CONTRAST:  79mL OMNIPAQUE IOHEXOL 300 MG/ML  SOLN  COMPLICATIONS: None immediate  PROCEDURE: Informed consent was obtained from the John Mendoza following explanation of the procedure, risks, benefits and alternatives. The John Mendoza understands, agrees and consents for the procedure. All questions were addressed. A time out was performed.  Maximal barrier sterile technique utilized including caps, mask, sterile gowns, sterile gloves, large sterile drape, hand hygiene, and betadine prep.  The left upper quadrant was sterilely prepped and draped. An oral gastric catheter was inserted into the stomach under fluoroscopy. The existing nasogastric feeding tube was removed. Air was injected into the stomach for insufflation and visualization under fluoroscopy. The air distended stomach was confirmed beneath the anterior abdominal wall in the frontal and lateral projections. Under sterile conditions and local anesthesia, a 107 gauge trocar needle was utilized to access the stomach  percutaneously beneath the left subcostal margin. Needle position was confirmed within the stomach under biplane fluoroscopy. Contrast injection confirmed position also. A single T tack was deployed for gastropexy. Over an Amplatz guide wire, a 9-French sheath was inserted into the stomach. A snare device was utilized to capture the oral gastric catheter. The snare device was pulled retrograde from the stomach up the esophagus and out the oropharynx. The 20-French pull-through gastrostomy was connected to the snare device and pulled antegrade through the oropharynx down the esophagus into the stomach and then through the  percutaneous tract external to the John Mendoza. The gastrostomy was assembled externally. Contrast injection confirms position in the stomach. Images were obtained for documentation. The John Mendoza tolerated procedure well. No immediate complication.  IMPRESSION: Fluoroscopic insertion of a 20-French "pull-through" gastrostomy.   Electronically Signed   By: Jerilynn Mages.  Shick M.D.   On: 02/06/2015 13:08   Dg Abd 2 Views  02/05/2015   CLINICAL DATA:  Ileus, planned PEG tube placement  EXAM: ABDOMEN - 2 VIEW  COMPARISON:  None  FINDINGS: Significant stool in transverse and proximal descending colon.  Retained contrast in RIGHT colon through hepatic flexure to proximal transverse colon.  Prominent small bowel loops in the mid abdomen could reflect ileus.  Small amount of stool in rectum.  No evidence of bowel obstruction or bowel wall thickening.  Bones demineralized.  Emphysematous changes with atelectasis and potential scarring at lung bases.  IMPRESSION: Stool in transverse through proximal descending colon and contrast in the RIGHT colon to proximal transverse colon.  Air-filled loops of small bowel in the mid abdomen, overall nonobstructive pattern, question ileus.   Electronically Signed   By: Lavonia Dana M.D.   On: 02/05/2015 09:44    Scheduled Meds: . amLODipine  5 mg Oral Daily  . antiseptic oral rinse   7 mL Mouth Rinse BID  . atorvastatin  40 mg Oral q1800  . bisoprolol  10 mg Oral QHS  . budesonide  0.25 mg Nebulization BID  . carbidopa-levodopa  2 tablet Oral TID  . cefTRIAXone (ROCEPHIN)  IV  2 g Intravenous Q24H  . enoxaparin (LOVENOX) injection  80 mg Subcutaneous Q12H  . feeding supplement (JEVITY 1.5 CAL/FIBER)  355 mL Per Tube QID  . fentaNYL  50 mcg Transdermal Q72H  . free water  125 mL Per Tube QID  . insulin aspart  0-20 Units Subcutaneous TID WC  . insulin aspart  0-5 Units Subcutaneous QHS  . isosorbide mononitrate  30 mg Oral Daily  . lamoTRIgine  25 mg Oral BID  . [START ON 02/08/2015] levothyroxine  137 mcg Oral QAC breakfast  . losartan  100 mg Oral Daily  . mometasone-formoterol  2 puff Inhalation BID  . pantoprazole sodium  40 mg Per Tube Daily  . PARoxetine  60 mg Oral Daily  . predniSONE  20 mg Oral Q breakfast  . sodium chloride  3 mL Intravenous Q12H  . tamsulosin  0.4 mg Oral Daily  . tiotropium  18 mcg Inhalation Daily   Continuous Infusions: . sodium chloride 0.9 % 1,000 mL infusion 75 mL/hr at 02/07/15 2694    Principal Problem:   Discitis of lumbar region Active Problems:   Multiple pulmonary nodules   Aspiration pneumonitis   Hypertension   Barrett's esophagus   CAD (coronary artery disease)   COPD (chronic obstructive pulmonary disease)   History of DVT (deep vein thrombosis)   Acute on chronic respiratory failure with hypoxia   Bacteremia due to group B Streptococcus   Antineutrophil cytoplasmic antibody (ANCA) positive   Goodpasture's disease   Epidural abscess   Warfarin anticoagulation   Parkinsons disease   Vertebral osteomyelitis   Dysphagia/chronic   Diskitis   Dysphagia causing pulmonary aspiration with swallowing   Constipation    Time spent: 30 mins    Javarus Dorner MD [facility] Pager [phone].   If 7PM-7AM, please contact night-coverage at [email or web address], password [email or web address] 02/07/2015, 9:38 AM  LOS: 6 days

## 2015-02-07 NOTE — Progress Notes (Signed)
*  PRELIMINARY RESULTS* Echocardiogram 2D Echocardiogram has been performed.  John Mendoza 02/07/2015, 3:42 PM

## 2015-02-07 NOTE — Progress Notes (Signed)
[facility]  Met with pt to discuss goals and plan of care for home.  Pt verbalized concern that "maybe I didn't make the right decision".  Pt wife, who was caregiver prior to his admission to the hospital, is also an inpatient in local hospital.  Discussed with pt PC needs for daily administration of home IV ABX x 6-8 weeks;  PEG tube feedings and safe mobility concerns related to deconditioning due to extensive hospitalization. Advised pt he would need a caregiver to support daily IV ABX as well as QID PEG feedings.  Pt struggling to identify a family member or friend that could consistently support any care at home. [place] team will contact patient's daughter with his permission to discuss her ability or other family to support pt at home. Discussed with Ginger, RN on unit for pt today.  Ginger will support additional PEG tube feeding education with next feeding today.  [facility] Infusion Coordinator will provide IV ABX hands on training with pt as well.   John Mendoza 02/07/2015, 4:39 PM

## 2015-02-08 DIAGNOSIS — R7881 Bacteremia: Secondary | ICD-10-CM

## 2015-02-08 DIAGNOSIS — J690 Pneumonitis due to inhalation of food and vomit: Secondary | ICD-10-CM

## 2015-02-08 DIAGNOSIS — J9621 Acute and chronic respiratory failure with hypoxia: Secondary | ICD-10-CM

## 2015-02-08 LAB — BASIC METABOLIC PANEL
Anion gap: 6 (ref 5–15)
BUN: 22 mg/dL — ABNORMAL HIGH (ref 6–20)
CALCIUM: 8.6 mg/dL — AB (ref 8.9–10.3)
CHLORIDE: 97 mmol/L — AB (ref 101–111)
CO2: 35 mmol/L — ABNORMAL HIGH (ref 22–32)
CREATININE: 0.82 mg/dL (ref 0.61–1.24)
GFR calc Af Amer: >60 mL/min (ref >60)
GFR calc non Af Amer: >60 mL/min (ref >60)
GLUCOSE: 234 mg/dL — AB (ref 65–99)
Potassium: 4.2 mmol/L (ref 3.5–5.1)
Sodium: 138 mmol/L (ref 135–145)

## 2015-02-08 LAB — PROTIME-INR
INR: 1.16 (ref 0.00–1.49)
Prothrombin Time: 15 seconds (ref 11.6–15.2)

## 2015-02-08 LAB — GLUCOSE, CAPILLARY
GLUCOSE-CAPILLARY: 155 mg/dL — AB (ref 65–99)
Glucose-Capillary: 207 mg/dL — ABNORMAL HIGH (ref 65–99)

## 2015-02-08 MED ORDER — CEFTRIAXONE SODIUM IN DEXTROSE 40 MG/ML IV SOLN: 2.0000 g | INTRAVENOUS | Status: DC

## 2015-02-08 MED ORDER — SODIUM CHLORIDE 0.9 % IJ SOLN
10.0000 mL | INTRAMUSCULAR | Status: DC | PRN
  Administered 2015-02-08: 10 mL
  Filled 2015-02-08: qty 40

## 2015-02-08 MED ORDER — HEPARIN SOD (PORK) LOCK FLUSH 100 UNIT/ML IV SOLN
250.0000 [IU] | INTRAVENOUS | Status: DC | PRN
  Administered 2015-02-08: 250 [IU]
  Filled 2015-02-08 (×2): qty 3

## 2015-02-08 MED ORDER — FENTANYL 50 MCG/HR TD PT72: 50.0000 ug | MEDICATED_PATCH | TRANSDERMAL | Status: DC

## 2015-02-08 MED ORDER — HEPARIN SOD (PORK) LOCK FLUSH 100 UNIT/ML IV SOLN
250.0000 [IU] | Freq: Every day | INTRAVENOUS | Status: DC
  Filled 2015-02-08: qty 3

## 2015-02-08 MED ORDER — JEVITY 1.5 CAL/FIBER PO LIQD: 355.0000 mL | Freq: Four times a day (QID) | ORAL | Status: DC

## 2015-02-08 MED ORDER — ISOSORBIDE DINITRATE 5 MG PO TABS: 15.0000 mg | ORAL_TABLET | Freq: Two times a day (BID) | ORAL | Status: DC

## 2015-02-08 MED ORDER — ENOXAPARIN SODIUM 80 MG/0.8ML ~~LOC~~ SOLN: 80.0000 mg | Freq: Two times a day (BID) | SUBCUTANEOUS | Status: DC

## 2015-02-08 MED ORDER — FREE WATER
125.0000 mL | Freq: Four times a day (QID) | Status: DC
Start: 2015-02-08 — End: 2015-12-01

## 2015-02-08 NOTE — Care Management Important Message (Signed)
Important Message  Patient Details  Name: John Mendoza MRN: [id number] Date of Birth: 1946/01/19   Medicare Important Message Given:  Yes-third notification given    Nathen May 02/08/2015, 12:08 [place] Message  Patient Details  Name: John Mendoza MRN: [id number] Date of Birth: [birth year]   Medicare Important Message Given:  Yes-third notification given    Nathen May 02/08/2015, 12:08 PM

## 2015-02-08 NOTE — Care Management Note (Signed)
Case Management Note  Patient Details  Name: John Mendoza MRN: [id number] Date of Birth: 1945/12/22  Subjective/Objective:                 Patient from home with wife who was discharged from [facility] today as well. Patient to go to home ar discharge with [facility] IV infusion and new tube feeding supplies from [facility]. Patient to be provided with [facility] RN PT HHA with Alvis Lemmings. Patient met with Erin Hearing at bedside to discuss plan of care. Patient agreed that he will be able to do his own tube feeding tonight and HH can begin tomorrow morning. Karolee Stamps to look in to getting services for wife as well, as patient helps wife with her medical care.    Action/Plan:  Patient to be discharged to home today with home health services. Tube feeding to be delivered to house today. IV infusions to begin tomorrow.   Expected Discharge Date:                  Expected Discharge Plan:  [place]  In-House Referral:     Discharge planning Services  CM Consult  Post Acute Care Choice:  Durable Medical Equipment, Home Health Choice offered to:  Patient  DME Arranged:  Tube feeding, IV pump/equipment DME Agency:  [place]:    [place] Agency:  [place], [place]  Status of Service:  In process, will continue to follow  Medicare Important Message Given:  Yes-third notification given Date Medicare IM Given:    Medicare IM give by:    Date Additional Medicare IM Given:    Additional Medicare Important Message give by:     If discussed at [place] of Stay Meetings, dates discussed:    Additional Comments:  Carles Collet, RN 02/08/2015, 1:47 PM

## 2015-02-08 NOTE — Discharge Summary (Signed)
Physician Discharge Summary  John Mendoza:[id number] DOB: 09-23-1945 DOA: 02/01/2015  PCP: Adin Hector, MD  Admit date: 02/01/2015 Discharge date: 02/08/2015  Recommendations for Outpatient Follow-up:  1. Pt will need to follow up with PCP in 2 weeks post discharge 2. Please obtain CBC, ESR, CRP once weekly while on antibiotics 3. Discontinue PICC line after last dose of ceftriaxone 03/30/2015 4. Continue ceftriaxone 2 g daily through 03/30/2015 5. 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 Patient also on chronic prednisone secondary to Goodpasture's.  Patient has been seen in consultation by ID who recommended holding aspiration of the abscess because source of infection likely consistent with group B streptococcus bacteremia. Repeat blood cultures were obtained and are negative so far. Continue IV Rocephin for at least 8 weeks per ID recommendations.  Stop date of ceftriaxone 03/30/2015 Dose of Duragesic patch was increased to 50 MCG's every 3 days for pain management. Patient already has prescription for Percocet at his pharmacy for breakthrough pain. Patient needs to follow-up with ID as outpatient. He has been seen by Dr. Ola Spurr at [facility]. PICC line placed 02/08/2015. Please follow up with Dr. Ola Spurr in 2-3 weeks. Echocardiogram negative for vegetations. EF 16-60%, grade 2 diastolic dysfunction.  #2 GpB streptococcus bacteremia Continue IV Rocephin. -Surveillance blood culture negative 02/01/2015  #3 Pneumonia Patient with recent pneumonia approximately week prior to admission. Medical improvement. On IV Rocephin. Continue oxygen, nebulizers. -Stable on room air at the time of discharge  #4 COPD Stable. Continue Spiriva and dulera.  #5 hypertension Blood  pressure is poorly controlled. Resume his home medications now that PEG tube has been placed.   #6 coronary artery disease Stable. Resume his home medications.  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 However due to patient's recent diagnosis of Parkinson's will need to keep a close eye on patient's balance and carefully evaluate his fall risk. Patients INR was reversed in anticipation of possible interventional radiological procedure which per ID note is not needed at this time.  -Patient was restarted on warfarin 02/07/2015 with Lovenox bridge  - patient will need Lovenox bridge until INR is therapeutic  - patient instructed to follow up with his primary care provider for INR check on 02/12/2015    #8 Parkinson's Disease Continue Sinemet.  -follow-up with neurology in [place]  #9 hypothyroidism Resume Synthroid via feeding tube.   #10 history of multiple pulmonary nodules Follow up in the outpatient setting by pulmonary.  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 Speech therapy recommending nothing by mouth status. Patient has been seen by interventional radiology and patient s/p PEG tube  8/2.  Start bolus feeding as recommended by nutritionist.  -Patient will go home with Jevity 1.5, 1.5 cans (355 mLs) twice a day with free water flushes (125 mLs) before and after each administration of tube feedings  #13 constipation Status post enema with good bowel movement per patient.  #14 prophylaxis Lovenox for DVT prophylaxis.  Discharge Condition: stable  Disposition: home Follow-up Information    Follow up with [facility].   Why:  RN, PT, [place]. Will call 24 to 48 hours after discharge to set up first home visit.   Contact information:   [address] [place] [address] [phone]       Follow up with [facility].   Why:  For supplies for tube feedings.   Contact information:   [address] [place] [address] [phone]       Diet:NPO--tube feeds only.  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 [facility] from aspiration pneumonitis with secondary group B streptococcal bacteremia. His hospitalization started 7/22 and continued until today. He was on ceftriaxone for the bacteremia and was improving. While hospitalized, he had increasing back pain, particularly over the past 24 hours prior to transferring to this facility. Orthopedics was consulted due to the increasing back pain with radiation down the legs worse with movements. An MRI with and without contrast of the lumbar spine showed L4-L5 discitis with vertebral osteomyelitis and epidural abscess.  Orthopedics and neuroradiology both felt that the patient ought to be transferred to our facility where infection disease and IR drainage could be performed. Vitamin K 5 mg was given subcutaneously at the time of admission in preparation for possible procedure. However aspiration was withheld per ID recommendations as an organism was isolated.    Consultants: ID--[facility] Mae[facility] Sarah IR  Discharge Exam: Filed Vitals:   02/08/15 0530  BP: 148/77  Pulse: 56  Temp: 98.4 F (36.9 C)  Resp: 20   Filed Vitals:   02/07/15 1400 02/07/15 1919 02/07/15 2138 02/08/15 0530  BP: 140/76  123/58 148/77  Pulse: 74 71 72 56  Temp: 98.1 F (36.7 C)  98.1 F (36.7 C) 98.4 F (36.9 C)  TempSrc: Oral  Oral   Resp: $Remo'20 16 20 20  'XnoKt$ Height:      Weight:    83.3 kg (183 lb 10.3 oz)  SpO2: 96% 96% 98% 100%   General: A&O x 3, NAD, pleasant, cooperative Cardiovascular: RRR, no rub, no gallop, no S3 Respiratory: CTAB, no wheeze, no rhonchi Abdomen:soft, nontender, nondistended, positive bowel sounds Extremities: No edema, No lymphangitis, no petechiae  Discharge Instructions      Discharge Instructions    Diet - low sodium heart healthy    Complete by:  As directed      Increase activity slowly    Complete by:  As directed             Medication List    STOP taking these medications        cefTRIAXone 1 g in dextrose 5 % 50  mL     enoxaparin 150 MG/ML injection  Commonly known as:  LOVENOX  Replaced by:  enoxaparin 80 MG/0.8ML injection     isosorbide mononitrate 30 MG 24 hr tablet  Commonly known as:  IMDUR      TAKE these medications        amLODipine 5 MG tablet  Commonly known as:  NORVASC  Take 5 mg by mouth at bedtime.     aspirin EC 81 MG tablet  Take 81 mg by mouth at bedtime.     atorvastatin 40 MG tablet  Commonly known as:  LIPITOR  Take 40 mg by mouth at bedtime.     bisacodyl 10 MG suppository  Commonly known as:  DULCOLAX  Place 1 suppository (10 mg total) rectally  daily as needed for moderate constipation.     bisoprolol 10 MG tablet  Commonly known as:  ZEBETA  Take 10 mg by mouth at bedtime.     carbidopa-levodopa 25-100 MG per tablet  Commonly known as:  SINEMET IR  Take 3 tablets by mouth 3 (three) times daily.     cefTRIAXone 40 MG/ML IVPB  Commonly known as:  ROCEPHIN  Inject 50 mLs (2 g total) into the vein daily. Stop after last dose on 03/30/15     clonazePAM 0.5 MG tablet  Commonly known as:  KLONOPIN  Take 0.5 mg by mouth every morning.     clonazePAM 1 MG tablet  Commonly known as:  KLONOPIN  Take 1 mg by mouth at bedtime.     docusate sodium 100 MG capsule  Commonly known as:  COLACE  Take 1 capsule (100 mg total) by mouth 2 (two) times daily.     DRY EYES OP  Place 1 drop into both eyes daily as needed (dry eyes).     enoxaparin 80 MG/0.8ML injection  Commonly known as:  LOVENOX  Inject 0.8 mLs (80 mg total) into the skin every 12 (twelve) hours.     feeding supplement (JEVITY 1.5 CAL/FIBER) Liqd  Place 355 mLs into feeding tube 4 (four) times daily. 39mL = 1.5 cans     fentaNYL 50 MCG/HR  Commonly known as:  DURAGESIC - dosed mcg/hr  Place 1 patch (50 mcg total) onto the skin every 3 (three) days.     free water Soln  Place 125 mLs into feeding tube 4 (four) times daily. Before AND after each tube feed administration     ICY HOT BACK EX  Apply 1 application topically daily as needed (back pain).     isosorbide dinitrate 5 MG tablet  Commonly known as:  ISORDIL  Place 3 tablets (15 mg total) into feeding tube 2 (two) times daily.     lamoTRIgine 25 MG tablet  Commonly known as:  LAMICTAL  Take 25 mg by mouth 2 (two) times daily.     levothyroxine 137 MCG tablet  Commonly known as:  SYNTHROID, LEVOTHROID  Take 137 mcg by mouth daily.     losartan 100 MG tablet  Commonly known as:  COZAAR  Take 100 mg by mouth at bedtime.     metFORMIN 500 MG tablet  Commonly known as:  GLUCOPHAGE  Take 500 mg by  mouth 2 (two) times daily.     mometasone-formoterol 200-5 MCG/ACT Aero  Commonly known as:  DULERA  Inhale 2 puffs into the lungs 2 (two) times daily.     MULTI-VITAMINS Tabs  Take 1 tablet by mouth daily. Take the same  brand of multivitamins while on warfarin (coumadin).  If multivitamin product is changed, let your provider know.  Have INR checked in 3-5 days after taking new product.     nitroGLYCERIN 0.4 MG SL tablet  Commonly known as:  NITROSTAT  Place 0.4 mg under the tongue every 5 (five) minutes x 3 doses as needed for chest pain.     omeprazole 20 MG capsule  Commonly known as:  PRILOSEC  Take 40 mg by mouth 2 (two) times daily.     oxyCODONE-acetaminophen 5-325 MG per tablet  Commonly known as:  PERCOCET/ROXICET  Take 1 tablet by mouth every 6 (six) hours as needed for severe pain.     PARoxetine 20 MG tablet  Commonly known as:  PAXIL  Take 20 mg by mouth 3 (three) times daily.     predniSONE 20 MG tablet  Commonly known as:  DELTASONE  Take 1 tablet (20 mg total) by mouth daily with breakfast.     tamsulosin 0.4 MG Caps capsule  Commonly known as:  FLOMAX  Take 0.4 mg by mouth daily.     tiotropium 18 MCG inhalation capsule  Commonly known as:  SPIRIVA  Place 1 capsule (18 mcg total) into inhaler and inhale daily.     tiZANidine 4 MG tablet  Commonly known as:  ZANAFLEX  Take 4 mg by mouth every 8 (eight) hours as needed for muscle spasms.     warfarin 4 MG tablet  Commonly known as:  COUMADIN  Take 4 mg by mouth daily at 6 PM.         The results of significant diagnostics from this hospitalization (including imaging, microbiology, ancillary and laboratory) are listed below for reference.    Significant Diagnostic Studies: Dg Chest 2 View  01/29/2015   CLINICAL DATA:  Acute respiratory failure, aspiration pneumonia.  EXAM: CHEST  2 VIEW  COMPARISON:  January 26, 2015.  FINDINGS: Stable cardiomegaly. Stable central pulmonary vascular congestion is  noted. Status post coronary artery bypass graft. No pneumothorax is noted. Stable small linear densities are noted laterally in left lung base concerning for scarring or possibly subsegmental atelectasis. Right basilar opacity is decreased consistent with slightly improved pneumonia or atelectasis. Interval development of minimal pleural effusion is noted.  IMPRESSION: Stable cardiomegaly and central pulmonary vascular congestion is noted. Slightly decreased right basilar opacity is noted consistent with slightly improved pneumonia or atelectasis, although there is interval development of minimal right pleural effusion. Continued radiographic follow-up is recommended.   Electronically Signed   By: Marijo Conception, M.D.   On: 01/29/2015 09:58   Dg Thoracic Spine 2 View  01/28/2015   CLINICAL DATA:  Severe upper back pain.  Initial encounter.  EXAM: THORACIC SPINE - 2-3 VIEWS  COMPARISON:  CT chest 08/15/2014.  FINDINGS: There is no fracture or malalignment. Multilevel anterior endplate spurring is worst in the mid and lower thoracic spine and unchanged in appearance. There is mild convex left scoliosis with the apex at approximately L1. Six intact median sternotomy wires are again seen. The patient is status post CABG.  IMPRESSION: No acute abnormality.  No change in the appearance of thoracic spondylosis.   Electronically Signed   By: Inge Rise M.D.   On: 01/28/2015 14:47   Dg Lumbar Spine 2-3 Views  01/28/2015   CLINICAL DATA:  Back pain  EXAM: LUMBAR SPINE - 2-3 VIEW  COMPARISON:  Lumbar spine MRI 01/20/2007  FINDINGS: 5 non rib-bearing lumbar type vertebral bodies  are identified. Disc degenerative change again noted most prominent at L4-L5 and L5-S1. 2 mm anterolisthesis of L3 on L4. Vertebral body heights are preserved.  IMPRESSION: Mild lower lumbar spine disc degenerative change.   Electronically Signed   By: Conchita Paris M.D.   On: 01/28/2015 14:51   Dg Abd 1 View  02/06/2015   CLINICAL  DATA:  Constipation.  Evaluate for PEG tube placement.  EXAM: ABDOMEN - 1 VIEW  COMPARISON:  02/05/2015  FINDINGS: There is contrast in nondilated loops of colon. Significant stool burden noted. No evidence for large or small bowel obstruction. Degenerative changes seen in the lower thoracic and lumbar spine.  IMPRESSION: Significant retained contrast in the colon. Significant stool burden.   Electronically Signed   By: Nolon Nations M.D.   On: 02/06/2015 11:35   Mr Lumbar Spine W Wo Contrast  02/01/2015   CLINICAL DATA:  Diabetic patient with low back pain extending to both buttocks. Recent admission for multi lobar pneumonia. Sepsis with group B Streptococcus.  EXAM: MRI LUMBAR SPINE WITHOUT AND WITH CONTRAST  TECHNIQUE: Multiplanar and multiecho pulse sequences of the lumbar spine were obtained without and with intravenous contrast.  CONTRAST:  78m MULTIHANCE GADOBENATE DIMEGLUMINE 529 MG/ML IV SOLN  COMPARISON:  Plain radiographs 01/28/2015 appear  unremarkable.  FINDINGS: Segmentation: Normal  Alignment: 3 mm anterolisthesis L3-4 is facet mediated. Alignment is otherwise anatomic.  Vertebrae: No worrisome osseous lesion.Abnormal edema and early endplate erosive change above and below the L4-5 interspace. This is accompanied by a T2 and STIR hyperintensity of that disc space along with a regional ventral epidural collection described below. Findings are concerning for osteomyelitis.  Conus medullaris: Normal in size, signal, and location.  Paraspinal tissues: No evidence for hydronephrosis or paravertebral mass. 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  Disc levels:  L1-L2:  Normal.  L2-L3: Central and leftward protrusion. Mild facet arthropathy. LEFT subarticular zone narrowing could affect the L3 nerve root. No significant spinal stenosis.  L3-L4: 3 mm of facet mediated anterolisthesis. Broad-based central protrusion. Moderate facet and ligamentum flavum hypertrophy. Asymmetric enhancement  and joint effusion of the LEFT L3-4 facet; LEFT septic arthritis is likely. LEFT subarticular zone narrowing could affect the LEFT L4 nerve root. Marked anterior osteophytic spurring.  L4-L5: T2 hyperintense disc with central protrusion. BILATERAL facet and ligamentum flavum hypertrophy. Enhancing ventral epidural collection, up to 6 mm thickness behind the L4 vertebral body, with mass effect on the ventral thecal sac and RIGHT greater than LEFT L5 nerve roots. BILATERAL foraminal narrowing, multifactorial, likely to affect the RIGHT greater than LEFT L4 nerve roots as well. Central nonenhancing collection likely representing purulence. Epidural abscess is suspected.  L5-S1: Disc space narrowing. Mild facet arthropathy. No impingement.  IMPRESSION: Findings consistent with L4-5 diskitis, regional osteomyelitis, and shallow ventral epidural abscess behind the L4 vertebral body.  Multifactorial neural impingement at the L4-5 level related to disc material, posterior element hypertrophy, and enhancing ventral epidural soft tissue. RIGHT greater than LEFT L4 and L5 nerve root impingement.  3 mm of facet mediated slip L3-L4 with broad-based central protrusion. Posterior element hypertrophy with asymmetric enhancement and effusion of the LEFT L3-4 facet; these findings are most consistent with septic arthritis of that joint. LEFT L4 nerve root impingement is likely.  Findings discussed with ordering provider at time of interpretation.   Electronically Signed   By: JStaci RighterM.D.   On: 02/01/2015 12:36   Ir Gastrostomy Tube Mod Sed  02/06/2015  CLINICAL DATA:  Dysphagia, head neck cancer, status post radiation, failed swallowing exam  EXAM: FLUOROSCOPIC 20 FRENCH PULL-THROUGH GASTROSTOMY  Date:  8/2/20168/08/2014 10:47 am  Radiologist:  M. Daryll Brod, MD  Guidance:  Fluoroscopic  FLUOROSCOPY TIME:  1 minutes 54 seconds, 60 mGy  MEDICATIONS AND MEDICAL HISTORY: Patient is already receiving Rocephin IV daily.1 mg  Versed, 50 mcg fentanyl  ANESTHESIA/SEDATION: 7 minutes  CONTRAST:  34mL OMNIPAQUE IOHEXOL 300 MG/ML  SOLN  COMPLICATIONS: None immediate  PROCEDURE: Informed consent was obtained from the patient following explanation of the procedure, risks, benefits and alternatives. The patient understands, agrees and consents for the procedure. All questions were addressed. A time out was performed.  Maximal barrier sterile technique utilized including caps, mask, sterile gowns, sterile gloves, large sterile drape, hand hygiene, and betadine prep.  The left upper quadrant was sterilely prepped and draped. An oral gastric catheter was inserted into the stomach under fluoroscopy. The existing nasogastric feeding tube was removed. Air was injected into the stomach for insufflation and visualization under fluoroscopy. The air distended stomach was confirmed beneath the anterior abdominal wall in the frontal and lateral projections. Under sterile conditions and local anesthesia, a 17 gauge trocar needle was utilized to access the stomach percutaneously beneath the left subcostal margin. Needle position was confirmed within the stomach under biplane fluoroscopy. Contrast injection confirmed position also. A single T tack was deployed for gastropexy. Over an Amplatz guide wire, a 9-French sheath was inserted into the stomach. A snare device was utilized to capture the oral gastric catheter. The snare device was pulled retrograde from the stomach up the esophagus and out the oropharynx. The 20-French pull-through gastrostomy was connected to the snare device and pulled antegrade through the oropharynx down the esophagus into the stomach and then through the percutaneous tract external to the patient. The gastrostomy was assembled externally. Contrast injection confirms position in the stomach. Images were obtained for documentation. The patient tolerated procedure well. No immediate complication.  IMPRESSION: Fluoroscopic insertion of  a 20-French "pull-through" gastrostomy.   Electronically Signed   By: Jerilynn Mages.  Shick M.D.   On: 02/06/2015 13:08   Dg Chest Port 1 View  01/26/2015   CLINICAL DATA:  Shortness of Breath  EXAM: PORTABLE CHEST - 1 VIEW  COMPARISON:  Chest CT August 15, 2014  FINDINGS: There is airspace consolidation in portions of the right middle and lower lobes. There is mild atelectasis in left base. Heart is enlarged with pulmonary vascularity within normal limits. No adenopathy. Patient is status post coronary artery bypass grafting.  IMPRESSION: Areas of patchy infiltrate in the right middle and lower lobes. Left base atelectasis.  Followup PA and lateral chest X radiographs recommended in 3-4 weeks following trial of antibiotic therapy to ensure resolution and exclude underlying malignancy.   Electronically Signed   By: Lowella Grip III M.D.   On: 01/26/2015 20:22   Dg Abd 2 Views  02/05/2015   CLINICAL DATA:  Ileus, planned PEG tube placement  EXAM: ABDOMEN - 2 VIEW  COMPARISON:  None  FINDINGS: Significant stool in transverse and proximal descending colon.  Retained contrast in RIGHT colon through hepatic flexure to proximal transverse colon.  Prominent small bowel loops in the mid abdomen could reflect ileus.  Small amount of stool in rectum.  No evidence of bowel obstruction or bowel wall thickening.  Bones demineralized.  Emphysematous changes with atelectasis and potential scarring at lung bases.  IMPRESSION: Stool in transverse through proximal descending colon and  contrast in the RIGHT colon to proximal transverse colon.  Air-filled loops of small bowel in the mid abdomen, overall nonobstructive pattern, question ileus.   Electronically Signed   By: Lavonia Dana M.D.   On: 02/05/2015 09:44   Dg Swallowing Func-speech Pathology  02/03/2015    Objective Swallowing Evaluation:    Patient Details  Name: WILLEY DUE MRN: [id number] Date of Birth: 1945/08/07  Today's Date: 02/03/2015 Time: SLP Start Time (ACUTE ONLY):  1100-SLP Stop Time (ACUTE ONLY): 1134 SLP Time Calculation (min) (ACUTE ONLY): 34 min  Past Medical History:  Past Medical History  Diagnosis Date  . Barrett esophagus     "pretty bad" (02/01/2015)  . Hypertension   . Hyperlipidemia   . Multiple pulmonary nodules   . CHF (congestive heart failure)   . Lumbar spinal stenosis   . COPD (chronic obstructive pulmonary disease)   . CAD (coronary artery disease)   . On home oxygen therapy     "2L unless I'm outside" (02/01/2015)  . DVT (deep venous thrombosis) 11/2013; 02/01/2015    [name] 01/18/2014; "got one behind my right knee now" (02/01/2015)  . Parkinson's disease     /notes 08/15/2014  . Recurrent aspiration pneumonia   . Sleep apnea     "gave machine back; couldn't use it" (02/01/2015)  . Hypothyroidism   . Type II diabetes mellitus   . Iron deficiency anemia     "real bad" (02/01/2015)  . GERD (gastroesophageal reflux disease)   . Seizures     "don't know what they are from; last one was ~ 4 months ago" (02/01/2015)   . DJD (degenerative joint disease)     [name] 08/15/2014  . Arthritis     "real bad in my back" (02/01/2015)  . Chronic lower back pain   . History of gout   . Depression     "maybe a touch before I started taking seizure RX" (02/01/2015)  . Anxiety   . Basal cell carcinoma     "all over my face and body"   . Throat cancer     [name] 08/15/2014  . Heart attack 03/20/1995   Past Surgical History:  Past Surgical History  Procedure Laterality Date  . Anterior cervical decomp/discectomy fusion  X 2  . Radical neck dissection Right ~ 1998    "throat cancer"  . Total knee arthroplasty Right 2011  . Thyroidectomy  ~ 1996 X 2    "took 1/2 out at a time"  . Cataract extraction w/ intraocular lens  implant, bilateral Bilateral   . Tonsillectomy    . Excisional hemorrhoidectomy    . Joint replacement    . Back surgery    . Posterior fusion cervical spine  X 1  . Coronary angioplasty with stent placement      "I've got at least 3" (02/01/2015)  . Coronary artery bypass graft   1996    "CABG X3"  . Basal cell carcinoma excision      "all over my face and body"    HPI:  Other Pertinent Information: This is a 69 year old male patient with  multiple medical problems including Goodpasture's disease with positive  ANCA on chronic prednisone, Barrett's esophagitis, hypertension, COPD on  chronic oxygen 2 L/m, Parkinson's disease, CAD, multiple pulmonary  nodules, history of DVT June 2015. Patient was recently admitted to  [facility] on 7/22 secondary to sepsis from aspiration  pneumonitis and later found to have group B streptococcal bacteremia. MRI  of the lumbar spine without contrast revealed L4-L5 discitis and vertebral  osteomyelitis with epidural abscess. Pt has a h/o chronic dysphagia s/p  XRT and surgical intervention for throat cancer, s/p ACDF, and possibly  impacted by PD. Pt has had previous swallowing evaluations at outside  facilities, most recently recommending nectar thick liquids.  No Data Recorded  Assessment / Plan / Recommendation CHL IP CLINICAL IMPRESSIONS 02/03/2015  Therapy Diagnosis Moderate to severe pharyngeal phase dysphagia;Moderate   oral phase dysphagia  Clinical Impression Pt with chronic dysphagia which is suspected to be  largely impacted from history of throat cancer and negative changes  following radiation therapy course.  Pt with moderate to severe  oropharyngeal dysphagia: epiglottic inversion very limited, poor laryngeal  elevation, and poor base of tongue retraction which lead to aspiration of  all consistencies seen both during and after the swallow. Pts swallow  function with limited clearance of bolus, despite multiple swallow  efforts. Moderate amount of residuals remained in the valleculae, pyriform  sinuses, and lateral channels which ended up spilling into laryngeal  vestibule and eventually passed below true vocal cords.  Sensation of  aspirate events not consistent and cough was ineffective of completely  clearing airway. Consulted  with MD as pt with recurrent aspiration PNA and  not safe to consume PO. Recommend NPO with nutrition and medicines via  alternative means. ST to follow up       CHL IP TREATMENT RECOMMENDATION 02/03/2015  Treatment Recommendations Therapy as outlined in treatment plan below     CHL IP DIET RECOMMENDATION 02/03/2015  SLP Diet Recommendations NPO;Alternative means - long-term  Liquid Administration via (None)  Medication Administration (None)  Compensations (None)  Postural Changes and/or Swallow Maneuvers (None)     CHL IP OTHER RECOMMENDATIONS 02/03/2015  Recommended Consults (None)  Oral Care Recommendations Oral care QID  Other Recommendations (None)     No flowsheet data found.   CHL IP FREQUENCY AND DURATION 02/03/2015  Speech Therapy Frequency (ACUTE ONLY) min 2x/week  Treatment Duration 2 weeks     Pertinent Vitals/Pain     SLP Swallow Goals No flowsheet data found.  No flowsheet data found.    CHL IP REASON FOR REFERRAL 02/03/2015  Reason for Referral Objectively evaluate swallowing function            No flowsheet data found.  No flowsheet data found.  Arvil Chaco MA, CCC-SLP Acute Care Speech Language Pathologist          Levi Aland 02/03/2015, 12:19 PM      Microbiology: Recent Results (from the past 240 hour(s))  Culture, blood (routine x 2)     Status: None   Collection Time: 02/01/15  5:46 PM  Result Value Ref Range Status   Specimen Description BLOOD RIGHT ANTECUBITAL  Final   Special Requests BOTTLES DRAWN AEROBIC AND ANAEROBIC 10CC EACH  Final   Culture NO GROWTH 5 DAYS  Final   Report Status 02/06/2015 FINAL  Final  Culture, blood (routine x 2)     Status: None   Collection Time: 02/01/15  5:55 PM  Result Value Ref Range Status   Specimen Description BLOOD LEFT ANTECUBITAL  Final   Special Requests BOTTLES DRAWN AEROBIC AND ANAEROBIC 10CC EACH  Final   Culture NO GROWTH 5 DAYS  Final   Report Status 02/06/2015 FINAL  Final     Labs: Basic Metabolic Panel:  Recent  Labs Lab 02/03/15 0510 02/04/15 0538  02/05/15 0547 02/06/15 0600  02/07/15 0542 02/08/15 0535  NA 136 136  --  136 138 137 138  K 4.2 5.6*  < > 4.4 4.1 4.2 4.2  CL 93* 98*  --  102 97* 98* 97*  CO2 37* 30  --  27 32 31 35*  GLUCOSE 109* 129*  --  130* 94 89 234*  BUN 14 21*  --  22* 16 16 22*  CREATININE 0.77 0.77  --  0.72 0.69 0.81 0.82  CALCIUM 9.2 9.1  --  8.9 8.8* 8.8* 8.6*  MG 2.0  --   --   --  1.8  --   --   < > = values in this interval not displayed. Liver Function Tests:  Recent Labs Lab 02/01/15 1737  AST 18  ALT 15*  ALKPHOS 60  BILITOT 0.6  PROT 6.2*  ALBUMIN 2.5*   No results for input(s): LIPASE, AMYLASE in the last 168 hours. No results for input(s): AMMONIA in the last 168 hours. CBC:  Recent Labs Lab 02/01/15 1737  02/03/15 0510 02/04/15 0538 02/05/15 0547 02/06/15 0600 02/07/15 0542  WBC 8.6  < > 8.3 8.2 9.0 8.3 10.1  NEUTROABS 8.1*  --   --   --   --   --   --   HGB 9.4*  < > 9.6* 11.8* 10.6* 10.3* 11.5*  HCT 30.3*  < > 31.6* 36.0* 34.6* 34.2* 35.9*  MCV 76.1*  < > 76.9* 75.8* 77.1* 76.7* 76.1*  PLT 261  < > 280 PLATELET CLUMPS NOTED ON SMEAR, COUNT APPEARS ADEQUATE 306 293 260  < > = values in this interval not displayed. Cardiac Enzymes: No results for input(s): CKTOTAL, CKMB, CKMBINDEX, TROPONINI in the last 168 hours. BNP: Invalid input(s): POCBNP CBG:  Recent Labs Lab 02/07/15 0750 02/07/15 1121 02/07/15 1625 02/07/15 2252 02/08/15 0736  GLUCAP 80 98 281* 227* 155*    Time coordinating discharge:  Greater than 30 minutes  Signed:  Janda Cargo, DO [facility] Pager: [phone] 02/08/2015, 7:50 AM

## 2015-02-08 NOTE — Progress Notes (Signed)
IV Abx and tube feeding supplies arranged through [facility]. [facility] denied referral to accept patient for [facility] RN PT [facility]. Referred out to Erin Hearing RN CM [facility], awaiting callback.

## 2015-02-08 NOTE — Progress Notes (Signed)
Utilization Review completed. Evanthia Maund RN BSN CM 

## 2015-02-08 NOTE — Progress Notes (Signed)
RN wasted ativan that was left undocumented from 8/2.  Ativan bottle found within RN nursing supplies.  Medication was wasted with Charge RN  And discarded in proper receptacle with medication remaining in bottle.

## 2015-02-08 NOTE — Progress Notes (Signed)
RN Jonelle Sidle wasted ativan from 8/2 that was left undocumented in the pyxis. RN found ativan bottle with med still in it within her nursing stuff. This RN was a witness to seeing RN with the medication and also seeing the medication in the bottle. This RN also wasted this medication and saw RN Jonelle Sidle throw it out.

## 2015-02-08 NOTE — Progress Notes (Signed)
Discharge instructions and prescriptions given to patient.  Patient verbalized understanding of discharge instructions.  Patient PIV removed and area free from redness.    Discharge vitals:  Filed Vitals:   02/08/15 1329  BP: 99/58  Pulse: 60  Temp: 97.3 F (36.3 C)  Resp: 18   Discharge medications:    Medication List    STOP taking these medications        cefTRIAXone 1 g in dextrose 5 % 50 mL     enoxaparin 150 MG/ML injection  Commonly known as:  LOVENOX  Replaced by:  enoxaparin 80 MG/0.8ML injection     isosorbide mononitrate 30 MG 24 hr tablet  Commonly known as:  IMDUR      TAKE these medications        amLODipine 5 MG tablet  Commonly known as:  NORVASC  Take 5 mg by mouth at bedtime.     aspirin EC 81 MG tablet  Take 81 mg by mouth at bedtime.     atorvastatin 40 MG tablet  Commonly known as:  LIPITOR  Take 40 mg by mouth at bedtime.     bisacodyl 10 MG suppository  Commonly known as:  DULCOLAX  Place 1 suppository (10 mg total) rectally daily as needed for moderate constipation.     bisoprolol 10 MG tablet  Commonly known as:  ZEBETA  Take 10 mg by mouth at bedtime.     carbidopa-levodopa 25-100 MG per tablet  Commonly known as:  SINEMET IR  Take 3 tablets by mouth 3 (three) times daily.     cefTRIAXone 40 MG/ML IVPB  Commonly known as:  ROCEPHIN  Inject 50 mLs (2 g total) into the vein daily. Stop after last dose on 03/30/15     clonazePAM 0.5 MG tablet  Commonly known as:  KLONOPIN  Take 0.5 mg by mouth every morning.     clonazePAM 1 MG tablet  Commonly known as:  KLONOPIN  Take 1 mg by mouth at bedtime.     docusate sodium 100 MG capsule  Commonly known as:  COLACE  Take 1 capsule (100 mg total) by mouth 2 (two) times daily.     DRY EYES OP  Place 1 drop into both eyes daily as needed (dry eyes).     enoxaparin 80 MG/0.8ML injection  Commonly known as:  LOVENOX  Inject 0.8 mLs (80 mg total) into the skin every 12 (twelve)  hours.     feeding supplement (JEVITY 1.5 CAL/FIBER) Liqd  Place 355 mLs into feeding tube 4 (four) times daily. 357mL = 1.5 cans     fentaNYL 50 MCG/HR  Commonly known as:  DURAGESIC - dosed mcg/hr  Place 1 patch (50 mcg total) onto the skin every 3 (three) days.     free water Soln  Place 125 mLs into feeding tube 4 (four) times daily. Before AND after each tube feed administration     ICY HOT BACK EX  Apply 1 application topically daily as needed (back pain).     isosorbide dinitrate 5 MG tablet  Commonly known as:  ISORDIL  Place 3 tablets (15 mg total) into feeding tube 2 (two) times daily.     lamoTRIgine 25 MG tablet  Commonly known as:  LAMICTAL  Take 25 mg by mouth 2 (two) times daily.     levothyroxine 137 MCG tablet  Commonly known as:  SYNTHROID, LEVOTHROID  Take 137 mcg by mouth daily.     losartan 100 MG  tablet  Commonly known as:  COZAAR  Take 100 mg by mouth at bedtime.     metFORMIN 500 MG tablet  Commonly known as:  GLUCOPHAGE  Take 500 mg by mouth 2 (two) times daily.     mometasone-formoterol 200-5 MCG/ACT Aero  Commonly known as:  DULERA  Inhale 2 puffs into the lungs 2 (two) times daily.     MULTI-VITAMINS Tabs  Take 1 tablet by mouth daily. Take the same brand of multivitamins while on warfarin (coumadin).  If multivitamin product is changed, let your provider know.  Have INR checked in 3-5 days after taking new product.     nitroGLYCERIN 0.4 MG SL tablet  Commonly known as:  NITROSTAT  Place 0.4 mg under the tongue every 5 (five) minutes x 3 doses as needed for chest pain.     omeprazole 20 MG capsule  Commonly known as:  PRILOSEC  Take 40 mg by mouth 2 (two) times daily.     oxyCODONE-acetaminophen 5-325 MG per tablet  Commonly known as:  PERCOCET/ROXICET  Take 1 tablet by mouth every 6 (six) hours as needed for severe pain.     PARoxetine 20 MG tablet  Commonly known as:  PAXIL  Take 20 mg by mouth 3 (three) times daily.      predniSONE 20 MG tablet  Commonly known as:  DELTASONE  Take 1 tablet (20 mg total) by mouth daily with breakfast.     tamsulosin 0.4 MG Caps capsule  Commonly known as:  FLOMAX  Take 0.4 mg by mouth daily.     tiotropium 18 MCG inhalation capsule  Commonly known as:  SPIRIVA  Place 1 capsule (18 mcg total) into inhaler and inhale daily.     tiZANidine 4 MG tablet  Commonly known as:  ZANAFLEX  Take 4 mg by mouth every 8 (eight) hours as needed for muscle spasms.     warfarin 4 MG tablet  Commonly known as:  COUMADIN  Take 4 mg by mouth daily at 6 PM.       Patient friend came to pick him up for discharge.  Patient escorted to car via wheelchair by nurse tech.  Forrestine Him, RN  02/08/15 at [phone]

## 2015-02-08 NOTE — Progress Notes (Signed)
Peripherally Inserted Central Catheter/Midline Placement  The IV Nurse has discussed with the patient and/or persons authorized to consent for the patient, the purpose of this procedure and the potential benefits and risks involved with this procedure.  The benefits include less needle sticks, lab draws from the catheter and patient may be discharged home with the catheter.  Risks include, but not limited to, infection, bleeding, blood clot (thrombus formation), and puncture of an artery; nerve damage and irregular heat beat.  Alternatives to this procedure were also discussed.  PICC/Midline Placement Documentation        Darlyn Read 02/08/2015, 9:22 AM

## 2015-02-22 ENCOUNTER — Encounter: Admission: RE | Payer: Self-pay | Source: Ambulatory Visit

## 2015-02-22 ENCOUNTER — Ambulatory Visit: Admission: RE | Admit: 2015-02-22 | Payer: Medicare Other | Source: Ambulatory Visit | Admitting: Gastroenterology

## 2015-02-22 SURGERY — ESOPHAGOGASTRODUODENOSCOPY (EGD) WITH PROPOFOL
Anesthesia: General

## 2015-03-06 ENCOUNTER — Inpatient Hospital Stay
Admission: AD | Admit: 2015-03-06 | Discharge: 2015-03-08 | DRG: 056 | Disposition: A | Discharge status: Home / Self Care | Payer: Medicare Other | Source: Ambulatory Visit | Attending: Internal Medicine | Admitting: Internal Medicine

## 2015-03-06 ENCOUNTER — Inpatient Hospital Stay: Payer: Medicare Other

## 2015-03-06 ENCOUNTER — Other Ambulatory Visit: Payer: Self-pay | Admitting: Internal Medicine

## 2015-03-06 ENCOUNTER — Encounter: Payer: Self-pay | Admitting: Internal Medicine

## 2015-03-06 DIAGNOSIS — I252 Old myocardial infarction: Secondary | ICD-10-CM | POA: Diagnosis not present

## 2015-03-06 DIAGNOSIS — Y939 Activity, unspecified: Secondary | ICD-10-CM | POA: Diagnosis not present

## 2015-03-06 DIAGNOSIS — Z7901 Long term (current) use of anticoagulants: Secondary | ICD-10-CM

## 2015-03-06 DIAGNOSIS — E860 Dehydration: Secondary | ICD-10-CM | POA: Diagnosis present

## 2015-03-06 DIAGNOSIS — I951 Orthostatic hypotension: Secondary | ICD-10-CM | POA: Diagnosis not present

## 2015-03-06 DIAGNOSIS — E039 Hypothyroidism, unspecified: Secondary | ICD-10-CM | POA: Diagnosis present

## 2015-03-06 DIAGNOSIS — Z96651 Presence of right artificial knee joint: Secondary | ICD-10-CM | POA: Diagnosis not present

## 2015-03-06 DIAGNOSIS — K219 Gastro-esophageal reflux disease without esophagitis: Secondary | ICD-10-CM | POA: Diagnosis not present

## 2015-03-06 DIAGNOSIS — Z8261 Family history of arthritis: Secondary | ICD-10-CM

## 2015-03-06 DIAGNOSIS — G4730 Sleep apnea, unspecified: Secondary | ICD-10-CM | POA: Diagnosis present

## 2015-03-06 DIAGNOSIS — R768 Other specified abnormal immunological findings in serum: Secondary | ICD-10-CM | POA: Diagnosis present

## 2015-03-06 DIAGNOSIS — M5030 Other cervical disc degeneration, unspecified cervical region: Secondary | ICD-10-CM | POA: Diagnosis present

## 2015-03-06 DIAGNOSIS — J9610 Chronic respiratory failure, unspecified whether with hypoxia or hypercapnia: Secondary | ICD-10-CM | POA: Diagnosis present

## 2015-03-06 DIAGNOSIS — E785 Hyperlipidemia, unspecified: Secondary | ICD-10-CM | POA: Diagnosis not present

## 2015-03-06 DIAGNOSIS — M4626 Osteomyelitis of vertebra, lumbar region: Secondary | ICD-10-CM | POA: Diagnosis not present

## 2015-03-06 DIAGNOSIS — Z9841 Cataract extraction status, right eye: Secondary | ICD-10-CM

## 2015-03-06 DIAGNOSIS — J690 Pneumonitis due to inhalation of food and vomit: Secondary | ICD-10-CM | POA: Diagnosis present

## 2015-03-06 DIAGNOSIS — Z86718 Personal history of other venous thrombosis and embolism: Secondary | ICD-10-CM

## 2015-03-06 DIAGNOSIS — Z808 Family history of malignant neoplasm of other organs or systems: Secondary | ICD-10-CM | POA: Diagnosis not present

## 2015-03-06 DIAGNOSIS — Z931 Gastrostomy status: Secondary | ICD-10-CM | POA: Diagnosis not present

## 2015-03-06 DIAGNOSIS — Z85819 Personal history of malignant neoplasm of unspecified site of lip, oral cavity, and pharynx: Secondary | ICD-10-CM | POA: Diagnosis not present

## 2015-03-06 DIAGNOSIS — G20 Parkinson's disease: Secondary | ICD-10-CM | POA: Diagnosis present

## 2015-03-06 DIAGNOSIS — Z7952 Long term (current) use of systemic steroids: Secondary | ICD-10-CM | POA: Diagnosis not present

## 2015-03-06 DIAGNOSIS — Z800 Family history of malignant neoplasm of digestive organs: Secondary | ICD-10-CM | POA: Diagnosis not present

## 2015-03-06 DIAGNOSIS — M4620 Osteomyelitis of vertebra, site unspecified: Secondary | ICD-10-CM | POA: Diagnosis present

## 2015-03-06 DIAGNOSIS — Z951 Presence of aortocoronary bypass graft: Secondary | ICD-10-CM

## 2015-03-06 DIAGNOSIS — I2510 Atherosclerotic heart disease of native coronary artery without angina pectoris: Secondary | ICD-10-CM | POA: Diagnosis present

## 2015-03-06 DIAGNOSIS — G062 Extradural and subdural abscess, unspecified: Secondary | ICD-10-CM | POA: Diagnosis present

## 2015-03-06 DIAGNOSIS — Y929 Unspecified place or not applicable: Secondary | ICD-10-CM

## 2015-03-06 DIAGNOSIS — Z806 Family history of leukemia: Secondary | ICD-10-CM

## 2015-03-06 DIAGNOSIS — Z85828 Personal history of other malignant neoplasm of skin: Secondary | ICD-10-CM

## 2015-03-06 DIAGNOSIS — Z961 Presence of intraocular lens: Secondary | ICD-10-CM | POA: Diagnosis present

## 2015-03-06 DIAGNOSIS — D649 Anemia, unspecified: Secondary | ICD-10-CM | POA: Diagnosis present

## 2015-03-06 DIAGNOSIS — Z9981 Dependence on supplemental oxygen: Secondary | ICD-10-CM

## 2015-03-06 DIAGNOSIS — R609 Edema, unspecified: Secondary | ICD-10-CM | POA: Diagnosis present

## 2015-03-06 DIAGNOSIS — I10 Essential (primary) hypertension: Secondary | ICD-10-CM | POA: Diagnosis present

## 2015-03-06 DIAGNOSIS — I509 Heart failure, unspecified: Secondary | ICD-10-CM | POA: Diagnosis present

## 2015-03-06 DIAGNOSIS — T66XXXA Radiation sickness, unspecified, initial encounter: Secondary | ICD-10-CM | POA: Diagnosis present

## 2015-03-06 DIAGNOSIS — G40909 Epilepsy, unspecified, not intractable, without status epilepticus: Secondary | ICD-10-CM | POA: Diagnosis not present

## 2015-03-06 DIAGNOSIS — K2270 Barrett's esophagus without dysplasia: Secondary | ICD-10-CM | POA: Diagnosis not present

## 2015-03-06 DIAGNOSIS — R918 Other nonspecific abnormal finding of lung field: Secondary | ICD-10-CM | POA: Diagnosis present

## 2015-03-06 DIAGNOSIS — Y999 Unspecified external cause status: Secondary | ICD-10-CM

## 2015-03-06 DIAGNOSIS — Y842 Radiological procedure and radiotherapy as the cause of abnormal reaction of the patient, or of later complication, without mention of misadventure at the time of the procedure: Secondary | ICD-10-CM | POA: Diagnosis present

## 2015-03-06 DIAGNOSIS — C140 Malignant neoplasm of pharynx, unspecified: Secondary | ICD-10-CM | POA: Diagnosis present

## 2015-03-06 DIAGNOSIS — G20A1 Parkinson's disease without dyskinesia, without mention of fluctuations: Secondary | ICD-10-CM | POA: Diagnosis present

## 2015-03-06 DIAGNOSIS — E119 Type 2 diabetes mellitus without complications: Secondary | ICD-10-CM | POA: Diagnosis not present

## 2015-03-06 DIAGNOSIS — I959 Hypotension, unspecified: Secondary | ICD-10-CM | POA: Diagnosis present

## 2015-03-06 DIAGNOSIS — Z9842 Cataract extraction status, left eye: Secondary | ICD-10-CM

## 2015-03-06 DIAGNOSIS — Z87891 Personal history of nicotine dependence: Secondary | ICD-10-CM

## 2015-03-06 DIAGNOSIS — J449 Chronic obstructive pulmonary disease, unspecified: Secondary | ICD-10-CM | POA: Diagnosis present

## 2015-03-06 LAB — CBC WITH DIFFERENTIAL/PLATELET
BASOS ABS: 0 10*3/uL (ref 0–0.1)
BASOS PCT: 1 %
EOS ABS: 0.1 10*3/uL (ref 0–0.7)
EOS PCT: 3 %
HCT: 30 % — ABNORMAL LOW (ref 40.0–52.0)
HEMOGLOBIN: 9.4 g/dL — AB (ref 13.0–18.0)
LYMPHS ABS: 0.4 10*3/uL — AB (ref 1.0–3.6)
Lymphocytes Relative: 9 %
MCH: 24.4 pg — ABNORMAL LOW (ref 26.0–34.0)
MCHC: 31.3 g/dL — ABNORMAL LOW (ref 32.0–36.0)
MCV: 78 fL — ABNORMAL LOW (ref 80.0–100.0)
Monocytes Absolute: 0.7 10*3/uL (ref 0.2–1.0)
Monocytes Relative: 16 %
NEUTROS PCT: 71 %
Neutro Abs: 3.3 10*3/uL (ref 1.4–6.5)
PLATELETS: 159 10*3/uL (ref 150–440)
RBC: 3.85 MIL/uL — AB (ref 4.40–5.90)
RDW: 20.8 % — ABNORMAL HIGH (ref 11.5–14.5)
WBC: 4.6 10*3/uL (ref 3.8–10.6)

## 2015-03-06 LAB — URINALYSIS COMPLETE WITH MICROSCOPIC (ARMC ONLY)
BILIRUBIN URINE: NEGATIVE
Bacteria, UA: NONE SEEN
Glucose, UA: NEGATIVE mg/dL
Hgb urine dipstick: NEGATIVE
KETONES UR: NEGATIVE mg/dL
NITRITE: NEGATIVE
PH: 7 (ref 5.0–8.0)
Protein, ur: 30 mg/dL — AB
Specific Gravity, Urine: 1.019 (ref 1.005–1.030)
Squamous Epithelial / LPF: NONE SEEN

## 2015-03-06 LAB — COMPREHENSIVE METABOLIC PANEL
ALT: 23 U/L (ref 17–63)
AST: 21 U/L (ref 15–41)
Albumin: 3.3 g/dL — ABNORMAL LOW (ref 3.5–5.0)
Alkaline Phosphatase: 69 U/L (ref 38–126)
Anion gap: 7 (ref 5–15)
BILIRUBIN TOTAL: 0.3 mg/dL (ref 0.3–1.2)
BUN: 33 mg/dL — AB (ref 6–20)
CHLORIDE: 97 mmol/L — AB (ref 101–111)
CO2: 32 mmol/L (ref 22–32)
CREATININE: 1.25 mg/dL — AB (ref 0.61–1.24)
Calcium: 8.8 mg/dL — ABNORMAL LOW (ref 8.9–10.3)
GFR, EST NON AFRICAN AMERICAN: 57 mL/min — AB (ref >60)
Glucose, Bld: 111 mg/dL — ABNORMAL HIGH (ref 65–99)
Potassium: 4.6 mmol/L (ref 3.5–5.1)
Sodium: 136 mmol/L (ref 135–145)
TOTAL PROTEIN: 6.7 g/dL (ref 6.5–8.1)

## 2015-03-06 LAB — GLUCOSE, CAPILLARY
GLUCOSE-CAPILLARY: 171 mg/dL — AB (ref 65–99)
Glucose-Capillary: 102 mg/dL — ABNORMAL HIGH (ref 65–99)
Glucose-Capillary: 66 mg/dL (ref 65–99)
Glucose-Capillary: 89 mg/dL (ref 65–99)

## 2015-03-06 LAB — TROPONIN I
TROPONIN I: 0.03 ng/mL (ref <0.031)
Troponin I: <0.03 ng/mL (ref <0.031)

## 2015-03-06 LAB — CORTISOL: CORTISOL PLASMA: 10.5 ug/dL

## 2015-03-06 LAB — PROTIME-INR
INR: 1.52
PROTHROMBIN TIME: 18.5 s — AB (ref 11.4–15.0)

## 2015-03-06 LAB — SEDIMENTATION RATE: SED RATE: 61 mm/h — AB (ref 0–16)

## 2015-03-06 LAB — TSH: TSH: 3.101 u[IU]/mL (ref 0.350–4.500)

## 2015-03-06 MED ORDER — JEVITY 1.5 CAL/FIBER PO LIQD
360.0000 mL | Freq: Four times a day (QID) | ORAL | Status: DC
  Administered 2015-03-06 – 2015-03-08 (×6): 360 mL

## 2015-03-06 MED ORDER — LEVOTHYROXINE SODIUM 50 MCG PO TABS
137.0000 ug | ORAL_TABLET | Freq: Every day | ORAL | Status: DC
  Administered 2015-03-07 – 2015-03-08 (×2): 137 ug via ORAL
  Filled 2015-03-06 (×2): qty 1

## 2015-03-06 MED ORDER — TRAZODONE HCL 50 MG PO TABS
25.0000 mg | ORAL_TABLET | Freq: Every evening | ORAL | Status: DC | PRN
  Administered 2015-03-06: 25 mg via ORAL
  Filled 2015-03-06: qty 2

## 2015-03-06 MED ORDER — WARFARIN SODIUM 3 MG PO TABS: 3.0000 mg | ORAL_TABLET | Freq: Every day | ORAL | Status: DC

## 2015-03-06 MED ORDER — ACETAMINOPHEN 80 MG PO CHEW: 500.0000 mg | CHEWABLE_TABLET | Freq: Four times a day (QID) | ORAL | Status: DC | PRN

## 2015-03-06 MED ORDER — ATORVASTATIN CALCIUM 20 MG PO TABS
40.0000 mg | ORAL_TABLET | Freq: Every day | ORAL | Status: DC
  Administered 2015-03-06 – 2015-03-07 (×2): 40 mg via ORAL
  Filled 2015-03-06 (×3): qty 2

## 2015-03-06 MED ORDER — INSULIN ASPART 100 UNIT/ML ~~LOC~~ SOLN
0.0000 [IU] | SUBCUTANEOUS | Status: DC
Start: 2015-03-06 — End: 2015-03-07
  Administered 2015-03-06: 4 [IU] via SUBCUTANEOUS
  Administered 2015-03-07: 2 [IU] via SUBCUTANEOUS
  Filled 2015-03-06: qty 4
  Filled 2015-03-06: qty 2

## 2015-03-06 MED ORDER — WARFARIN SODIUM 5 MG PO TABS
7.5000 mg | ORAL_TABLET | Freq: Once | ORAL | Status: AC
  Administered 2015-03-06: 7.5 mg via ORAL
  Filled 2015-03-06: qty 2

## 2015-03-06 MED ORDER — FENTANYL 25 MCG/HR TD PT72: 25.0000 ug | MEDICATED_PATCH | TRANSDERMAL | Status: DC

## 2015-03-06 MED ORDER — ACETAMINOPHEN 500 MG PO TABS: 500.0000 mg | ORAL_TABLET | Freq: Four times a day (QID) | ORAL | Status: DC | PRN

## 2015-03-06 MED ORDER — ASPIRIN 81 MG PO CHEW
81.0000 mg | CHEWABLE_TABLET | Freq: Every day | ORAL | Status: DC
  Administered 2015-03-07 – 2015-03-08 (×2): 81 mg
  Filled 2015-03-06 (×2): qty 1

## 2015-03-06 MED ORDER — SODIUM CHLORIDE 0.9 % IJ SOLN: 3.0000 mL | Freq: Two times a day (BID) | INTRAMUSCULAR | Status: DC

## 2015-03-06 MED ORDER — MORPHINE SULFATE (PF) 2 MG/ML IV SOLN
1.0000 mg | INTRAVENOUS | Status: DC | PRN
  Administered 2015-03-07 (×2): 1 mg via INTRAVENOUS
  Filled 2015-03-06 (×2): qty 1

## 2015-03-06 MED ORDER — LAMOTRIGINE 25 MG PO TABS
25.0000 mg | ORAL_TABLET | Freq: Every day | ORAL | Status: DC
  Administered 2015-03-07 – 2015-03-08 (×2): 25 mg
  Filled 2015-03-06 (×3): qty 1

## 2015-03-06 MED ORDER — WARFARIN - PHARMACIST DOSING INPATIENT: Freq: Every day | Status: DC

## 2015-03-06 MED ORDER — OMEPRAZOLE 2 MG/ML ORAL SUSPENSION
20.0000 mg | Freq: Two times a day (BID) | ORAL | Status: DC
  Filled 2015-03-06 (×2): qty 10

## 2015-03-06 MED ORDER — CLONAZEPAM 0.5 MG PO TBDP: 0.5000 mg | ORAL_TABLET | Freq: Two times a day (BID) | ORAL | Status: DC | PRN

## 2015-03-06 MED ORDER — PIPERACILLIN-TAZOBACTAM 3.375 G IVPB
3.3750 g | Freq: Three times a day (TID) | INTRAVENOUS | Status: DC
  Administered 2015-03-06 – 2015-03-08 (×6): 3.375 g via INTRAVENOUS
  Filled 2015-03-06 (×7): qty 50

## 2015-03-06 MED ORDER — PANTOPRAZOLE SODIUM 40 MG PO PACK
40.0000 mg | PACK | Freq: Two times a day (BID) | ORAL | Status: DC
  Administered 2015-03-06 – 2015-03-08 (×4): 40 mg
  Filled 2015-03-06 (×4): qty 20

## 2015-03-06 MED ORDER — PIPERACILLIN-TAZOBACTAM 3.375 G IVPB 30 MIN
3.3750 g | Freq: Once | INTRAVENOUS | Status: DC
  Filled 2015-03-06: qty 50

## 2015-03-06 MED ORDER — CLONAZEPAM 0.5 MG PO TABS
0.5000 mg | ORAL_TABLET | Freq: Two times a day (BID) | ORAL | Status: DC | PRN
  Administered 2015-03-06: 0.5 mg via ORAL
  Filled 2015-03-06: qty 1

## 2015-03-06 MED ORDER — PAROXETINE HCL 20 MG PO TABS
20.0000 mg | ORAL_TABLET | Freq: Every day | ORAL | Status: DC
  Administered 2015-03-07 – 2015-03-08 (×2): 20 mg
  Filled 2015-03-06: qty 2
  Filled 2015-03-06: qty 1

## 2015-03-06 MED ORDER — SODIUM CHLORIDE 0.9 % IV SOLN
INTRAVENOUS | Status: DC
  Administered 2015-03-06: 12:00:00 via INTRAVENOUS

## 2015-03-06 MED ORDER — SODIUM CHLORIDE 0.9 % IJ SOLN: 3.0000 mL | INTRAMUSCULAR | Status: DC | PRN

## 2015-03-06 MED ORDER — INFLUENZA VAC SPLIT QUAD 0.5 ML IM SUSY
0.5000 mL | PREFILLED_SYRINGE | INTRAMUSCULAR | Status: AC
Start: 2015-03-07 — End: 2015-03-07
  Administered 2015-03-07: 0.5 mL via INTRAMUSCULAR
  Filled 2015-03-06: qty 0.5

## 2015-03-06 NOTE — Consult Note (Signed)
ANTICOAGULATION CONSULT NOTE - Initial Consult  Pharmacy Consult for warfarin Indication: VTE prophylaxis  Allergies  Allergen Reactions  . Other Other (See Comments)    Dizziness, lightheadedness, Pt states that inhaled medications make him depressed and have negative thoughts.      Patient Measurements: Height: 5\' 9"  (175.3 cm) Weight: 184 lb (83.462 kg) IBW/kg (Calculated) : 70.7 Heparin Dosing Weight:   Vital Signs: Temp: 97.5 F (36.4 C) (08/30 1109) Temp Source: Oral (08/30 1109) BP: 95/54 mmHg (08/30 1109) Pulse Rate: 53 (08/30 1109)  Labs:  Recent Labs  03/06/15 1119 03/06/15 1224  HGB 9.4*  --   HCT 30.0*  --   PLT 159  --   LABPROT  --  18.5*  INR  --  1.52  CREATININE 1.25*  --   TROPONINI <0.03  --     Estimated Creatinine Clearance: 55.8 mL/min (by C-G formula based on Cr of 1.25).   Medical History: Past Medical History  Diagnosis Date  . Barrett esophagus   . Hypertension   . Hyperlipidemia   . Multiple pulmonary nodules   . CHF (congestive heart failure)   . Lumbar spinal stenosis   . COPD (chronic obstructive pulmonary disease)   . CAD (coronary artery disease)   . On home oxygen therapy   . DVT (deep venous thrombosis) 11/2013; 02/01/2015  . Parkinson's disease   . Recurrent aspiration pneumonia   . Sleep apnea   . Hypothyroidism   . Type II diabetes mellitus   . Iron deficiency anemia   . GERD (gastroesophageal reflux disease)   . Seizures   . DJD (degenerative joint disease)   . Arthritis   . Chronic lower back pain   . History of gout   . Depression   . Anxiety   . Basal cell carcinoma   . Throat cancer   . Heart attack 03/20/1995    Medications:  Scheduled:  . aspirin  81 mg Per Tube Daily  . atorvastatin  40 mg Oral q1800  . feeding supplement (JEVITY 1.5 CAL/FIBER)  360 mL Per Tube QID  . fentaNYL  25 mcg Transdermal Q72H  . [START ON 03/07/2015] Influenza vac split quadrivalent PF  0.5 mL Intramuscular Tomorrow-1000   . insulin aspart  0-24 Units Subcutaneous 6 times per day  . lamoTRIgine  25 mg Per Tube Daily  . [START ON 03/07/2015] levothyroxine  137 mcg Oral QAC breakfast  . pantoprazole sodium  40 mg Per Tube BID  . PARoxetine  20 mg Per Tube Daily  . piperacillin-tazobactam (ZOSYN)  IV  3.375 g Intravenous 3 times per day  . sodium chloride  3 mL Intravenous Q12H  . warfarin  3 mg Per Tube q1800    Assessment: Pt is a 69 year old male with a recent DVT. Pt INR on 8/4 was 1.16. Today it is 1.52. Per Dr. Jens Som note in Care everywhere, pt is currently on 5mg  daily. However Dr is unsure how compliant the patient has been.  Goal of Therapy:  INR 2-3 Monitor platelets by anticoagulation protocol: Yes   Plan:  Patient INR is subtherapeutic. Will give 7.5mg  tonight. Recheck INR with morning labs  Ramond Dial, Pharm.D Clinical Pharmacist   03/06/2015,2:35 PM

## 2015-03-06 NOTE — Progress Notes (Addendum)
ANTIBIOTIC CONSULT NOTE - INITIAL  Pharmacy Consult for Zosyn Dosing Indication: pneumonia  Allergies  Allergen Reactions  . Other Other (See Comments)    Dizziness, lightheadedness, Pt states that inhaled medications make him depressed and have negative thoughts.      Patient Measurements: Height: 5\' 9"  (175.3 cm) Weight: 184 lb (83.462 kg) IBW/kg (Calculated) : 70.7  Vital Signs: Temp: 97.5 F (36.4 C) (08/30 1109) Temp Source: Oral (08/30 1109) BP: 95/54 mmHg (08/30 1109) Pulse Rate: 53 (08/30 1109) Intake/Output from previous day:   Intake/Output from this shift:    Labs:  Recent Labs  03/06/15 1119  WBC 4.6  HGB 9.4*  PLT 159   CrCl cannot be calculated (Patient has no serum creatinine result on file.). No results for input(s): VANCOTROUGH, VANCOPEAK, VANCORANDOM, GENTTROUGH, GENTPEAK, GENTRANDOM, TOBRATROUGH, TOBRAPEAK, TOBRARND, AMIKACINPEAK, AMIKACINTROU, AMIKACIN in the last 72 hours.   Microbiology: No results found for this or any previous visit (from the past 720 hour(s)).  Medical History: Past Medical History  Diagnosis Date  . Barrett esophagus     "pretty bad" (02/01/2015)  . Hypertension   . Hyperlipidemia   . Multiple pulmonary nodules   . CHF (congestive heart failure)   . Lumbar spinal stenosis   . COPD (chronic obstructive pulmonary disease)   . CAD (coronary artery disease)   . On home oxygen therapy     "2L unless I'm outside" (02/01/2015)  . DVT (deep venous thrombosis) 11/2013; 02/01/2015    [name] 01/18/2014; "got one behind my right knee now" (02/01/2015)  . Parkinson's disease     /notes 08/15/2014  . Recurrent aspiration pneumonia   . Sleep apnea     "gave machine back; couldn't use it" (02/01/2015)  . Hypothyroidism   . Type II diabetes mellitus   . Iron deficiency anemia     "real bad" (02/01/2015)  . GERD (gastroesophageal reflux disease)   . Seizures     "don't know what they are from; last one was ~ 4 months ago" (02/01/2015)   . DJD (degenerative joint disease)     [name] 08/15/2014  . Arthritis     "real bad in my back" (02/01/2015)  . Chronic lower back pain   . History of gout   . Depression     "maybe a touch before I started taking seizure RX" (02/01/2015)  . Anxiety   . Basal cell carcinoma     "all over my face and body"   . Throat cancer     [name] 08/15/2014  . Heart attack 03/20/1995    Medications:  Scheduled:  . aspirin  81 mg Per Tube Daily  . atorvastatin  40 mg Oral q1800  . fentaNYL  25 mcg Transdermal Q72H  . [START ON 03/07/2015] Influenza vac split quadrivalent PF  0.5 mL Intramuscular Tomorrow-1000  . insulin aspart  0-24 Units Subcutaneous 6 times per day  . lamoTRIgine  25 mg Per Tube Daily  . [START ON 03/07/2015] levothyroxine  137 mcg Oral QAC breakfast  . pantoprazole sodium  40 mg Per Tube BID  . PARoxetine  20 mg Per Tube Daily  . piperacillin-tazobactam (ZOSYN)  IV  3.375 g Intravenous 3 times per day  . sodium chloride  3 mL Intravenous Q12H  . warfarin  3 mg Per Tube q1800   Assessment: Patient is a 69 yo male admitted for aspiration pneumonia.  MD consulted pharmacy to dose Zosyn for empiric coverage.  SCr:  Pending  Goal of Therapy:  Resolution of infection  Plan:  Will start patient on Zosyn 3.375 gm IV q8h per EI protocol.  Patient's SCr pending for CrCl estimation.  Will follow up to assess need for dosing adjustment based on renal function. Follow up culture results  Pharmacy will continue to follow.  Ling Flesch G 03/06/2015,12:31 PM  Est CrCl~55.8 mL/min.  Zosyn 3.375 gm IV q8h is appropriate for renal function.  Murrell Converse, PharmD Clinical Pharmacist 03/06/2015

## 2015-03-06 NOTE — Progress Notes (Signed)
Initial Nutrition Assessment   INTERVENTION:   EN: Spoke with MD, [name] this afternoon regarding nutrition poc. MD agreeable to restart TF regimen. Recommend continuing Jevity 1.5 6 cans per day total (1 1/2cans at each feeding, QID) to provide 2133kcals, 92g protein and 1040mL of free water. Will recommend 75mL free water before and after each bolus feeding at this time, secondary to IVF. Will recommend additional 250-369mL BID to meet hydration needs once off IVF. RD aware of h/o DM, will recommend switching to Glucerna feedings if BS elevated.  NUTRITION DIAGNOSIS:   Swallowing difficulty related to chronic illness as evidenced by  (PEG used for enteral nutrition).  GOAL:   Patient will meet greater than or equal to 90% of their needs  MONITOR:    (Energy Intake, Electrolyte and renal Profile, Digestive system, anthropometrics)  REASON FOR ASSESSMENT:   Consult Assessment of nutrition requirement/status  ASSESSMENT:   Pt admitted with orthostatic hypotension. Pt with h/o dysphagia, PEG placed last month for enteral nutrition.  Past Medical History  Diagnosis Date  . Barrett esophagus   . Hypertension   . Hyperlipidemia   . Multiple pulmonary nodules   . CHF (congestive heart failure)   . Lumbar spinal stenosis   . COPD (chronic obstructive pulmonary disease)   . CAD (coronary artery disease)   . On home oxygen therapy   . DVT (deep venous thrombosis) 11/2013; 02/01/2015  . Parkinson's disease   . Recurrent aspiration pneumonia   . Sleep apnea   . Hypothyroidism   . Type II diabetes mellitus   . Iron deficiency anemia   . GERD (gastroesophageal reflux disease)   . Seizures   . DJD (degenerative joint disease)   . Arthritis   . Chronic lower back pain   . History of gout   . Depression   . Anxiety   . Basal cell carcinoma   . Throat cancer   . Heart attack 03/20/1995    Diet Order:  Diet NPO time specified    Food/Nutrition-Related History: Pt reports bolus  feedings going very well PTA. Pt confims taking Jevity 1.5 QID 1 1/2cans at each feeding with 2 syringes full of free water before and after each feeding and with medications (per dietitian note at [facility] on 02/01/2015 pt was receiving 140mL free water before and after each feeding). Pt also remarked that sometimes he puts [employer] in the PEG at home.    Medications: NS at 43mL/hr, novolog Protonix  Electrolyte/Renal Profile and Glucose Profile:   Recent Labs Lab 03/06/15 1119  NA 136  K 4.6  CL 97*  CO2 32  BUN 33*  CREATININE 1.25*  CALCIUM 8.8*  GLUCOSE 111*   Protein Profile:  Recent Labs Lab 03/06/15 1119  ALBUMIN 3.3*    Gastrointestinal Profile: Last BM: unknown   Nutrition-Focused Physical Exam Findings: Nutrition-Focused physical exam completed. Findings are no fat depletion, no muscle depletion, and no edema documented yet, however RN Mya reported some lower extremity swelling.    Weight Change: Pt reports weight of 177lbs at the beginning of August 2016 (after admission with PEG placement) and that he has been gaining weight, 183.2lbs this am at outpatient MD office.   Skin:  Reviewed, no issues  Height:   Ht Readings from Last 1 Encounters:  03/06/15 5\' 9"  (1.753 m)    Weight:   Wt Readings from Last 1 Encounters:  03/06/15 184 lb (83.462 kg)   Wt Readings from Last 10 Encounters:  03/06/15 184 lb (83.462 kg)  02/08/15 183 lb 10.3 oz (83.3 kg)  01/28/15 200 lb 1.6 oz (90.765 kg)  07/12/14 191 lb (86.637 kg)  02/20/14 192 lb (87.091 kg)  11/03/13 191 lb (86.637 kg)  09/02/13 187 lb (84.823 kg)  03/29/13 207 lb (93.895 kg)  02/08/13 207 lb (93.895 kg)  01/26/13 209 lb 3.2 oz (94.892 kg)    BMI:  Body mass index is 27.16 kg/(m^2).  Estimated Nutritional Needs:   Kcal:  9292-4462MMNOT, BEE: 1585kcals, TEE: (IF 1.1-1.3)(AF 1.2)   Protein:  84-100g protein (1.0-1.2g/kg)   Fluid:  2087-2585mL of fluid  (25-56mL/kg)  EDUCATION NEEDS:   No education needs identified at this time   Stokes Rattigan [place], RD, LDN Pager [phone]

## 2015-03-06 NOTE — H&P (Signed)
John Mendoza is an 69 y.o. male.   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  Noted increased leg edema Friday, with BP low, and began complaining of positional dizziness/vision changes.  Evaluated in office, with SBP 100 sitting; pt became symptomatic with dizziness standing, with SBP 72 at that time.  Admitted now with hypotension and for further evaluation  Past Medical History  Diagnosis Date  . Barrett esophagus   . Hypertension   . Hyperlipidemia   . Multiple pulmonary nodules   . CHF (congestive heart failure)   . Lumbar spinal stenosis   . COPD (chronic obstructive pulmonary disease)   . CAD (coronary artery disease)   . On home oxygen therapy   . DVT (deep venous thrombosis) 11/2013; 02/01/2015  . Parkinson's disease   . Recurrent aspiration pneumonia   . Sleep apnea   . Hypothyroidism   . Type II diabetes mellitus   . Iron deficiency anemia   . GERD (gastroesophageal reflux disease)   . Seizures   . DJD (degenerative joint disease)   . Arthritis   . Chronic lower back pain   . History of gout   . Depression   . Anxiety   . Basal cell carcinoma   . Throat cancer   . Heart attack 03/20/1995    Past Surgical History  Procedure Laterality Date  . Anterior cervical decomp/discectomy fusion  X 2  . Radical neck dissection Right ~ 1998    "throat cancer"  . Total knee arthroplasty Right 2011  . Thyroidectomy  ~ 1996 X 2  . Cataract extraction w/ intraocular lens  implant, bilateral Bilateral   . Tonsillectomy     . Excisional hemorrhoidectomy    . Joint replacement    . Back surgery    . Posterior fusion cervical spine  X 1  . Coronary angioplasty with stent placement    . Coronary artery bypass graft  1996    "CABG X3"  . Basal cell carcinoma excision      Family History  Problem Relation Age of Onset  . Leukemia Paternal Grandmother   . Cancer Maternal Grandmother     stomach  . Cancer Paternal Aunt   . Other Father     bacterial endocarditis  . Rheum arthritis Father    Social History:  reports that he quit smoking about 19 years ago. His smoking use included Cigarettes. He has a 70 pack-year smoking history. He has never used smokeless tobacco. He reports that he drinks alcohol. He reports that he does not use illicit drugs.  Allergies:  Allergies  Allergen Reactions  . Other Other (See Comments)    Dizziness, lightheadedness, Pt states that inhaled medications make him depressed and have negative thoughts.      Medications Prior to Admission  Medication Sig Dispense Refill  . amLODipine (NORVASC) 5 MG tablet Take 2.5 mg by mouth at bedtime.     Marland Kitchen aspirin EC 81 MG tablet Take 81 mg by mouth at bedtime.     Marland Kitchen atorvastatin (LIPITOR) 40 MG tablet Take 40 mg by  mouth at bedtime.    . bisacodyl (DULCOLAX) 10 MG suppository Place 1 suppository (10 mg total) rectally daily as needed for moderate constipation. 12 suppository 0  . bisoprolol (ZEBETA) 10 MG tablet Take 10 mg by mouth at bedtime.     . carbidopa-levodopa (SINEMET IR) 25-100 MG per tablet Take 3 tablets by mouth 3 (three) times daily.     . cefTRIAXone (ROCEPHIN) 40 MG/ML IVPB Inject 50 mLs (2 g total) into the vein daily. Stop after last dose on 03/30/15 50 mL 35  . clonazePAM (KLONOPIN) 0.5 MG tablet Take 0.5 mg by mouth every morning.    . fentaNYL (DURAGESIC - DOSED MCG/HR) 50 MCG/HR Place 1 patch (50 mcg total) onto the skin every 3 (three) days. 10 patch 0  . lamoTRIgine (LAMICTAL) 25 MG tablet Take 25 mg by mouth 2  (two) times daily.    Marland Kitchen levothyroxine (SYNTHROID, LEVOTHROID) 137 MCG tablet Take 137 mcg by mouth daily.    Marland Kitchen losartan (COZAAR) 100 MG tablet Take 50 mg by mouth at bedtime.     . metFORMIN (GLUCOPHAGE) 500 MG tablet Take 500 mg by mouth 2 (two) times daily.    . nitroGLYCERIN (NITROSTAT) 0.4 MG SL tablet Place 0.4 mg under the tongue every 5 (five) minutes x 3 doses as needed for chest pain.     . Nutritional Supplements (FEEDING SUPPLEMENT, JEVITY 1.5 CAL/FIBER,) LIQD Place 355 mLs into feeding tube 4 (four) times daily. 364mL = 1.5 cans 237 mL 99  . omeprazole (PRILOSEC) 20 MG capsule Take 40 mg by mouth 2 (two) times daily.    Marland Kitchen oxyCODONE-acetaminophen (PERCOCET/ROXICET) 5-325 MG per tablet Take 1 tablet by mouth every 6 (six) hours as needed for severe pain. 30 tablet 0  . PARoxetine (PAXIL) 20 MG tablet Take 20 mg by mouth 3 (three) times daily.    . tamsulosin (FLOMAX) 0.4 MG CAPS Take 0.4 mg by mouth daily.     Marland Kitchen tiZANidine (ZANAFLEX) 4 MG tablet Take 4 mg by mouth every 8 (eight) hours as needed for muscle spasms.    Marland Kitchen warfarin (COUMADIN) 4 MG tablet Take 4 mg by mouth daily at 6 PM.     . Water For Irrigation, Sterile (FREE WATER) SOLN Place 125 mLs into feeding tube 4 (four) times daily. Before AND after each tube feed administration 600 mL 0  . Artificial Tear Ointment (DRY EYES OP) Place 1 drop into both eyes daily as needed (dry eyes).    . clonazePAM (KLONOPIN) 1 MG tablet Take 1 mg by mouth at bedtime.    . docusate sodium (COLACE) 100 MG capsule Take 1 capsule (100 mg total) by mouth 2 (two) times daily. (Patient not taking: Reported on 03/06/2015) 10 capsule 0  . enoxaparin (LOVENOX) 80 MG/0.8ML injection Inject 0.8 mLs (80 mg total) into the skin every 12 (twelve) hours. (Patient not taking: Reported on 03/06/2015) 14 Syringe 0  . isosorbide dinitrate (ISORDIL) 5 MG tablet Place 3 tablets (15 mg total) into feeding tube 2 (two) times daily. (Patient not taking: Reported on  03/06/2015) 180 tablet 0  . Menthol, Topical Analgesic, (ICY HOT BACK EX) Apply 1 application topically daily as needed (back pain).    . mometasone-formoterol (DULERA) 200-5 MCG/ACT AERO Inhale 2 puffs into the lungs 2 (two) times daily. (Patient not taking: Reported on 03/06/2015) 1 Inhaler 0  . Multiple Vitamin (MULTI-VITAMINS) TABS Take 1 tablet by mouth daily. Take the same brand of multivitamins while on warfarin (  coumadin).  If multivitamin product is changed, let your provider know.  Have INR checked in 3-5 days after taking new product.    . predniSONE (DELTASONE) 20 MG tablet Take 1 tablet (20 mg total) by mouth daily with breakfast. (Patient not taking: Reported on 03/06/2015) 1 tablet 0  . tiotropium (SPIRIVA) 18 MCG inhalation capsule Place 1 capsule (18 mcg total) into inhaler and inhale daily. (Patient not taking: Reported on 03/06/2015) 30 capsule 12     ROS  BP 95/54 mmHg  Pulse 53  Temp(Src) 97.5 F (36.4 C) (Oral)  Resp 18  Ht 5\' 9"  (1.753 m)  Wt 83.462 kg (184 lb)  BMI 27.16 kg/m2  SpO2 88%   General: WD/WN  male, appears ill HEENT: PERRL, EOMI, OP dry without lesions Neck: post op changes, trachea midline; no thyromegaly Chest: normal to palpation Lungs: crackles bilaterally without wheeze or retractions Cardiac: RRR without murmurs, gallops, rubs; distal pulses 1+ Abd: Soft, nontender, nondistended, positive bowel sounds.  No organomegaly.  PEG site clear Ext: No clubbing, cyanosis.  2+ edema bilateral LE's, without Homan's Neuro: CN grossly intact. Moves all extremities, with generalized weakness Derm: solar damage without significant rashes or nodules Lymph: no cervical or supraclavicular lymphadenopathy  Assessment/Plan 1. Hypotension- hold all BP meds and add IVF; watch closely for fluid overload.  Check renal fxn, CBC for other sources 2. Epidural abscess/vertebral osteomyelitis. Change abx to zosyn to cover for recurrent aspiration pneumonia.  Repeat blood  cultures. Will ask ID to see pt. Reduce fentanyl and follow for pain control 3. AODM- cover with SSI 4. Anemia- monitoring CBC 5. CAD/CHF- holding BP meds as noted above; telemetry to monitor HR. Had recent echo 6. H/o DVT.  On chronic coumadin therapy; check INR and adjust.  Watch for bleeding 7. H/o ANCA positive lung disease.  Follow exam 8. COPD/recurrent aspiration- dietary to see regarding PEG.  All meds/food to be given by PEG 9. Parkinsons- hold meds until BP improved   BERT J KLEIN III 03/06/2015, 12:44 PM

## 2015-03-07 DIAGNOSIS — E860 Dehydration: Secondary | ICD-10-CM | POA: Diagnosis present

## 2015-03-07 DIAGNOSIS — G20 Parkinson's disease: Secondary | ICD-10-CM | POA: Diagnosis not present

## 2015-03-07 LAB — BASIC METABOLIC PANEL WITH GFR
Anion gap: 6 (ref 5–15)
BUN: 26 mg/dL — ABNORMAL HIGH (ref 6–20)
CO2: 32 mmol/L (ref 22–32)
Calcium: 8.2 mg/dL — ABNORMAL LOW (ref 8.9–10.3)
Chloride: 102 mmol/L (ref 101–111)
Creatinine, Ser: 0.68 mg/dL (ref 0.61–1.24)
GFR calc Af Amer: >60 mL/min
GFR calc non Af Amer: >60 mL/min
Glucose, Bld: 84 mg/dL (ref 65–99)
Potassium: 4.4 mmol/L (ref 3.5–5.1)
Sodium: 140 mmol/L (ref 135–145)

## 2015-03-07 LAB — VITAMIN B12: Vitamin B-12: 255 pg/mL (ref 180–914)

## 2015-03-07 LAB — PROTIME-INR
INR: 1.55
Prothrombin Time: 18.8 s — ABNORMAL HIGH (ref 11.4–15.0)

## 2015-03-07 LAB — CBC
HCT: 27.6 % — ABNORMAL LOW (ref 40.0–52.0)
HEMOGLOBIN: 8.6 g/dL — AB (ref 13.0–18.0)
MCH: 24.2 pg — AB (ref 26.0–34.0)
MCHC: 31.1 g/dL — ABNORMAL LOW (ref 32.0–36.0)
MCV: 77.6 fL — AB (ref 80.0–100.0)
Platelets: 138 10*3/uL — ABNORMAL LOW (ref 150–440)
RBC: 3.56 MIL/uL — AB (ref 4.40–5.90)
RDW: 20.9 % — ABNORMAL HIGH (ref 11.5–14.5)
WBC: 4.7 10*3/uL (ref 3.8–10.6)

## 2015-03-07 LAB — GLUCOSE, CAPILLARY
GLUCOSE-CAPILLARY: 89 mg/dL (ref 65–99)
GLUCOSE-CAPILLARY: 177 mg/dL — AB (ref 65–99)
Glucose-Capillary: 146 mg/dL — ABNORMAL HIGH (ref 65–99)
Glucose-Capillary: 104 mg/dL — ABNORMAL HIGH (ref 65–99)
Glucose-Capillary: 159 mg/dL — ABNORMAL HIGH (ref 65–99)
Glucose-Capillary: 247 mg/dL — ABNORMAL HIGH (ref 65–99)

## 2015-03-07 LAB — OCCULT BLOOD X 1 CARD TO LAB, STOOL: Fecal Occult Bld: NEGATIVE

## 2015-03-07 LAB — RETICULOCYTES
RBC.: 3.56 MIL/uL — ABNORMAL LOW (ref 4.40–5.90)
Retic Count, Absolute: 53.4 K/uL (ref 19.0–183.0)
Retic Ct Pct: 1.5 % (ref 0.4–3.1)

## 2015-03-07 LAB — IRON AND TIBC
Iron: 15 ug/dL — ABNORMAL LOW (ref 45–182)
SATURATION RATIOS: 6 % — AB (ref 17.9–39.5)
TIBC: 271 ug/dL (ref 250–450)
UIBC: 256 ug/dL

## 2015-03-07 LAB — TROPONIN I: Troponin I: 0.03 ng/mL (ref <0.031)

## 2015-03-07 LAB — FERRITIN: Ferritin: 28 ng/mL (ref 24–336)

## 2015-03-07 MED ORDER — CARBIDOPA-LEVODOPA 25-100 MG PO TABS
3.0000 | ORAL_TABLET | Freq: Three times a day (TID) | ORAL | Status: DC
  Administered 2015-03-07 – 2015-03-08 (×4): 3
  Filled 2015-03-07 (×5): qty 3

## 2015-03-07 MED ORDER — HYDROCODONE-ACETAMINOPHEN 5-325 MG PO TABS
1.0000 | ORAL_TABLET | Freq: Four times a day (QID) | ORAL | Status: DC | PRN
  Administered 2015-03-07 – 2015-03-08 (×2): 1
  Filled 2015-03-07 (×3): qty 1

## 2015-03-07 MED ORDER — PREDNISONE 20 MG PO TABS
20.0000 mg | ORAL_TABLET | Freq: Every day | ORAL | Status: DC
  Administered 2015-03-07 – 2015-03-08 (×2): 20 mg
  Filled 2015-03-07 (×2): qty 1

## 2015-03-07 MED ORDER — INSULIN ASPART 100 UNIT/ML ~~LOC~~ SOLN
0.0000 [IU] | Freq: Three times a day (TID) | SUBCUTANEOUS | Status: DC
Start: 2015-03-07 — End: 2015-03-08
  Administered 2015-03-07: 17:00:00 8 [IU] via SUBCUTANEOUS
  Administered 2015-03-07: 2 [IU] via SUBCUTANEOUS
  Filled 2015-03-07: qty 2
  Filled 2015-03-07: qty 8

## 2015-03-07 MED ORDER — WARFARIN SODIUM 5 MG PO TABS
5.0000 mg | ORAL_TABLET | Freq: Every day | ORAL | Status: DC
  Administered 2015-03-07 – 2015-03-08 (×2): 5 mg via ORAL
  Filled 2015-03-07 (×2): qty 1

## 2015-03-07 MED ORDER — ENOXAPARIN SODIUM 100 MG/ML ~~LOC~~ SOLN
1.0000 mg/kg | Freq: Two times a day (BID) | SUBCUTANEOUS | Status: DC
  Administered 2015-03-07 – 2015-03-08 (×2): 85 mg via SUBCUTANEOUS
  Filled 2015-03-07 (×2): qty 1

## 2015-03-07 MED ORDER — FENTANYL 50 MCG/HR TD PT72: 50.0000 ug | MEDICATED_PATCH | TRANSDERMAL | Status: DC

## 2015-03-07 NOTE — Consult Note (Addendum)
ANTICOAGULATION CONSULT NOTE - Follow Up Consult  Pharmacy Consult for warfarin Indication: History of DVT  Allergies  Allergen Reactions  . Other Other (See Comments)    Dizziness, lightheadedness, Pt states that inhaled medications make him depressed and have negative thoughts.      Patient Measurements: Height: 5\' 9"  (175.3 cm) Weight: 184 lb (83.462 kg) IBW/kg (Calculated) : 70.7  Vital Signs: Temp: 99 F (37.2 C) (08/31 0444) Temp Source: Oral (08/31 0444) BP: 135/74 mmHg (08/31 0444) Pulse Rate: 69 (08/31 0444)  Labs:  Recent Labs  03/06/15 1119 03/06/15 1224 03/06/15 1609 03/07/15 0054 03/07/15 0521  HGB 9.4*  --   --   --  8.6*  HCT 30.0*  --   --   --  27.6*  PLT 159  --   --   --  138*  LABPROT  --  18.5*  --   --  18.8*  INR  --  1.52  --   --  1.55  CREATININE 1.25*  --   --   --  0.68  TROPONINI <0.03  --  0.03 0.03  --     Estimated Creatinine Clearance: 87.1 mL/min (by C-G formula based on Cr of 0.68).   Medical History: Past Medical History  Diagnosis Date  . Barrett esophagus   . Hypertension   . Hyperlipidemia   . Multiple pulmonary nodules   . CHF (congestive heart failure)   . Lumbar spinal stenosis   . COPD (chronic obstructive pulmonary disease)   . CAD (coronary artery disease)   . On home oxygen therapy   . DVT (deep venous thrombosis) 11/2013; 02/01/2015  . Parkinson's disease   . Recurrent aspiration pneumonia   . Sleep apnea   . Hypothyroidism   . Type II diabetes mellitus   . Iron deficiency anemia   . GERD (gastroesophageal reflux disease)   . Seizures   . DJD (degenerative joint disease)   . Arthritis   . Chronic lower back pain   . History of gout   . Depression   . Anxiety   . Basal cell carcinoma   . Throat cancer   . Heart attack 03/20/1995    Medications:  Scheduled:  . aspirin  81 mg Per Tube Daily  . atorvastatin  40 mg Oral q1800  . carbidopa-levodopa  3 tablet Per Tube TID  . feeding supplement  (JEVITY 1.5 CAL/FIBER)  360 mL Per Tube QID  . [START ON 03/09/2015] fentaNYL  50 mcg Transdermal Q72H  . Influenza vac split quadrivalent PF  0.5 mL Intramuscular Tomorrow-1000  . insulin aspart  0-24 Units Subcutaneous TID WC  . lamoTRIgine  25 mg Per Tube Daily  . levothyroxine  137 mcg Oral QAC breakfast  . pantoprazole sodium  40 mg Per Tube BID  . PARoxetine  20 mg Per Tube Daily  . piperacillin-tazobactam (ZOSYN)  IV  3.375 g Intravenous 3 times per day  . predniSONE  20 mg Per Tube Q breakfast  . warfarin  5 mg Oral Daily  . Warfarin - Pharmacist Dosing Inpatient   Does not apply q1800    Assessment: Pt is a 69 year old male with a recent DVT.  Review of Care Everywhere shows that patient's last INR on 8/15 was 2.0 on home dosing of warfarin 5 mg po daily.   INR History: 8/30: INR - 1.52, received 7.5 mg po once 8/31: INR - 1.55  Goal of Therapy:  INR 2-3 Monitor platelets  by anticoagulation protocol: Yes   Plan:  Patient INR is subtherapeutic.  Will transition patient to home dosing of warfarin 5 mg po daily and recheck INR in AM.  Of note, patient is also receiving IV antibiotic therapy with Zosyn.  This interaction may increase the INR. Will follow closely.    Murrell Converse, PharmD Clinical Pharmacist 03/07/2015  03/07/2015,8:07 AM  Spoke with MD regarding coverage while INR is subtherapeutic, will initiate Lovenox 85 mg subq q12h based on renal function.    Murrell Converse, PharmD Clinical Pharmacist 03/07/2015

## 2015-03-07 NOTE — Progress Notes (Signed)
Patient ID: John Mendoza, male   DOB: 1946-04-05, 69 y.o.   MRN: [id number] SUBJECTIVE:   Admitted with hypotension/dehydration by labs, with progressive anemia and bilateral leg edema.  has h/o recurrent aspiration due to prior head and neck cancer treatment as well as positioning of neck due to cervical DDD, with recent PEG placement; has CAD, COPD, DM, seizure disorder, Parkinson's, h/o MAI, h/o ANCA positive/MPA with prior treatment at [facility] Responded well to IVF and holding meds; BP improved and Cr better.  CXR clear; no new cough or dyspnea.  Pain in back continues to be significant, but pain meds reduced due to hypotension.  U/a with minimal proteinuria; no hematuria; no urinary symptoms.  C/o constipation, last BM several days ago. No N/V  ______________________________________________________________________  ROS: Please see HPI; remainder of complete 10 point ROS is negative   Past Medical History  Diagnosis Date  . Barrett esophagus   . Hypertension   . Hyperlipidemia   . Multiple pulmonary nodules   . CHF (congestive heart failure)   . Lumbar spinal stenosis   . COPD (chronic obstructive pulmonary disease)   . CAD (coronary artery disease)   . On home oxygen therapy   . DVT (deep venous thrombosis) 11/2013; 02/01/2015  . Parkinson's disease   . Recurrent aspiration pneumonia   . Sleep apnea   . Hypothyroidism   . Type II diabetes mellitus   . Iron deficiency anemia   . GERD (gastroesophageal reflux disease)   . Seizures   . DJD (degenerative joint disease)   . Arthritis   . Chronic lower back pain   . History of gout   . Depression   . Anxiety   . Basal cell carcinoma   . Throat cancer   . Heart attack 03/20/1995    Past Surgical History  Procedure Laterality Date  . Anterior cervical decomp/discectomy fusion  X 2  . Radical neck dissection Right ~ 1998    "throat cancer"  . Total knee arthroplasty Right 2011  . Thyroidectomy  ~ 1996 X 2  . Cataract  extraction w/ intraocular lens  implant, bilateral Bilateral   . Tonsillectomy    . Excisional hemorrhoidectomy    . Joint replacement    . Back surgery    . Posterior fusion cervical spine  X 1  . Coronary angioplasty with stent placement    . Coronary artery bypass graft  1996    "CABG X3"  . Basal cell carcinoma excision       Current facility-administered medications:  .  acetaminophen (TYLENOL) tablet 500 mg, 500 mg, Per Tube, Q6H PRN, Tama High III, MD .  aspirin chewable tablet 81 mg, 81 mg, Per Tube, Daily, Tama High III, MD, 81 mg at 03/06/15 1045 .  atorvastatin (LIPITOR) tablet 40 mg, 40 mg, Oral, q1800, Tama High III, MD, 40 mg at 03/06/15 1622 .  clonazePAM (KLONOPIN) tablet 0.5 mg, 0.5 mg, Oral, BID PRN, Tama High III, MD, 0.5 mg at 03/06/15 2220 .  feeding supplement (JEVITY 1.5 CAL/FIBER) liquid 360 mL, 360 mL, Per Tube, QID, Tama High III, MD, 360 mL at 03/06/15 2220 .  [START ON 03/09/2015] fentaNYL (DURAGESIC - dosed mcg/hr) 50 mcg, 50 mcg, Transdermal, Q72H, Tama High III, MD .  HYDROcodone-acetaminophen (NORCO/VICODIN) 5-325 MG per tablet 1 tablet, 1 tablet, Per Tube, Q6H PRN, Tama High III, MD .  Influenza vac split quadrivalent PF (FLUARIX) injection 0.5 mL, 0.5 mL,  Intramuscular, Tomorrow-1000, Tama High III, MD .  insulin aspart (novoLOG) injection 0-24 Units, 0-24 Units, Subcutaneous, TID WC, Tama High III, MD .  lamoTRIgine (LAMICTAL) tablet 25 mg, 25 mg, Per Tube, Daily, Tama High III, MD, 25 mg at 03/06/15 1045 .  levothyroxine (SYNTHROID, LEVOTHROID) tablet 137 mcg, 137 mcg, Oral, QAC breakfast, Tama High III, MD .  morphine 2 MG/ML injection 1 mg, 1 mg, Intravenous, Q2H PRN, Tama High III, MD, 1 mg at 03/07/15 0107 .  pantoprazole sodium (PROTONIX) 40 mg/20 mL oral suspension 40 mg, 40 mg, Per Tube, BID, Tama High III, MD, 40 mg at 03/06/15 2220 .  PARoxetine (PAXIL) tablet 20 mg, 20 mg, Per Tube, Daily, Tama High III,  MD, 20 mg at 03/06/15 1045 .  piperacillin-tazobactam (ZOSYN) IVPB 3.375 g, 3.375 g, Intravenous, 3 times per day, Loleta Dicker, RPH, 3.375 g at 03/07/15 2263 .  sodium chloride 0.9 % injection 3 mL, 3 mL, Intravenous, PRN, Tama High III, MD .  traZODone (DESYREL) tablet 25 mg, 25 mg, Oral, QHS PRN, Tama High III, MD, 25 mg at 03/06/15 2220 .  warfarin (COUMADIN) tablet 5 mg, 5 mg, Oral, Daily, Crystal G Scarpena, RPH .  Warfarin - Pharmacist Dosing Inpatient, , Does not apply, q1800, Melissa D Maccia, RPH  PHYSICAL EXAM:  BP 135/74 mmHg  Pulse 69  Temp(Src) 99 F (37.2 C) (Oral)  Resp 18  Ht $R'5\' 9"'Pd$  (1.753 m)  Wt 83.462 kg (184 lb)  BMI 27.16 kg/m2  SpO2 98%  General: pleasant male, appears uncomfortable  HEENT: PERRL; OP moist without lesions.  Neck: supple, trachea midline, no thyromegaly  Chest: normal to palpation  Lungs: scattered crackles bilaterally, without retractions or wheezes  Cardiovascular: RRR, no gallop; distal pulses 2+  Abdomen: soft, nontender, slightly distended, positive bowel sounds  Extremities: no clubbing, cyanosis, edema.   Neuro: alert, moves all extremities. Chronic hoarseness.  Derm: improved skin turgor  Lymph: no cervical or supraclavicular lymphadenopathy   Labs and imaging studies were reviewed   ASSESSMENT/PLAN:  1. Hypotension/dehydration. Clinically improved; unclear if this has developed due to inadequate intake with PEG tube. Dietary has seen and intake adjusted.  Stop IVF and follow for climb in BP with all BP meds having been stopped.  2. Epidural abscess/vertebral osteomyelitis. Blood cultures pending; ID to see.  abx broadened to cover for aspiration pneumonitis, though none evident on CXR 3. DM- cont covering with SSI 4. Parkinsons- resume home regimen; watch orthostatics 5. CAD/accelerated hypertension- Has labile BP at baseline; would expect BP to begin to rise as he recovers 6. H/o DVT- On chronic coumadin; dosing as per  pharmacy 7. Anemia- progressive, with microcytosis.  Evaluation underway. Add bowel regimen; hemoccult stool 8. ANCA positive.  Minimal proteinuria present, w/o blood.  Very challenging to determine whether this plays any role, though had sig resp involvement previously and does not seem to have any now.  ESR elevated, but would expect it to be so given osteomyelitis.  Check protein/creatinine ratio and follow met b.

## 2015-03-07 NOTE — Evaluation (Signed)
Physical Therapy Evaluation Patient Details Name: John Mendoza MRN: [id number] DOB: February 28, 1946 Today's Date: 03/07/2015   History of Present Illness  69 yo male admitted 8/30 with recent aspiration PNA, recent DVT and now hypotension from dehydration.  PMHX:  CA in throat, epidural abscess and osteomyelitis in neck with DDD, Parkinson's  Clinical Impression  Pt was seen for evaluation of his functional status and to monitor his vitals to see if BP and sat sustain the activity.  He was successful although he is on 2L o2 permanently and did not have it on when PT first arrived.  Was down to 82% on room air and nursing asked PT to replace when PT asked her.    Follow Up Recommendations Home health PT    Equipment Recommendations  None recommended by PT    Recommendations for Other Services       Precautions / Restrictions Precautions Precautions: Fall Restrictions Weight Bearing Restrictions: No      Mobility  Bed Mobility Overal bed mobility: Needs Assistance Bed Mobility: Supine to Sit;Sit to Supine     Supine to sit: Mod assist Sit to supine: Supervision   General bed mobility comments: help to power up from sidelying  Transfers Overall transfer level: Needs assistance Equipment used: Rolling walker (2 wheeled);1 person hand held assist Transfers: Sit to/from [facility] Sit to Stand: Min assist Stand pivot transfers: Min guard;Min assist       General transfer comment: reminders for hand placement  Ambulation/Gait Ambulation/Gait assistance: Min assist;Min guard Ambulation Distance (Feet): 300 Feet Assistive device: Rolling walker (2 wheeled);1 person hand held assist Gait Pattern/deviations: Step-through pattern;Narrow base of support;Trunk flexed;Drifts right/left Gait velocity: redu ced Gait velocity interpretation: Below normal speed for age/gender    Stairs            Wheelchair Mobility    Modified Rankin (Stroke Patients  Only)       Balance Overall balance assessment: Needs assistance Sitting-balance support: Feet supported Sitting balance-Leahy Scale: Good   Postural control: Posterior lean Standing balance support: Bilateral upper extremity supported Standing balance-Leahy Scale: Fair Standing balance comment: fair- dynamic standing                             Pertinent Vitals/Pain Pain Assessment: No/denies pain    Home Living Family/patient expects to be discharged to:: Private residence Living Arrangements: Spouse/significant other Available Help at Discharge: Family;Available PRN/intermittently Type of Home: House Home Access: Stairs to enter Entrance Stairs-Rails: Right;Left;Can reach both Entrance Stairs-Number of Steps: 3 Home Layout: One level Home Equipment: Cane - single point;Walker - 2 wheels;Bedside commode;Shower seat;Shower seat - built in      Prior Function Level of Independence: Independent with assistive device(s)         Comments: Intermittent use of SPC "when back hurts".     Hand Dominance        Extremity/Trunk Assessment   Upper Extremity Assessment: Overall WFL for tasks assessed           Lower Extremity Assessment: Generalized weakness      Cervical / Trunk Assessment: Kyphotic  Communication   Communication: No difficulties  Cognition Arousal/Alertness: Awake/alert Behavior During Therapy: WFL for tasks assessed/performed Overall Cognitive Status: Within Functional Limits for tasks assessed                      General Comments General comments (skin integrity, edema, etc.): Has  good performance of balance and gait with increasing control as he is up longer.  Monitored vitals for note, with supine BP 104/55, sat 95% on 2L;  Sitting BP 116/63 with sat 96%;  standing 107/63 with sat 95%.      Exercises        Assessment/Plan    PT Assessment Patient needs continued PT services  PT Diagnosis Generalized weakness    PT Problem List Decreased activity tolerance;Decreased balance;Decreased mobility;Decreased coordination;Cardiopulmonary status limiting activity  PT Treatment Interventions DME instruction;Gait training;Stair training;Functional mobility training;Therapeutic activities;Therapeutic exercise;Balance training;Neuromuscular re-education;Patient/family education   PT Goals (Current goals can be found in the Care Plan section) Acute Rehab PT Goals Patient Stated Goal: to get home PT Goal Formulation: With patient Time For Goal Achievement: 03/21/15 Potential to Achieve Goals: Good    Frequency Min 2X/week   Barriers to discharge Other (comment) (continual supervision needed) son can help pt as well as wife    Co-evaluation               End of Session Equipment Utilized During Treatment: Gait belt;Oxygen Activity Tolerance: Patient tolerated treatment well;Patient limited by fatigue Patient left: in bed;with call bell/phone within reach;with bed alarm set Nurse Communication: Mobility status         Time: 9622-2979 PT Time Calculation (min) (ACUTE ONLY): 39 min   Charges:   PT Evaluation $Initial PT Evaluation Tier I: 1 Procedure PT Treatments $Gait Training: 8-22 mins $Therapeutic Exercise: 8-22 mins   PT G Codes:        Ramond Dial 03/27/2015, 10:38 AM   Mee Hives, PT MS Acute Rehab Dept. Number: [facility] [id number] and [place] [phone]

## 2015-03-07 NOTE — Consult Note (Signed)
[facility]     Reason for Consult:Vertebral osteomyelitis    Referring Physician: B. [name] Date of Admission:  03/06/2015   Principal Problem:   Orthostatic hypotension Active Problems:   Multiple pulmonary nodules   Aspiration pneumonitis   CAD (coronary artery disease)   COPD (chronic obstructive pulmonary disease)   Antineutrophil cytoplasmic antibody (ANCA) positive   Epidural abscess   Parkinsons disease   Vertebral osteomyelitis   Hypotension   Dehydration   HPI: John Mendoza is a 69 y.o. male with multiple medical problems including Good Pastures syndrome, throat cancer, recurrent aspiration PNA who had Grp B Strep Bacteremia in July complicated by vertebral discitis and osteomyelitis with epidural abscess. No aspiration was done. He has been on ceftraixone 2 gm IV q 24 and tolerating it well. However he was readmitted 8/30 with orthostatic hypotension, LE demea, dizzyness. On admit  WBC was 4, no fevers, bp improving. Abx broadened to zosyn.  BP meds have been held. BCX negative to date.  UA negative.  CXR shows possible scarring.  BUN cr ratio was 38 ESR was 76 in July 28th and 61 on 8/30.    Past Medical History  Diagnosis Date  . Barrett esophagus   . Hypertension   . Hyperlipidemia   . Multiple pulmonary nodules   . CHF (congestive heart failure)   . Lumbar spinal stenosis   . COPD (chronic obstructive pulmonary disease)   . CAD (coronary artery disease)   . On home oxygen therapy   . DVT (deep venous thrombosis) 11/2013; 02/01/2015  . Parkinson's disease   . Recurrent aspiration pneumonia   . Sleep apnea   . Hypothyroidism   . Type II diabetes mellitus   . Iron deficiency anemia   . GERD (gastroesophageal reflux disease)   . Seizures   . DJD (degenerative joint disease)   . Arthritis   . Chronic lower back pain   . History of gout   . Depression   . Anxiety   . Basal cell carcinoma   . Throat cancer   . Heart attack 03/20/1995    Past Surgical History  Procedure Laterality Date  . Anterior cervical decomp/discectomy fusion  X 2  . Radical neck dissection Right ~ 1998    "throat cancer"  . Total knee arthroplasty Right 2011  . Thyroidectomy  ~ 1996 X 2  . Cataract extraction w/ intraocular lens  implant, bilateral Bilateral   . Tonsillectomy    . Excisional hemorrhoidectomy    . Joint replacement    . Back surgery    . Posterior fusion cervical spine  X 1  . Coronary angioplasty with stent placement    . Coronary artery bypass graft  1996    "CABG X3"  . Basal cell carcinoma excision     Social History  Substance Use Topics  . Smoking status: Former Smoker -- 2.00 packs/day for 35 years    Types: Cigarettes    Quit date: 03/20/1995  . Smokeless tobacco: Never Used  . Alcohol Use: Yes     Comment: 02/01/2015 "stopped drinking in ~ 1996; never had problem w/it"   Family History  Problem Relation Age of Onset  . Leukemia Paternal Grandmother   . Cancer Maternal Grandmother     stomach  . Cancer Paternal Aunt   . Other Father     bacterial endocarditis  . Rheum arthritis Father     Allergies:  Allergies  Allergen Reactions  . Other  Other (See Comments)    Dizziness, lightheadedness, Pt states that inhaled medications make him depressed and have negative thoughts.      Current antibiotics: Antibiotics Given (last 72 hours)    Date/Time Action Medication Dose Rate   03/06/15 1159 Given   piperacillin-tazobactam (ZOSYN) IVPB 3.375 g 3.375 g 12.5 mL/hr   03/06/15 2220 Given   piperacillin-tazobactam (ZOSYN) IVPB 3.375 g 3.375 g 12.5 mL/hr   03/07/15 0639 Given   piperacillin-tazobactam (ZOSYN) IVPB 3.375 g 3.375 g 12.5 mL/hr      MEDICATIONS: . aspirin  81 mg Per Tube Daily  . atorvastatin  40 mg Oral q1800  . carbidopa-levodopa  3 tablet Per Tube TID  . feeding supplement (JEVITY 1.5 CAL/FIBER)  360 mL Per Tube QID  . [START ON 03/09/2015] fentaNYL  50 mcg Transdermal Q72H  . insulin  aspart  0-24 Units Subcutaneous TID WC  . lamoTRIgine  25 mg Per Tube Daily  . levothyroxine  137 mcg Oral QAC breakfast  . pantoprazole sodium  40 mg Per Tube BID  . PARoxetine  20 mg Per Tube Daily  . piperacillin-tazobactam (ZOSYN)  IV  3.375 g Intravenous 3 times per day  . predniSONE  20 mg Per Tube Q breakfast  . warfarin  5 mg Oral Daily  . Warfarin - Pharmacist Dosing Inpatient   Does not apply q1800    Review of Systems - 11 systems reviewed and negative per HPI   OBJECTIVE: Temp:  [97.8 F (36.6 C)-99 F (37.2 C)] 97.8 F (36.6 C) (08/31 1121) Pulse Rate:  [61-69] 61 (08/31 1126) Resp:  [18] 18 (08/31 1121) BP: (92-135)/(46-74) 96/48 mmHg (08/31 1126) SpO2:  [90 %-98 %] 96 % (08/31 1126)  General: pleasant male, appears uncomfortable  HEENT: PERRL; OP moist without lesions.  Neck: supple, trachea midline, no thyromegaly  Chest: normal to palpation  Lungs: scattered crackles bilaterally, without retractions or wheezes  Cardiovascular: RRR, no gallop; distal pulses 2+  Abdomen: soft, nontender, slightly distended, positive bowel sounds  Extremities: no clubbing, cyanosis, edema.  Neuro: alert, moves all extremities. Chronic hoarseness.  Derm: no rash Lymph: no cervical or supraclavicular lymphadenopathy    LABS: Results for orders placed or performed during the hospital encounter of 03/06/15 (from the past 48 hour(s))  Comprehensive metabolic panel     Status: Abnormal   Collection Time: 03/06/15 11:19 AM  Result Value Ref Range   Sodium 136 135 - 145 mmol/L   Potassium 4.6 3.5 - 5.1 mmol/L   Chloride 97 (L) 101 - 111 mmol/L   CO2 32 22 - 32 mmol/L   Glucose, Bld 111 (H) 65 - 99 mg/dL   BUN 33 (H) 6 - 20 mg/dL   Creatinine, Ser 1.25 (H) 0.61 - 1.24 mg/dL   Calcium 8.8 (L) 8.9 - 10.3 mg/dL   Total Protein 6.7 6.5 - 8.1 g/dL   Albumin 3.3 (L) 3.5 - 5.0 g/dL   AST 21 15 - 41 U/L   ALT 23 17 - 63 U/L   Alkaline Phosphatase 69 38 - 126 U/L   Total  Bilirubin 0.3 0.3 - 1.2 mg/dL   GFR calc non Af Amer 57 (L) >60 mL/min   GFR calc Af Amer >60 >60 mL/min    Comment: (NOTE) The eGFR has been calculated using the CKD EPI equation. This calculation has not been validated in all clinical situations. eGFR's persistently <60 mL/min signify possible Chronic Kidney Disease.    Anion gap 7 5 -  15  CBC WITH DIFFERENTIAL     Status: Abnormal   Collection Time: 03/06/15 11:19 AM  Result Value Ref Range   WBC 4.6 3.8 - 10.6 K/uL   RBC 3.85 (L) 4.40 - 5.90 MIL/uL   Hemoglobin 9.4 (L) 13.0 - 18.0 g/dL   HCT 30.0 (L) 40.0 - 52.0 %   MCV 78.0 (L) 80.0 - 100.0 fL   MCH 24.4 (L) 26.0 - 34.0 pg   MCHC 31.3 (L) 32.0 - 36.0 g/dL   RDW 20.8 (H) 11.5 - 14.5 %   Platelets 159 150 - 440 K/uL   Neutrophils Relative % 71 %   Neutro Abs 3.3 1.4 - 6.5 K/uL   Lymphocytes Relative 9 %   Lymphs Abs 0.4 (L) 1.0 - 3.6 K/uL   Monocytes Relative 16 %   Monocytes Absolute 0.7 0.2 - 1.0 K/uL   Eosinophils Relative 3 %   Eosinophils Absolute 0.1 0 - 0.7 K/uL   Basophils Relative 1 %   Basophils Absolute 0.0 0 - 0.1 K/uL  TSH     Status: None   Collection Time: 03/06/15 11:19 AM  Result Value Ref Range   TSH 3.101 0.350 - 4.500 uIU/mL  Troponin I     Status: None   Collection Time: 03/06/15 11:19 AM  Result Value Ref Range   Troponin I <0.03 <0.031 ng/mL    Comment:        NO INDICATION OF MYOCARDIAL INJURY.   Cortisol     Status: None   Collection Time: 03/06/15 11:19 AM  Result Value Ref Range   Cortisol, Plasma 10.5 ug/dL    Comment: (NOTE) AM    6.7 - 22.6 ug/dL PM   <10.0       ug/dL Performed at [facility]   Sedimentation rate     Status: Abnormal   Collection Time: 03/06/15 11:19 AM  Result Value Ref Range   Sed Rate 61 (H) 0 - 16 mm/hr  Glucose, capillary     Status: Abnormal   Collection Time: 03/06/15 12:07 PM  Result Value Ref Range   Glucose-Capillary 102 (H) 65 - 99 mg/dL   Comment 1 Notify RN   Protime-INR     Status:  Abnormal   Collection Time: 03/06/15 12:24 PM  Result Value Ref Range   Prothrombin Time 18.5 (H) 11.4 - 15.0 seconds   INR 1.52   Urine culture     Status: None (Preliminary result)   Collection Time: 03/06/15  2:50 PM  Result Value Ref Range   Specimen Description URINE, RANDOM    Special Requests NONE    Culture NO GROWTH < 24 HOURS    Report Status PENDING   Urinalysis complete, with microscopic ([facility] only)     Status: Abnormal   Collection Time: 03/06/15  2:50 PM  Result Value Ref Range   Color, Urine YELLOW (A) YELLOW   APPearance CLEAR (A) CLEAR   Glucose, UA NEGATIVE NEGATIVE mg/dL   Bilirubin Urine NEGATIVE NEGATIVE   Ketones, ur NEGATIVE NEGATIVE mg/dL   Specific Gravity, Urine 1.019 1.005 - 1.030   Hgb urine dipstick NEGATIVE NEGATIVE   pH 7.0 5.0 - 8.0   Protein, ur 30 (A) NEGATIVE mg/dL   Nitrite NEGATIVE NEGATIVE   Leukocytes, UA 1+ (A) NEGATIVE   RBC / HPF 0-5 0 - 5 RBC/hpf   WBC, UA 0-5 0 - 5 WBC/hpf   Bacteria, UA NONE SEEN NONE SEEN   Squamous Epithelial / LPF NONE  SEEN NONE SEEN   Mucous PRESENT    Hyaline Casts, UA PRESENT   Troponin I     Status: None   Collection Time: 03/06/15  4:09 PM  Result Value Ref Range   Troponin I 0.03 <0.031 ng/mL    Comment:        NO INDICATION OF MYOCARDIAL INJURY.   Glucose, capillary     Status: None   Collection Time: 03/06/15  4:14 PM  Result Value Ref Range   Glucose-Capillary 66 65 - 99 mg/dL   Comment 1 Notify RN   Glucose, capillary     Status: None   Collection Time: 03/06/15  5:01 PM  Result Value Ref Range   Glucose-Capillary 89 65 - 99 mg/dL   Comment 1 Notify RN   Glucose, capillary     Status: Abnormal   Collection Time: 03/06/15  9:20 PM  Result Value Ref Range   Glucose-Capillary 171 (H) 65 - 99 mg/dL   Comment 1 Notify RN   Troponin I     Status: None   Collection Time: 03/07/15 12:54 AM  Result Value Ref Range   Troponin I 0.03 <0.031 ng/mL    Comment:        NO INDICATION OF MYOCARDIAL  INJURY.   Glucose, capillary     Status: Abnormal   Collection Time: 03/07/15  1:06 AM  Result Value Ref Range   Glucose-Capillary 146 (H) 65 - 99 mg/dL   Comment 1 Notify RN   Glucose, capillary     Status: None   Collection Time: 03/07/15  4:42 AM  Result Value Ref Range   Glucose-Capillary 89 65 - 99 mg/dL   Comment 1 Notify RN   Basic metabolic panel     Status: Abnormal   Collection Time: 03/07/15  5:21 AM  Result Value Ref Range   Sodium 140 135 - 145 mmol/L   Potassium 4.4 3.5 - 5.1 mmol/L   Chloride 102 101 - 111 mmol/L   CO2 32 22 - 32 mmol/L   Glucose, Bld 84 65 - 99 mg/dL   BUN 26 (H) 6 - 20 mg/dL   Creatinine, Ser 0.68 0.61 - 1.24 mg/dL   Calcium 8.2 (L) 8.9 - 10.3 mg/dL   GFR calc non Af Amer >60 >60 mL/min   GFR calc Af Amer >60 >60 mL/min    Comment: (NOTE) The eGFR has been calculated using the CKD EPI equation. This calculation has not been validated in all clinical situations. eGFR's persistently <60 mL/min signify possible Chronic Kidney Disease.    Anion gap 6 5 - 15  CBC     Status: Abnormal   Collection Time: 03/07/15  5:21 AM  Result Value Ref Range   WBC 4.7 3.8 - 10.6 K/uL   RBC 3.56 (L) 4.40 - 5.90 MIL/uL   Hemoglobin 8.6 (L) 13.0 - 18.0 g/dL   HCT 27.6 (L) 40.0 - 52.0 %   MCV 77.6 (L) 80.0 - 100.0 fL   MCH 24.2 (L) 26.0 - 34.0 pg   MCHC 31.1 (L) 32.0 - 36.0 g/dL   RDW 20.9 (H) 11.5 - 14.5 %   Platelets 138 (L) 150 - 440 K/uL  Protime-INR     Status: Abnormal   Collection Time: 03/07/15  5:21 AM  Result Value Ref Range   Prothrombin Time 18.8 (H) 11.4 - 15.0 seconds   INR 1.55   Ferritin     Status: None   Collection Time: 03/07/15  5:21 AM  Result Value Ref Range   Ferritin 28 24 - 336 ng/mL  Iron and TIBC     Status: Abnormal   Collection Time: 03/07/15  5:21 AM  Result Value Ref Range   Iron 15 (L) 45 - 182 ug/dL   TIBC 271 250 - 450 ug/dL   Saturation Ratios 6 (L) 17.9 - 39.5 %   UIBC 256 ug/dL  Reticulocytes     Status:  Abnormal   Collection Time: 03/07/15  5:21 AM  Result Value Ref Range   Retic Ct Pct 1.5 0.4 - 3.1 %   RBC. 3.56 (L) 4.40 - 5.90 MIL/uL   Retic Count, Manual 53.4 19.0 - 183.0 K/uL  Glucose, capillary     Status: Abnormal   Collection Time: 03/07/15  9:35 AM  Result Value Ref Range   Glucose-Capillary 104 (H) 65 - 99 mg/dL  Glucose, capillary     Status: Abnormal   Collection Time: 03/07/15 11:17 AM  Result Value Ref Range   Glucose-Capillary 159 (H) 65 - 99 mg/dL   Comment 1 Notify RN    No components found for: ESR, C REACTIVE PROTEIN MICRO: Recent Results (from the past 720 hour(s))  Urine culture     Status: None (Preliminary result)   Collection Time: 03/06/15  2:50 PM  Result Value Ref Range Status   Specimen Description URINE, RANDOM  Final   Special Requests NONE  Final   Culture NO GROWTH < 24 HOURS  Final   Report Status PENDING  Incomplete    IMAGING: X-ray Chest Pa And Lateral  03/06/2015   CLINICAL DATA:  Hypotension.  Recurrent aspiration pneumonia.  EXAM: CHEST  2 VIEW  COMPARISON:  01/29/2015  FINDINGS: Right PICC line is in place with the tip in the SVC. Prior CABG. There is cardiomegaly. Linear densities in the lingula and right lung base likely root reflect scarring. Note definite acute airspace opacity. No effusion. No acute bony abnormality.  IMPRESSION: Lingular and right basilar linear densities likely reflects scarring. No definite acute process.  Cardiomegaly.   Electronically Signed   By: Rolm Baptise M.D.   On: 03/06/2015 15:34   Dg Abd 1 View  02/06/2015   CLINICAL DATA:  Constipation.  Evaluate for PEG tube placement.  EXAM: ABDOMEN - 1 VIEW  COMPARISON:  02/05/2015  FINDINGS: There is contrast in nondilated loops of colon. Significant stool burden noted. No evidence for large or small bowel obstruction. Degenerative changes seen in the lower thoracic and lumbar spine.  IMPRESSION: Significant retained contrast in the colon. Significant stool burden.    Electronically Signed   By: Nolon Nations M.D.   On: 02/06/2015 11:35   Ir Gastrostomy Tube Mod Sed  02/06/2015   CLINICAL DATA:  Dysphagia, head neck cancer, status post radiation, failed swallowing exam  EXAM: FLUOROSCOPIC 20 FRENCH PULL-THROUGH GASTROSTOMY  Date:  8/2/20168/08/2014 10:47 am  Radiologist:  M. Daryll Brod, MD  Guidance:  Fluoroscopic  FLUOROSCOPY TIME:  1 minutes 54 seconds, 60 mGy  MEDICATIONS AND MEDICAL HISTORY: Patient is already receiving Rocephin IV daily.1 mg Versed, 50 mcg fentanyl  ANESTHESIA/SEDATION: 7 minutes  CONTRAST:  104m OMNIPAQUE IOHEXOL 300 MG/ML  SOLN  COMPLICATIONS: None immediate  PROCEDURE: Informed consent was obtained from the patient following explanation of the procedure, risks, benefits and alternatives. The patient understands, agrees and consents for the procedure. All questions were addressed. A time out was performed.  Maximal barrier sterile technique utilized including caps, mask, sterile gowns, sterile gloves,  large sterile drape, hand hygiene, and betadine prep.  The left upper quadrant was sterilely prepped and draped. An oral gastric catheter was inserted into the stomach under fluoroscopy. The existing nasogastric feeding tube was removed. Air was injected into the stomach for insufflation and visualization under fluoroscopy. The air distended stomach was confirmed beneath the anterior abdominal wall in the frontal and lateral projections. Under sterile conditions and local anesthesia, a 46 gauge trocar needle was utilized to access the stomach percutaneously beneath the left subcostal margin. Needle position was confirmed within the stomach under biplane fluoroscopy. Contrast injection confirmed position also. A single T tack was deployed for gastropexy. Over an Amplatz guide wire, a 9-French sheath was inserted into the stomach. A snare device was utilized to capture the oral gastric catheter. The snare device was pulled retrograde from the stomach up  the esophagus and out the oropharynx. The 20-French pull-through gastrostomy was connected to the snare device and pulled antegrade through the oropharynx down the esophagus into the stomach and then through the percutaneous tract external to the patient. The gastrostomy was assembled externally. Contrast injection confirms position in the stomach. Images were obtained for documentation. The patient tolerated procedure well. No immediate complication.  IMPRESSION: Fluoroscopic insertion of a 20-French "pull-through" gastrostomy.   Electronically Signed   By: Jerilynn Mages.  Shick M.D.   On: 02/06/2015 13:08    Assessment:   John Mendoza is a 69 y.o. male with multiple medical problems including Good Pastures syndrome, throat cancer, recurrent aspiration PNA who had Grp B Strep Bacteremia in July complicated by vertebral discitis and osteomyelitis with epidural abscess. No aspiration was done. He has been on ceftraixone 2 gm IV q 24 and tolerating it well. However he was readmitted 8/30 with orthostatic hypotension, LE demea, dizzyness. On admit  WBC was 4, no fevers, bp improving. Abx broadened to zosyn.  BP meds have been held. BCX negative to date.  UA negative.  CXR shows possible scarring.  BUN cr ratio was 38 ESR was 76 in July 28th and 61 on 8/30. I suspect he is dehydrated as the cause of his hypotension. He has no evidence of worsening infection at this time.  Recommendations Would restart ceftriaxone at dc to complete course If develops neuro s/sxs or fevers, recurrent bacteremia would repeat MRI  Thank you very much for allowing me to participate in the care of this patient. Please call with questions.   Cheral Marker. Ola Spurr, MD

## 2015-03-07 NOTE — Progress Notes (Signed)
Nutrition Follow-up   INTERVENTION:   EN: Spoke with RN Malachy Mood this am to aid pt with feeding. Per MD note, IVF to be discontinued. Per pt preference, will order free water 139mL before and after each bolus feeding for free water. Total free water with water in TF equals 2054mL daily. Will adjust accordingly, pending lab work and UOP. RD notes pt sucking on mouth swabs the entire visit with a cup of water in room.    NUTRITION DIAGNOSIS:   Swallowing difficulty related to chronic illness as evidenced by  (PEG used for enteral nutrition).  GOAL:   Patient will meet greater than or equal to 90% of their needs; ongoing, met with tube feeding at goal  MONITOR:    (Energy Intake, Electrolyte and renal Profile, Digestive system, anthropometrics)   ASSESSMENT:   Pt admitted with orthostatic hypotension. Pt with h/o dysphagia, PEG placed last month for enteral nutrition.   Diet Order:  Diet NPO time specified   Current Nutrition: Pt reports giving himself a feeding last night around 11pm with 2 syringes full of water before and after the feeding and tolerating well. On visit, pt had not gotten first feeding today. RD notes pt declined noon feeding per documentation.   Gastrointestinal Profile: Last BM: unknown   Medications: Protonix, IVF d/c  Electrolyte/Renal Profile and Glucose Profile:   Recent Labs Lab 03/06/15 1119 03/07/15 0521  NA 136 140  K 4.6 4.4  CL 97* 102  CO2 32 32  BUN 33* 26*  CREATININE 1.25* 0.68  CALCIUM 8.8* 8.2*  GLUCOSE 111* 84   Protein Profile:  Recent Labs Lab 03/06/15 1119  ALBUMIN 3.3*     Weight Trend since Admission: Filed Weights   03/06/15 1109  Weight: 184 lb (83.462 kg)     Skin:  Reviewed, no issues   BMI:  Body mass index is 27.16 kg/(m^2).  Estimated Nutritional Needs:   Kcal:  1975-8832PQDIY, BEE: 1585kcals, TEE: (IF 1.1-1.3)(AF 1.2)   Protein:  84-100g protein (1.0-1.2g/kg)   Fluid:  2087-2576mL of fluid  (25-49mL/kg)  EDUCATION NEEDS:   No education needs identified at this time   [place], RD, LDN Pager [phone]

## 2015-03-07 NOTE — Progress Notes (Signed)
Pt to transfer to rm 104, report called to [place].  [facility] will transport patient

## 2015-03-07 NOTE — Care Management (Signed)
Patient has had a recent admission to [facility] with subsequent transfer to [facility] for vertebral osteomyelitis . Had Peg tube insertion during that stay for chronic aspiration.  Patient does have parkinsons.  Discharged home on 8/4.   He is followed by [place].  He is currently receiving home IV antbiotic therapy with last day of therapy 9/23.  He was also on bolus tube feedings.  He was directly admitted from MD office for hypotension.  There is concern this maybe due to some dehydration due to fluid intake?  It is verbally reported that patient had said sometimes he does not take his feedings.   [facility] informed of admission.  [facility] is providing the Jevity and Rocephin.

## 2015-03-07 NOTE — Plan of Care (Signed)
Problem: Discharge Progression Outcomes Goal: Other Discharge Outcomes/Goals Outcome: Progressing Patient c/o lower back pain x1 relieved by prn pain meds VSS  Patient was a recent admission to [facility] with subsequent transfer to [facility] for vertebral osteomyelitis followed with [facility] SN, PT and Aide Peg tube intake and functioning no residual Continued on IV  ABX Continues with bolus feeds x4 per day

## 2015-03-08 LAB — BASIC METABOLIC PANEL
ANION GAP: 8 (ref 5–15)
BUN: 22 mg/dL — ABNORMAL HIGH (ref 6–20)
CHLORIDE: 102 mmol/L (ref 101–111)
CO2: 32 mmol/L (ref 22–32)
Calcium: 8.7 mg/dL — ABNORMAL LOW (ref 8.9–10.3)
Creatinine, Ser: 0.68 mg/dL (ref 0.61–1.24)
GFR calc Af Amer: >60 mL/min (ref >60)
GFR calc non Af Amer: >60 mL/min (ref >60)
GLUCOSE: 127 mg/dL — AB (ref 65–99)
POTASSIUM: 4.8 mmol/L (ref 3.5–5.1)
Sodium: 142 mmol/L (ref 135–145)

## 2015-03-08 LAB — MICROALBUMIN / CREATININE URINE RATIO
Creatinine, Urine: 105.8 mg/dL
Microalb Creat Ratio: 26.5 mg/g creat (ref 0.0–30.0)
Microalb, Ur: 28 ug/mL — ABNORMAL HIGH

## 2015-03-08 LAB — URINE CULTURE: Culture: 1000

## 2015-03-08 LAB — CBC
HEMATOCRIT: 32.8 % — AB (ref 40.0–52.0)
HEMOGLOBIN: 10.2 g/dL — AB (ref 13.0–18.0)
MCH: 24.4 pg — ABNORMAL LOW (ref 26.0–34.0)
MCHC: 31.2 g/dL — ABNORMAL LOW (ref 32.0–36.0)
MCV: 78 fL — AB (ref 80.0–100.0)
Platelets: 167 10*3/uL (ref 150–440)
RBC: 4.21 MIL/uL — AB (ref 4.40–5.90)
RDW: 20.6 % — AB (ref 11.5–14.5)
WBC: 4.9 10*3/uL (ref 3.8–10.6)

## 2015-03-08 LAB — PROTIME-INR
INR: 1.69
Prothrombin Time: 20.1 seconds — ABNORMAL HIGH (ref 11.4–15.0)

## 2015-03-08 LAB — GLUCOSE, CAPILLARY: GLUCOSE-CAPILLARY: 106 mg/dL — AB (ref 65–99)

## 2015-03-08 MED ORDER — LOSARTAN POTASSIUM 25 MG PO TABS: 25.0000 mg | ORAL_TABLET | Freq: Every day | ORAL | Status: DC

## 2015-03-08 MED ORDER — WARFARIN SODIUM 5 MG PO TABS: 5.0000 mg | ORAL_TABLET | Freq: Every day | ORAL | Status: DC

## 2015-03-08 MED ORDER — OMEPRAZOLE 2 MG/ML ORAL SUSPENSION: 20.0000 mg | Freq: Two times a day (BID) | ORAL | Status: DC

## 2015-03-08 MED ORDER — LOSARTAN POTASSIUM 25 MG PO TABS
25.0000 mg | ORAL_TABLET | Freq: Every day | ORAL | Status: DC
  Filled 2015-03-08: qty 1

## 2015-03-08 MED ORDER — DEXTROSE 5 % IV SOLN
1.0000 g | INTRAVENOUS | Status: DC
  Administered 2015-03-08: 1 g via INTRAVENOUS
  Filled 2015-03-08 (×2): qty 10

## 2015-03-08 NOTE — Care Management (Signed)
Discharge to home today per Dr. Ramonita Lab. Spoke with Jamey Ripa at [place].  Resumption of home health orders faxed to [facility] [phone]).  Mr. Ebel updated. Family will transport. Shelbie Ammons RN MSN Care Management [phone]

## 2015-03-08 NOTE — Progress Notes (Signed)
Brief Nutrition Follow-up:  EN: Pt reports tolerating TF well again this am with free water flushes. RD provided "Tube Feeding at Home" booklet created by [employer] Booklet provides a manual for tube feeding at home including instruction, tips and tools of administering TF via PEG. RD educated on topics in booklet such as signs and symptoms of dehydration, prevention of dehydration, ways to reduce risk of infection, and ways to address different GI symptoms. RD answered all questions and helped pt with quantities of free water. Teach back method used.  Expect very good compliance. Pt appreciative for tube feeding manual.   Pt plan for discharge today, to follow-up with Dr. [name] next week per pt.  Dwyane Luo, [place], LDN Pager [phone]

## 2015-03-08 NOTE — Care Management Important Message (Signed)
Important Message  Patient Details  Name: John Mendoza MRN: [id number] Date of Birth: Nov 28, 1945   Medicare Important Message Given:  Yes-second notification given    [name] 03/08/2015, 10:26 AM

## 2015-03-08 NOTE — Plan of Care (Signed)
Problem: Discharge Progression Outcomes Goal: Other Discharge Outcomes/Goals Outcome: Adequate for Discharge Patient c/o lower back pain x1 relieved well with prn Norco Patient is tolerating tube feeding well with no residual this shift VSS, no orthostatic hypotension noted  Received MD order to discharge patient to home Reviewed home meds, prescriptions and follow up appointments and discharge instructions with patient and patient verbalized understanding Discharged in wheelchair with volunteer and wife to home

## 2015-03-08 NOTE — Discharge Summary (Signed)
Physician Discharge Summary  Patient ID: John Mendoza MRN: [id number] DOB/AGE: 11-Oct-1945 69 y.o.  Admit date: 03/06/2015 Discharge date: 03/08/2015  Admission Diagnoses:  Discharge Diagnoses:  Principal Problem:   Orthostatic hypotension Active Problems:   Multiple pulmonary nodules   Aspiration pneumonitis   CAD (coronary artery disease)   COPD (chronic obstructive pulmonary disease)   Antineutrophil cytoplasmic antibody (ANCA) positive   Epidural abscess   Parkinsons disease   Vertebral osteomyelitis   Hypotension   Dehydration   Discharged Condition: stable  Mendoza Course: admitted with hypotension and evidence of dehydration by labs and exam. Started on IVF with prompt improvement.  BP meds held, with BP improving over course of hospitalization.  Orthostatics followed and this improved after IVF and med adjustments.  Ambulated with PT and appeared close to level of functioning prior to hospitalization  Pt recently found to have L4-5 discitis, vertebral osteomyelitis, epidural abscess.  Evaluated by ID and neurosurgery at [facility] and on IV rocephin through 04/04/15. PICC in place.  Was changed to zosyn initially out of concern for aspiration, but CXR negative. ID saw pt; will resume rocephin.  Back pain appears to be improving and no new neuro deficits, and so MRI not repeated at this time.  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  Had PEG placed at [facility] 7/16.  Dietary saw pt and PEG intake adjusted.  Pt has been evaluated repeatedly by SLP in past; there are no safe liquids that pt is able to take PO, and he encouraged to take everything by PEG  Has had significant pain due abscess; pain meds adjusted with reasonable control.  Bowel regimen was effective during this hospitalization  Has h/o DVT; on lifelong coumadin; meds adjusted and this will need to be followed closely.  Currently on 5  mg coumadin daily  Has complicated h/o with prior hemoptysis, in part due to aspiration.  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  ANCA negative at that time and no renal involvement.  Went ot [facility] for further evaluation,  Where anti GBM was NEGATIVE and ANCA was POSITIVE.  No clear reason for this unusual change in antibody findings.  Still with no renal findings, but s/p IV cyclophosphamide and on chronic prednisone for this reason.  COPD stable; doesn't use inhalers regularly, but encouraged to do so. Has chronic resp failure and is maintained on 2L [facility] continuously.  Parkinson's stable on his home regimen.  Has not had seizure in many years and is maintained on low dose Lamictal.  No cardiac symptoms noted during hospitalization and continued on home meds except BP meds.   D/c home in stable condition; up with walker with oxygen.  HH PT, SLP, RN ordered.  Cont abx through 9/28.  Diet will be Jevity 1.5 cans 4 times a day, with 125 ml water flushes before and after each feeding    Discharge Exam: Blood pressure 143/70, pulse 58, temperature 97.7 F (36.5 C), temperature source Oral, resp. rate 18, height 5\' 9"  (1.753 m), weight 83.462 kg (184 lb), SpO2 97 %.   Disposition: 06-Home-Health Care Svc      Discharge Instructions    Ambulatory referral to Home Health    Complete by:  As directed   Please evaluate John M[facility]za for admission to [facility].  Disciplines requested:  Nursing, Physical Therapy and Speech Language Pathology  Services to provide: IV Antibiotics  Physician to follow patient's care (the person listed here will be responsible for signing ongoing orders): PCP  Requested Start of Care Date: Tomorrow  I certify that this patient is under my care and that I, or a Nurse Practitioner or Physician's Assistant working with me, had a face-to-face encounter that meets the  physician face-to-face requirements with patient on 03/08/15. The encounter with the patient was in whole, or in part for the following medical condition(s) which is the primary reason for home health care (List medical condition). Dehydration, epidural abscess, aspiration/dysphagia  Special Instructions:  PICC line care per protocol  Does the patient have Medicare or Medicaid?:  Yes  The encounter with the patient was in whole, or in part, for the following medical condition, which is the primary reason for home health care:  dehydration, vertrebral osteomyelitis, epidural abscess  Reason for Medically Necessary Home Health Services:  Skilled Nursing- Change/Decline in Patient Status  My clinical findings support the need for the above services:  Pain interferes with ambulation/mobility  I certify that, based on my findings, the following services are medically necessary home health services:   Nursing Physical therapy Speech language pathology    Further, I certify that my clinical findings support that this patient is homebound due to:  Unable to leave home safely without assistance     Diet - low sodium heart healthy    Complete by:  As directed      Discharge instructions    Complete by:  As directed   Monitor and record weight, BP, and glucose daily     Increase activity slowly    Complete by:  As directed   With rolling walker and oxygen 2L; up as tolerated            Medication List    STOP taking these medications        amLODipine 5 MG tablet  Commonly known as:  NORVASC     bisoprolol 10 MG tablet  Commonly known as:  ZEBETA     enoxaparin 80 MG/0.8ML injection  Commonly known as:  LOVENOX     ICY HOT BACK EX     isosorbide dinitrate 5 MG tablet  Commonly known as:  ISORDIL     omeprazole 20 MG capsule  Commonly known as:  PRILOSEC  Replaced by:  omeprazole 2 mg/mL Susp     tiotropium 18 MCG inhalation capsule  Commonly known as:  SPIRIVA     tiZANidine 4  MG tablet  Commonly known as:  ZANAFLEX      TAKE these medications        aspirin EC 81 MG tablet  Take 81 mg by mouth at bedtime.     atorvastatin 40 MG tablet  Commonly known as:  LIPITOR  Take 40 mg by mouth at bedtime.     bisacodyl 10 MG suppository  Commonly known as:  DULCOLAX  Place 1 suppository (10 mg total) rectally daily as needed for moderate constipation.     carbidopa-levodopa 25-100 MG per tablet  Commonly known as:  SINEMET IR  Take 3 tablets by mouth 3 (three) times daily.     cefTRIAXone 40 MG/ML IVPB  Commonly known as:  ROCEPHIN  Inject 50 mLs (2 g total) into the vein daily. Stop after last dose on 03/30/15     clonazePAM 0.5 MG tablet  Commonly known as:  [employer]  Take 0.5 mg by mouth every morning.     clonazePAM 1 MG tablet  Commonly known as:  KLONOPIN  Take 1 mg by mouth at bedtime.     docusate sodium 100 MG capsule  Commonly known as:  COLACE  Take 1 capsule (100 mg total) by mouth 2 (two) times daily.     DRY EYES OP  Place 1 drop into both eyes daily as needed (dry eyes).     feeding supplement (JEVITY 1.5 CAL/FIBER) Liqd  Place 355 mLs into feeding tube 4 (four) times daily. 368mL = 1.5 cans     fentaNYL 50 MCG/HR  Commonly known as:  DURAGESIC - dosed mcg/hr  Place 1 patch (50 mcg total) onto the skin every 3 (three) days.     free water Soln  Place 125 mLs into feeding tube 4 (four) times daily. Before AND after each tube feed administration     lamoTRIgine 25 MG tablet  Commonly known as:  LAMICTAL  Take 25 mg by mouth 2 (two) times daily.     levothyroxine 137 MCG tablet  Commonly known as:  SYNTHROID, LEVOTHROID  Take 137 mcg by mouth daily.     losartan 25 MG tablet  Commonly known as:  COZAAR  Take 1 tablet (25 mg total) by mouth daily.     metFORMIN 500 MG tablet  Commonly known as:  GLUCOPHAGE  Take 500 mg by mouth 2 (two) times daily.     mometasone-formoterol 200-5 MCG/ACT Aero  Commonly known as:  DULERA   Inhale 2 puffs into the lungs 2 (two) times daily.     MULTI-VITAMINS Tabs  Take 1 tablet by mouth daily. Take the same brand of multivitamins while on warfarin (coumadin).  If multivitamin product is changed, let your provider know.  Have INR checked in 3-5 days after taking new product.     nitroGLYCERIN 0.4 MG SL tablet  Commonly known as:  NITROSTAT  Place 0.4 mg under the tongue every 5 (five) minutes x 3 doses as needed for chest pain.     omeprazole 2 mg/mL Susp  Commonly known as:  PRILOSEC  Place 10 mLs (20 mg total) into feeding tube 2 (two) times daily before a meal.     oxyCODONE-acetaminophen 5-325 MG per tablet  Commonly known as:  PERCOCET/ROXICET  Take 1 tablet by mouth every 6 (six) hours as needed for severe pain.     PARoxetine 20 MG tablet  Commonly known as:  PAXIL  Take 20 mg by mouth 3 (three) times daily.     predniSONE 20 MG tablet  Commonly known as:  DELTASONE  Take 1 tablet (20 mg total) by mouth daily with breakfast.     tamsulosin 0.4 MG Caps capsule  Commonly known as:  FLOMAX  Take 0.4 mg by mouth daily.     warfarin 5 MG tablet  Commonly known as:  COUMADIN  Take 1 tablet (5 mg total) by mouth daily.       Follow-up Information    Follow up with Jquan Egelston Briscoe Burns III, MD In 1 week.   Specialty:  Internal Medicine   Contact information:   [place] [address] [phone]       Signed: Tama High III 03/08/2015, 8:22 AM

## 2015-03-08 NOTE — Plan of Care (Signed)
Problem: Discharge Progression Outcomes Goal: Other Discharge Outcomes/Goals Outcome: Progressing Plan of Care Progress to Goal:   Pt is alert and oriented. Pt has a PEG tube which he uses for food and meds. Pt getting jevity bolus feeds. Stool sample was negative  For C. Diff. Wears 2 nasal cannula PRN. No other signs of distress noted. Will continue to monitor.

## 2015-04-20 ENCOUNTER — Other Ambulatory Visit: Payer: Self-pay | Admitting: Infectious Diseases

## 2015-04-20 ENCOUNTER — Ambulatory Visit
Admission: RE | Admit: 2015-04-20 | Discharge: 2015-04-20 | Disposition: A | Discharge status: Home / Self Care | Payer: Medicare Other | Source: Ambulatory Visit | Attending: Infectious Diseases | Admitting: Infectious Diseases

## 2015-04-20 DIAGNOSIS — G319 Degenerative disease of nervous system, unspecified: Secondary | ICD-10-CM | POA: Insufficient documentation

## 2015-04-20 DIAGNOSIS — S0990XA Unspecified injury of head, initial encounter: Secondary | ICD-10-CM | POA: Insufficient documentation

## 2015-04-20 DIAGNOSIS — Z7901 Long term (current) use of anticoagulants: Secondary | ICD-10-CM | POA: Insufficient documentation

## 2015-04-20 DIAGNOSIS — X58XXXA Exposure to other specified factors, initial encounter: Secondary | ICD-10-CM | POA: Diagnosis not present

## 2015-04-20 DIAGNOSIS — M4620 Osteomyelitis of vertebra, site unspecified: Secondary | ICD-10-CM

## 2015-04-20 DIAGNOSIS — G062 Extradural and subdural abscess, unspecified: Secondary | ICD-10-CM

## 2015-04-23 ENCOUNTER — Ambulatory Visit
Admission: RE | Admit: 2015-04-23 | Discharge: 2015-04-23 | Disposition: A | Discharge status: Home / Self Care | Payer: Medicare Other | Source: Ambulatory Visit | Attending: Infectious Diseases | Admitting: Infectious Diseases

## 2015-04-23 DIAGNOSIS — M4620 Osteomyelitis of vertebra, site unspecified: Secondary | ICD-10-CM

## 2015-04-23 DIAGNOSIS — M4626 Osteomyelitis of vertebra, lumbar region: Secondary | ICD-10-CM | POA: Diagnosis not present

## 2015-04-23 DIAGNOSIS — G062 Extradural and subdural abscess, unspecified: Secondary | ICD-10-CM

## 2015-04-23 DIAGNOSIS — G061 Intraspinal abscess and granuloma: Secondary | ICD-10-CM | POA: Diagnosis not present

## 2015-04-23 DIAGNOSIS — M4646 Discitis, unspecified, lumbar region: Secondary | ICD-10-CM | POA: Diagnosis not present

## 2015-05-25 ENCOUNTER — Other Ambulatory Visit: Payer: Self-pay | Admitting: Internal Medicine

## 2015-05-25 DIAGNOSIS — J690 Pneumonitis due to inhalation of food and vomit: Secondary | ICD-10-CM

## 2015-05-30 ENCOUNTER — Ambulatory Visit
Admission: RE | Admit: 2015-05-30 | Discharge: 2015-05-30 | Disposition: A | Discharge status: Home / Self Care | Payer: Medicare Other | Source: Ambulatory Visit | Attending: Internal Medicine | Admitting: Internal Medicine

## 2015-05-30 DIAGNOSIS — J449 Chronic obstructive pulmonary disease, unspecified: Secondary | ICD-10-CM | POA: Diagnosis not present

## 2015-05-30 DIAGNOSIS — Z86718 Personal history of other venous thrombosis and embolism: Secondary | ICD-10-CM | POA: Insufficient documentation

## 2015-05-30 DIAGNOSIS — I2510 Atherosclerotic heart disease of native coronary artery without angina pectoris: Secondary | ICD-10-CM | POA: Diagnosis not present

## 2015-05-30 DIAGNOSIS — Z923 Personal history of irradiation: Secondary | ICD-10-CM | POA: Insufficient documentation

## 2015-05-30 DIAGNOSIS — I959 Hypotension, unspecified: Secondary | ICD-10-CM | POA: Diagnosis not present

## 2015-05-30 DIAGNOSIS — Z8509 Personal history of malignant neoplasm of other digestive organs: Secondary | ICD-10-CM | POA: Diagnosis not present

## 2015-05-30 DIAGNOSIS — T17918A Gastric contents in respiratory tract, part unspecified causing other injury, initial encounter: Secondary | ICD-10-CM | POA: Insufficient documentation

## 2015-05-30 DIAGNOSIS — G252 Other specified forms of tremor: Secondary | ICD-10-CM | POA: Diagnosis not present

## 2015-05-30 DIAGNOSIS — J690 Pneumonitis due to inhalation of food and vomit: Secondary | ICD-10-CM | POA: Diagnosis present

## 2015-05-30 DIAGNOSIS — K2270 Barrett's esophagus without dysplasia: Secondary | ICD-10-CM | POA: Insufficient documentation

## 2015-05-30 DIAGNOSIS — R1310 Dysphagia, unspecified: Secondary | ICD-10-CM | POA: Insufficient documentation

## 2015-05-30 NOTE — Therapy (Signed)
[place], [place], [address] Phone: [phone]   Fax:     Modified Barium Swallow  Patient Details  Name: John Mendoza MRN: [id number] Date of Birth: 1945-09-20 Referring Provider: Dr. Ramonita Lab  Encounter Date: 05/30/2015      End of Session - 05/30/15 1624    Visit Number 1   Number of Visits 1   Date for SLP Re-Evaluation 05/30/15   SLP Start Time 1300   SLP Stop Time  1600   SLP Time Calculation (min) 180 min   Activity Tolerance Patient tolerated treatment well      Past Medical History  Diagnosis Date  . Barrett esophagus   . Hypertension   . Hyperlipidemia   . Multiple pulmonary nodules   . CHF (congestive heart failure)   . Lumbar spinal stenosis   . COPD (chronic obstructive pulmonary disease)   . CAD (coronary artery disease)   . On home oxygen therapy   . DVT (deep venous thrombosis) 11/2013; 02/01/2015  . Parkinson's disease   . Recurrent aspiration pneumonia   . Sleep apnea   . Hypothyroidism   . Type II diabetes mellitus   . Iron deficiency anemia   . GERD (gastroesophageal reflux disease)   . Seizures   . DJD (degenerative joint disease)   . Arthritis   . Chronic lower back pain   . History of gout   . Depression   . Anxiety   . Basal cell carcinoma   . Throat cancer   . Heart attack 03/20/1995    Past Surgical History  Procedure Laterality Date  . Anterior cervical decomp/discectomy fusion  X 2  . Radical neck dissection Right ~ 1998    "throat cancer"  . Total knee arthroplasty Right 2011  . Thyroidectomy  ~ 1996 X 2  . Cataract extraction w/ intraocular lens  implant, bilateral Bilateral   . Tonsillectomy    . Excisional hemorrhoidectomy    . Joint replacement    . Back surgery    . Posterior fusion cervical spine  X 1  . Coronary angioplasty with stent placement    . Coronary artery bypass graft  1996    "CABG X3"  . Basal cell carcinoma  excision      There were no vitals filed for this visit.  Visit Diagnosis: Recurrent aspiration pneumonia ([place]) - Plan: DG OP Swallowing Func-Medicare/Speech Path, DG OP Swallowing Func-Medicare/Speech Path        SLP Evaluation [facility] - 05/30/15 0001    SLP Visit Information   SLP Received On 05/30/15   Referring Provider Dr. 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  Recent aspiration pneumonia.  Patient had an MBS here in 2013 and a recent MBS (01/2015) at [facility] while very ill and debilitated.   Objective:  Radiological Procedure: A videoflouroscopic evaluation of oral-preparatory, reflex initiation, and pharyngeal phases of the swallow was performed; as well as a screening of the upper esophageal phase.  I. POSTURE: Upright in MBS chair  II. VIEW: Lateral  III. COMPENSATORY STRATEGIES: Throat clear and swallow- effectively clears penetrated honey-thick and nectar-thick liquids.  Not effective for thin liquids  IV. BOLUSES ADMINISTERED:   Thin Liquid: 2 small sips   Nectar-thick Liquid: 3 sips   Honey-thick Liquid: 3 sips  Puree: 2 teaspoon boluses   Mechanical Soft: 1/4 graham cracker in applesauce  V. RESULTS OF EVALUATION: A. ORAL PREPARATORY PHASE: (The lips, tongue, and velum are observed for strength and coordination): lingual tremors       **Overall Severity Rating: Mild  B. SWALLOW INITIATION/REFLEX: (The reflex is normal if "triggered" by the time the bolus reached the base of the tongue) Triggers while falling from the valleculae to the pyriform sinuses  **Overall Severity Rating:Moderate  C. PHARYNGEAL PHASE: (Pharyngeal function is normal if the bolus shows rapid, smooth, and continuous transit through the pharynx and there is no pharyngeal residue after the swallow): Decreased  tongue base retraction,  Decreased hyolaryngeal movement; moderate pharyngeal residue  **Overall Severity Rating: Moderate  D. LARYNGEAL PENETRATION: (Material entering into the laryngeal inlet/vestibule but not aspirated): Laryngeal penetration with laryngeal vestibule residue with nectar-thick and honey-thick liquids   E. ASPIRATION: Thin liquid  F. ESOPHAGEAL PHASE: (Screening of the upper esophagus): no observed abnormality in the viewable cervical esophagus  ASSESSMENT: This 69 year old man with complicated medical history including throat cancer with radiation treatment in the past; is presenting with moderate oropharyngeal dysphagia characterized by lingual tremoring, delayed pharyngeal swallow initiation (triggers while falling from the valleculae to the pyriform sinuses), reduced tongue base retraction, reduced hyolaryngeal movement, and moderate pharyngeal residue post swallow .  There was no observed laryngeal penetration/aspiration of solids.  There was laryngeal penetration with laryngeal vestibule residue (no aspiration) with honey-thick and nectar-thick liquid.  A throat clear maneuver and swallow is effective in clearing penetrated material and aids pharyngeal clearance as well.  There was aspiration of thin liquid as the patient could not generate the throat clear fast enough to prevent the penetrated material from entering the airway.  This study is worse than the prior study at this facility (01/17/2012) but much better than the study done at [facility] (02/03/2015) while he was very ill and debilitated.  This study supports a soft/moist diet with nectar-thick liquids and strict adherence to throat clear and swallow maneuver every 1 to 3 swallows.  The patient has swallowing exercises and I have given him 2 more.  He is reminded to maintain strict and stringent oral care.  He was advised that if he wants to sip water, he should wait at least 30 minutes after eating/drinking anything and  perform stringent oral care.  We reviewed the probability that his swallow would worsen due to ongoing late effects of radiation treatment; the exercises can at best maintain his current swallow function.  PLAN/RECOMMENDATIONS:  A. Diet: Soft/moist diet with nectar thick liquid- throat clear and swallow every 1-3 swallows   B. Swallowing Precautions: throat clear and swallow; thicken liquids; stringent oral care;    C. Recommended consultation to N/A  D. Therapy recommendations: education provided at the time of the study, patient has my card if he has questions   E. Results and recommendations were discussed with the patient immediately following the study and the final report will be routed to his MD.          G-Codes - 06-08-15 1624    Functional Assessment Tool Used MBS   Functional Limitations Swallowing   Swallow Current Status [id number]) At least 40 percent but less than 60 percent impaired, limited or restricted   Swallow Goal Status [id number]) At least 40 percent but less than 60 percent impaired, limited or restricted   Swallow Discharge Status [id number]) At least 40 percent but less than 60 percent  impaired, limited or restricted          Problem List Patient Active Problem List   Diagnosis Date Noted  . Dehydration 03/07/2015  . Orthostatic hypotension 03/06/2015  . Hypotension 03/06/2015  . Constipation 02/05/2015  . Dysphagia causing pulmonary aspiration with swallowing 02/03/2015  . Diskitis   . 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 Discitis of lumbar region 02/01/2015  . Epidural abscess 02/01/2015  . Warfarin anticoagulation 02/01/2015  . Parkinsons disease ([place]) 02/01/2015  . Vertebral osteomyelitis ([place]) 02/01/2015  . Dysphagia/chronic 02/01/2015  . Bacteremia due to group B Streptococcus 01/28/2015  . Acute on chronic respiratory failure with hypoxia ([place]) 01/26/2015  . Sepsis ([place]) 01/26/2015  . History of DVT (deep vein  thrombosis) 02/20/2014  . 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 Multiple pulmonary nodules 01/15/2012  . Aspiration pneumonitis ([place]) 01/15/2012  . Hypertension 01/15/2012  . Barrett's esophagus 01/15/2012  . CAD (coronary artery disease) 01/15/2012  . COPD (chronic obstructive pulmonary disease) ([place]) 01/15/2012   Leroy Sea, MS/CCC- SLP  John [facility] 05/30/2015, 4:25 PM  [facility] [place], [place], [address] Phone: [phone]   Fax:     Name: John Mendoza MRN: [id number] Date of Birth: May 15, 1946

## 2015-07-03 ENCOUNTER — Telehealth: Payer: Self-pay | Admitting: *Deleted

## 2015-07-03 NOTE — Telephone Encounter (Signed)
Noted  

## 2015-07-03 NOTE — Telephone Encounter (Signed)
Home health calling stating pt was not up for it today and did not want to do Physical therapy. Just keeping [place] updated.

## 2015-07-03 NOTE — Telephone Encounter (Signed)
S/w Beth, Clinical Supervisor, at [facility] to inform her that Mr. John Mendoza is not a pt of ours. [name] is appreciative of the call and will correct the cardiologist noted.

## 2015-08-06 ENCOUNTER — Ambulatory Visit: Admission: RE | Admit: 2015-08-06 | Payer: Medicare Other | Source: Ambulatory Visit | Admitting: Gastroenterology

## 2015-08-06 ENCOUNTER — Encounter: Admission: RE | Payer: Self-pay | Source: Ambulatory Visit

## 2015-08-06 SURGERY — COLONOSCOPY WITH PROPOFOL
Anesthesia: General

## 2015-10-29 ENCOUNTER — Emergency Department
Admission: EM | Admit: 2015-10-29 | Discharge: 2015-10-29 | Disposition: A | Discharge status: Home / Self Care | Payer: Medicare Other | Attending: Student | Admitting: Student

## 2015-10-29 ENCOUNTER — Emergency Department: Payer: Medicare Other

## 2015-10-29 ENCOUNTER — Encounter: Payer: Self-pay | Admitting: Emergency Medicine

## 2015-10-29 DIAGNOSIS — Z7984 Long term (current) use of oral hypoglycemic drugs: Secondary | ICD-10-CM | POA: Diagnosis not present

## 2015-10-29 DIAGNOSIS — E039 Hypothyroidism, unspecified: Secondary | ICD-10-CM | POA: Insufficient documentation

## 2015-10-29 DIAGNOSIS — I509 Heart failure, unspecified: Secondary | ICD-10-CM | POA: Insufficient documentation

## 2015-10-29 DIAGNOSIS — E119 Type 2 diabetes mellitus without complications: Secondary | ICD-10-CM | POA: Insufficient documentation

## 2015-10-29 DIAGNOSIS — E785 Hyperlipidemia, unspecified: Secondary | ICD-10-CM | POA: Insufficient documentation

## 2015-10-29 DIAGNOSIS — I10 Essential (primary) hypertension: Secondary | ICD-10-CM | POA: Insufficient documentation

## 2015-10-29 DIAGNOSIS — Z87891 Personal history of nicotine dependence: Secondary | ICD-10-CM | POA: Insufficient documentation

## 2015-10-29 DIAGNOSIS — G20 Parkinson's disease: Secondary | ICD-10-CM | POA: Diagnosis not present

## 2015-10-29 DIAGNOSIS — I2510 Atherosclerotic heart disease of native coronary artery without angina pectoris: Secondary | ICD-10-CM | POA: Insufficient documentation

## 2015-10-29 DIAGNOSIS — I110 Hypertensive heart disease with heart failure: Secondary | ICD-10-CM | POA: Diagnosis not present

## 2015-10-29 DIAGNOSIS — Z79899 Other long term (current) drug therapy: Secondary | ICD-10-CM | POA: Diagnosis not present

## 2015-10-29 DIAGNOSIS — R5383 Other fatigue: Secondary | ICD-10-CM | POA: Insufficient documentation

## 2015-10-29 DIAGNOSIS — J449 Chronic obstructive pulmonary disease, unspecified: Secondary | ICD-10-CM | POA: Diagnosis not present

## 2015-10-29 LAB — COMPREHENSIVE METABOLIC PANEL
ALT: <5 U/L — ABNORMAL LOW (ref 17–63)
AST: 17 U/L (ref 15–41)
Albumin: 3.8 g/dL (ref 3.5–5.0)
Alkaline Phosphatase: 77 U/L (ref 38–126)
Anion gap: 8 (ref 5–15)
BUN: 15 mg/dL (ref 6–20)
CO2: 30 mmol/L (ref 22–32)
Calcium: 9 mg/dL (ref 8.9–10.3)
Chloride: 98 mmol/L — ABNORMAL LOW (ref 101–111)
Creatinine, Ser: 0.92 mg/dL (ref 0.61–1.24)
GFR calc Af Amer: >60 mL/min (ref >60)
GFR calc non Af Amer: >60 mL/min (ref >60)
Glucose, Bld: 116 mg/dL — ABNORMAL HIGH (ref 65–99)
Potassium: 4.1 mmol/L (ref 3.5–5.1)
Sodium: 136 mmol/L (ref 135–145)
Total Bilirubin: 0.5 mg/dL (ref 0.3–1.2)
Total Protein: 6.9 g/dL (ref 6.5–8.1)

## 2015-10-29 LAB — CBC WITH DIFFERENTIAL/PLATELET
Basophils Absolute: 0 10*3/uL (ref 0–0.1)
Basophils Relative: 1 %
Eosinophils Absolute: 0.1 10*3/uL (ref 0–0.7)
Eosinophils Relative: 2 %
HCT: 35.3 % — ABNORMAL LOW (ref 40.0–52.0)
Hemoglobin: 11.6 g/dL — ABNORMAL LOW (ref 13.0–18.0)
Lymphocytes Relative: 15 %
Lymphs Abs: 0.8 10*3/uL — ABNORMAL LOW (ref 1.0–3.6)
MCH: 26.9 pg (ref 26.0–34.0)
MCHC: 32.8 g/dL (ref 32.0–36.0)
MCV: 82 fL (ref 80.0–100.0)
Monocytes Absolute: 0.5 10*3/uL (ref 0.2–1.0)
Monocytes Relative: 9 %
Neutro Abs: 4 10*3/uL (ref 1.4–6.5)
Neutrophils Relative %: 73 %
Platelets: 186 10*3/uL (ref 150–440)
RBC: 4.3 MIL/uL — ABNORMAL LOW (ref 4.40–5.90)
RDW: 16.7 % — ABNORMAL HIGH (ref 11.5–14.5)
WBC: 5.5 10*3/uL (ref 3.8–10.6)

## 2015-10-29 LAB — APTT: APTT: 41 s — AB (ref 24–36)

## 2015-10-29 LAB — URINALYSIS COMPLETE WITH MICROSCOPIC (ARMC ONLY)
BILIRUBIN URINE: NEGATIVE
Bacteria, UA: NONE SEEN
Glucose, UA: NEGATIVE mg/dL
Hgb urine dipstick: NEGATIVE
KETONES UR: NEGATIVE mg/dL
Nitrite: NEGATIVE
PH: 7 (ref 5.0–8.0)
Protein, ur: NEGATIVE mg/dL
SPECIFIC GRAVITY, URINE: 1.009 (ref 1.005–1.030)
SQUAMOUS EPITHELIAL / LPF: NONE SEEN

## 2015-10-29 LAB — TROPONIN I: TROPONIN I: 0.03 ng/mL (ref <0.031)

## 2015-10-29 LAB — PROTIME-INR
INR: 1.99
Prothrombin Time: 22.5 seconds — ABNORMAL HIGH (ref 11.4–15.0)

## 2015-10-29 NOTE — ED Notes (Signed)
Pt in via EMS; pt reports generalized weakness over the last six weeks.  Pt able to stand from EMS stretcher and ambulate to ED stretcher.  Pt states, "I would not be here if it weren't for my wife."  Per EMS, wife reports pt is "just going down hill."  Pt A/Ox4, hyptertensive, other vitals WDL.

## 2015-10-29 NOTE — ED Notes (Signed)
Patient transported to X-ray 

## 2015-10-29 NOTE — ED Notes (Signed)
Lab notified to add on APTT, PT-INR

## 2015-10-29 NOTE — ED Provider Notes (Signed)
[facility] Emergency Department Provider Note  ____________________________________________  Time seen: Approximately 6:37 PM  I have reviewed the triage vital signs and the nursing notes.   HISTORY  Chief Complaint Fatigue    HPI John Mendoza is a 70 y.o. male with history of CAD, CHF, Parkinson's disease, history of DVT on warfarin who presents for evaluation of some generalized weakness as well as hypertension ongoing for several weeks, gradual onset, constant, no modifying factors. Patient reports that he has been taking his blood pressure at home and it has been "200 over something". He is scheduled to follow up with his primary care doctor for repeat evaluation of hypertension. His wife reports that today he seemed a little unsteady on his feet however he has not been ambulate with his walker, choosing instead to use his cane. He denies any chest pain, difficulty breathing, abdominal pain, vomiting, diarrhea, fevers or chills. No numbness or focal weakness. No recent falls or head injury. No headache or vision change.   Past Medical History  Diagnosis Date  . Barrett esophagus   . Hypertension   . Hyperlipidemia   . Multiple pulmonary nodules   . CHF (congestive heart failure) ([place])   . Lumbar spinal stenosis   . COPD (chronic obstructive pulmonary disease) ([place])   . CAD (coronary artery disease)   . On home oxygen therapy   . DVT (deep venous thrombosis) ([place]) 11/2013; 02/01/2015  . Parkinson's disease ([place])   . Recurrent aspiration pneumonia ([place])   . Sleep apnea   . Hypothyroidism   . Type II diabetes mellitus ([place])   . Iron deficiency anemia   . GERD (gastroesophageal reflux disease)   . Seizures ([place])   . DJD (degenerative joint disease)   . Arthritis   . Chronic lower back pain   . History of gout   . Depression   . Anxiety   . Basal cell carcinoma   . Throat cancer ([place])   . Heart attack ([place]) 03/20/1995    Patient Active Problem  List   Diagnosis Date Noted  . Dehydration 03/07/2015  . Orthostatic hypotension 03/06/2015  . Hypotension 03/06/2015  . Constipation 02/05/2015  . Dysphagia causing pulmonary aspiration with swallowing 02/03/2015  . Diskitis   . 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 Discitis of lumbar region 02/01/2015  . Epidural abscess 02/01/2015  . Warfarin anticoagulation 02/01/2015  . Parkinsons disease ([place]) 02/01/2015  . Vertebral osteomyelitis ([place]) 02/01/2015  . Dysphagia/chronic 02/01/2015  . Bacteremia due to group B Streptococcus 01/28/2015  . Acute on chronic respiratory failure with hypoxia ([place]) 01/26/2015  . Sepsis ([place]) 01/26/2015  . History of DVT (deep vein thrombosis) 02/20/2014  . 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 Multiple pulmonary nodules 01/15/2012  . Aspiration pneumonitis ([place]) 01/15/2012  . Hypertension 01/15/2012  . Barrett's esophagus 01/15/2012  . CAD (coronary artery disease) 01/15/2012  . COPD (chronic obstructive pulmonary disease) ([place]) 01/15/2012    Past Surgical History  Procedure Laterality Date  . Anterior cervical decomp/discectomy fusion  X 2  . Radical neck dissection Right ~ 1998    "throat cancer"  . Total knee arthroplasty Right 2011  . Thyroidectomy  ~ 1996 X 2  . Cataract extraction w/ intraocular lens  implant, bilateral Bilateral   . Tonsillectomy    . Excisional hemorrhoidectomy    . Joint replacement    . Back surgery    . Posterior fusion cervical spine  X 1  .  Coronary angioplasty with stent placement    . Coronary artery bypass graft  1996    "CABG X3"  . Basal cell carcinoma excision      Current Outpatient Rx  Name  Route  Sig  Dispense  Refill  . Artificial Tear Ointment (DRY EYES OP)   Both Eyes   Place 1 drop into both eyes daily as needed (dry eyes).         Marland Kitchen aspirin EC 81 MG tablet   Oral   Take 81 mg by mouth at bedtime.          Marland Kitchen atorvastatin (LIPITOR)  40 MG tablet   Oral   Take 40 mg by mouth at bedtime.         . bisacodyl (DULCOLAX) 10 MG suppository   Rectal   Place 1 suppository (10 mg total) rectally daily as needed for moderate constipation.   12 suppository   0   . carbidopa-levodopa (SINEMET IR) 25-100 MG per tablet   Oral   Take 3 tablets by mouth 3 (three) times daily.          . cefTRIAXone (ROCEPHIN) 40 MG/ML IVPB   Intravenous   Inject 50 mLs (2 g total) into the vein daily. Stop after last dose on 03/30/15   50 mL   35   . clonazePAM (KLONOPIN) 0.5 MG tablet   Oral   Take 0.5 mg by mouth every morning.         . clonazePAM (KLONOPIN) 1 MG tablet   Oral   Take 1 mg by mouth at bedtime.         . docusate sodium (COLACE) 100 MG capsule   Oral   Take 1 capsule (100 mg total) by mouth 2 (two) times daily. Patient not taking: Reported on 03/06/2015   10 capsule   0   . fentaNYL (DURAGESIC - DOSED MCG/HR) 50 MCG/HR   Transdermal   Place 1 patch (50 mcg total) onto the skin every 3 (three) days.   10 patch   0   . lamoTRIgine (LAMICTAL) 25 MG tablet   Oral   Take 25 mg by mouth 2 (two) times daily.         Marland Kitchen levothyroxine (SYNTHROID, LEVOTHROID) 137 MCG tablet   Oral   Take 137 mcg by mouth daily.         Marland Kitchen losartan (COZAAR) 25 MG tablet   Oral   Take 1 tablet (25 mg total) by mouth daily.   30 tablet   11   . metFORMIN (GLUCOPHAGE) 500 MG tablet   Oral   Take 500 mg by mouth 2 (two) times daily.         . mometasone-formoterol (DULERA) 200-5 MCG/ACT AERO   Inhalation   Inhale 2 puffs into the lungs 2 (two) times daily. Patient not taking: Reported on 03/06/2015   1 Inhaler   0   . Multiple Vitamin (MULTI-VITAMINS) TABS   Oral   Take 1 tablet by mouth daily. Take the same brand of multivitamins while on warfarin (coumadin).  If multivitamin product is changed, let your provider know.  Have INR checked in 3-5 days after taking new product.         . nitroGLYCERIN (NITROSTAT)  0.4 MG SL tablet   Sublingual   Place 0.4 mg under the tongue every 5 (five) minutes x 3 doses as needed for chest pain.          Marland Kitchen  Nutritional Supplements (FEEDING SUPPLEMENT, JEVITY 1.5 CAL/FIBER,) LIQD   Per Tube   Place 355 mLs into feeding tube 4 (four) times daily. 374mL = 1.5 cans   237 mL   99   . omeprazole (PRILOSEC) 2 mg/mL SUSP   Per Tube   Place 10 mLs (20 mg total) into feeding tube 2 (two) times daily before a meal.   600 mL   11   . oxyCODONE-acetaminophen (PERCOCET/ROXICET) 5-325 MG per tablet   Oral   Take 1 tablet by mouth every 6 (six) hours as needed for severe pain.   30 tablet   0   . PARoxetine (PAXIL) 20 MG tablet   Oral   Take 20 mg by mouth 3 (three) times daily.         . predniSONE (DELTASONE) 20 MG tablet   Oral   Take 1 tablet (20 mg total) by mouth daily with breakfast. Patient not taking: Reported on 03/06/2015   1 tablet   0   . tamsulosin (FLOMAX) 0.4 MG CAPS   Oral   Take 0.4 mg by mouth daily.          Marland Kitchen warfarin (COUMADIN) 5 MG tablet   Oral   Take 1 tablet (5 mg total) by mouth daily.   30 tablet   11   . Water For Irrigation, Sterile (FREE WATER) SOLN   Per Tube   Place 125 mLs into feeding tube 4 (four) times daily. Before AND after each tube feed administration   600 mL   0     Allergies Other  Family History  Problem Relation Age of Onset  . Leukemia Paternal Grandmother   . Cancer Maternal Grandmother     stomach  . Cancer Paternal Aunt   . Other Father     bacterial endocarditis  . Rheum arthritis Father     Social History Social History  Substance Use Topics  . Smoking status: Former Smoker -- 2.00 packs/day for 35 years    Types: Cigarettes    Quit date: 03/20/1995  . Smokeless tobacco: Never Used  . Alcohol Use: Yes     Comment: 02/01/2015 "stopped drinking in ~ 1996; never had problem w/it"    Review of Systems Constitutional: No fever/chills Eyes: No visual changes. ENT: No sore  throat. Cardiovascular: Denies chest pain. Respiratory: Denies shortness of breath. Gastrointestinal: No abdominal pain.  No nausea, no vomiting.  No diarrhea.  No constipation. Genitourinary: Negative for dysuria. Musculoskeletal: Negative for back pain. Skin: Negative for rash. Neurological: Negative for headaches, focal weakness or numbness.  10-point ROS otherwise negative.  ____________________________________________   PHYSICAL EXAM:  Filed Vitals:   10/29/15 1905 10/29/15 1930 10/29/15 2000 10/29/15 2100  BP:  172/89 179/94   Pulse: 67   63  Temp:      TempSrc:      Resp: 17 17 23 18   Height:      Weight:      SpO2: 96%   97%      Constitutional: Alert and oriented. Well appearing and in no acute distress. Eyes: Conjunctivae are normal. PERRL. EOMI. Head: Atraumatic. Nose: No congestion/rhinnorhea. Mouth/Throat: Mucous membranes are moist.  Oropharynx non-erythematous. Neck: No stridor. Supple without meningismus. Cardiovascular: Normal rate, regular rhythm. Grossly normal heart sounds.  Good peripheral circulation. Respiratory: Normal respiratory effort.  No retractions. Lungs CTAB. Gastrointestinal: Soft and nontender. No distention.  No CVA tenderness. Genitourinary: deferred Musculoskeletal: No lower extremity tenderness nor edema.  No joint  effusions. Neurologic:  Normal speech and language. No gross focal neurologic deficits are appreciated. No gait instability. Patient relates well with very minimal assistance. He has 5 out of 5 strength in bilateral upper and lower extremities, sensation intact to light touch throughout. Skin:  Skin is warm, dry and intact. No rash noted. Psychiatric: Mood and affect are normal. Speech and behavior are normal.  ____________________________________________   LABS (all labs ordered are listed, but only abnormal results are displayed)  Labs Reviewed  CBC WITH DIFFERENTIAL/PLATELET - Abnormal; Notable for the following:     RBC 4.30 (*)    Hemoglobin 11.6 (*)    HCT 35.3 (*)    RDW 16.7 (*)    Lymphs Abs 0.8 (*)    All other components within normal limits  COMPREHENSIVE METABOLIC PANEL - Abnormal; Notable for the following:    Chloride 98 (*)    Glucose, Bld 116 (*)    ALT <5 (*)    All other components within normal limits  URINALYSIS COMPLETEWITH MICROSCOPIC ([facility] ONLY) - Abnormal; Notable for the following:    Color, Urine STRAW (*)    APPearance CLEAR (*)    Leukocytes, UA TRACE (*)    All other components within normal limits  PROTIME-INR - Abnormal; Notable for the following:    Prothrombin Time 22.5 (*)    All other components within normal limits  APTT - Abnormal; Notable for the following:    aPTT 41 (*)    All other components within normal limits  TROPONIN I   ____________________________________________  EKG  *ED ECG REPORT I, Joanne Gavel, the attending physician, personally viewed and interpreted this ECG.   Date: 10/29/2015  EKG Time: 18:43  Rate: 66  Rhythm: normal sinus rhythm  Axis: normal  Intervals:nonspecific intraventricular conduction delay, borderline  ST&T Change: No acute ST elevation. Q waves in lead 3.  ____________________________________________  RADIOLOGY  CXR  IMPRESSION: Mild cardiomegaly and bibasilar atelectasis.  CT head IMPRESSION: No acute intracranial process.  Left maxillary sinus mucosal thickening.   ____________________________________________   PROCEDURES  Procedure(s) performed: None  Critical Care performed: No  ____________________________________________   INITIAL IMPRESSION / ASSESSMENT AND PLAN / ED COURSE  Pertinent labs & imaging results that were available during my care of the patient were reviewed by me and considered in my medical decision making (see chart for details).  CAMDYN BLOEMKER is a 70 y.o. male with history of CAD, CHF, Parkinson's disease, history of DVT on warfarin who presents for evaluation  of some generalized weakness as well as hypertension ongoing for several weeks. Currently he reports he feels well. On exam he is well-appearing and in no acute distress. Vital signs stable, he is afebrile. Blood pressure 189/97, asymptomatic hypertension with an intact neurological examination. We'll plan for screening labs, CT head, chest x-ray, urinalysis and reassess for disposition.  ----------------------------------------- 9:22 PM on 10/29/2015 ----------------------------------------- Labs reviewed. CBC with mild anemia, hemoglobin 11.6. A remarkable CMP, negative troponin, urinalysis by consistent with infection. INR 1.99. CT head shows no acute intracranial process, there is some left maxillary sinus mucosal thickening however patient has only had runny nose for a few days and does not require antibiotics for this at this time. Chest x-ray with  mild cardiomegaly. The patient is anxious for discharge. We discussed that he would follow up with his primary care doctor tomorrow. DC with return precautions. He and his wife at bedside are comfortable with the discharge plan.  ____________________________________________  FINAL CLINICAL IMPRESSION(S) / ED DIAGNOSES  Final diagnoses:  Essential hypertension  Other fatigue      Joanne Gavel, MD 10/29/15 2123

## 2015-11-29 ENCOUNTER — Emergency Department: Payer: Medicare Other

## 2015-11-29 ENCOUNTER — Encounter: Payer: Self-pay | Admitting: *Deleted

## 2015-11-29 ENCOUNTER — Inpatient Hospital Stay
Admission: EM | Admit: 2015-11-29 | Discharge: 2015-12-01 | DRG: 178 | Disposition: A | Discharge status: Home / Self Care | Payer: Medicare Other | Attending: Internal Medicine | Admitting: Internal Medicine

## 2015-11-29 DIAGNOSIS — Z9981 Dependence on supplemental oxygen: Secondary | ICD-10-CM

## 2015-11-29 DIAGNOSIS — J449 Chronic obstructive pulmonary disease, unspecified: Secondary | ICD-10-CM | POA: Diagnosis present

## 2015-11-29 DIAGNOSIS — Z96651 Presence of right artificial knee joint: Secondary | ICD-10-CM | POA: Diagnosis present

## 2015-11-29 DIAGNOSIS — M4806 Spinal stenosis, lumbar region: Secondary | ICD-10-CM | POA: Diagnosis present

## 2015-11-29 DIAGNOSIS — Z981 Arthrodesis status: Secondary | ICD-10-CM

## 2015-11-29 DIAGNOSIS — E119 Type 2 diabetes mellitus without complications: Secondary | ICD-10-CM | POA: Diagnosis present

## 2015-11-29 DIAGNOSIS — Z7952 Long term (current) use of systemic steroids: Secondary | ICD-10-CM

## 2015-11-29 DIAGNOSIS — M545 Low back pain: Secondary | ICD-10-CM | POA: Diagnosis present

## 2015-11-29 DIAGNOSIS — Z85819 Personal history of malignant neoplasm of unspecified site of lip, oral cavity, and pharynx: Secondary | ICD-10-CM

## 2015-11-29 DIAGNOSIS — G40909 Epilepsy, unspecified, not intractable, without status epilepticus: Secondary | ICD-10-CM | POA: Diagnosis present

## 2015-11-29 DIAGNOSIS — Z8261 Family history of arthritis: Secondary | ICD-10-CM | POA: Diagnosis not present

## 2015-11-29 DIAGNOSIS — Y92000 Kitchen of unspecified non-institutional (private) residence as  the place of occurrence of the external cause: Secondary | ICD-10-CM | POA: Diagnosis not present

## 2015-11-29 DIAGNOSIS — Z7982 Long term (current) use of aspirin: Secondary | ICD-10-CM

## 2015-11-29 DIAGNOSIS — N390 Urinary tract infection, site not specified: Secondary | ICD-10-CM | POA: Diagnosis present

## 2015-11-29 DIAGNOSIS — R778 Other specified abnormalities of plasma proteins: Secondary | ICD-10-CM

## 2015-11-29 DIAGNOSIS — M1990 Unspecified osteoarthritis, unspecified site: Secondary | ICD-10-CM | POA: Diagnosis present

## 2015-11-29 DIAGNOSIS — K219 Gastro-esophageal reflux disease without esophagitis: Secondary | ICD-10-CM | POA: Diagnosis present

## 2015-11-29 DIAGNOSIS — I509 Heart failure, unspecified: Secondary | ICD-10-CM | POA: Diagnosis present

## 2015-11-29 DIAGNOSIS — Z79891 Long term (current) use of opiate analgesic: Secondary | ICD-10-CM

## 2015-11-29 DIAGNOSIS — G8929 Other chronic pain: Secondary | ICD-10-CM | POA: Diagnosis present

## 2015-11-29 DIAGNOSIS — J690 Pneumonitis due to inhalation of food and vomit: Principal | ICD-10-CM | POA: Diagnosis present

## 2015-11-29 DIAGNOSIS — J9610 Chronic respiratory failure, unspecified whether with hypoxia or hypercapnia: Secondary | ICD-10-CM | POA: Diagnosis present

## 2015-11-29 DIAGNOSIS — G20 Parkinson's disease: Secondary | ICD-10-CM | POA: Diagnosis present

## 2015-11-29 DIAGNOSIS — E785 Hyperlipidemia, unspecified: Secondary | ICD-10-CM | POA: Diagnosis present

## 2015-11-29 DIAGNOSIS — Z86718 Personal history of other venous thrombosis and embolism: Secondary | ICD-10-CM

## 2015-11-29 DIAGNOSIS — Z7984 Long term (current) use of oral hypoglycemic drugs: Secondary | ICD-10-CM

## 2015-11-29 DIAGNOSIS — J189 Pneumonia, unspecified organism: Secondary | ICD-10-CM

## 2015-11-29 DIAGNOSIS — W19XXXA Unspecified fall, initial encounter: Secondary | ICD-10-CM | POA: Diagnosis present

## 2015-11-29 DIAGNOSIS — Z806 Family history of leukemia: Secondary | ICD-10-CM | POA: Diagnosis not present

## 2015-11-29 DIAGNOSIS — I110 Hypertensive heart disease with heart failure: Secondary | ICD-10-CM | POA: Diagnosis present

## 2015-11-29 DIAGNOSIS — Z85828 Personal history of other malignant neoplasm of skin: Secondary | ICD-10-CM

## 2015-11-29 DIAGNOSIS — E039 Hypothyroidism, unspecified: Secondary | ICD-10-CM | POA: Diagnosis present

## 2015-11-29 DIAGNOSIS — Z87891 Personal history of nicotine dependence: Secondary | ICD-10-CM | POA: Diagnosis not present

## 2015-11-29 DIAGNOSIS — I248 Other forms of acute ischemic heart disease: Secondary | ICD-10-CM | POA: Diagnosis present

## 2015-11-29 DIAGNOSIS — R1310 Dysphagia, unspecified: Secondary | ICD-10-CM | POA: Diagnosis present

## 2015-11-29 DIAGNOSIS — R296 Repeated falls: Secondary | ICD-10-CM | POA: Diagnosis present

## 2015-11-29 DIAGNOSIS — I2510 Atherosclerotic heart disease of native coronary artery without angina pectoris: Secondary | ICD-10-CM | POA: Diagnosis present

## 2015-11-29 DIAGNOSIS — D638 Anemia in other chronic diseases classified elsewhere: Secondary | ICD-10-CM | POA: Diagnosis present

## 2015-11-29 DIAGNOSIS — Z7901 Long term (current) use of anticoagulants: Secondary | ICD-10-CM | POA: Diagnosis not present

## 2015-11-29 DIAGNOSIS — Z79899 Other long term (current) drug therapy: Secondary | ICD-10-CM | POA: Diagnosis not present

## 2015-11-29 DIAGNOSIS — G4730 Sleep apnea, unspecified: Secondary | ICD-10-CM | POA: Diagnosis present

## 2015-11-29 DIAGNOSIS — M25521 Pain in right elbow: Secondary | ICD-10-CM | POA: Diagnosis present

## 2015-11-29 DIAGNOSIS — R7989 Other specified abnormal findings of blood chemistry: Secondary | ICD-10-CM

## 2015-11-29 HISTORY — DX: Hypersensitivity angiitis: M31.0

## 2015-11-29 LAB — URINALYSIS COMPLETE WITH MICROSCOPIC (ARMC ONLY)
Bilirubin Urine: NEGATIVE
GLUCOSE, UA: NEGATIVE mg/dL
Hgb urine dipstick: NEGATIVE
Nitrite: NEGATIVE
PROTEIN: 100 mg/dL — AB
Specific Gravity, Urine: 1.027 (ref 1.005–1.030)
pH: 5 (ref 5.0–8.0)

## 2015-11-29 LAB — CBC WITH DIFFERENTIAL/PLATELET
BASOS ABS: 0 10*3/uL (ref 0–0.1)
EOS ABS: 0 10*3/uL (ref 0–0.7)
Eosinophils Relative: 0 %
HEMATOCRIT: 33.8 % — AB (ref 40.0–52.0)
HEMOGLOBIN: 10.7 g/dL — AB (ref 13.0–18.0)
Lymphocytes Relative: 2 %
Lymphs Abs: 0.3 10*3/uL — ABNORMAL LOW (ref 1.0–3.6)
MCH: 25.7 pg — ABNORMAL LOW (ref 26.0–34.0)
MCHC: 31.7 g/dL — AB (ref 32.0–36.0)
MCV: 80.9 fL (ref 80.0–100.0)
MONO ABS: 0.7 10*3/uL (ref 0.2–1.0)
Neutro Abs: 15.5 10*3/uL — ABNORMAL HIGH (ref 1.4–6.5)
Neutrophils Relative %: 94 %
Platelets: 223 10*3/uL (ref 150–440)
RBC: 4.18 MIL/uL — ABNORMAL LOW (ref 4.40–5.90)
RDW: 17 % — AB (ref 11.5–14.5)
WBC: 16.4 10*3/uL — ABNORMAL HIGH (ref 3.8–10.6)

## 2015-11-29 LAB — APTT: APTT: 78 s — AB (ref 24–36)

## 2015-11-29 LAB — COMPREHENSIVE METABOLIC PANEL
ALT: 13 U/L — AB (ref 17–63)
AST: 14 U/L — AB (ref 15–41)
Albumin: 3.6 g/dL (ref 3.5–5.0)
Alkaline Phosphatase: 66 U/L (ref 38–126)
Anion gap: 6 (ref 5–15)
BUN: 19 mg/dL (ref 6–20)
CALCIUM: 8.6 mg/dL — AB (ref 8.9–10.3)
CO2: 34 mmol/L — AB (ref 22–32)
CREATININE: 0.8 mg/dL (ref 0.61–1.24)
Chloride: 96 mmol/L — ABNORMAL LOW (ref 101–111)
GFR calc non Af Amer: >60 mL/min (ref >60)
Glucose, Bld: 128 mg/dL — ABNORMAL HIGH (ref 65–99)
Potassium: 4.5 mmol/L (ref 3.5–5.1)
Sodium: 136 mmol/L (ref 135–145)
Total Bilirubin: 0.5 mg/dL (ref 0.3–1.2)
Total Protein: 6.8 g/dL (ref 6.5–8.1)

## 2015-11-29 LAB — LIPASE, BLOOD: Lipase: 15 U/L (ref 11–51)

## 2015-11-29 LAB — PROTIME-INR
INR: 3.32
PROTHROMBIN TIME: 33 s — AB (ref 11.4–15.0)

## 2015-11-29 LAB — TROPONIN I: TROPONIN I: 0.04 ng/mL — AB (ref <0.031)

## 2015-11-29 LAB — LACTIC ACID, PLASMA
Lactic Acid, Venous: 1 mmol/L (ref 0.5–2.0)
Lactic Acid, Venous: 0.6 mmol/L (ref 0.5–2.0)

## 2015-11-29 LAB — CK: Total CK: 184 U/L (ref 49–397)

## 2015-11-29 MED ORDER — ASPIRIN EC 81 MG PO TBEC
81.0000 mg | DELAYED_RELEASE_TABLET | Freq: Every day | ORAL | Status: DC
  Administered 2015-11-29 – 2015-11-30 (×2): 81 mg via ORAL
  Filled 2015-11-29 (×2): qty 1

## 2015-11-29 MED ORDER — PIPERACILLIN-TAZOBACTAM 3.375 G IVPB 30 MIN: 3.3750 g | Freq: Once | INTRAVENOUS | Status: DC

## 2015-11-29 MED ORDER — DEXTROSE 5 % IV SOLN: 500.0000 mg | Freq: Once | INTRAVENOUS | Status: DC

## 2015-11-29 MED ORDER — ACETAMINOPHEN 650 MG RE SUPP: 650.0000 mg | Freq: Four times a day (QID) | RECTAL | Status: DC | PRN

## 2015-11-29 MED ORDER — PIPERACILLIN-TAZOBACTAM 4.5 G IVPB: 4.5000 g | Freq: Three times a day (TID) | INTRAVENOUS | Status: DC

## 2015-11-29 MED ORDER — ONDANSETRON HCL 4 MG/2ML IJ SOLN: 4.0000 mg | Freq: Four times a day (QID) | INTRAMUSCULAR | Status: DC | PRN

## 2015-11-29 MED ORDER — LAMOTRIGINE 25 MG PO TABS
25.0000 mg | ORAL_TABLET | Freq: Two times a day (BID) | ORAL | Status: DC
  Administered 2015-11-29 – 2015-12-01 (×4): 25 mg via ORAL
  Filled 2015-11-29 (×5): qty 1

## 2015-11-29 MED ORDER — BISOPROLOL FUMARATE 10 MG PO TABS
10.0000 mg | ORAL_TABLET | Freq: Every day | ORAL | Status: DC
  Administered 2015-11-30 – 2015-12-01 (×2): 10 mg via ORAL
  Filled 2015-11-29 (×2): qty 1

## 2015-11-29 MED ORDER — WARFARIN - PHARMACIST DOSING INPATIENT: Freq: Every day | Status: DC

## 2015-11-29 MED ORDER — LOSARTAN POTASSIUM 25 MG PO TABS
25.0000 mg | ORAL_TABLET | Freq: Every day | ORAL | Status: DC
  Administered 2015-11-29 – 2015-12-01 (×3): 25 mg via ORAL
  Filled 2015-11-29 (×3): qty 1

## 2015-11-29 MED ORDER — TIZANIDINE HCL 4 MG PO TABS: 4.0000 mg | ORAL_TABLET | Freq: Three times a day (TID) | ORAL | Status: DC | PRN

## 2015-11-29 MED ORDER — CARBIDOPA-LEVODOPA 25-250 MG PO TABS
1.0000 | ORAL_TABLET | Freq: Three times a day (TID) | ORAL | Status: DC
  Administered 2015-11-29 – 2015-12-01 (×5): 1 via ORAL
  Filled 2015-11-29 (×7): qty 1

## 2015-11-29 MED ORDER — FENTANYL 50 MCG/HR TD PT72
50.0000 ug | MEDICATED_PATCH | TRANSDERMAL | Status: DC
  Filled 2015-11-29: qty 1

## 2015-11-29 MED ORDER — PANTOPRAZOLE SODIUM 40 MG PO TBEC
40.0000 mg | DELAYED_RELEASE_TABLET | Freq: Two times a day (BID) | ORAL | Status: DC
  Administered 2015-11-30 – 2015-12-01 (×3): 40 mg via ORAL
  Filled 2015-11-29 (×3): qty 1

## 2015-11-29 MED ORDER — BISACODYL 10 MG RE SUPP: 10.0000 mg | Freq: Every day | RECTAL | Status: DC | PRN

## 2015-11-29 MED ORDER — PIPERACILLIN-TAZOBACTAM 4.5 G IVPB
4.5000 g | Freq: Once | INTRAVENOUS | Status: AC
Start: 2015-11-29 — End: 2015-11-29
  Administered 2015-11-29: 4.5 g via INTRAVENOUS
  Filled 2015-11-29: qty 100

## 2015-11-29 MED ORDER — CLONAZEPAM 1 MG PO TABS
1.0000 mg | ORAL_TABLET | Freq: Every day | ORAL | Status: DC
  Administered 2015-11-29 – 2015-11-30 (×2): 1 mg via ORAL
  Filled 2015-11-29 (×2): qty 1

## 2015-11-29 MED ORDER — LEVOTHYROXINE SODIUM 50 MCG PO TABS
137.0000 ug | ORAL_TABLET | Freq: Every day | ORAL | Status: DC
  Administered 2015-11-30 – 2015-12-01 (×2): 137 ug via ORAL
  Filled 2015-11-29 (×2): qty 1

## 2015-11-29 MED ORDER — OXYCODONE HCL 5 MG PO TABS: 5.0000 mg | ORAL_TABLET | ORAL | Status: DC | PRN

## 2015-11-29 MED ORDER — WARFARIN SODIUM 2.5 MG PO TABS: 5.0000 mg | ORAL_TABLET | Freq: Every day | ORAL | Status: DC

## 2015-11-29 MED ORDER — SODIUM CHLORIDE 0.9 % IV SOLN
INTRAVENOUS | Status: DC
  Administered 2015-11-29: 22:00:00 via INTRAVENOUS

## 2015-11-29 MED ORDER — SODIUM CHLORIDE 0.9 % IV BOLUS (SEPSIS)
500.0000 mL | INTRAVENOUS | Status: AC
  Administered 2015-11-29: 500 mL via INTRAVENOUS

## 2015-11-29 MED ORDER — PIPERACILLIN-TAZOBACTAM 3.375 G IVPB
3.3750 g | Freq: Three times a day (TID) | INTRAVENOUS | Status: DC
  Administered 2015-11-29 – 2015-12-01 (×4): 3.375 g via INTRAVENOUS
  Filled 2015-11-29 (×5): qty 50

## 2015-11-29 MED ORDER — ONDANSETRON HCL 4 MG PO TABS: 4.0000 mg | ORAL_TABLET | Freq: Four times a day (QID) | ORAL | Status: DC | PRN

## 2015-11-29 MED ORDER — METFORMIN HCL 500 MG PO TABS
500.0000 mg | ORAL_TABLET | Freq: Two times a day (BID) | ORAL | Status: DC
  Administered 2015-11-30 – 2015-12-01 (×3): 500 mg via ORAL
  Filled 2015-11-29 (×3): qty 1

## 2015-11-29 MED ORDER — TAMSULOSIN HCL 0.4 MG PO CAPS
0.4000 mg | ORAL_CAPSULE | Freq: Every day | ORAL | Status: DC
  Administered 2015-11-30 – 2015-12-01 (×2): 0.4 mg via ORAL
  Filled 2015-11-29 (×2): qty 1

## 2015-11-29 MED ORDER — ATORVASTATIN CALCIUM 20 MG PO TABS
40.0000 mg | ORAL_TABLET | Freq: Every day | ORAL | Status: DC
  Administered 2015-11-29 – 2015-11-30 (×2): 40 mg via ORAL
  Filled 2015-11-29 (×2): qty 2

## 2015-11-29 MED ORDER — PIPERACILLIN-TAZOBACTAM 3.375 G IVPB
3.3750 g | Freq: Three times a day (TID) | INTRAVENOUS | Status: DC
  Filled 2015-11-29 (×2): qty 50

## 2015-11-29 MED ORDER — ARIPIPRAZOLE 2 MG PO TABS
5.0000 mg | ORAL_TABLET | Freq: Two times a day (BID) | ORAL | Status: DC
  Administered 2015-11-29 – 2015-12-01 (×4): 5 mg via ORAL
  Filled 2015-11-29 (×4): qty 3

## 2015-11-29 MED ORDER — WARFARIN - PHYSICIAN DOSING INPATIENT: Freq: Every day | Status: DC

## 2015-11-29 MED ORDER — DEXTROSE 5 % IV SOLN: 1.0000 g | INTRAVENOUS | Status: DC

## 2015-11-29 MED ORDER — ACETAMINOPHEN 325 MG PO TABS: 650.0000 mg | ORAL_TABLET | Freq: Four times a day (QID) | ORAL | Status: DC | PRN

## 2015-11-29 MED ORDER — PAROXETINE HCL 20 MG PO TABS
60.0000 mg | ORAL_TABLET | Freq: Every day | ORAL | Status: DC
  Administered 2015-11-30 – 2015-12-01 (×2): 60 mg via ORAL
  Filled 2015-11-29 (×2): qty 3

## 2015-11-29 NOTE — ED Notes (Signed)
Called floor to inquire when room would be ready, floor asked EVS who states half hour

## 2015-11-29 NOTE — Progress Notes (Signed)
ANTICOAGULATION CONSULT NOTE - Initial Consult  Pharmacy Consult for warfarin Indication: DVT 12/16/2013, popliteal   Allergies  Allergen Reactions  . Other Other (See Comments)    Dizziness, lightheadedness, Pt states that inhaled medications make him depressed and have negative thoughts.      Patient Measurements: Height: 5\' 5"  (165.1 cm) Weight: 167 lb (75.751 kg) IBW/kg (Calculated) : 61.5 Heparin Dosing Weight:   Vital Signs: Temp: 98 F (36.7 C) (05/25 2112) Temp Source: Oral (05/25 2112) BP: 132/63 mmHg (05/25 2112) Pulse Rate: 62 (05/25 2112)  Labs:  Recent Labs  11/29/15 1512  HGB 10.7*  HCT 33.8*  PLT 223  APTT 78*  LABPROT 33.0*  INR 3.32  CREATININE 0.80  CKTOTAL 184  TROPONINI 0.04*    Estimated Creatinine Clearance: 81.7 mL/min (by C-G formula based on Cr of 0.8).   Medical History: Past Medical History  Diagnosis Date  . Barrett esophagus   . Hypertension   . Hyperlipidemia   . Multiple pulmonary nodules   . CHF (congestive heart failure) ([place])   . Lumbar spinal stenosis   . COPD (chronic obstructive pulmonary disease) ([place])   . CAD (coronary artery disease)     on 2l home oxygen  . On home oxygen therapy   . DVT (deep venous thrombosis) ([place]) 11/2013; 02/01/2015    on coumadin  . Parkinson's disease ([place])   . Recurrent aspiration pneumonia ([place])   . Sleep apnea   . Hypothyroidism   . Type II diabetes mellitus ([place])   . Iron deficiency anemia   . GERD (gastroesophageal reflux disease)   . Seizures ([place])   . DJD (degenerative joint disease)   . Arthritis   . Chronic lower back pain   . History of gout   . Depression   . Anxiety   . Basal cell carcinoma   . Throat cancer ([place])   . Heart attack ([place]) 03/20/1995  . 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 sodium chloride      Assessment: 69 yom cc fall, PMH includes parkinson's with dysphagia and  aspiration, seizure disorder, GBS bacteremia and discitis/vertebral osteomyelitis in September 2016, and history of DVT in June 2015. Pharmacy consulted to dose VKA for history of DVT.  Goal of Therapy:  INR 2-3 Monitor platelets by anticoagulation protocol: Yes   Plan:  INR supratherapeutic this evening. Will recheck INR in AM.   Laural Benes, Pharm.D., BCPS Clinical Pharmacist 11/29/2015,9:44 PM

## 2015-11-29 NOTE — Progress Notes (Signed)
ANTICOAGULATION CONSULT NOTE - Initial Consult  Pharmacy Consult for warfarin Indication: VTE prophylaxis  Allergies  Allergen Reactions  . Other Other (See Comments)    Dizziness, lightheadedness, Pt states that inhaled medications make him depressed and have negative thoughts.     Patient Measurements: Height: 5\' 5"  (165.1 cm) Weight: 150 lb (68.04 kg) IBW/kg (Calculated) : 61.5  Vital Signs: Temp: 98.6 F (37 C) (05/25 1829) Temp Source: Oral (05/25 1829) BP: 124/68 mmHg (05/25 2000) Pulse Rate: 62 (05/25 2000)  Labs:  Recent Labs  11/29/15 1512  HGB 10.7*  HCT 33.8*  PLT 223  APTT 78*  LABPROT 33.0*  INR 3.32  CREATININE 0.80  CKTOTAL 184  TROPONINI 0.04*   Estimated Creatinine Clearance: 74.7 mL/min (by C-G formula based on Cr of 0.8).  Medical History: Past Medical History  Diagnosis Date  . Barrett esophagus   . Hypertension   . Hyperlipidemia   . Multiple pulmonary nodules   . CHF (congestive heart failure) ([place])   . Lumbar spinal stenosis   . COPD (chronic obstructive pulmonary disease) ([place])   . CAD (coronary artery disease)     on 2l home oxygen  . On home oxygen therapy   . DVT (deep venous thrombosis) ([place]) 11/2013; 02/01/2015    on coumadin  . Parkinson's disease ([place])   . Recurrent aspiration pneumonia ([place])   . Sleep apnea   . Hypothyroidism   . Type II diabetes mellitus ([place])   . Iron deficiency anemia   . GERD (gastroesophageal reflux disease)   . Seizures ([place])   . DJD (degenerative joint disease)   . Arthritis   . Chronic lower back pain   . History of gout   . Depression   . Anxiety   . Basal cell carcinoma   . Throat cancer ([place])   . Heart attack ([place]) 03/20/1995  . 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 Patient was taking warfarin prior to admission for VTE  prophylaxis. The home regimen is warfarin 5 mg PO daily.   Patient's INR is slightly supratherapeutic on admission at 3.32. Patient has already taken warfarin dose today.  Goal of Therapy:  INR 2-3 Monitor platelets by anticoagulation protocol: Yes   Plan:  Patient has already taken warfarin dose today. No orders entered at this time. INR ordered with AM labs tomorrow.  Thank you for the consult.  Lenis Noon, PharmD Clinical Pharmacist 11/29/2015,8:12 PM

## 2015-11-29 NOTE — ED Notes (Addendum)
Per EMS report, patient's wife states patient fell in the kitchen today. Patient c/o right elbow pain and increase in chronic back pain. Patient states he does and doesn't remember the fall. Patient c/o increased weakness and poor PO intake for the last 3-4 days. Patient has a history of Parkinson's disease and states his wife says he has been dragging his feet more. Patient's wife told EMS that the patient has had multiple falls recently. Patient is moving right arm freely upon arrival. Per EMS report, patient is usually on 3L O2 via [place] at home, but didn't wear the O2 in the kitchen because the tubing wouldn't reach.

## 2015-11-29 NOTE — H&P (Signed)
[facility] NAME: John Mendoza    MR#:  [id number]  DATE OF BIRTH:  [birth year]  DATE OF ADMISSION:  11/29/2015  PRIMARY CARE PHYSICIAN: Adin Hector, MD   REQUESTING/REFERRING PHYSICIAN: Dr. Hinda Kehr  CHIEF COMPLAINT:   Chief Complaint  Patient presents with  . Fall    HISTORY OF PRESENT ILLNESS:  Jaad Boilard  is a 70 y.o. male with a known history of Hypertension, hyperlipidemia, Parkinson's disease with dysphagia and recurrent aspiration pneumonias, sleep apnea, seizure disorder, chronic low back pain, history of discitis and vertebral osteomyelitis September 2016 secondary to group B streptococcus bacteremia, DVT on chronic Coumadin therapy presents from home secondary to worsening cough, weakness and disorientation. Patient has severe Parkinson's disease and has had prior admissions for aspiration pneumonia. He is on a nectar thick liquid diet at home. No obvious aspiration according to wife. Occasionally he has been drinking thin liquids. He has been having chills, increased sweats, decreased appetite over the last 2-3 days. He was noted to be more confused today and extremely weak. He had a fall in the kitchen today. Patient is on chronic 2 L home oxygen. At baseline he ambulates with a cane at home but also has a walker. The emergency room his vitals are stable, he has an increased white count of 16,000 and chest x-ray with new right middle lobe pneumonia. Urine analysis also confirming infection.  PAST MEDICAL HISTORY:   Past Medical History  Diagnosis Date  . Barrett esophagus   . Hypertension   . Hyperlipidemia   . Multiple pulmonary nodules   . CHF (congestive heart failure) ([place])   . Lumbar spinal stenosis   . COPD (chronic obstructive pulmonary disease) ([place])   . CAD (coronary artery disease)     on 2l home oxygen  . On home oxygen therapy   . DVT (deep venous thrombosis) ([place]) 11/2013; 02/01/2015     on coumadin  . Parkinson's disease ([place])   . Recurrent aspiration pneumonia ([place])   . Sleep apnea   . Hypothyroidism   . Type II diabetes mellitus ([place])   . Iron deficiency anemia   . GERD (gastroesophageal reflux disease)   . Seizures ([place])   . DJD (degenerative joint disease)   . Arthritis   . Chronic lower back pain   . History of gout   . Depression   . Anxiety   . Basal cell carcinoma   . Throat cancer ([place])   . Heart attack ([place]) 03/20/1995  . 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 Anterior cervical decomp/discectomy fusion  X 2  . Radical neck dissection Right ~ 1998    "throat cancer"  . Total knee arthroplasty Right 2011  . Thyroidectomy  ~ 1996 X 2  . Cataract extraction w/ intraocular lens  implant, bilateral Bilateral   . Tonsillectomy    . Excisional hemorrhoidectomy    . Joint replacement    . Back surgery    . Posterior fusion cervical spine  X 1  . Coronary angioplasty with stent placement    . Coronary artery bypass graft  1996    "CABG X3"  . Basal cell carcinoma excision      SOCIAL HISTORY:   Social History  Substance Use Topics  . Smoking status: Former  Smoker -- 2.00 packs/day for 35 years    Types: Cigarettes    Quit date: 03/20/1995  . Smokeless tobacco: Never Used  . Alcohol Use: Yes     Comment: 02/01/2015 "stopped drinking in ~ 1996; never had problem w/it"    FAMILY HISTORY:   Family History  Problem Relation Age of Onset  . Leukemia Paternal Grandmother   . Cancer Maternal Grandmother     stomach  . Cancer Paternal Aunt   . Other Father     bacterial endocarditis  . Rheum arthritis Father     DRUG ALLERGIES:   Allergies  Allergen Reactions  . Other Other (See Comments)    Dizziness, lightheadedness, Pt states that inhaled medications make him depressed and have negative thoughts.      REVIEW  OF SYSTEMS:   Review of Systems  Constitutional: Positive for malaise/fatigue. Negative for fever, chills and weight loss.  HENT: Negative for ear discharge, ear pain, nosebleeds and tinnitus.   Eyes: Negative for blurred vision, double vision and photophobia.  Respiratory: Positive for cough and shortness of breath. Negative for hemoptysis and wheezing.   Cardiovascular: Negative for chest pain, palpitations, orthopnea and leg swelling.  Gastrointestinal: Negative for heartburn, nausea, vomiting, abdominal pain, diarrhea, constipation and melena.  Genitourinary: Negative for dysuria and urgency.  Musculoskeletal: Positive for myalgias. Negative for back pain and neck pain.  Skin: Negative for rash.  Neurological: Positive for tremors. Negative for dizziness, speech change, focal weakness and headaches.  Endo/Heme/Allergies: Does not bruise/bleed easily.  Psychiatric/Behavioral: Negative for depression.    MEDICATIONS AT HOME:   Prior to Admission medications   Medication Sig Start Date End Date Taking? Authorizing Provider  Artificial Tear Ointment (DRY EYES OP) Place 1 drop into both eyes daily as needed (dry eyes).    Historical Provider, MD  aspirin EC 81 MG tablet Take 81 mg by mouth at bedtime.     Historical Provider, MD  atorvastatin (LIPITOR) 40 MG tablet Take 40 mg by mouth at bedtime.    Historical Provider, MD  bisacodyl (DULCOLAX) 10 MG suppository Place 1 suppository (10 mg total) rectally daily as needed for moderate constipation. 02/01/15   Tama High III, MD  carbidopa-levodopa (SINEMET IR) 25-100 MG per tablet Take 3 tablets by mouth 3 (three) times daily.     Historical Provider, MD  cefTRIAXone (ROCEPHIN) 40 MG/ML IVPB Inject 50 mLs (2 g total) into the vein daily. Stop after last dose on 03/30/15 02/08/15   Orson Eva, MD  clonazePAM (KLONOPIN) 0.5 MG tablet Take 0.5 mg by mouth every morning. 08/23/12   Historical Provider, MD  clonazePAM (KLONOPIN) 1 MG tablet Take 1  mg by mouth at bedtime. 09/20/14   Historical Provider, MD  docusate sodium (COLACE) 100 MG capsule Take 1 capsule (100 mg total) by mouth 2 (two) times daily. Patient not taking: Reported on 03/06/2015 02/01/15   Adin Hector, MD  fentaNYL (DURAGESIC - DOSED MCG/HR) 50 MCG/HR Place 1 patch (50 mcg total) onto the skin every 3 (three) days. 02/08/15   Orson Eva, MD  lamoTRIgine (LAMICTAL) 25 MG tablet Take 25 mg by mouth 2 (two) times daily.    Historical Provider, MD  levothyroxine (SYNTHROID, LEVOTHROID) 137 MCG tablet Take 137 mcg by mouth daily.    Historical Provider, MD  losartan (COZAAR) 25 MG tablet Take 1 tablet (25 mg total) by mouth daily. 03/08/15   Tama High III, MD  metFORMIN (GLUCOPHAGE) 500 MG tablet  Take 500 mg by mouth 2 (two) times daily. 12/12/14   Historical Provider, MD  mometasone-formoterol (DULERA) 200-5 MCG/ACT AERO Inhale 2 puffs into the lungs 2 (two) times daily. Patient not taking: Reported on 03/06/2015 02/01/15   Adin Hector, MD  Multiple Vitamin (MULTI-VITAMINS) TABS Take 1 tablet by mouth daily. Take the same brand of multivitamins while on warfarin (coumadin).  If multivitamin product is changed, let your provider know.  Have INR checked in 3-5 days after taking new product. 09/24/10   Historical Provider, MD  nitroGLYCERIN (NITROSTAT) 0.4 MG SL tablet Place 0.4 mg under the tongue every 5 (five) minutes x 3 doses as needed for chest pain.     Historical Provider, MD  Nutritional Supplements (FEEDING SUPPLEMENT, JEVITY 1.5 CAL/FIBER,) LIQD Place 355 mLs into feeding tube 4 (four) times daily. 362mL = 1.5 cans 02/08/15   Orson Eva, MD  omeprazole (PRILOSEC) 2 mg/mL SUSP Place 10 mLs (20 mg total) into feeding tube 2 (two) times daily before a meal. 03/08/15   Tama High III, MD  oxyCODONE-acetaminophen (PERCOCET/ROXICET) 5-325 MG per tablet Take 1 tablet by mouth every 6 (six) hours as needed for severe pain. 02/01/15   Tama High III, MD  PARoxetine (PAXIL) 20 MG  tablet Take 20 mg by mouth 3 (three) times daily. 07/30/10   Historical Provider, MD  predniSONE (DELTASONE) 20 MG tablet Take 1 tablet (20 mg total) by mouth daily with breakfast. Patient not taking: Reported on 03/06/2015 02/01/15   Tama High III, MD  tamsulosin (FLOMAX) 0.4 MG CAPS Take 0.4 mg by mouth daily.     Historical Provider, MD  warfarin (COUMADIN) 5 MG tablet Take 1 tablet (5 mg total) by mouth daily. 03/08/15   Adin Hector, MD  Water For Irrigation, Sterile (FREE WATER) SOLN Place 125 mLs into feeding tube 4 (four) times daily. Before AND after each tube feed administration 02/08/15   Orson Eva, MD      VITAL SIGNS:  Blood pressure 131/77, pulse 68, temperature 98.5 F (36.9 C), temperature source Oral, resp. rate 18, height 5\' 5"  (1.651 m), weight 68.04 kg (150 lb), SpO2 98 %.  PHYSICAL EXAMINATION:   Physical Exam  GENERAL:  69 y.o.-year-old patient lying in the bed with no acute distress.  EYES: Pupils equal, round, reactive to light and accommodation. No scleral icterus. Extraocular muscles intact.  HEENT: Head atraumatic, normocephalic. Oropharynx and nasopharynx clear. Has tremors of the lips noted. NECK:  Supple, no jugular venous distention. No thyroid enlargement, no tenderness.  LUNGS: Normal breath sounds bilaterally, no wheezing, rales or crepitation. No use of accessory muscles of respiration. Right basilar rhonchi posteriorly on auscultation. CARDIOVASCULAR: S1, S2 normal. No murmurs, rubs, or gallops.  ABDOMEN: Soft, nontender, nondistended. Bowel sounds present. No organomegaly or mass.  EXTREMITIES: No pedal edema, cyanosis, or clubbing.  NEUROLOGIC: Cranial nerves II through XII are intact. Muscle strength 5/5 in all extremities. Sensation intact. Gait not checked.  PSYCHIATRIC: The patient is alert and oriented x 3.  SKIN: No obvious rash, lesion, or ulcer.   LABORATORY PANEL:   CBC  Recent Labs Lab 11/29/15 1512  WBC 16.4*  HGB 10.7*  HCT 33.8*   PLT 223   ------------------------------------------------------------------------------------------------------------------  Chemistries   Recent Labs Lab 11/29/15 1512  NA 136  K 4.5  CL 96*  CO2 34*  GLUCOSE 128*  BUN 19  CREATININE 0.80  CALCIUM 8.6*  AST 14*  ALT 13*  ALKPHOS 66  BILITOT 0.5   ------------------------------------------------------------------------------------------------------------------  Cardiac Enzymes  Recent Labs Lab 11/29/15 1512  TROPONINI 0.04*   ------------------------------------------------------------------------------------------------------------------  RADIOLOGY:  Dg Elbow 2 Views Right  11/29/2015  CLINICAL DATA:  Elbow pain after falling today. History of Parkinson's disease. Initial encounter. EXAM: RIGHT ELBOW - 2 VIEW COMPARISON:  None. FINDINGS: The mineralization and alignment are normal. There is no evidence of acute fracture or dislocation. The joint spaces are maintained. There is mild spurring of the humeral epicondyles and coronoid process. No elbow joint effusion or focal soft tissue swelling identified. IMPRESSION: No acute osseous findings demonstrated.  Mild degenerative spurring. Electronically Signed   By: Richardean Sale M.D.   On: 11/29/2015 15:30   Ct Head Wo Contrast  11/29/2015  CLINICAL DATA:  [place] today, on Coumadin, history stroke, throat cancer, hypertension, cervical spine fusion, CHF, COPD, coronary artery disease post MI, Parkinson's, type II diabetes mellitus, former smoker EXAM: CT HEAD WITHOUT CONTRAST CT CERVICAL SPINE WITHOUT CONTRAST TECHNIQUE: Multidetector CT imaging of the head and cervical spine was performed following the standard protocol without intravenous contrast. Multiplanar CT image reconstructions of the cervical spine were also generated. COMPARISON:  CT head 10/29/2015 ; correlation PET-CT 02/21/2013 FINDINGS: CT HEAD FINDINGS Generalized atrophy. Normal ventricular morphology. No midline  shift or mass effect. Normal appearance of brain parenchyma. No intracranial hemorrhage, mass lesion, or evidence acute infarction. No extra-axial fluid collections. Atherosclerotic calcifications at skullbase. Visualized paranasal sinuses and mastoid air cells clear. Nasal septal deviation to the RIGHT. Skull intact. CT CERVICAL SPINE FINDINGS Prior anterior fusion C3-C5 with residual anterior hardware at C3-C4. Disc space narrowing with endplate spur formation C5-C6 and C6-C7. Diffuse osseous demineralization. Vertebral body heights maintained without fracture or subluxation. Mild scattered facet degenerative changes. Visualized skullbase intact. Extensive atherosclerotic calcification in the carotid systems. Lung apices clear. Asymmetric soft tissue at the RIGHT piriform sinus extending to lateral aspect of the larynx. This appears grossly unchanged since CT images from a PET-CT from 2014, question sequela of therapy and can be assessed by followup direct visualization. IMPRESSION: Generalized atrophy. No acute intracranial abnormalities. Scattered mild degenerative changes of the cervical spine with postoperative changes of anterior cervical fusion C3-C5. No acute cervical spine abnormalities. Asymmetric soft tissue at RIGHT perform sinus into RIGHT larynx, could be related to prior treatment of throat cancer and is grossly unchanged since 2014 coma recommend followup direct visualization to exclude residual tumor. Electronically Signed   By: Lavonia Dana M.D.   On: 11/29/2015 16:20   Ct Cervical Spine Wo Contrast  11/29/2015  CLINICAL DATA:  [place] today, on Coumadin, history stroke, throat cancer, hypertension, cervical spine fusion, CHF, COPD, coronary artery disease post MI, Parkinson's, type II diabetes mellitus, former smoker EXAM: CT HEAD WITHOUT CONTRAST CT CERVICAL SPINE WITHOUT CONTRAST TECHNIQUE: Multidetector CT imaging of the head and cervical spine was performed following the standard protocol  without intravenous contrast. Multiplanar CT image reconstructions of the cervical spine were also generated. COMPARISON:  CT head 10/29/2015 ; correlation PET-CT 02/21/2013 FINDINGS: CT HEAD FINDINGS Generalized atrophy. Normal ventricular morphology. No midline shift or mass effect. Normal appearance of brain parenchyma. No intracranial hemorrhage, mass lesion, or evidence acute infarction. No extra-axial fluid collections. Atherosclerotic calcifications at skullbase. Visualized paranasal sinuses and mastoid air cells clear. Nasal septal deviation to the RIGHT. Skull intact. CT CERVICAL SPINE FINDINGS Prior anterior fusion C3-C5 with residual anterior hardware at C3-C4. Disc space narrowing with endplate spur formation C5-C6 and C6-C7. Diffuse osseous  demineralization. Vertebral body heights maintained without fracture or subluxation. Mild scattered facet degenerative changes. Visualized skullbase intact. Extensive atherosclerotic calcification in the carotid systems. Lung apices clear. Asymmetric soft tissue at the RIGHT piriform sinus extending to lateral aspect of the larynx. This appears grossly unchanged since CT images from a PET-CT from 2014, question sequela of therapy and can be assessed by followup direct visualization. IMPRESSION: Generalized atrophy. No acute intracranial abnormalities. Scattered mild degenerative changes of the cervical spine with postoperative changes of anterior cervical fusion C3-C5. No acute cervical spine abnormalities. Asymmetric soft tissue at RIGHT perform sinus into RIGHT larynx, could be related to prior treatment of throat cancer and is grossly unchanged since 2014 coma recommend followup direct visualization to exclude residual tumor. Electronically Signed   By: Lavonia Dana M.D.   On: 11/29/2015 16:20   Dg Chest Port 1 View  11/29/2015  CLINICAL DATA:  Fall in kitchen today. Sepsis. Increased weakness and poor intake for the last 3-4 days. EXAM: PORTABLE CHEST 1 VIEW  COMPARISON:  Two-view chest x-ray 10/29/2015 FINDINGS: The heart is mildly enlarged. Median sternotomy for CABG is noted. Atherosclerotic calcifications of the aorta are again noted. Emphysematous changes are present. The knee right middle lobe pneumonia is present. Scarring or atelectasis at the left base is stable. The visualized soft tissues and bony thorax are unremarkable. IMPRESSION: 1. New right middle lobe pneumonia. 2. Emphysema. 3. Borderline cardiomegaly without failure. 4. Atherosclerosis of the thoracic aorta. Electronically Signed   By: San Morelle M.D.   On: 11/29/2015 15:31    EKG:   Orders placed or performed during the hospital encounter of 11/29/15  . EKG 12-Lead  . EKG 12-Lead  . EKG 12-Lead  . EKG 12-Lead    IMPRESSION AND PLAN:   Alexanderjames Ida  is a 70 y.o. male with a known history of Hypertension, hyperlipidemia, Parkinson's disease with dysphagia and recurrent aspiration pneumonias, sleep apnea, seizure disorder, chronic low back pain, history of discitis and vertebral osteomyelitis September 2016 secondary to group B streptococcus bacteremia, DVT on chronic Coumadin therapy presents from home secondary to worsening cough, weakness and disorientation.  #1 aspiration pneumonia-new right middle lobe pneumonia. Most likely aspiration pneumonia. -Started on dysphagia diet with honey thick liquids. Speech eval. Patient strongly advised to adhere to thickened liquids. -Continue Zosyn. -Follow up chest x-ray as outpatient to ensure clearing of pneumonia -Follow blood cultures.  #2 UTI-follow-up cultures. Patient is already on Zosyn  #3 elevated troponin-likely demand ischemia from infection. Monitor and recycle troponins. Patient has history of CAD and CABG. No active chest pain. No EKG changes.  #4 history of DVT-on Coumadin. Pharmacy to adjust the dose.  #5 hypertension-continue losartan  #6 Parkinson's disease-continue home medications.  #7 DVT  prophylaxis-already on Coumadin   Physical therapy consult requested    All the records are reviewed and case discussed with ED provider. Management plans discussed with the patient, family and they are in agreement.  CODE STATUS: Full Code  TOTAL TIME TAKING CARE OF THIS PATIENT: 50 minutes.    Gladstone Lighter M.D on 11/29/2015 at 6:12 PM  Between 7am to 6pm - Pager - [phone]  After 6pm go to [email or web address] - password [email or web address] [facility]  [facility]  Office  [phone]  CC: Primary care physician; Adin Hector, MD

## 2015-11-29 NOTE — Progress Notes (Signed)
Pharmacy Antibiotic Note  John Mendoza is a 70 y.o. male admitted on 11/29/2015 with aspiration pneumonia.  Pharmacy has been consulted for Zosyn dosing.  Plan: Zosyn 3.375g IV q8h (4 hour infusion).  Height: 5\' 5"  (165.1 cm) Weight: 150 lb (68.04 kg) IBW/kg (Calculated) : 61.5  Temp (24hrs), Avg:98.6 F (37 C), Min:98.5 F (36.9 C), Max:98.6 F (37 C)   Recent Labs Lab 11/29/15 1512  WBC 16.4*  CREATININE 0.80  LATICACIDVEN 1.0    Estimated Creatinine Clearance: 74.7 mL/min (by C-G formula based on Cr of 0.8).    Allergies  Allergen Reactions  . Other Other (See Comments)    Dizziness, lightheadedness, Pt states that inhaled medications make him depressed and have negative thoughts.      Antimicrobials this admission: 5/25 Zosyn >>    Microbiology results: 5/25 BCx: pending 5/25 UCx: pending   Thank you for allowing pharmacy to be a part of this patient's care.  John Mendoza G 11/29/2015 8:18 PM

## 2015-11-29 NOTE — ED Provider Notes (Signed)
[facility] Emergency Department Provider Note  ____________________________________________  Time seen: Approximately 2:49 PM  I have reviewed the triage vital signs and the nursing notes.   HISTORY  Chief Complaint Fall    HPI John Mendoza is a 70 y.o. 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  He presents by EMS for evaluation after a fall today.  He is a poor historian and does not remember for sure when or why he fell, just that he was walking to the kitchen and then he was on the ground.  Thus the onset of the fall was acute.  However, EMS reports that he was not on his home oxygen when they arrived and found him.  He placed him on oxygen immediately and he was satting 95% when he arrived to the ED.  He is awake and alert but confused about the events that occurred today.  His wife provided additional history.  She states that over the last week he has had increased generalized weakness, has been dragging his feet more at home when ambulating, and has not been eating or drinking well.  He has apparently had multiple falls recently, but this was the worst one.  His only complaint at this time is mild to moderate right elbow pain but he is moving the elbow without any difficulty and it does not make the pain worse.  He does have chronic back pain which she feels is about the same as usual.  He states that he has a chronic tremor and some chronic oral dyskinesia.  He is not sure if he lost consciousness but denies headache and neck pain.  He denies fever/chills, chest pain, shortness of breath, nausea, vomiting, diarrhea.   Past Medical  History  Diagnosis Date  . Barrett esophagus   . Hypertension   . Hyperlipidemia   . Multiple pulmonary nodules   . CHF (congestive heart failure) ([place])   . Lumbar spinal stenosis   . COPD (chronic obstructive pulmonary disease) ([place])   . CAD (coronary artery disease)     on 2l home oxygen  . On home oxygen therapy   . DVT (deep venous thrombosis) ([place]) 11/2013; 02/01/2015    on coumadin  . Parkinson's disease ([place])   . Recurrent aspiration pneumonia ([place])   . Sleep apnea   . Hypothyroidism   . Type II diabetes mellitus ([place])   . Iron deficiency anemia   . GERD (gastroesophageal reflux disease)   . Seizures ([place])   . DJD (degenerative joint disease)   . Arthritis   . Chronic lower back pain   . History of gout   . Depression   . Anxiety   . Basal cell carcinoma   . Throat cancer ([place])   . Heart attack ([place]) 03/20/1995  . 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 Aspiration pneumonia ([place]) 11/29/2015  . Dehydration 03/07/2015  . Orthostatic hypotension 03/06/2015  . Hypotension 03/06/2015  . Constipation 02/05/2015  . Dysphagia causing pulmonary aspiration with swallowing 02/03/2015  . Diskitis   . 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  Discitis of lumbar region 02/01/2015  . Epidural abscess 02/01/2015  . Warfarin anticoagulation 02/01/2015  . Parkinsons disease ([place]) 02/01/2015  . Vertebral osteomyelitis ([place]) 02/01/2015  . Dysphagia/chronic 02/01/2015  . Bacteremia due to group B Streptococcus 01/28/2015  . Acute on chronic respiratory failure with hypoxia ([place]) 01/26/2015  . Sepsis ([place]) 01/26/2015  . History of DVT (deep vein thrombosis) 02/20/2014  . 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 Multiple pulmonary nodules 01/15/2012  . Aspiration pneumonitis ([place]) 01/15/2012  . Hypertension 01/15/2012  . Barrett's  esophagus 01/15/2012  . CAD (coronary artery disease) 01/15/2012  . COPD (chronic obstructive pulmonary disease) ([place]) 01/15/2012    Past Surgical History  Procedure Laterality Date  . Anterior cervical decomp/discectomy fusion  X 2  . Radical neck dissection Right ~ 1998    "throat cancer"  . Total knee arthroplasty Right 2011  . Thyroidectomy  ~ 1996 X 2  . Cataract extraction w/ intraocular lens  implant, bilateral Bilateral   . Tonsillectomy    . Excisional hemorrhoidectomy    . Joint replacement    . Back surgery    . Posterior fusion cervical spine  X 1  . Coronary angioplasty with stent placement    . Coronary artery bypass graft  1996    "CABG X3"  . Basal cell carcinoma excision      No current outpatient prescriptions on file.  Allergies Other  Family History  Problem Relation Age of Onset  . Leukemia Paternal Grandmother   . Cancer Maternal Grandmother     stomach  . Cancer Paternal Aunt   . Other Father     bacterial endocarditis  . Rheum arthritis Father     Social History Social History  Substance Use Topics  . Smoking status: Former Smoker -- 2.00 packs/day for 35 years    Types: Cigarettes    Quit date: 03/20/1995  . Smokeless tobacco: Never Used  . Alcohol Use: Yes     Comment: 02/01/2015 "stopped drinking in ~ 1996; never had problem w/it"    Review of Systems Constitutional: No fever/chills.  Generalized weakness and fatigue over the last week Eyes: No visual changes. ENT: No sore throat. Cardiovascular: Denies chest pain. Respiratory: Denies shortness of breath. Gastrointestinal: No abdominal pain.  No nausea, no vomiting.  No diarrhea.  No constipation.  Decreased by mouth intake recently Genitourinary: Negative for dysuria. Musculoskeletal: Chronic back pain, acute right elbow pain Skin: Negative for rash. Neurological: Chronic weakness and resting tremor from Parkinson's disease, increased shuffling gait  10-point ROS otherwise  negative.  ____________________________________________   PHYSICAL EXAM:  VITAL SIGNS: ED Triage Vitals  Enc Vitals Group     BP 11/29/15 1444 127/78 mmHg     Pulse Rate 11/29/15 1444 74     Resp 11/29/15 1444 18     Temp 11/29/15 1444 98.5 F (36.9 C)     Temp Source 11/29/15 1444 Oral     SpO2 11/29/15 1444 95 %     Weight 11/29/15 1444 150 lb (68.04 kg)     Height 11/29/15 1444 5\' 5"  (1.651 m)     Head Cir --      Peak Flow --      Pain Score 11/29/15 1447 4     Pain Loc --      Pain Edu? --      Excl. in [place]? --     Constitutional: Alert. No acute distress.  Mildly confused but still able to joke with  me Eyes: Conjunctivae are normal. PERRL. EOMI. Head: Atraumatic without evidence of contusion Nose: No congestion/rhinnorhea. Mouth/Throat: Mucous membranes are moist.  Oropharynx non-erythematous. Neck: No stridor.  No meningeal signs.  No cervical spine tenderness to palpation. Cardiovascular: Normal rate, regular rhythm. Good peripheral circulation. Grossly normal heart sounds.  Old sternotomy scar. Respiratory: Normal respiratory effort.  No retractions. Lungs CTAB. Gastrointestinal: Soft and nontender. No distention. Old abdominal scars from PEG tube, well-healed. Musculoskeletal: No lower extremity tenderness nor edema. No gross deformities of extremities.  Full range of motion of right elbow without any gross deformity or tenderness to palpation.  No step-offs or deformities all the way down his spine with no specific areas of tenderness and no abscesses or areas of erythema visible on the outside Neurologic:  Chronic oral dyskinesia.  Mild resting tremor.  No cogwheel rigidity of his limbs with passive range of motion.  Skin:  Skin is warm, mildly diaphoretic and intact. No rash noted.  Several old ecchymoses   ____________________________________________   LABS (all labs ordered are listed, but only abnormal results are displayed)  Labs Reviewed  COMPREHENSIVE  METABOLIC PANEL - Abnormal; Notable for the following:    Chloride 96 (*)    CO2 34 (*)    Glucose, Bld 128 (*)    Calcium 8.6 (*)    AST 14 (*)    ALT 13 (*)    All other components within normal limits  TROPONIN I - Abnormal; Notable for the following:    Troponin I 0.04 (*)    All other components within normal limits  CBC WITH DIFFERENTIAL/PLATELET - Abnormal; Notable for the following:    WBC 16.4 (*)    RBC 4.18 (*)    Hemoglobin 10.7 (*)    HCT 33.8 (*)    MCH 25.7 (*)    MCHC 31.7 (*)    RDW 17.0 (*)    Neutro Abs 15.5 (*)    Lymphs Abs 0.3 (*)    All other components within normal limits  APTT - Abnormal; Notable for the following:    aPTT 78 (*)    All other components within normal limits  PROTIME-INR - Abnormal; Notable for the following:    Prothrombin Time 33.0 (*)    All other components within normal limits  URINALYSIS COMPLETEWITH MICROSCOPIC ([facility] ONLY) - Abnormal; Notable for the following:    Color, Urine AMBER (*)    APPearance CLEAR (*)    Ketones, ur TRACE (*)    Protein, ur 100 (*)    Leukocytes, UA 2+ (*)    Bacteria, UA RARE (*)    Squamous Epithelial / LPF 0-5 (*)    All other components within normal limits  CULTURE, BLOOD (ROUTINE X 2)  CULTURE, BLOOD (ROUTINE X 2)  URINE CULTURE  URINE CULTURE  LACTIC ACID, PLASMA  LACTIC ACID, PLASMA  LIPASE, BLOOD  CK  PROTIME-INR  BASIC METABOLIC PANEL  CBC   ____________________________________________  EKG  ED ECG REPORT I, Derenda Giddings, the attending physician, personally viewed and interpreted this ECG.  Date: 11/29/2015 EKG Time: 15:42 Rate: 69 Rhythm: normal sinus rhythm QRS Axis: normal Intervals: normal with left ventricular hypertrophy ST/T Wave abnormalities: T wave inversions in leads V5 and V6 Conduction Disturbances: none Narrative Interpretation: unremarkable  ____________________________________________  RADIOLOGY   Dg Elbow 2 Views Right  11/29/2015  CLINICAL  DATA:  Elbow pain after falling today. History of Parkinson's disease. Initial encounter. EXAM: RIGHT ELBOW - 2 VIEW COMPARISON:  None. FINDINGS: The mineralization and alignment are normal. There is no evidence of acute fracture or dislocation. The joint spaces are maintained. There is mild spurring of the humeral epicondyles and coronoid process. No elbow joint effusion or focal soft tissue swelling identified. IMPRESSION: No acute osseous findings demonstrated.  Mild degenerative spurring. Electronically Signed   By: Richardean Sale M.D.   On: 11/29/2015 15:30   Ct Head Wo Contrast  11/29/2015  CLINICAL DATA:  [place] today, on Coumadin, history stroke, throat cancer, hypertension, cervical spine fusion, CHF, COPD, coronary artery disease post MI, Parkinson's, type II diabetes mellitus, former smoker EXAM: CT HEAD WITHOUT CONTRAST CT CERVICAL SPINE WITHOUT CONTRAST TECHNIQUE: Multidetector CT imaging of the head and cervical spine was performed following the standard protocol without intravenous contrast. Multiplanar CT image reconstructions of the cervical spine were also generated. COMPARISON:  CT head 10/29/2015 ; correlation PET-CT 02/21/2013 FINDINGS: CT HEAD FINDINGS Generalized atrophy. Normal ventricular morphology. No midline shift or mass effect. Normal appearance of brain parenchyma. No intracranial hemorrhage, mass lesion, or evidence acute infarction. No extra-axial fluid collections. Atherosclerotic calcifications at skullbase. Visualized paranasal sinuses and mastoid air cells clear. Nasal septal deviation to the RIGHT. Skull intact. CT CERVICAL SPINE FINDINGS Prior anterior fusion C3-C5 with residual anterior hardware at C3-C4. Disc space narrowing with endplate spur formation C5-C6 and C6-C7. Diffuse osseous demineralization. Vertebral body heights maintained without fracture or subluxation. Mild scattered facet degenerative changes. Visualized skullbase intact. Extensive atherosclerotic  calcification in the carotid systems. Lung apices clear. Asymmetric soft tissue at the RIGHT piriform sinus extending to lateral aspect of the larynx. This appears grossly unchanged since CT images from a PET-CT from 2014, question sequela of therapy and can be assessed by followup direct visualization. IMPRESSION: Generalized atrophy. No acute intracranial abnormalities. Scattered mild degenerative changes of the cervical spine with postoperative changes of anterior cervical fusion C3-C5. No acute cervical spine abnormalities. Asymmetric soft tissue at RIGHT perform sinus into RIGHT larynx, could be related to prior treatment of throat cancer and is grossly unchanged since 2014 coma recommend followup direct visualization to exclude residual tumor. Electronically Signed   By: Lavonia Dana M.D.   On: 11/29/2015 16:20   Ct Cervical Spine Wo Contrast  11/29/2015  CLINICAL DATA:  [place] today, on Coumadin, history stroke, throat cancer, hypertension, cervical spine fusion, CHF, COPD, coronary artery disease post MI, Parkinson's, type II diabetes mellitus, former smoker EXAM: CT HEAD WITHOUT CONTRAST CT CERVICAL SPINE WITHOUT CONTRAST TECHNIQUE: Multidetector CT imaging of the head and cervical spine was performed following the standard protocol without intravenous contrast. Multiplanar CT image reconstructions of the cervical spine were also generated. COMPARISON:  CT head 10/29/2015 ; correlation PET-CT 02/21/2013 FINDINGS: CT HEAD FINDINGS Generalized atrophy. Normal ventricular morphology. No midline shift or mass effect. Normal appearance of brain parenchyma. No intracranial hemorrhage, mass lesion, or evidence acute infarction. No extra-axial fluid collections. Atherosclerotic calcifications at skullbase. Visualized paranasal sinuses and mastoid air cells clear. Nasal septal deviation to the RIGHT. Skull intact. CT CERVICAL SPINE FINDINGS Prior anterior fusion C3-C5 with residual anterior hardware at C3-C4. Disc  space narrowing with endplate spur formation C5-C6 and C6-C7. Diffuse osseous demineralization. Vertebral body heights maintained without fracture or subluxation. Mild scattered facet degenerative changes. Visualized skullbase intact. Extensive atherosclerotic calcification in the carotid systems. Lung apices clear. Asymmetric soft tissue at the RIGHT piriform sinus extending to lateral aspect of the larynx. This appears grossly unchanged since CT images from a PET-CT from 2014, question sequela  of therapy and can be assessed by followup direct visualization. IMPRESSION: Generalized atrophy. No acute intracranial abnormalities. Scattered mild degenerative changes of the cervical spine with postoperative changes of anterior cervical fusion C3-C5. No acute cervical spine abnormalities. Asymmetric soft tissue at RIGHT perform sinus into RIGHT larynx, could be related to prior treatment of throat cancer and is grossly unchanged since 2014 coma recommend followup direct visualization to exclude residual tumor. Electronically Signed   By: Lavonia Dana M.D.   On: 11/29/2015 16:20   Dg Chest Port 1 View  11/29/2015  CLINICAL DATA:  Fall in kitchen today. Sepsis. Increased weakness and poor intake for the last 3-4 days. EXAM: PORTABLE CHEST 1 VIEW COMPARISON:  Two-view chest x-ray 10/29/2015 FINDINGS: The heart is mildly enlarged. Median sternotomy for CABG is noted. Atherosclerotic calcifications of the aorta are again noted. Emphysematous changes are present. The knee right middle lobe pneumonia is present. Scarring or atelectasis at the left base is stable. The visualized soft tissues and bony thorax are unremarkable. IMPRESSION: 1. New right middle lobe pneumonia. 2. Emphysema. 3. Borderline cardiomegaly without failure. 4. Atherosclerosis of the thoracic aorta. Electronically Signed   By: San Morelle M.D.   On: 11/29/2015 15:31    ____________________________________________   PROCEDURES  Procedure(s)  performed: None  Critical Care performed: No ____________________________________________   INITIAL IMPRESSION / ASSESSMENT AND PLAN / ED COURSE  Pertinent labs & imaging results that were available during my care of the patient were reviewed by me and considered in my medical decision making (see chart for details).  The patient does not meet sepsis criteria upon arrival but I am concerned that he may have an infection which is lead to his decreased status over the last few days.  He feels extremely warm and is diaphoretic.  I will evaluate him thoroughly as if he is septic without calling a code substance this point since I have no criteria upon which to base this.  I will start a small fluid bolus of 500 mL and we will hold on empiric antibiotics.  I am going to obtain a CT scan of his head and neck given his fall and the fact that he reports hitting his head and he takes warfarin.  I will also check a radiograph of his right elbow.  ----------------------------------------- 5:22 PM on 11/29/2015 -----------------------------------------  The patient has a new right middle lobe pneumonia and he also has a UTI.  Given his long history of aspiration pneumonia, I am covering him with Zosyn 4.5 g IV every 8 hours with the first dose in the emergency department.  Cultures have are not been sent.  His lactate is normal.  He does have a leukocytosis but he meets no other criteria for sepsis.  I will give him additional gentle fluids.  I have spoken with the family and will admit to the hospitalist.  ______________________________________  FINAL CLINICAL IMPRESSION(S) / ED DIAGNOSES  Final diagnoses:  CAP (community acquired pneumonia)  UTI (lower urinary tract infection)  Fall, initial encounter  Parkinsons disease ([place])  Warfarin anticoagulation  Elevated troponin I level     MEDICATIONS GIVEN DURING THIS VISIT:  Medications  sodium chloride 0.9 % bolus 500 mL (0 mLs Intravenous  Stopped 11/29/15 1725)  piperacillin-tazobactam (ZOSYN) IVPB 4.5 g (0 g Intravenous Stopped 11/29/15 1813)     NEW OUTPATIENT MEDICATIONS STARTED DURING THIS VISIT:  Current Discharge Medication List        Note:  This document was prepared using  [employer] forces training and education officer and may include unintentional dictation errors.   Hinda Kehr, MD 11/29/15 [phone]

## 2015-11-30 LAB — PROTIME-INR
INR: 2.8
PROTHROMBIN TIME: 29.1 s — AB (ref 11.4–15.0)

## 2015-11-30 LAB — CBC
HCT: 30 % — ABNORMAL LOW (ref 40.0–52.0)
HEMOGLOBIN: 9.7 g/dL — AB (ref 13.0–18.0)
MCH: 26.3 pg (ref 26.0–34.0)
MCHC: 32.3 g/dL (ref 32.0–36.0)
MCV: 81.4 fL (ref 80.0–100.0)
PLATELETS: 183 10*3/uL (ref 150–440)
RBC: 3.69 MIL/uL — ABNORMAL LOW (ref 4.40–5.90)
RDW: 16.9 % — ABNORMAL HIGH (ref 11.5–14.5)
WBC: 10.1 10*3/uL (ref 3.8–10.6)

## 2015-11-30 LAB — BASIC METABOLIC PANEL
ANION GAP: 4 — AB (ref 5–15)
BUN: 15 mg/dL (ref 6–20)
CALCIUM: 8.3 mg/dL — AB (ref 8.9–10.3)
CO2: 35 mmol/L — AB (ref 22–32)
CREATININE: 0.82 mg/dL (ref 0.61–1.24)
Chloride: 100 mmol/L — ABNORMAL LOW (ref 101–111)
GFR calc Af Amer: >60 mL/min (ref >60)
GLUCOSE: 100 mg/dL — AB (ref 65–99)
Potassium: 4.4 mmol/L (ref 3.5–5.1)
Sodium: 139 mmol/L (ref 135–145)

## 2015-11-30 LAB — URINE CULTURE: Culture: NO GROWTH

## 2015-11-30 MED ORDER — CETYLPYRIDINIUM CHLORIDE 0.05 % MT LIQD: 7.0000 mL | OROMUCOSAL | Status: DC | PRN

## 2015-11-30 MED ORDER — WARFARIN SODIUM 4 MG PO TABS
4.0000 mg | ORAL_TABLET | Freq: Once | ORAL | Status: AC
  Administered 2015-11-30: 4 mg via ORAL
  Filled 2015-11-30: qty 1

## 2015-11-30 NOTE — Evaluation (Signed)
Physical Therapy Evaluation Patient Details Name: John Mendoza MRN: [id number] DOB: 02-17-1946 Today's Date: 11/30/2015   History of Present Illness  70 y/o male here with aspiration pneumonia.  He has had multiple bouts of this in the past, reports being careful with food and beverage.  Pt has Parkinson's, chronic LBP and 2-3 liters O2 use.  Clinical Impression  Pt is able to ambulate well with no LOBs and only limited use of the cane.  He shows good confidence and has no reports of fatigue walking with and w/o supplemental O2 with sats dropping to the high 80s.  Pt reports he is essentially at his baseline and agrees with PT that he does not require further PT intervention.    Follow Up Recommendations No PT follow up    Equipment Recommendations       Recommendations for Other Services       Precautions / Restrictions Precautions Precautions: Fall Restrictions Weight Bearing Restrictions: No      Mobility  Bed Mobility Overal bed mobility: Independent             General bed mobility comments: Pt able to get to sitting EOB (and back to supine) w/o assist,   Transfers Overall transfer level: Independent Equipment used: Straight cane             General transfer comment: Pt is able to rise to standing with no balance/safety issues.   Ambulation/Gait Ambulation/Gait assistance: Modified independent (Device/Increase time) Ambulation Distance (Feet): 200 Feet Assistive device: Straight cane       General Gait Details: Pt ambulates well reporting he feels he is near his baseline. He is on 3 liters and sats remain low 90s high 80s with no SOB and denial of fatgiue. He is able to show appropriate balance, speed and confidence and overall is [facility].   Stairs            Wheelchair Mobility    Modified Rankin (Stroke Patients Only)       Balance Overall balance assessment: Modified Independent                                            Pertinent Vitals/Pain Pain Assessment:  (only chronic low back stiffness)    Home Living Family/patient expects to be discharged to:: Private residence Living Arrangements: Spouse/significant other Available Help at Discharge: Family;Available PRN/intermittently Type of Home: House Home Access: Stairs to enter Entrance Stairs-Rails: Right;Left;Can reach both Entrance Stairs-Number of Steps: 3   Home Equipment: Cane - single point;Walker - 2 wheels;Bedside commode;Shower seat;Shower seat - built in      Prior Function Level of Independence: Independent with assistive device(s)         Comments: Intermittent use of SPC "when back hurts".     Hand Dominance        Extremity/Trunk Assessment   Upper Extremity Assessment: Overall WFL for tasks assessed           Lower Extremity Assessment: Overall WFL for tasks assessed         Communication   Communication: No difficulties  Cognition Arousal/Alertness: Awake/alert Behavior During Therapy: WFL for tasks assessed/performed Overall Cognitive Status: Within Functional Limits for tasks assessed                      General Comments      Exercises  Assessment/Plan    PT Assessment Patent does not need any further PT services  PT Diagnosis Generalized weakness   PT Problem List    PT Treatment Interventions     PT Goals (Current goals can be found in the Care Plan section) Acute Rehab PT Goals Patient Stated Goal: Go home PT Goal Formulation: With patient    Frequency     Barriers to discharge        Co-evaluation               End of Session Equipment Utilized During Treatment: Gait belt;Oxygen (3 liters) Activity Tolerance: Patient tolerated treatment well Patient left: with bed alarm set;with call bell/phone within reach           Time: 1325-1349 PT Time Calculation (min) (ACUTE ONLY): 24 min   Charges:   PT Evaluation $PT Eval Low Complexity: 1 Procedure      PT G Codes:        Kreg Shropshire, DPT 11/30/2015, 3:32 PM

## 2015-11-30 NOTE — Progress Notes (Signed)
Patient ID: John Mendoza, male   DOB: [birth year], 70 y.o.   MRN: [id number] [facility] PROGRESS NOTE  John Mendoza [id number] DOB: [birth year] DOA: 11/29/2015 PCP: Adin Hector, MD  HPI/Subjective: Patient feeling better. He did have a fall at home. No complaints of shortness of breath. Some cough.  Objective: Filed Vitals:   11/30/15 1351 11/30/15 1351  BP: 160/69   Pulse: 59 59  Temp: 97.8 F (36.6 C)   Resp: 18     Filed Weights   11/29/15 1444 11/29/15 2112  Weight: 68.04 kg (150 lb) 75.751 kg (167 lb)    ROS: Review of Systems  Constitutional: Negative for fever and chills.  Eyes: Negative for blurred vision.  Respiratory: Positive for cough. Negative for shortness of breath.   Cardiovascular: Negative for chest pain.  Gastrointestinal: Negative for nausea, vomiting, abdominal pain, diarrhea and constipation.  Genitourinary: Negative for dysuria.  Musculoskeletal: Negative for joint pain.  Neurological: Negative for dizziness and headaches.   Exam: Physical Exam  Constitutional: He is oriented to person, place, and time.  HENT:  Nose: No mucosal edema.  Mouth/Throat: No oropharyngeal exudate or posterior oropharyngeal edema.  Eyes: Conjunctivae, EOM and lids are normal. Pupils are equal, round, and reactive to light.  Neck: No JVD present. Carotid bruit is not present. No edema present. No thyroid mass and no thyromegaly present.  Cardiovascular: S1 normal and S2 normal.  Exam reveals no gallop.   No murmur heard. Pulses:      Dorsalis pedis pulses are 2+ on the right side, and 2+ on the left side.  Respiratory: No respiratory distress. He has no wheezes. He has rhonchi in the right middle field and the right lower field. He has no rales.  GI: Soft. Bowel sounds are normal. There is no tenderness.  Musculoskeletal:       Right ankle: He exhibits swelling.       Left ankle: He exhibits swelling.  Lymphadenopathy:    He has no cervical adenopathy.   Neurological: He is alert and oriented to person, place, and time. No cranial nerve deficit.  Skin: Skin is warm. No rash noted. Nails show no clubbing.  Psychiatric: He has a normal mood and affect.      Data Reviewed: Basic Metabolic Panel:  Recent Labs Lab 11/29/15 1512 11/30/15 0323  NA 136 139  K 4.5 4.4  CL 96* 100*  CO2 34* 35*  GLUCOSE 128* 100*  BUN 19 15  CREATININE 0.80 0.82  CALCIUM 8.6* 8.3*   Liver Function Tests:  Recent Labs Lab 11/29/15 1512  AST 14*  ALT 13*  ALKPHOS 66  BILITOT 0.5  PROT 6.8  ALBUMIN 3.6    Recent Labs Lab 11/29/15 1512  LIPASE 15   CBC:  Recent Labs Lab 11/29/15 1512 11/30/15 0323  WBC 16.4* 10.1  NEUTROABS 15.5*  --   HGB 10.7* 9.7*  HCT 33.8* 30.0*  MCV 80.9 81.4  PLT 223 183   Cardiac Enzymes:  Recent Labs Lab 11/29/15 1512  CKTOTAL 184  TROPONINI 0.04*   BNP (last 3 results)  Recent Labs  01/26/15 1946  BNP 420.0*      Recent Results (from the past 240 hour(s))  Urine culture     Status: None   Collection Time: 11/29/15  3:01 PM  Result Value Ref Range Status   Specimen Description URINE, RANDOM  Final   Special Requests NONE  Final   Culture NO GROWTH Performed at  [facility]   Final   Report Status 11/30/2015 FINAL  Final  Blood Culture (routine x 2)     Status: None (Preliminary result)   Collection Time: 12-10-2015  3:12 PM  Result Value Ref Range Status   Specimen Description BLOOD LEFT FOREARM  Final   Special Requests   Final    BOTTLES DRAWN AEROBIC AND ANAEROBIC AER 3ML ANA 1ML   Culture NO GROWTH < 24 HOURS  Final   Report Status PENDING  Incomplete  Blood Culture (routine x 2)     Status: None (Preliminary result)   Collection Time: Dec 10, 2015  3:56 PM  Result Value Ref Range Status   Specimen Description BLOOD RIGHT ARM  Final   Special Requests   Final    BOTTLES DRAWN AEROBIC AND ANAEROBIC AER 3ML ANA .5ML   Culture NO GROWTH < 24 HOURS  Final   Report Status  PENDING  Incomplete     Studies: Dg Elbow 2 Views Right  12-10-15  CLINICAL DATA:  Elbow pain after falling today. History of Parkinson's disease. Initial encounter. EXAM: RIGHT ELBOW - 2 VIEW COMPARISON:  None. FINDINGS: The mineralization and alignment are normal. There is no evidence of acute fracture or dislocation. The joint spaces are maintained. There is mild spurring of the humeral epicondyles and coronoid process. No elbow joint effusion or focal soft tissue swelling identified. IMPRESSION: No acute osseous findings demonstrated.  Mild degenerative spurring. Electronically Signed   By: Richardean Sale M.D.   On: 2015/12/10 15:30   Ct Head Wo Contrast  12/10/2015  CLINICAL DATA:  [place] today, on Coumadin, history stroke, throat cancer, hypertension, cervical spine fusion, CHF, COPD, coronary artery disease post MI, Parkinson's, type II diabetes mellitus, former smoker EXAM: CT HEAD WITHOUT CONTRAST CT CERVICAL SPINE WITHOUT CONTRAST TECHNIQUE: Multidetector CT imaging of the head and cervical spine was performed following the standard protocol without intravenous contrast. Multiplanar CT image reconstructions of the cervical spine were also generated. COMPARISON:  CT head 10/29/2015 ; correlation PET-CT 02/21/2013 FINDINGS: CT HEAD FINDINGS Generalized atrophy. Normal ventricular morphology. No midline shift or mass effect. Normal appearance of brain parenchyma. No intracranial hemorrhage, mass lesion, or evidence acute infarction. No extra-axial fluid collections. Atherosclerotic calcifications at skullbase. Visualized paranasal sinuses and mastoid air cells clear. Nasal septal deviation to the RIGHT. Skull intact. CT CERVICAL SPINE FINDINGS Prior anterior fusion C3-C5 with residual anterior hardware at C3-C4. Disc space narrowing with endplate spur formation C5-C6 and C6-C7. Diffuse osseous demineralization. Vertebral body heights maintained without fracture or subluxation. Mild scattered facet  degenerative changes. Visualized skullbase intact. Extensive atherosclerotic calcification in the carotid systems. Lung apices clear. Asymmetric soft tissue at the RIGHT piriform sinus extending to lateral aspect of the larynx. This appears grossly unchanged since CT images from a PET-CT from 2014, question sequela of therapy and can be assessed by followup direct visualization. IMPRESSION: Generalized atrophy. No acute intracranial abnormalities. Scattered mild degenerative changes of the cervical spine with postoperative changes of anterior cervical fusion C3-C5. No acute cervical spine abnormalities. Asymmetric soft tissue at RIGHT perform sinus into RIGHT larynx, could be related to prior treatment of throat cancer and is grossly unchanged since 2014 coma recommend followup direct visualization to exclude residual tumor. Electronically Signed   By: Lavonia Dana M.D.   On: 12-10-2015 16:20   Ct Cervical Spine Wo Contrast  12/10/15  CLINICAL DATA:  [place] today, on Coumadin, history stroke, throat cancer, hypertension, cervical spine fusion, CHF, COPD, coronary  artery disease post MI, Parkinson's, type II diabetes mellitus, former smoker EXAM: CT HEAD WITHOUT CONTRAST CT CERVICAL SPINE WITHOUT CONTRAST TECHNIQUE: Multidetector CT imaging of the head and cervical spine was performed following the standard protocol without intravenous contrast. Multiplanar CT image reconstructions of the cervical spine were also generated. COMPARISON:  CT head 10/29/2015 ; correlation PET-CT 02/21/2013 FINDINGS: CT HEAD FINDINGS Generalized atrophy. Normal ventricular morphology. No midline shift or mass effect. Normal appearance of brain parenchyma. No intracranial hemorrhage, mass lesion, or evidence acute infarction. No extra-axial fluid collections. Atherosclerotic calcifications at skullbase. Visualized paranasal sinuses and mastoid air cells clear. Nasal septal deviation to the RIGHT. Skull intact. CT CERVICAL SPINE FINDINGS  Prior anterior fusion C3-C5 with residual anterior hardware at C3-C4. Disc space narrowing with endplate spur formation C5-C6 and C6-C7. Diffuse osseous demineralization. Vertebral body heights maintained without fracture or subluxation. Mild scattered facet degenerative changes. Visualized skullbase intact. Extensive atherosclerotic calcification in the carotid systems. Lung apices clear. Asymmetric soft tissue at the RIGHT piriform sinus extending to lateral aspect of the larynx. This appears grossly unchanged since CT images from a PET-CT from 2014, question sequela of therapy and can be assessed by followup direct visualization. IMPRESSION: Generalized atrophy. No acute intracranial abnormalities. Scattered mild degenerative changes of the cervical spine with postoperative changes of anterior cervical fusion C3-C5. No acute cervical spine abnormalities. Asymmetric soft tissue at RIGHT perform sinus into RIGHT larynx, could be related to prior treatment of throat cancer and is grossly unchanged since 2014 coma recommend followup direct visualization to exclude residual tumor. Electronically Signed   By: Lavonia Dana M.D.   On: 11/29/2015 16:20   Dg Chest Port 1 View  11/29/2015  CLINICAL DATA:  Fall in kitchen today. Sepsis. Increased weakness and poor intake for the last 3-4 days. EXAM: PORTABLE CHEST 1 VIEW COMPARISON:  Two-view chest x-ray 10/29/2015 FINDINGS: The heart is mildly enlarged. Median sternotomy for CABG is noted. Atherosclerotic calcifications of the aorta are again noted. Emphysematous changes are present. The knee right middle lobe pneumonia is present. Scarring or atelectasis at the left base is stable. The visualized soft tissues and bony thorax are unremarkable. IMPRESSION: 1. New right middle lobe pneumonia. 2. Emphysema. 3. Borderline cardiomegaly without failure. 4. Atherosclerosis of the thoracic aorta. Electronically Signed   By: San Morelle M.D.   On: 11/29/2015 15:31     Scheduled Meds: . ARIPiprazole  5 mg Oral BID  . aspirin EC  81 mg Oral QHS  . atorvastatin  40 mg Oral QHS  . bisoprolol  10 mg Oral Daily  . carbidopa-levodopa  1 tablet Oral TID  . clonazePAM  1 mg Oral QHS  . fentaNYL  50 mcg Transdermal Q72H  . lamoTRIgine  25 mg Oral BID  . levothyroxine  137 mcg Oral QAC breakfast  . losartan  25 mg Oral Daily  . metFORMIN  500 mg Oral BID WC  . pantoprazole  40 mg Oral BID AC  . PARoxetine  60 mg Oral Daily  . piperacillin-tazobactam (ZOSYN)  IV  3.375 g Intravenous Q8H  . tamsulosin  0.4 mg Oral Daily  . warfarin  4 mg Oral ONCE-1800  . Warfarin - Pharmacist Dosing Inpatient   Does not apply q1800    Assessment/Plan:  1. Aspiration pneumonia. Right middle lobe pneumonia. Patient on Zosyn. Patient did well with speech pathology. Patient on dysphagia diet. Potential discharge tomorrow. 2. Elevated troponin only borderline. No complaints of chest pain. 3. Chronic respiratory failure  continue oxygen supplementation 4. History of DVT on Coumadin 5. Fall at home. Did well today with physical therapy. 6. Essential hypertension on losartan 7. Parkinson's disease continue current medications  Code Status:     Code Status Orders        Start     Ordered   11/29/15 2114  Full code   Continuous     11/29/15 2113    Code Status History    Date Active Date Inactive Code Status Order ID Comments User Context   03/06/2015 10:40 AM 03/08/2015  2:22 PM Full Code [id number]  Adin Hector, MD Inpatient   02/01/2015  5:36 PM 02/08/2015  6:46 PM Full Code [id number]  Samella Parr, NP Inpatient   01/26/2015 10:05 PM 02/01/2015  5:36 PM Full Code [id number]  Demetrios Loll, MD Inpatient     Disposition Plan: Potentially home tomorrow  Antibiotics:  Zosyn  Time spent: 25 minutes  [place], [facility]

## 2015-11-30 NOTE — Plan of Care (Signed)
Problem: Education: Goal: Knowledge of [facility] Education information/materials will improve Outcome: Progressing Pt likes to be called  John Mendoza    Past Medical History   Diagnosis  Date   .  Barrett esophagus     .  Hypertension     .  Hyperlipidemia     .  Multiple pulmonary nodules     .  CHF (congestive heart failure) ([place])     .  Lumbar spinal stenosis     .  COPD (chronic obstructive pulmonary disease) ([place])     .  CAD (coronary artery disease)         on 2l home oxygen   .  On home oxygen therapy     .  DVT (deep venous thrombosis) ([place])  11/2013; 02/01/2015       on coumadin   .  Parkinson's disease ([place])     .  Recurrent aspiration pneumonia ([place])     .  Sleep apnea     .  Hypothyroidism     .  Type II diabetes mellitus ([place])     .  Iron deficiency anemia     .  GERD (gastroesophageal reflux disease)     .  Seizures ([place])     .  DJD (degenerative joint disease)     .  Arthritis     .  Chronic lower back pain     .  History of gout     .  Depression     .  Anxiety     .  Basal cell carcinoma     .  Throat cancer ([place])     .  Heart attack ([place])  03/20/1995   .  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

## 2015-11-30 NOTE — Progress Notes (Signed)
Initial Nutrition Assessment  DOCUMENTATION CODES:   Not applicable  INTERVENTION:   Cater to pt preferences on dysphagia diet order, SLP evaluation pending. RD ordered eggs and pancakes for pt for lunch per SLP approval with nectar thick liquids and Magic Cup. Recommend Mighty Shakes on meal trays TID and Magic Cup BID for added nutrition (each supplement provides approximately 300kcals and 9g protein)   NUTRITION DIAGNOSIS:   Swallowing difficulty related to chronic illness as evidenced by  (chronic dysphagia diet order, SLP following, thickened liquids).  GOAL:   Patient will meet greater than or equal to 90% of their needs  MONITOR:   PO intake, Supplement acceptance, Labs, I & O's, Weight trends  REASON FOR ASSESSMENT:   Malnutrition Screening Tool    ASSESSMENT:   Pt admitted with falls and pna, likely aspiration. Pt h/o head and neck cancer in the past as well as severe Parkinsons Disease with dysphagia. Per Md note, pt drinking Nectar thick liquids at home PTA.  Pt had PEG placed August 2016 and per report from pt Dr. Candace Cruise removed 3-4 months ago.  Past Medical History  Diagnosis Date  . Barrett esophagus   . Hypertension   . Hyperlipidemia   . Multiple pulmonary nodules   . CHF (congestive heart failure) ([place])   . Lumbar spinal stenosis   . COPD (chronic obstructive pulmonary disease) ([place])   . CAD (coronary artery disease)     on 2l home oxygen  . On home oxygen therapy   . DVT (deep venous thrombosis) ([place]) 11/2013; 02/01/2015    on coumadin  . Parkinson's disease ([place])   . Recurrent aspiration pneumonia ([place])   . Sleep apnea   . Hypothyroidism   . Type II diabetes mellitus ([place])   . Iron deficiency anemia   . GERD (gastroesophageal reflux disease)   . Seizures ([place])   . DJD (degenerative joint disease)   . Arthritis   . Chronic lower back pain   . History of gout   . Depression   . Anxiety   . Basal cell carcinoma   . Throat cancer ([place])   . Heart  attack ([place]) 03/20/1995  . 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 When asked if pt ate breakfast, he replied no.   Pt reports PTA his appetite was doing good. Pt reports he was using thick-it thickener and drinking Goya thickened as well as [employer]. Pt reports usually eating soft scrambled eggs in the am and likes pancakes, soft scrambled eggs for lunch and then for dinner he eats a lot of pasta as he can mashed it down with a fork and swallow without difficulty. Pt reports at times he has drank ensures but recently he was drinking more of the Carnation drinks with thickener. Pt reports drinking a thickened supplement 1-2 per day PTA.  Last admission in August of 2016, pt transferred to [facility] and had PEG placed. Pt at that time was started on Jevity 1.5 boluses of 6 cans total per day.  Per pt report, 3-4 months ago, pt reports going to Dr. Myrna Blazer office outpatient to have his PEG tube cleaned out.  Once there, Dr. Candace Cruise removed the PEG tube completely. Pt reports it has healed  well.  Pt has had multiple MBSS per SLP chart review, last one in the fall of 2016 and pt was recommended for Nectar thick liquids at that time.  RD does note in H&P pt wife reports pt has been drinking some thin liquids PTA.    Medications: Metformin, Protonix, Coumadin, NS at 20mL/hr Labs: reviewed   Gastrointestinal Profile: Last BM:  11/28/2015   Nutrition-Focused Physical Exam Findings: Nutrition-Focused physical exam completed. Findings are no fat depletion, mild-moderate muscle depletion, and no edema.     Weight Change: Pt reports he weighs himself everyday and reports that he usually weighs 168-169lbs at home and it has been steady for a while. Pt reports a few  months ago, his appetite was 170something.  Per [facility] weight trends last admission pt weight 183lbs on 02/08/2015 (9% weight loss in 10 months)   Skin:  Reviewed, no issues   Height:   Ht Readings from Last 1 Encounters:  11/29/15 5\' 5"  (1.651 m)    Weight:   Wt Readings from Last 1 Encounters:  11/29/15 167 lb (75.751 kg)    Wt Readings from Last 10 Encounters:  11/29/15 167 lb (75.751 kg)  10/29/15 170 lb (77.111 kg)  03/06/15 184 lb (83.462 kg)  02/08/15 183 lb 10.3 oz (83.3 kg)  01/28/15 200 lb 1.6 oz (90.765 kg)  07/12/14 191 lb (86.637 kg)  02/20/14 192 lb (87.091 kg)  11/03/13 191 lb (86.637 kg)  09/02/13 187 lb (84.823 kg)  03/29/13 207 lb (93.895 kg)    BMI:  Body mass index is 27.79 kg/(m^2).  Estimated Nutritional Needs:   Kcal:  1900-2280kcals  Protein:  91-114g protein  Fluid:  >/= 2 L fluid  EDUCATION NEEDS:   Education needs no appropriate at this time  Dwyane Luo, RD, LDN Pager [phone] Weekend/On-Call Pager [phone]

## 2015-11-30 NOTE — Evaluation (Signed)
Clinical/Bedside Swallow Evaluation Patient Details  Name: KEYUN TRENARY MRN: [id number] Date of Birth: [birth year]  Today's Date: 11/30/2015 Time: SLP Start Time (ACUTE ONLY): 0900 SLP Stop Time (ACUTE ONLY): 1000 SLP Time Calculation (min) (ACUTE ONLY): 60 min  Past Medical History:  Past Medical History  Diagnosis Date  . Barrett esophagus   . Hypertension   . Hyperlipidemia   . Multiple pulmonary nodules   . CHF (congestive heart failure) ([place])   . Lumbar spinal stenosis   . COPD (chronic obstructive pulmonary disease) ([place])   . CAD (coronary artery disease)     on 2l home oxygen  . On home oxygen therapy   . DVT (deep venous thrombosis) ([place]) 11/2013; 02/01/2015    on coumadin  . Parkinson's disease ([place])   . Recurrent aspiration pneumonia ([place])   . Sleep apnea   . Hypothyroidism   . Type II diabetes mellitus ([place])   . Iron deficiency anemia   . GERD (gastroesophageal reflux disease)   . Seizures ([place])   . DJD (degenerative joint disease)   . Arthritis   . Chronic lower back pain   . History of gout   . Depression   . Anxiety   . Basal cell carcinoma   . Throat cancer ([place])   . Heart attack ([place]) 03/20/1995  . 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 Anterior cervical decomp/discectomy fusion  X 2  . Radical neck dissection Right ~ 1998    "throat cancer"  . Total knee arthroplasty Right 2011  . Thyroidectomy  ~ 1996 X 2  . Cataract extraction w/ intraocular lens  implant, bilateral Bilateral   . Tonsillectomy    . Excisional hemorrhoidectomy    . Joint replacement    . Back surgery    . Posterior fusion cervical spine  X 1  . Coronary angioplasty with stent placement    . Coronary artery bypass graft  1996    "CABG X3"  . Basal cell carcinoma excision     HPI:  Pt is a 70 y.o. male with a known history of Radical neck dissectionw/  Radiation txs and late effects of Dyspahgia(suspected), Goodpasture Syndrome(pulmonary), Barrett's Esophagus, GERD, Oropharyngeal Phase Dysphagia per MBSSs, Hypertension, Parkinson's Disease with dysphagia, and recurrent aspiration pneumonias, sleep apnea, seizure disorder, chronic low back pain, history of discitis and vertebral osteomyelitis September 2016 secondary to group B streptococcus bacteremia, DVT on chronic Coumadin therapy presents from home secondary to worsening cough, weakness and disorientation. Patient has severe Parkinson's disease and has had prior admissions for aspiration pneumonia. He is on a nectar thick liquid diet at home. No obvious aspiration according to wife. Occasionally he has been drinking thin liquids. He has been having chills, increased sweats, decreased appetite over the last 2-3 days. He was noted to be more confused today and extremely weak. He had a fall in the kitchen today. Patient is on chronic 2 L home oxygen. At baseline he ambulates with a cane at home but also has a walker. The emergency room his vitals are stable, he has an increased white count of 16,000 and chest x-ray with new right middle lobe pneumonia. Pt is currently NPO and wanting to eat/drink. He describes his following of aspiration precautions and strategies recommended from previous MBSSs.    Assessment / Plan / Recommendation Clinical Impression  Pt appears to  adequately tolerate trials of Nectar consistency liquids and mech soft/puree foods w/ no overt s/s of aspiration noted; pt consumed SINGLE ice chip trials w/ same presentation. No declinein respiratory status during po trials occured. During oral phase, pt benefited from alternating foods/liquids to aid full mastication/clearing of min. soft solid bolus residue intermittently. No Esophageal phase deficits observed w/ few trials though pt has baseline of Barett's Esophagus and GERD. No trials of thin liquids were given sec. to pt's baseline  Pharyngeal phase dysphagia - see results of MBSS 05/30/15. Pt continues to be at increased risk for aspiration of po's d/t baseline dxs of Dysphagia, Parkinson's Dis., and late effects of the throat Ca w/ Radiation tx as well as Esophageal dxs. Recommend pt initiate a Dysphagia level 2 w/ Nectar liquids w/ strict aspiration and Reflux precautions. Assitance at meals; Meds in Puree - Crushed when able or in liquid form mixed in a Puree. ST will f/u w/ pt's status while admitted for any education indicated. Pt appears at his baseline.     Aspiration Risk  Moderate aspiration risk    Diet Recommendation  Dysphagia 2 w/ Nectar liquids; strict Aspiration and Reflux precautions  Medication Administration: Crushed with puree (or in liquid form mixed in a Puree; whole in puree if nec.)    Other  Recommendations Recommended Consults:  (f/u on Outpt basis w/ GI/ENT; Dietician) Oral Care Recommendations: Oral care BID;Staff/trained caregiver to provide oral care Other Recommendations: Order thickener from pharmacy;Prohibited food (jello, ice cream, thin soups);Remove water pitcher;Have oral suction available   Follow up Recommendations  None    Frequency and Duration min 2x/week  1 week       Prognosis Prognosis for Safe Diet Advancement: Fair Barriers to Reach Goals:  (baseline Medical dxs)      Swallow Study   General Date of Onset: 11/29/15 HPI: Pt is a 70 y.o. male with a known history of Radical neck dissectionw/ Radiation txs and late effects of Dyspahgia(suspected), Goodpasture Syndrome(pulmonary), Barrett's Esophagus, GERD, Oropharyngeal Phase Dysphagia per MBSSs, Hypertension, Parkinson's Disease with dysphagia, and recurrent aspiration pneumonias, sleep apnea, seizure disorder, chronic low back pain, history of discitis and vertebral osteomyelitis September 2016 secondary to group B streptococcus bacteremia, DVT on chronic Coumadin therapy presents from home secondary to worsening cough,  weakness and disorientation. Patient has severe Parkinson's disease and has had prior admissions for aspiration pneumonia. He is on a nectar thick liquid diet at home. No obvious aspiration according to wife. Occasionally he has been drinking thin liquids. He has been having chills, increased sweats, decreased appetite over the last 2-3 days. He was noted to be more confused today and extremely weak. He had a fall in the kitchen today. Patient is on chronic 2 L home oxygen. At baseline he ambulates with a cane at home but also has a walker. The emergency room his vitals are stable, he has an increased white count of 16,000 and chest x-ray with new right middle lobe pneumonia. Pt is currently NPO and wanting to eat/drink. He describes his following of aspiration precautions and strategies recommended from previous MBSSs.  Type of Study: Bedside Swallow Evaluation Previous Swallow Assessment: 05/30/15: This 70 year old man with complicated medical history including throat cancer with radiation treatment in the past; is presenting with moderate oropharyngeal dysphagia characterized by lingual tremoring, delayed pharyngeal swallow initiation (triggers while falling from the valleculae to the pyriform sinuses), reduced tongue base retraction, reduced hyolaryngeal movement, and moderate pharyngeal residue post swallow .  There was no observed laryngeal penetration/aspiration of solids. There was laryngeal penetration with laryngeal vestibule residue (no aspiration) with honey-thick and nectar-thick liquid. A throat clear maneuver and swallow is effective in clearing penetrated material and aids pharyngeal clearance as well. There was aspiration of thin liquid as the patient could not generate the throat clear fast enough to prevent the penetrated material from entering the airway. This study is worse than the prior study at this facility (01/17/2012) but much better than the study done at [facility] (02/03/2015) while  he was very ill and debilitated. This study supports a soft/moist diet with nectar-thick liquids and strict adherence to throat clear and swallow maneuver every 1 to 3 swallows. The patient has swallowing exercises and I have given him 2 more. He is reminded to maintain strict and stringent oral care. It was reviewed w/ pt the probability that his swallow would worsen due to ongoing late effects of radiation treatment; exercises given could at best maintain his current swallow function. Diet Prior to this Study: Dysphagia 3 (soft);Nectar-thick liquids (pt is drinking thin liquids at home at times per report) Temperature Spikes Noted: No (wbc 16.4 at admission; 10.1 today) Respiratory Status: Nasal cannula (3 liters) History of Recent Intubation: No Behavior/Cognition: Alert;Cooperative;Pleasant mood Oral Cavity Assessment: Dry (sticky) Oral Care Completed by SLP: Yes Oral Cavity - Dentition: Adequate natural dentition Vision: Functional for self-feeding Self-Feeding Abilities: Able to feed self;Needs assist;Needs set up (tremors in UEs) Patient Positioning: Upright in bed Baseline Vocal Quality: Normal Volitional Cough: Strong;Congested Volitional Swallow: Able to elicit    Oral/Motor/Sensory Function Overall Oral Motor/Sensory Function: Mild impairment (lingual tremors; otherwise wfl)   Ice Chips Ice chips: Within functional limits Presentation: Spoon (fed; one at a time - 5 trials)   Thin Liquid Thin Liquid: Not tested    Nectar Thick Nectar Thick Liquid: Within functional limits Presentation: Cup;Self Fed (8 trials - single sips)   Honey Thick Honey Thick Liquid: Not tested   Puree Puree: Within functional limits Presentation: Spoon (fed; 6 trials)   Solid   GO   Solid: Impaired Presentation: Spoon (3 trials) Oral Phase Impairments: Impaired mastication (and clearing) Oral Phase Functional Implications: Impaired mastication;Oral residue (min) Pharyngeal Phase Impairments:   (none) Other Comments: alternating foods/liquids appeared to aid clearing       Orinda Kenner, MS, CCC-SLP  Kilo Eshelman 11/30/2015,3:02 PM

## 2015-11-30 NOTE — Care Management (Signed)
Admitted to [facility] with the diagnosis of aspiration pneumonia. Lives with wife, Romie Minus [phone] or [phone]). Last seen Dr. Ramonita Lab 11/21/15. PEG tube in the past. Poor po intake now and multiple falls in the home. Followed by Alvis Lemmings in the past. Uses a rolling walker and cane to aid in ambulation. Chronic home oxygen 2 liters per nasal cannula. Wife helps with his basic activities of daily living. IV Zosyn continues. Physical therapy evaluation pending. Shelbie Ammons RN MSN CCM Care Management [phone]

## 2015-11-30 NOTE — Plan of Care (Signed)
Problem: SLP Dysphagia Goals Goal: Misc Dysphagia Goal Pt will safely tolerate po diet of least restrictive consistency w/ no overt s/s of aspiration noted by Staff/pt/family x3 sessions.    

## 2015-11-30 NOTE — Progress Notes (Signed)
ANTICOAGULATION CONSULT NOTE - Initial Consult  Pharmacy Consult for warfarin Indication: DVT 12/16/2013, popliteal   Allergies  Allergen Reactions  . Other Other (See Comments)    Dizziness, lightheadedness, Pt states that inhaled medications make him depressed and have negative thoughts.      Patient Measurements: Height: 5\' 5"  (165.1 cm) Weight: 167 lb (75.751 kg) IBW/kg (Calculated) : 61.5  Vital Signs: Temp: 98.2 F (36.8 C) (05/26 0500) Temp Source: Oral (05/26 0500) BP: 127/70 mmHg (05/26 0500) Pulse Rate: 62 (05/26 0500)  Labs:  Recent Labs  11/29/15 1512 11/30/15 0323  HGB 10.7* 9.7*  HCT 33.8* 30.0*  PLT 223 183  APTT 78*  --   LABPROT 33.0* 29.1*  INR 3.32 2.80  CREATININE 0.80 0.82  CKTOTAL 184  --   TROPONINI 0.04*  --     Estimated Creatinine Clearance: 79.7 mL/min (by C-G formula based on Cr of 0.82).   Medical History: Past Medical History  Diagnosis Date  . Barrett esophagus   . Hypertension   . Hyperlipidemia   . Multiple pulmonary nodules   . CHF (congestive heart failure) ([place])   . Lumbar spinal stenosis   . COPD (chronic obstructive pulmonary disease) ([place])   . CAD (coronary artery disease)     on 2l home oxygen  . On home oxygen therapy   . DVT (deep venous thrombosis) ([place]) 11/2013; 02/01/2015    on coumadin  . Parkinson's disease ([place])   . Recurrent aspiration pneumonia ([place])   . Sleep apnea   . Hypothyroidism   . Type II diabetes mellitus ([place])   . Iron deficiency anemia   . GERD (gastroesophageal reflux disease)   . Seizures ([place])   . DJD (degenerative joint disease)   . Arthritis   . Chronic lower back pain   . History of gout   . Depression   . Anxiety   . Basal cell carcinoma   . Throat cancer ([place])   . Heart attack ([place]) 03/20/1995  . 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 sodium chloride 75 mL/hr at 11/29/15 2227    Assessment: 69  yom cc fall, PMH includes parkinson's with dysphagia and aspiration, seizure disorder, GBS bacteremia and discitis/vertebral osteomyelitis in September 2016, and history of DVT in June 2015. Pharmacy consulted to dose VKA for history of DVT.  5/25: INR 3.32 - 5mg  dose taken PTA? 5/26: INR: 2.8  Goal of Therapy:  INR 2-3 Monitor platelets by anticoagulation protocol: Yes   Plan:  INR supratherapeutic on admission. Per med history patient took dose of warfarin PTA. INR now therapeutic. Will resume warfarin at reduced dose due to acute illness and concurrent antibiotics.  Coumadin 4mg  x 1 tonight. Recheck INR with AM labs and adjust dosing as indicated.    Rexene Edison, PharmD Clinical Pharmacist  11/30/2015,8:01 AM

## 2015-11-30 NOTE — Progress Notes (Signed)
Pharmacy Antibiotic Note  John Mendoza is a 70 y.o. male admitted on 11/29/2015 with aspiration pneumonia.  Pharmacy has been consulted for Zosyn dosing.  Plan: Zosyn 3.375g IV q8h (4 hour infusion).  Height: 5\' 5"  (165.1 cm) Weight: 167 lb (75.751 kg) IBW/kg (Calculated) : 61.5  Temp (24hrs), Avg:98.2 F (36.8 C), Min:97.8 F (36.6 C), Max:98.6 F (37 C)   Recent Labs Lab 11/29/15 1512 11/29/15 2148 11/30/15 0323  WBC 16.4*  --  10.1  CREATININE 0.80  --  0.82  LATICACIDVEN 1.0 0.6  --     Estimated Creatinine Clearance: 79.7 mL/min (by C-G formula based on Cr of 0.82).    Allergies  Allergen Reactions  . Other Other (See Comments)    Dizziness, lightheadedness, Pt states that inhaled medications make him depressed and have negative thoughts.      Antimicrobials this admission: 5/25 Zosyn >>    Microbiology results: 5/25 BCx: pending 5/25 UCx: NG final   Thank you for allowing pharmacy to be a part of this patient's care.  Roshni Burbano C 11/30/2015 3:38 PM

## 2015-12-01 LAB — PROTIME-INR
INR: 2.32
Prothrombin Time: 25.2 seconds — ABNORMAL HIGH (ref 11.4–15.0)

## 2015-12-01 MED ORDER — CLINDAMYCIN HCL 300 MG PO CAPS: 300.0000 mg | ORAL_CAPSULE | Freq: Three times a day (TID) | ORAL | Status: DC

## 2015-12-01 MED ORDER — WARFARIN SODIUM 4 MG PO TABS: 4.0000 mg | ORAL_TABLET | Freq: Once | ORAL | Status: DC

## 2015-12-01 MED ORDER — CLINDAMYCIN HCL 300 MG PO CAPS
300.0000 mg | ORAL_CAPSULE | Freq: Three times a day (TID) | ORAL | Status: DC
  Administered 2015-12-01: 09:00:00 300 mg via ORAL
  Filled 2015-12-01 (×4): qty 1

## 2015-12-01 NOTE — Discharge Summary (Signed)
[facility] NAME: John Mendoza    MR#:  [id number]  DATE OF BIRTH:  1945/08/01  DATE OF ADMISSION:  11/29/2015 ADMITTING PHYSICIAN: Gladstone Lighter, MD  DATE OF DISCHARGE: 12/01/2015 10:41 AM  PRIMARY CARE PHYSICIAN: Tama High III, MD    ADMISSION DIAGNOSIS:  Parkinsons disease ([place]) [G20] UTI (lower urinary tract infection) [N39.0] CAP (community acquired pneumonia) [J18.9] Elevated troponin I level [R79.89] Warfarin anticoagulation [Z79.01] Fall, initial encounter [W19.XXXA]  DISCHARGE DIAGNOSIS:  Active Problems:   Aspiration pneumonia ([place])   SECONDARY DIAGNOSIS:   Past Medical History  Diagnosis Date  . Barrett esophagus   . Hypertension   . Hyperlipidemia   . Multiple pulmonary nodules   . CHF (congestive heart failure) ([place])   . Lumbar spinal stenosis   . COPD (chronic obstructive pulmonary disease) ([place])   . CAD (coronary artery disease)     on 2l home oxygen  . On home oxygen therapy   . DVT (deep venous thrombosis) ([place]) 11/2013; 02/01/2015    on coumadin  . Parkinson's disease ([place])   . Recurrent aspiration pneumonia ([place])   . Sleep apnea   . Hypothyroidism   . Type II diabetes mellitus ([place])   . Iron deficiency anemia   . GERD (gastroesophageal reflux disease)   . Seizures ([place])   . DJD (degenerative joint disease)   . Arthritis   . Chronic lower back pain   . History of gout   . Depression   . Anxiety   . Basal cell carcinoma   . Throat cancer ([place])   . Heart attack ([place]) 03/20/1995  . 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 Aspiration pneumonia. Patient was initially placed on Zosyn. I switched antibiotics over to clindamycin upon discharge. Patient did well with speech therapy. Patient on dysphagia diet with nectar thick liquids. 2. Chronic respiratory failure. Patient is on his chronic oxygen. He states he has  inhalers and nebulizers at home. 3. Fall at home. Patient did very well with physical therapy 4. Elevated troponin. This is only borderline. Demand ischemia from chronic respiratory failure. No complaints of chest pain. No further workup. 5. History of DVT on Coumadin. INR therapeutic. 6. Essential hypertension on losartan. Blood pressure elevated on discharge and patient was anxious to get out of the hospital. Prior blood pressures were better. 7. Parkinson's disease. Continue current medications. 8. Anemia likely of chronic disease. Follow-up as outpatient.  DISCHARGE CONDITIONS:  Satisfactory  CONSULTS OBTAINED:  Physical therapy Speech therapy  DRUG ALLERGIES:   Allergies  Allergen Reactions  . Other Other (See Comments)    Dizziness, lightheadedness, Pt states that inhaled medications make him depressed and have negative thoughts.      DISCHARGE MEDICATIONS:   Discharge Medication List as of 12/01/2015  8:36 AM    START taking these medications   Details  clindamycin (CLEOCIN) 300 MG capsule Take 1 capsule (300 mg total) by mouth every 8 (eight) hours., Starting 12/01/2015, Until Discontinued, Print      CONTINUE these medications which have NOT CHANGED   Details  acetaminophen (TYLENOL) 500 MG tablet Take 500 mg by mouth every 6 (six) hours as needed., Until Discontinued, Historical Med    ARIPiprazole (ABILIFY) 5 MG tablet Take 5 mg by mouth 2 (two) times daily., Until Discontinued, Historical Med    Artificial Tear Ointment (DRY EYES  OP) Place 1 drop into both eyes daily as needed (dry eyes)., Until Discontinued, Historical Med    aspirin EC 81 MG tablet Take 81 mg by mouth at bedtime. , Until Discontinued, Historical Med    atorvastatin (LIPITOR) 40 MG tablet Take 40 mg by mouth at bedtime., Until Discontinued, Historical Med    bisacodyl (DULCOLAX) 10 MG suppository Place 1 suppository (10 mg total) rectally daily as needed for moderate constipation., Starting  02/01/2015, Until Discontinued, Normal    bisoprolol (ZEBETA) 10 MG tablet Take 10 mg by mouth daily., Until Discontinued, Historical Med    carbidopa-levodopa (SINEMET IR) 25-250 MG tablet Take 1 tablet by mouth 3 (three) times daily., Until Discontinued, Historical Med    clonazePAM (KLONOPIN) 1 MG tablet Take 1 mg by mouth at bedtime., Starting 09/20/2014, Until Discontinued, Historical Med    fentaNYL (DURAGESIC - DOSED MCG/HR) 50 MCG/HR Place 1 patch (50 mcg total) onto the skin every 3 (three) days., Starting 02/08/2015, Until Discontinued, Print    HYDROcodone-acetaminophen (NORCO/VICODIN) 5-325 MG tablet Take 1 tablet by mouth every 8 (eight) hours as needed for moderate pain., Until Discontinued, Historical Med    lamoTRIgine (LAMICTAL) 25 MG tablet Take 25 mg by mouth 2 (two) times daily., Until Discontinued, Historical Med    levothyroxine (SYNTHROID, LEVOTHROID) 137 MCG tablet Take 137 mcg by mouth daily., Until Discontinued, Historical Med    losartan (COZAAR) 25 MG tablet Take 1 tablet (25 mg total) by mouth daily., Starting 03/08/2015, Until Discontinued, Print    metFORMIN (GLUCOPHAGE) 500 MG tablet Take 500 mg by mouth 2 (two) times daily., Starting 12/12/2014, Until Discontinued, Historical Med    nitroGLYCERIN (NITROSTAT) 0.4 MG SL tablet Place 0.4 mg under the tongue every 5 (five) minutes x 3 doses as needed for chest pain. , Until Discontinued, Historical Med    omeprazole (PRILOSEC) 20 MG capsule Take 40 mg by mouth 2 (two) times daily before a meal., Until Discontinued, Historical Med    PARoxetine (PAXIL) 20 MG tablet Take 60 mg by mouth daily. , Starting 07/30/2010, Until Discontinued, Historical Med    tamsulosin (FLOMAX) 0.4 MG CAPS Take 0.4 mg by mouth daily. , Until Discontinued, Historical Med    tiZANidine (ZANAFLEX) 4 MG tablet Take 4 mg by mouth every 8 (eight) hours as needed for muscle spasms., Until Discontinued, Historical Med    warfarin (COUMADIN) 5 MG  tablet Take 1 tablet (5 mg total) by mouth daily., Starting 03/08/2015, Until Discontinued, Print      STOP taking these medications     Nutritional Supplements (FEEDING SUPPLEMENT, JEVITY 1.5 CAL/FIBER,) LIQD      Water For Irrigation, Sterile (FREE WATER) SOLN          DISCHARGE INSTRUCTIONS:   Follow-up PMD one week  If you experience worsening of your admission symptoms, develop shortness of breath, life threatening emergency, suicidal or homicidal thoughts you must seek medical attention immediately by calling 911 or calling your MD immediately  if symptoms less severe.  You Must read complete instructions/literature along with all the possible adverse reactions/side effects for all the Medicines you take and that have been prescribed to you. Take any new Medicines after you have completely understood and accept all the possible adverse reactions/side effects.   Please note  You were cared for by a hospitalist during your hospital stay. If you have any questions about your discharge medications or the care you received while you were in the hospital after you are discharged,  you can call the unit and asked to speak with the hospitalist on call if the hospitalist that took care of you is not available. Once you are discharged, your primary care physician will handle any further medical issues. Please note that NO REFILLS for any discharge medications will be authorized once you are discharged, as it is imperative that you return to your primary care physician (or establish a relationship with a primary care physician if you do not have one) for your aftercare needs so that they can reassess your need for medications and monitor your lab values.    Today   CHIEF COMPLAINT:   Chief Complaint  Patient presents with  . Fall    HISTORY OF PRESENT ILLNESS:  John Mendoza  is a 70 y.o. male presented after a fall and was found to have a right-sided pneumonia.   VITAL SIGNS:  Blood  pressure 185/78, pulse 61, temperature 97.8 F (36.6 C), temperature source Oral, resp. rate 20, height 5\' 5"  (1.651 m), weight 75.751 kg (167 lb), SpO2 100 %.    PHYSICAL EXAMINATION:  GENERAL:  70 y.o.-year-old patient lying in the bed with no acute distress.  EYES: Pupils equal, round, reactive to light and accommodation. No scleral icterus. Extraocular muscles intact.  HEENT: Head atraumatic, normocephalic. Oropharynx and nasopharynx clear.  NECK:  Supple, no jugular venous distention. No thyroid enlargement, no tenderness.  LUNGS: Normal breath sounds bilaterally, no wheezing, rales,rhonchi or crepitation. No use of accessory muscles of respiration.  CARDIOVASCULAR: S1, S2 normal. No murmurs, rubs, or gallops.  ABDOMEN: Soft, non-tender, non-distended. Bowel sounds present. No organomegaly or mass.  EXTREMITIES: No pedal edema, cyanosis, or clubbing.  NEUROLOGIC: Cranial nerves II through XII are intact. Muscle strength 5/5 in all extremities. Sensation intact. Gait not checked.  PSYCHIATRIC: The patient is alert and oriented x 3.  SKIN: No obvious rash, lesion, or ulcer.   DATA REVIEW:   CBC  Recent Labs Lab 11/30/15 0323  WBC 10.1  HGB 9.7*  HCT 30.0*  PLT 183    Chemistries   Recent Labs Lab 11/29/15 1512 11/30/15 0323  NA 136 139  K 4.5 4.4  CL 96* 100*  CO2 34* 35*  GLUCOSE 128* 100*  BUN 19 15  CREATININE 0.80 0.82  CALCIUM 8.6* 8.3*  AST 14*  --   ALT 13*  --   ALKPHOS 66  --   BILITOT 0.5  --     Cardiac Enzymes  Recent Labs Lab 11/29/15 1512  TROPONINI 0.04*    Microbiology Results  Results for orders placed or performed during the hospital encounter of 11/29/15  Urine culture     Status: None   Collection Time: 11/29/15  3:01 PM  Result Value Ref Range Status   Specimen Description URINE, RANDOM  Final   Special Requests NONE  Final   Culture NO GROWTH Performed at [facility]   Final   Report Status 11/30/2015 FINAL  Final   Blood Culture (routine x 2)     Status: None (Preliminary result)   Collection Time: 11/29/15  3:12 PM  Result Value Ref Range Status   Specimen Description BLOOD LEFT FOREARM  Final   Special Requests   Final    BOTTLES DRAWN AEROBIC AND ANAEROBIC AER 3ML ANA 1ML   Culture NO GROWTH 2 DAYS  Final   Report Status PENDING  Incomplete  Blood Culture (routine x 2)     Status: None (Preliminary result)   Collection  Time: 11/29/15  3:56 PM  Result Value Ref Range Status   Specimen Description BLOOD RIGHT ARM  Final   Special Requests   Final    BOTTLES DRAWN AEROBIC AND ANAEROBIC AER 3ML ANA .5ML   Culture NO GROWTH 2 DAYS  Final   Report Status PENDING  Incomplete    RADIOLOGY:  Ct Head Wo Contrast  11/29/2015  CLINICAL DATA:  [place] today, on Coumadin, history stroke, throat cancer, hypertension, cervical spine fusion, CHF, COPD, coronary artery disease post MI, Parkinson's, type II diabetes mellitus, former smoker EXAM: CT HEAD WITHOUT CONTRAST CT CERVICAL SPINE WITHOUT CONTRAST TECHNIQUE: Multidetector CT imaging of the head and cervical spine was performed following the standard protocol without intravenous contrast. Multiplanar CT image reconstructions of the cervical spine were also generated. COMPARISON:  CT head 10/29/2015 ; correlation PET-CT 02/21/2013 FINDINGS: CT HEAD FINDINGS Generalized atrophy. Normal ventricular morphology. No midline shift or mass effect. Normal appearance of brain parenchyma. No intracranial hemorrhage, mass lesion, or evidence acute infarction. No extra-axial fluid collections. Atherosclerotic calcifications at skullbase. Visualized paranasal sinuses and mastoid air cells clear. Nasal septal deviation to the RIGHT. Skull intact. CT CERVICAL SPINE FINDINGS Prior anterior fusion C3-C5 with residual anterior hardware at C3-C4. Disc space narrowing with endplate spur formation C5-C6 and C6-C7. Diffuse osseous demineralization. Vertebral body heights maintained without  fracture or subluxation. Mild scattered facet degenerative changes. Visualized skullbase intact. Extensive atherosclerotic calcification in the carotid systems. Lung apices clear. Asymmetric soft tissue at the RIGHT piriform sinus extending to lateral aspect of the larynx. This appears grossly unchanged since CT images from a PET-CT from 2014, question sequela of therapy and can be assessed by followup direct visualization. IMPRESSION: Generalized atrophy. No acute intracranial abnormalities. Scattered mild degenerative changes of the cervical spine with postoperative changes of anterior cervical fusion C3-C5. No acute cervical spine abnormalities. Asymmetric soft tissue at RIGHT perform sinus into RIGHT larynx, could be related to prior treatment of throat cancer and is grossly unchanged since 2014 coma recommend followup direct visualization to exclude residual tumor. Electronically Signed   By: Lavonia Dana M.D.   On: 11/29/2015 16:20   Ct Cervical Spine Wo Contrast  11/29/2015  CLINICAL DATA:  [place] today, on Coumadin, history stroke, throat cancer, hypertension, cervical spine fusion, CHF, COPD, coronary artery disease post MI, Parkinson's, type II diabetes mellitus, former smoker EXAM: CT HEAD WITHOUT CONTRAST CT CERVICAL SPINE WITHOUT CONTRAST TECHNIQUE: Multidetector CT imaging of the head and cervical spine was performed following the standard protocol without intravenous contrast. Multiplanar CT image reconstructions of the cervical spine were also generated. COMPARISON:  CT head 10/29/2015 ; correlation PET-CT 02/21/2013 FINDINGS: CT HEAD FINDINGS Generalized atrophy. Normal ventricular morphology. No midline shift or mass effect. Normal appearance of brain parenchyma. No intracranial hemorrhage, mass lesion, or evidence acute infarction. No extra-axial fluid collections. Atherosclerotic calcifications at skullbase. Visualized paranasal sinuses and mastoid air cells clear. Nasal septal deviation to the  RIGHT. Skull intact. CT CERVICAL SPINE FINDINGS Prior anterior fusion C3-C5 with residual anterior hardware at C3-C4. Disc space narrowing with endplate spur formation C5-C6 and C6-C7. Diffuse osseous demineralization. Vertebral body heights maintained without fracture or subluxation. Mild scattered facet degenerative changes. Visualized skullbase intact. Extensive atherosclerotic calcification in the carotid systems. Lung apices clear. Asymmetric soft tissue at the RIGHT piriform sinus extending to lateral aspect of the larynx. This appears grossly unchanged since CT images from a PET-CT from 2014, question sequela of therapy and can be assessed by followup direct  visualization. IMPRESSION: Generalized atrophy. No acute intracranial abnormalities. Scattered mild degenerative changes of the cervical spine with postoperative changes of anterior cervical fusion C3-C5. No acute cervical spine abnormalities. Asymmetric soft tissue at RIGHT perform sinus into RIGHT larynx, could be related to prior treatment of throat cancer and is grossly unchanged since 2014 coma recommend followup direct visualization to exclude residual tumor. Electronically Signed   By: Lavonia Dana M.D.   On: 11/29/2015 16:20    Management plans discussed with the patient, family and he is in agreement.  CODE STATUS:  Code Status History    Date Active Date Inactive Code Status Order ID Comments User Context   11/29/2015  9:13 PM 12/01/2015  1:58 PM Full Code [id number]  Gladstone Lighter, MD Inpatient   03/06/2015 10:40 AM 03/08/2015  2:22 PM Full Code [id number]  Adin Hector, MD Inpatient   02/01/2015  5:36 PM 02/08/2015  6:46 PM Full Code [id number]  Samella Parr, NP Inpatient   01/26/2015 10:05 PM 02/01/2015  5:36 PM Full Code [id number]  Demetrios Loll, MD Inpatient      TOTAL TIME TAKING CARE OF THIS PATIENT: 35 minutes.    Loletha Grayer M.D on 12/01/2015 at 4:06 PM  Between 7am to 6pm - Pager - [phone]  After 6pm go to  [email or web address] - password [email or web address]  [facility] Office  [phone]  CC: Primary care physician; Adin Hector, MD

## 2015-12-01 NOTE — Progress Notes (Signed)
ANTICOAGULATION CONSULT NOTE - Initial Consult  Pharmacy Consult for warfarin Indication: DVT 12/16/2013, popliteal   Allergies  Allergen Reactions  . Other Other (See Comments)    Dizziness, lightheadedness, Pt states that inhaled medications make him depressed and have negative thoughts.      Patient Measurements: Height: 5\' 5"  (165.1 cm) Weight: 167 lb (75.751 kg) IBW/kg (Calculated) : 61.5  Vital Signs: Temp: 97.8 F (36.6 C) (05/27 0537) Temp Source: Oral (05/27 0537) BP: 185/78 mmHg (05/27 0537) Pulse Rate: 61 (05/27 0537)  Labs:  Recent Labs  11/29/15 1512 11/30/15 0323 12/01/15 0552  HGB 10.7* 9.7*  --   HCT 33.8* 30.0*  --   PLT 223 183  --   APTT 78*  --   --   LABPROT 33.0* 29.1* 25.2*  INR 3.32 2.80 2.32  CREATININE 0.80 0.82  --   CKTOTAL 184  --   --   TROPONINI 0.04*  --   --     Estimated Creatinine Clearance: 79.7 mL/min (by C-G formula based on Cr of 0.82).   Medical History: Past Medical History  Diagnosis Date  . Barrett esophagus   . Hypertension   . Hyperlipidemia   . Multiple pulmonary nodules   . CHF (congestive heart failure) ([place])   . Lumbar spinal stenosis   . COPD (chronic obstructive pulmonary disease) ([place])   . CAD (coronary artery disease)     on 2l home oxygen  . On home oxygen therapy   . DVT (deep venous thrombosis) ([place]) 11/2013; 02/01/2015    on coumadin  . Parkinson's disease ([place])   . Recurrent aspiration pneumonia ([place])   . Sleep apnea   . Hypothyroidism   . Type II diabetes mellitus ([place])   . Iron deficiency anemia   . GERD (gastroesophageal reflux disease)   . Seizures ([place])   . DJD (degenerative joint disease)   . Arthritis   . Chronic lower back pain   . History of gout   . Depression   . Anxiety   . Basal cell carcinoma   . Throat cancer ([place])   . Heart attack ([place]) 03/20/1995  . 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 Pharmacy consulted to dose VKA for history of DVT.  5/25: INR 3.32 - 5mg  dose taken PTA? 5/26: INR: 2.8 5/27: INR 2.32  Goal of Therapy:  INR 2-3 Monitor platelets by anticoagulation protocol: Yes   Plan:  INR supratherapeutic on admission. Per med history patient took dose of warfarin PTA. INR now therapeutic.   Will resume warfarin at reduced dose due to acute illness and concurrent antibiotics.  Coumadin 4mg  x 1 tonight.   As INR is therapeutic but trending down, will continue dose of Warfarin 4mg  x1 tonight and if continue to trend down, may need to increase back to 5mg  tomorrow AM.   Recheck INR with AM labs and adjust dosing as indicated.   Roe Coombs, PharmD Pharmacy Resident 12/01/2015

## 2015-12-01 NOTE — Discharge Instructions (Signed)
Dysphagia 2 diet with nectar thickened liquids °

## 2015-12-01 NOTE — Progress Notes (Signed)
Speech Language Pathology Dysphagia Treatment Patient Details Name: John Mendoza MRN: [id number] DOB: 1946-05-17 Today's Date: 12/01/2015 Time: [id number] SLP Time Calculation (min) (ACUTE ONLY): 21 min  Assessment / Plan / Recommendation Clinical Impression   pt continues to present with moderate dysphagia characterized by coughing and wet vocal quality with all po intake. Pt states that he has been on nectar thick for long term and uses Goya juice as NTL. St educated on thicken it from pharmacy and pt states he has that as well. Pt with noted poor po intake during am meal however he was preparing to be discharged from hospital. [employer] educated pt to continue with recommended diet and aspiration risk. Pt able to give verbal understanding and demonstration of intake of NTL. ST guarded on compliance with diet at home.     Diet Recommendation    NDDS2 with nectar thick liquids.    SLP Plan Continue with current plan of care   Pertinent Vitals/Pain No pain reported   Swallowing Goals     General Behavior/Cognition: Alert;Cooperative;Pleasant mood Patient Positioning: Upright in bed Oral care provided: N/A HPI: Pt is a 70 y.o. 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 Patient has severe Parkinson's disease and has had prior admissions for aspiration pneumonia. He is on a nectar thick liquid diet at home. No obvious aspiration according to wife. Occasionally he has been drinking  thin liquids. He has been having chills, increased sweats, decreased appetite over the last 2-3 days. He was noted to be more confused today and extremely weak. He had a fall in the kitchen today. Patient is on chronic 2 L home oxygen. At baseline he ambulates with a cane at home but also has a walker. The emergency room his vitals are stable, he has an increased white count of 16,000 and chest x-ray with new right middle lobe pneumonia. Pt is currently NPO and wanting to eat/drink. He describes his following of aspiration precautions and strategies recommended from previous MBSSs.   Oral Cavity - Oral Hygiene     Dysphagia Treatment Treatment Methods: Skilled observation;Patient/caregiver education Type of PO's observed: Dysphagia 2 (chopped);Nectar-thick liquids Feeding: Able to feed self Liquids provided via: Teaspoon;Cup Pharyngeal Phase Signs & Symptoms: Suspected delayed swallow initiation;Multiple swallows;Wet vocal quality;Immediate throat clear;Delayed throat clear;Immediate cough;Delayed cough Type of cueing: Verbal Amount of cueing: Minimal   GO     [place] Sauber 12/01/2015, 11:20 AM

## 2015-12-04 LAB — CULTURE, BLOOD (ROUTINE X 2)
Culture: NO GROWTH
Culture: NO GROWTH

## 2015-12-11 ENCOUNTER — Inpatient Hospital Stay
Admission: EM | Admit: 2015-12-11 | Discharge: 2015-12-13 | DRG: 177 | Disposition: A | Discharge status: Home Health | Payer: Medicare Other | Attending: Internal Medicine | Admitting: Internal Medicine

## 2015-12-11 ENCOUNTER — Encounter: Payer: Self-pay | Admitting: Emergency Medicine

## 2015-12-11 ENCOUNTER — Emergency Department: Payer: Medicare Other

## 2015-12-11 DIAGNOSIS — F329 Major depressive disorder, single episode, unspecified: Secondary | ICD-10-CM | POA: Diagnosis present

## 2015-12-11 DIAGNOSIS — J690 Pneumonitis due to inhalation of food and vomit: Principal | ICD-10-CM | POA: Diagnosis present

## 2015-12-11 DIAGNOSIS — Z888 Allergy status to other drugs, medicaments and biological substances status: Secondary | ICD-10-CM

## 2015-12-11 DIAGNOSIS — M1990 Unspecified osteoarthritis, unspecified site: Secondary | ICD-10-CM | POA: Diagnosis present

## 2015-12-11 DIAGNOSIS — F419 Anxiety disorder, unspecified: Secondary | ICD-10-CM | POA: Diagnosis present

## 2015-12-11 DIAGNOSIS — Z96651 Presence of right artificial knee joint: Secondary | ICD-10-CM | POA: Diagnosis present

## 2015-12-11 DIAGNOSIS — Z981 Arthrodesis status: Secondary | ICD-10-CM | POA: Diagnosis not present

## 2015-12-11 DIAGNOSIS — Z86718 Personal history of other venous thrombosis and embolism: Secondary | ICD-10-CM

## 2015-12-11 DIAGNOSIS — R0902 Hypoxemia: Secondary | ICD-10-CM

## 2015-12-11 DIAGNOSIS — Z8261 Family history of arthritis: Secondary | ICD-10-CM

## 2015-12-11 DIAGNOSIS — I110 Hypertensive heart disease with heart failure: Secondary | ICD-10-CM | POA: Diagnosis present

## 2015-12-11 DIAGNOSIS — G20 Parkinson's disease: Secondary | ICD-10-CM | POA: Diagnosis present

## 2015-12-11 DIAGNOSIS — G8929 Other chronic pain: Secondary | ICD-10-CM | POA: Diagnosis present

## 2015-12-11 DIAGNOSIS — G4730 Sleep apnea, unspecified: Secondary | ICD-10-CM | POA: Diagnosis present

## 2015-12-11 DIAGNOSIS — Z806 Family history of leukemia: Secondary | ICD-10-CM

## 2015-12-11 DIAGNOSIS — Z955 Presence of coronary angioplasty implant and graft: Secondary | ICD-10-CM

## 2015-12-11 DIAGNOSIS — Z79891 Long term (current) use of opiate analgesic: Secondary | ICD-10-CM | POA: Diagnosis not present

## 2015-12-11 DIAGNOSIS — D509 Iron deficiency anemia, unspecified: Secondary | ICD-10-CM | POA: Diagnosis present

## 2015-12-11 DIAGNOSIS — Z87891 Personal history of nicotine dependence: Secondary | ICD-10-CM | POA: Diagnosis not present

## 2015-12-11 DIAGNOSIS — E039 Hypothyroidism, unspecified: Secondary | ICD-10-CM | POA: Diagnosis present

## 2015-12-11 DIAGNOSIS — K2270 Barrett's esophagus without dysplasia: Secondary | ICD-10-CM | POA: Diagnosis present

## 2015-12-11 DIAGNOSIS — Z9119 Patient's noncompliance with other medical treatment and regimen: Secondary | ICD-10-CM | POA: Diagnosis not present

## 2015-12-11 DIAGNOSIS — Z951 Presence of aortocoronary bypass graft: Secondary | ICD-10-CM | POA: Diagnosis not present

## 2015-12-11 DIAGNOSIS — J449 Chronic obstructive pulmonary disease, unspecified: Secondary | ICD-10-CM | POA: Diagnosis present

## 2015-12-11 DIAGNOSIS — Z7984 Long term (current) use of oral hypoglycemic drugs: Secondary | ICD-10-CM

## 2015-12-11 DIAGNOSIS — M109 Gout, unspecified: Secondary | ICD-10-CM | POA: Diagnosis present

## 2015-12-11 DIAGNOSIS — G40909 Epilepsy, unspecified, not intractable, without status epilepticus: Secondary | ICD-10-CM | POA: Diagnosis present

## 2015-12-11 DIAGNOSIS — E785 Hyperlipidemia, unspecified: Secondary | ICD-10-CM | POA: Diagnosis present

## 2015-12-11 DIAGNOSIS — E119 Type 2 diabetes mellitus without complications: Secondary | ICD-10-CM | POA: Diagnosis present

## 2015-12-11 DIAGNOSIS — J9621 Acute and chronic respiratory failure with hypoxia: Secondary | ICD-10-CM | POA: Diagnosis present

## 2015-12-11 DIAGNOSIS — M4806 Spinal stenosis, lumbar region: Secondary | ICD-10-CM | POA: Diagnosis present

## 2015-12-11 DIAGNOSIS — Z7901 Long term (current) use of anticoagulants: Secondary | ICD-10-CM | POA: Diagnosis not present

## 2015-12-11 DIAGNOSIS — Z85819 Personal history of malignant neoplasm of unspecified site of lip, oral cavity, and pharynx: Secondary | ICD-10-CM

## 2015-12-11 DIAGNOSIS — Z79899 Other long term (current) drug therapy: Secondary | ICD-10-CM | POA: Diagnosis not present

## 2015-12-11 DIAGNOSIS — K219 Gastro-esophageal reflux disease without esophagitis: Secondary | ICD-10-CM | POA: Diagnosis present

## 2015-12-11 DIAGNOSIS — R1310 Dysphagia, unspecified: Secondary | ICD-10-CM | POA: Diagnosis present

## 2015-12-11 DIAGNOSIS — Z7982 Long term (current) use of aspirin: Secondary | ICD-10-CM | POA: Diagnosis not present

## 2015-12-11 DIAGNOSIS — J189 Pneumonia, unspecified organism: Secondary | ICD-10-CM

## 2015-12-11 DIAGNOSIS — J181 Lobar pneumonia, unspecified organism: Secondary | ICD-10-CM

## 2015-12-11 DIAGNOSIS — Z9981 Dependence on supplemental oxygen: Secondary | ICD-10-CM

## 2015-12-11 DIAGNOSIS — E440 Moderate protein-calorie malnutrition: Secondary | ICD-10-CM | POA: Diagnosis present

## 2015-12-11 DIAGNOSIS — N400 Enlarged prostate without lower urinary tract symptoms: Secondary | ICD-10-CM | POA: Diagnosis present

## 2015-12-11 DIAGNOSIS — Z85828 Personal history of other malignant neoplasm of skin: Secondary | ICD-10-CM

## 2015-12-11 DIAGNOSIS — I252 Old myocardial infarction: Secondary | ICD-10-CM | POA: Diagnosis not present

## 2015-12-11 DIAGNOSIS — Z6822 Body mass index (BMI) 22.0-22.9, adult: Secondary | ICD-10-CM

## 2015-12-11 DIAGNOSIS — M545 Low back pain: Secondary | ICD-10-CM | POA: Diagnosis present

## 2015-12-11 DIAGNOSIS — I2510 Atherosclerotic heart disease of native coronary artery without angina pectoris: Secondary | ICD-10-CM | POA: Diagnosis present

## 2015-12-11 DIAGNOSIS — I509 Heart failure, unspecified: Secondary | ICD-10-CM | POA: Diagnosis present

## 2015-12-11 LAB — URINALYSIS COMPLETE WITH MICROSCOPIC (ARMC ONLY)
Bacteria, UA: NONE SEEN
Bilirubin Urine: NEGATIVE
Glucose, UA: NEGATIVE mg/dL
HGB URINE DIPSTICK: NEGATIVE
Nitrite: NEGATIVE
PH: 5 (ref 5.0–8.0)
PROTEIN: 30 mg/dL — AB
SPECIFIC GRAVITY, URINE: 1.02 (ref 1.005–1.030)
SQUAMOUS EPITHELIAL / LPF: NONE SEEN

## 2015-12-11 LAB — COMPREHENSIVE METABOLIC PANEL
ALT: 12 U/L — ABNORMAL LOW (ref 17–63)
ANION GAP: 6 (ref 5–15)
AST: 17 U/L (ref 15–41)
Albumin: 3.5 g/dL (ref 3.5–5.0)
Alkaline Phosphatase: 76 U/L (ref 38–126)
BILIRUBIN TOTAL: 0.4 mg/dL (ref 0.3–1.2)
BUN: 22 mg/dL — ABNORMAL HIGH (ref 6–20)
CALCIUM: 8.6 mg/dL — AB (ref 8.9–10.3)
CO2: 29 mmol/L (ref 22–32)
CREATININE: 0.83 mg/dL (ref 0.61–1.24)
Chloride: 103 mmol/L (ref 101–111)
GFR calc non Af Amer: >60 mL/min (ref >60)
GLUCOSE: 131 mg/dL — AB (ref 65–99)
Potassium: 4.1 mmol/L (ref 3.5–5.1)
SODIUM: 138 mmol/L (ref 135–145)
TOTAL PROTEIN: 6.5 g/dL (ref 6.5–8.1)

## 2015-12-11 LAB — CBC WITH DIFFERENTIAL/PLATELET
Basophils Absolute: 0.1 10*3/uL (ref 0–0.1)
Basophils Relative: 0 %
EOS ABS: 0 10*3/uL (ref 0–0.7)
HCT: 33.6 % — ABNORMAL LOW (ref 40.0–52.0)
Hemoglobin: 10.7 g/dL — ABNORMAL LOW (ref 13.0–18.0)
LYMPHS ABS: 0.1 10*3/uL — AB (ref 1.0–3.6)
MCH: 26.2 pg (ref 26.0–34.0)
MCHC: 32 g/dL (ref 32.0–36.0)
MCV: 81.8 fL (ref 80.0–100.0)
MONO ABS: 0.5 10*3/uL (ref 0.2–1.0)
Neutro Abs: 13.3 10*3/uL — ABNORMAL HIGH (ref 1.4–6.5)
Neutrophils Relative %: 95 %
PLATELETS: 207 10*3/uL (ref 150–440)
RBC: 4.11 MIL/uL — ABNORMAL LOW (ref 4.40–5.90)
RDW: 16.9 % — ABNORMAL HIGH (ref 11.5–14.5)
WBC: 14 10*3/uL — ABNORMAL HIGH (ref 3.8–10.6)

## 2015-12-11 LAB — PROTIME-INR
INR: 2.13
PROTHROMBIN TIME: 23.7 s — AB (ref 11.4–15.0)

## 2015-12-11 LAB — LACTIC ACID, PLASMA
Lactic Acid, Venous: 1.1 mmol/L (ref 0.5–2.0)
Lactic Acid, Venous: 1.3 mmol/L (ref 0.5–2.0)

## 2015-12-11 MED ORDER — FENTANYL 50 MCG/HR TD PT72
50.0000 ug | MEDICATED_PATCH | TRANSDERMAL | Status: DC
  Administered 2015-12-12: 50 ug via TRANSDERMAL
  Filled 2015-12-11: qty 1

## 2015-12-11 MED ORDER — ONDANSETRON HCL 4 MG PO TABS: 4.0000 mg | ORAL_TABLET | Freq: Four times a day (QID) | ORAL | Status: DC | PRN

## 2015-12-11 MED ORDER — ACETAMINOPHEN 500 MG PO TABS: 500.0000 mg | ORAL_TABLET | Freq: Four times a day (QID) | ORAL | Status: DC | PRN

## 2015-12-11 MED ORDER — SODIUM CHLORIDE 0.9 % IV SOLN
Freq: Once | INTRAVENOUS | Status: AC
  Administered 2015-12-11: 19:00:00 via INTRAVENOUS

## 2015-12-11 MED ORDER — PAROXETINE HCL 20 MG PO TABS
60.0000 mg | ORAL_TABLET | Freq: Every day | ORAL | Status: DC
  Administered 2015-12-11 – 2015-12-13 (×3): 60 mg via ORAL
  Filled 2015-12-11 (×3): qty 3

## 2015-12-11 MED ORDER — MORPHINE SULFATE (PF) 2 MG/ML IV SOLN: 1.0000 mg | INTRAVENOUS | Status: DC | PRN

## 2015-12-11 MED ORDER — WARFARIN SODIUM 2.5 MG PO TABS
5.0000 mg | ORAL_TABLET | Freq: Every day | ORAL | Status: DC
  Administered 2015-12-11 – 2015-12-13 (×3): 5 mg via ORAL
  Filled 2015-12-11 (×3): qty 2

## 2015-12-11 MED ORDER — TIZANIDINE HCL 4 MG PO TABS
4.0000 mg | ORAL_TABLET | Freq: Three times a day (TID) | ORAL | Status: DC | PRN
  Filled 2015-12-11: qty 1

## 2015-12-11 MED ORDER — CLONAZEPAM 1 MG PO TABS
1.0000 mg | ORAL_TABLET | Freq: Every day | ORAL | Status: DC
  Administered 2015-12-11 – 2015-12-12 (×2): 1 mg via ORAL
  Filled 2015-12-11 (×2): qty 1

## 2015-12-11 MED ORDER — ARIPIPRAZOLE 2 MG PO TABS
5.0000 mg | ORAL_TABLET | Freq: Two times a day (BID) | ORAL | Status: DC
  Administered 2015-12-11 – 2015-12-13 (×4): 5 mg via ORAL
  Filled 2015-12-11 (×4): qty 3

## 2015-12-11 MED ORDER — PANTOPRAZOLE SODIUM 40 MG PO TBEC
40.0000 mg | DELAYED_RELEASE_TABLET | Freq: Every day | ORAL | Status: DC
  Administered 2015-12-11 – 2015-12-13 (×3): 40 mg via ORAL
  Filled 2015-12-11 (×3): qty 1

## 2015-12-11 MED ORDER — ONDANSETRON HCL 4 MG/2ML IJ SOLN: 4.0000 mg | Freq: Four times a day (QID) | INTRAMUSCULAR | Status: DC | PRN

## 2015-12-11 MED ORDER — ATORVASTATIN CALCIUM 20 MG PO TABS
40.0000 mg | ORAL_TABLET | Freq: Every day | ORAL | Status: DC
  Administered 2015-12-11 – 2015-12-12 (×2): 40 mg via ORAL
  Filled 2015-12-11 (×2): qty 2

## 2015-12-11 MED ORDER — CARBIDOPA-LEVODOPA 25-250 MG PO TABS
1.0000 | ORAL_TABLET | Freq: Three times a day (TID) | ORAL | Status: DC
  Administered 2015-12-11 – 2015-12-13 (×6): 1 via ORAL
  Filled 2015-12-11 (×6): qty 1

## 2015-12-11 MED ORDER — WARFARIN - PHARMACIST DOSING INPATIENT
Freq: Every day | Status: DC
  Administered 2015-12-11 – 2015-12-12 (×2)

## 2015-12-11 MED ORDER — BISOPROLOL FUMARATE 5 MG PO TABS
10.0000 mg | ORAL_TABLET | Freq: Every day | ORAL | Status: DC
  Administered 2015-12-11 – 2015-12-13 (×3): 10 mg via ORAL
  Filled 2015-12-11 (×3): qty 2
  Filled 2015-12-11: qty 1

## 2015-12-11 MED ORDER — LEVOTHYROXINE SODIUM 50 MCG PO TABS
137.0000 ug | ORAL_TABLET | Freq: Every day | ORAL | Status: DC
  Administered 2015-12-12 – 2015-12-13 (×2): 137 ug via ORAL
  Filled 2015-12-11 (×2): qty 1

## 2015-12-11 MED ORDER — FENTANYL 50 MCG/HR TD PT72
50.0000 ug | MEDICATED_PATCH | TRANSDERMAL | Status: DC
  Filled 2015-12-11: qty 1

## 2015-12-11 MED ORDER — BISACODYL 10 MG RE SUPP: 10.0000 mg | Freq: Every day | RECTAL | Status: DC | PRN

## 2015-12-11 MED ORDER — LOSARTAN POTASSIUM 50 MG PO TABS
50.0000 mg | ORAL_TABLET | Freq: Every day | ORAL | Status: DC
  Administered 2015-12-11 – 2015-12-13 (×3): 50 mg via ORAL
  Filled 2015-12-11 (×3): qty 1

## 2015-12-11 MED ORDER — HYDROCODONE-ACETAMINOPHEN 5-325 MG PO TABS: 1.0000 | ORAL_TABLET | Freq: Three times a day (TID) | ORAL | Status: DC | PRN

## 2015-12-11 MED ORDER — ASPIRIN EC 81 MG PO TBEC
81.0000 mg | DELAYED_RELEASE_TABLET | Freq: Every day | ORAL | Status: DC
  Administered 2015-12-11 – 2015-12-12 (×2): 81 mg via ORAL
  Filled 2015-12-11 (×2): qty 1

## 2015-12-11 MED ORDER — METFORMIN HCL 500 MG PO TABS
500.0000 mg | ORAL_TABLET | Freq: Two times a day (BID) | ORAL | Status: DC
  Administered 2015-12-11 – 2015-12-13 (×4): 500 mg via ORAL
  Filled 2015-12-11 (×4): qty 1

## 2015-12-11 MED ORDER — LOSARTAN POTASSIUM 25 MG PO TABS: 25.0000 mg | ORAL_TABLET | Freq: Every day | ORAL | Status: DC

## 2015-12-11 MED ORDER — CETYLPYRIDINIUM CHLORIDE 0.05 % MT LIQD
7.0000 mL | Freq: Two times a day (BID) | OROMUCOSAL | Status: DC
  Administered 2015-12-11 – 2015-12-13 (×4): 7 mL via OROMUCOSAL

## 2015-12-11 MED ORDER — LAMOTRIGINE 25 MG PO TABS
25.0000 mg | ORAL_TABLET | Freq: Two times a day (BID) | ORAL | Status: DC
  Administered 2015-12-11 – 2015-12-13 (×4): 25 mg via ORAL
  Filled 2015-12-11 (×4): qty 1

## 2015-12-11 MED ORDER — LEVOFLOXACIN IN D5W 750 MG/150ML IV SOLN
750.0000 mg | Freq: Once | INTRAVENOUS | Status: AC
  Administered 2015-12-11: 750 mg via INTRAVENOUS
  Filled 2015-12-11: qty 150

## 2015-12-11 MED ORDER — PIPERACILLIN-TAZOBACTAM 3.375 G IVPB
3.3750 g | Freq: Three times a day (TID) | INTRAVENOUS | Status: DC
  Administered 2015-12-11 – 2015-12-12 (×3): 3.375 g via INTRAVENOUS
  Filled 2015-12-11 (×5): qty 50

## 2015-12-11 MED ORDER — ALBUTEROL SULFATE (2.5 MG/3ML) 0.083% IN NEBU: 2.5000 mg | INHALATION_SOLUTION | Freq: Four times a day (QID) | RESPIRATORY_TRACT | Status: DC | PRN

## 2015-12-11 MED ORDER — TAMSULOSIN HCL 0.4 MG PO CAPS
0.4000 mg | ORAL_CAPSULE | Freq: Every day | ORAL | Status: DC
  Administered 2015-12-11 – 2015-12-13 (×3): 0.4 mg via ORAL
  Filled 2015-12-11 (×3): qty 1

## 2015-12-11 NOTE — Progress Notes (Signed)
ANTICOAGULATION CONSULT NOTE - Initial Consult  Pharmacy Consult for Warfarin  Indication: DVT prophylaxis.   Allergies  Allergen Reactions  . Other Other (See Comments)    Dizziness, lightheadedness, Pt states that inhaled medications make him depressed and have negative thoughts.      Patient Measurements: Height: 5\' 10"  (177.8 cm) Weight: 159 lb (72.122 kg) IBW/kg (Calculated) : 73 Heparin Dosing Weight:   Vital Signs: Temp: 97.7 F (36.5 C) (06/06 1713) Temp Source: Oral (06/06 1713) BP: 158/77 mmHg (06/06 1713) Pulse Rate: 71 (06/06 1713)  Labs:  Recent Labs  12/11/15 1025  HGB 10.7*  HCT 33.6*  PLT 207  LABPROT 23.7*  INR 2.13  CREATININE 0.83    Estimated Creatinine Clearance: 84.5 mL/min (by C-G formula based on Cr of 0.83).   Medical History: Past Medical History  Diagnosis Date  . Barrett esophagus   . Hypertension   . Hyperlipidemia   . Multiple pulmonary nodules   . CHF (congestive heart failure) ([place])   . Lumbar spinal stenosis   . COPD (chronic obstructive pulmonary disease) ([place])   . CAD (coronary artery disease)     on 2l home oxygen  . On home oxygen therapy   . DVT (deep venous thrombosis) ([place]) 11/2013; 02/01/2015    on coumadin  . Parkinson's disease ([place])   . Recurrent aspiration pneumonia ([place])   . Sleep apnea   . Hypothyroidism   . Type II diabetes mellitus ([place])   . Iron deficiency anemia   . GERD (gastroesophageal reflux disease)   . Seizures ([place])   . DJD (degenerative joint disease)   . Arthritis   . Chronic lower back pain   . History of gout   . Depression   . Anxiety   . Basal cell carcinoma   . Throat cancer ([place])   . Heart attack ([place]) 03/20/1995  . 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 acetaminophen (TYLENOL) 500 MG tablet Take 500 mg by mouth every 6 (six) hours as  needed.   PRN at Unknown time  . ARIPiprazole (ABILIFY) 5 MG tablet Take 5 mg by mouth 2 (two) times daily.   12/11/2015 at 0800  . Artificial Tear Ointment (DRY EYES OP) Place 1 drop into both eyes daily as needed (dry eyes).   PRN at Unknown time  . aspirin EC 81 MG tablet Take 81 mg by mouth at bedtime.    12/10/2015 at 2300  . atorvastatin (LIPITOR) 40 MG tablet Take 40 mg by mouth at bedtime.   12/10/2015 at 2300  . bisacodyl (DULCOLAX) 10 MG suppository Place 1 suppository (10 mg total) rectally daily as needed for moderate constipation. 12 suppository 0 PRN at Unknown time  . bisoprolol (ZEBETA) 10 MG tablet Take 10 mg by mouth daily.   12/11/2015 at 0800  . carbidopa-levodopa (SINEMET IR) 25-250 MG tablet Take 1 tablet by mouth 3 (three) times daily.   12/11/2015 at 0800  . clonazePAM (KLONOPIN) 1 MG tablet Take 1 mg by mouth at bedtime.   12/10/2015 at 2300  . fentaNYL (DURAGESIC - DOSED MCG/HR) 50 MCG/HR Place 1 patch (50 mcg total) onto the skin every 3 (three) days. 10 patch 0 Past Week at Unknown time  . HYDROcodone-acetaminophen (NORCO/VICODIN) 5-325 MG tablet Take 1 tablet by mouth every 8 (eight) hours as needed for moderate  pain.   PRN at Unknown time  . lamoTRIgine (LAMICTAL) 25 MG tablet Take 25 mg by mouth 2 (two) times daily.   12/11/2015 at 0800  . levothyroxine (SYNTHROID, LEVOTHROID) 137 MCG tablet Take 137 mcg by mouth daily.   12/11/2015 at 0800  . losartan (COZAAR) 25 MG tablet Take 1 tablet (25 mg total) by mouth daily. 30 tablet 11 12/11/2015 at 0800  . losartan (COZAAR) 50 MG tablet Take 1 tablet by mouth daily.   12/11/2015 at 0800  . metFORMIN (GLUCOPHAGE) 500 MG tablet Take 500 mg by mouth 2 (two) times daily.   12/11/2015 at 0800  . nitroGLYCERIN (NITROSTAT) 0.4 MG SL tablet Place 0.4 mg under the tongue every 5 (five) minutes x 3 doses as needed for chest pain.    PRN at Unknown time  . omeprazole (PRILOSEC) 20 MG capsule Take 40 mg by mouth 2 (two) times daily before a meal.   12/11/2015  at 0800  . PARoxetine (PAXIL) 20 MG tablet Take 60 mg by mouth daily.    12/11/2015 at 0800  . tamsulosin (FLOMAX) 0.4 MG CAPS Take 0.4 mg by mouth daily.    12/11/2015 at 0800  . tiZANidine (ZANAFLEX) 4 MG tablet Take 4 mg by mouth every 8 (eight) hours as needed for muscle spasms.   PRN at Unknown time  . triamcinolone cream (KENALOG) 0.5 % Apply 1 application topically 2 (two) times daily as needed.    PRN  . warfarin (COUMADIN) 5 MG tablet Take 1 tablet (5 mg total) by mouth daily. 30 tablet 11 12/11/2015 at 0800  . clindamycin (CLEOCIN) 300 MG capsule Take 1 capsule (300 mg total) by mouth every 8 (eight) hours. (Patient not taking: Reported on 12/11/2015) 15 capsule 0     Assessment: Pharmacy consulted to manage warfarin in this 70 year old male with hx of DVT.  Pt was on warfarin 5 mg PO daily at home. 6/6 :  INR = 2.13 Pt on Zosyn 3.375 gm IV Q8H EI  Goal of Therapy:  INR 2-3   Plan:  Will continue this pt on home dose of warfarin 5 mg PO daily. Will check INR daily as this pt is on abx. Next INR will be drawn on 6/7 @ 5:00.   Sofia Vanmeter D 12/11/2015,8:13 PM

## 2015-12-11 NOTE — Progress Notes (Signed)
ANTIBIOTIC CONSULT NOTE - INITIAL  Pharmacy Consult for Zosyn  Indication: pneumonia  Allergies  Allergen Reactions  . Other Other (See Comments)    Dizziness, lightheadedness, Pt states that inhaled medications make him depressed and have negative thoughts.      Patient Measurements: Height: 5\' 10"  (177.8 cm) Weight: 159 lb (72.122 kg) IBW/kg (Calculated) : 73 Adjusted Body Weight:   Vital Signs: Temp: 97.7 F (36.5 C) (06/06 1713) Temp Source: Oral (06/06 1713) BP: 158/77 mmHg (06/06 1713) Pulse Rate: 71 (06/06 1713) Intake/Output from previous day:   Intake/Output from this shift:    Labs:  Recent Labs  12/11/15 1025  WBC 14.0*  HGB 10.7*  PLT 207  CREATININE 0.83   Estimated Creatinine Clearance: 84.5 mL/min (by C-G formula based on Cr of 0.83). No results for input(s): VANCOTROUGH, VANCOPEAK, VANCORANDOM, GENTTROUGH, GENTPEAK, GENTRANDOM, TOBRATROUGH, TOBRAPEAK, TOBRARND, AMIKACINPEAK, AMIKACINTROU, AMIKACIN in the last 72 hours.   Microbiology: Recent Results (from the past 720 hour(s))  Urine culture     Status: None   Collection Time: 11/29/15  3:01 PM  Result Value Ref Range Status   Specimen Description URINE, RANDOM  Final   Special Requests NONE  Final   Culture NO GROWTH Performed at [facility]   Final   Report Status 11/30/2015 FINAL  Final  Blood Culture (routine x 2)     Status: None   Collection Time: 11/29/15  3:12 PM  Result Value Ref Range Status   Specimen Description BLOOD LEFT FOREARM  Final   Special Requests   Final    BOTTLES DRAWN AEROBIC AND ANAEROBIC AER 3ML ANA 1ML   Culture NO GROWTH 5 DAYS  Final   Report Status 12/04/2015 FINAL  Final  Blood Culture (routine x 2)     Status: None   Collection Time: 11/29/15  3:56 PM  Result Value Ref Range Status   Specimen Description BLOOD RIGHT ARM  Final   Special Requests   Final    BOTTLES DRAWN AEROBIC AND ANAEROBIC AER 3ML ANA .5ML   Culture NO GROWTH 5 DAYS  Final    Report Status 12/04/2015 FINAL  Final    Medical History: Past Medical History  Diagnosis Date  . Barrett esophagus   . Hypertension   . Hyperlipidemia   . Multiple pulmonary nodules   . CHF (congestive heart failure) ([place])   . Lumbar spinal stenosis   . COPD (chronic obstructive pulmonary disease) ([place])   . CAD (coronary artery disease)     on 2l home oxygen  . On home oxygen therapy   . DVT (deep venous thrombosis) ([place]) 11/2013; 02/01/2015    on coumadin  . Parkinson's disease ([place])   . Recurrent aspiration pneumonia ([place])   . Sleep apnea   . Hypothyroidism   . Type II diabetes mellitus ([place])   . Iron deficiency anemia   . GERD (gastroesophageal reflux disease)   . Seizures ([place])   . DJD (degenerative joint disease)   . Arthritis   . Chronic lower back pain   . History of gout   . Depression   . Anxiety   . Basal cell carcinoma   . Throat cancer ([place])   . Heart attack ([place]) 03/20/1995  . 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 acetaminophen (TYLENOL) 500 MG tablet Take 500 mg  by mouth every 6 (six) hours as needed.   PRN at Unknown time  . ARIPiprazole (ABILIFY) 5 MG tablet Take 5 mg by mouth 2 (two) times daily.   12/11/2015 at 0800  . Artificial Tear Ointment (DRY EYES OP) Place 1 drop into both eyes daily as needed (dry eyes).   PRN at Unknown time  . aspirin EC 81 MG tablet Take 81 mg by mouth at bedtime.    12/10/2015 at 2300  . atorvastatin (LIPITOR) 40 MG tablet Take 40 mg by mouth at bedtime.   12/10/2015 at 2300  . bisacodyl (DULCOLAX) 10 MG suppository Place 1 suppository (10 mg total) rectally daily as needed for moderate constipation. 12 suppository 0 PRN at Unknown time  . bisoprolol (ZEBETA) 10 MG tablet Take 10 mg by mouth daily.   12/11/2015 at 0800  . carbidopa-levodopa (SINEMET IR) 25-250 MG tablet Take 1 tablet by mouth 3  (three) times daily.   12/11/2015 at 0800  . clonazePAM (KLONOPIN) 1 MG tablet Take 1 mg by mouth at bedtime.   12/10/2015 at 2300  . fentaNYL (DURAGESIC - DOSED MCG/HR) 50 MCG/HR Place 1 patch (50 mcg total) onto the skin every 3 (three) days. 10 patch 0 Past Week at Unknown time  . HYDROcodone-acetaminophen (NORCO/VICODIN) 5-325 MG tablet Take 1 tablet by mouth every 8 (eight) hours as needed for moderate pain.   PRN at Unknown time  . lamoTRIgine (LAMICTAL) 25 MG tablet Take 25 mg by mouth 2 (two) times daily.   12/11/2015 at 0800  . levothyroxine (SYNTHROID, LEVOTHROID) 137 MCG tablet Take 137 mcg by mouth daily.   12/11/2015 at 0800  . losartan (COZAAR) 25 MG tablet Take 1 tablet (25 mg total) by mouth daily. 30 tablet 11 12/11/2015 at 0800  . losartan (COZAAR) 50 MG tablet Take 1 tablet by mouth daily.   12/11/2015 at 0800  . metFORMIN (GLUCOPHAGE) 500 MG tablet Take 500 mg by mouth 2 (two) times daily.   12/11/2015 at 0800  . nitroGLYCERIN (NITROSTAT) 0.4 MG SL tablet Place 0.4 mg under the tongue every 5 (five) minutes x 3 doses as needed for chest pain.    PRN at Unknown time  . omeprazole (PRILOSEC) 20 MG capsule Take 40 mg by mouth 2 (two) times daily before a meal.   12/11/2015 at 0800  . PARoxetine (PAXIL) 20 MG tablet Take 60 mg by mouth daily.    12/11/2015 at 0800  . tamsulosin (FLOMAX) 0.4 MG CAPS Take 0.4 mg by mouth daily.    12/11/2015 at 0800  . tiZANidine (ZANAFLEX) 4 MG tablet Take 4 mg by mouth every 8 (eight) hours as needed for muscle spasms.   PRN at Unknown time  . triamcinolone cream (KENALOG) 0.5 % Apply 1 application topically 2 (two) times daily as needed.    PRN  . warfarin (COUMADIN) 5 MG tablet Take 1 tablet (5 mg total) by mouth daily. 30 tablet 11 12/11/2015 at 0800  . clindamycin (CLEOCIN) 300 MG capsule Take 1 capsule (300 mg total) by mouth every 8 (eight) hours. (Patient not taking: Reported on 12/11/2015) 15 capsule 0    Assessment: CrCl = 84.5 ml/min No Pseudomonas risk  factors  Goal of Therapy:  resolution of infection  Plan:  Expected duration 7 days with resolution of temperature and/or normalization of WBC   Will start Zosyn 3.375 gm IV Q8H EI on 6/6.   Edona Schreffler D 12/11/2015,8:11 PM

## 2015-12-11 NOTE — ED Provider Notes (Signed)
[facility] Emergency Department Provider Note   ____________________________________________  Time seen: Approximately 11:40 I have reviewed the triage vital signs and the triage nursing note.  HISTORY  Chief Complaint Rectal Bleeding   Historian Patient and wife  HPI John Mendoza is a 70 y.o. male with history of copd, dvt, chf, mi, and parkinson's, on coumadin, here for confusion this morning.  Wife states that he woke up this morning without his oxygen on and was very confused and he walked to bathroom when she found him there there was bloody underwear in the toilet and she wasn't sure whether or not it was rectal or urinary in origin. Patient is not sure either. He also coughed up bloody phlegm. He was also short of breath. No chest pain but some chest tightness. Some chills without fevers. His symptoms are moderate. Exertion seems to make his breathing worse.    Past Medical History  Diagnosis Date  . Barrett esophagus   . Hypertension   . Hyperlipidemia   . Multiple pulmonary nodules   . CHF (congestive heart failure) ([place])   . Lumbar spinal stenosis   . COPD (chronic obstructive pulmonary disease) ([place])   . CAD (coronary artery disease)     on 2l home oxygen  . On home oxygen therapy   . DVT (deep venous thrombosis) ([place]) 11/2013; 02/01/2015    on coumadin  . Parkinson's disease ([place])   . Recurrent aspiration pneumonia ([place])   . Sleep apnea   . Hypothyroidism   . Type II diabetes mellitus ([place])   . Iron deficiency anemia   . GERD (gastroesophageal reflux disease)   . Seizures ([place])   . DJD (degenerative joint disease)   . Arthritis   . Chronic lower back pain   . History of gout   . Depression   . Anxiety   . Basal cell carcinoma   . Throat cancer ([place])   . Heart attack ([place]) 03/20/1995  . 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  Aspiration pneumonia ([place]) 11/29/2015  . Dehydration 03/07/2015  . Orthostatic hypotension 03/06/2015  . Hypotension 03/06/2015  . Constipation 02/05/2015  . Dysphagia causing pulmonary aspiration with swallowing 02/03/2015  . Diskitis   . 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 Discitis of lumbar region 02/01/2015  . Epidural abscess 02/01/2015  . Warfarin anticoagulation 02/01/2015  . Parkinsons disease ([place]) 02/01/2015  . Vertebral osteomyelitis ([place]) 02/01/2015  . Dysphagia/chronic 02/01/2015  . Bacteremia due to group B Streptococcus 01/28/2015  . Acute on chronic respiratory failure with hypoxia ([place]) 01/26/2015  . Sepsis ([place]) 01/26/2015  . History of DVT (deep vein thrombosis) 02/20/2014  . 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 Multiple pulmonary nodules 01/15/2012  . Aspiration pneumonitis ([place]) 01/15/2012  . Hypertension 01/15/2012  . Barrett's esophagus 01/15/2012  . CAD (coronary artery disease) 01/15/2012  . COPD (chronic obstructive pulmonary disease) ([place]) 01/15/2012    Past Surgical History  Procedure Laterality Date  . Anterior cervical decomp/discectomy fusion  X 2  . Radical neck dissection Right ~ 1998    "throat cancer"  . Total knee arthroplasty Right 2011  . Thyroidectomy  ~ 1996 X 2  . Cataract extraction w/ intraocular lens  implant, bilateral Bilateral   . Tonsillectomy    . Excisional hemorrhoidectomy    . Joint replacement    .  Back surgery    . Posterior fusion cervical spine  X 1  . Coronary angioplasty with stent placement    . Coronary artery bypass graft  1996    "CABG X3"  . Basal cell carcinoma excision      Current Outpatient Rx  Name  Route  Sig  Dispense  Refill  . acetaminophen (TYLENOL) 500 MG tablet   Oral   Take 500 mg by mouth every 6 (six) hours as needed.         . ARIPiprazole (ABILIFY) 5 MG tablet   Oral   Take 5 mg by mouth 2 (two) times daily.         .  Artificial Tear Ointment (DRY EYES OP)   Both Eyes   Place 1 drop into both eyes daily as needed (dry eyes).         Marland Kitchen aspirin EC 81 MG tablet   Oral   Take 81 mg by mouth at bedtime.          Marland Kitchen atorvastatin (LIPITOR) 40 MG tablet   Oral   Take 40 mg by mouth at bedtime.         . bisacodyl (DULCOLAX) 10 MG suppository   Rectal   Place 1 suppository (10 mg total) rectally daily as needed for moderate constipation.   12 suppository   0   . bisoprolol (ZEBETA) 10 MG tablet   Oral   Take 10 mg by mouth daily.         . carbidopa-levodopa (SINEMET IR) 25-250 MG tablet   Oral   Take 1 tablet by mouth 3 (three) times daily.         . clonazePAM (KLONOPIN) 1 MG tablet   Oral   Take 1 mg by mouth at bedtime.         . fentaNYL (DURAGESIC - DOSED MCG/HR) 50 MCG/HR   Transdermal   Place 1 patch (50 mcg total) onto the skin every 3 (three) days.   10 patch   0   . HYDROcodone-acetaminophen (NORCO/VICODIN) 5-325 MG tablet   Oral   Take 1 tablet by mouth every 8 (eight) hours as needed for moderate pain.         Marland Kitchen lamoTRIgine (LAMICTAL) 25 MG tablet   Oral   Take 25 mg by mouth 2 (two) times daily.         Marland Kitchen levothyroxine (SYNTHROID, LEVOTHROID) 137 MCG tablet   Oral   Take 137 mcg by mouth daily.         Marland Kitchen losartan (COZAAR) 25 MG tablet   Oral   Take 1 tablet (25 mg total) by mouth daily.   30 tablet   11   . losartan (COZAAR) 50 MG tablet   Oral   Take 1 tablet by mouth daily.         . metFORMIN (GLUCOPHAGE) 500 MG tablet   Oral   Take 500 mg by mouth 2 (two) times daily.         . nitroGLYCERIN (NITROSTAT) 0.4 MG SL tablet   Sublingual   Place 0.4 mg under the tongue every 5 (five) minutes x 3 doses as needed for chest pain.          Marland Kitchen omeprazole (PRILOSEC) 20 MG capsule   Oral   Take 40 mg by mouth 2 (two) times daily before a meal.         . PARoxetine (PAXIL) 20 MG tablet   Oral  Take 60 mg by mouth daily.          .  tamsulosin (FLOMAX) 0.4 MG CAPS   Oral   Take 0.4 mg by mouth daily.          Marland Kitchen tiZANidine (ZANAFLEX) 4 MG tablet   Oral   Take 4 mg by mouth every 8 (eight) hours as needed for muscle spasms.         Marland Kitchen triamcinolone cream (KENALOG) 0.5 %   Topical   Apply 1 application topically 2 (two) times daily as needed.          . warfarin (COUMADIN) 5 MG tablet   Oral   Take 1 tablet (5 mg total) by mouth daily.   30 tablet   11   . clindamycin (CLEOCIN) 300 MG capsule   Oral   Take 1 capsule (300 mg total) by mouth every 8 (eight) hours. Patient not taking: Reported on 12/11/2015   15 capsule   0     Allergies Other  Family History  Problem Relation Age of Onset  . Leukemia Paternal Grandmother   . Cancer Maternal Grandmother     stomach  . Cancer Paternal Aunt   . Other Father     bacterial endocarditis  . Rheum arthritis Father     Social History Social History  Substance Use Topics  . Smoking status: Former Smoker -- 2.00 packs/day for 35 years    Types: Cigarettes    Quit date: 03/20/1995  . Smokeless tobacco: Never Used  . Alcohol Use: Yes     Comment: 02/01/2015 "stopped drinking in ~ 1996; never had problem w/it"    Review of Systems  Constitutional: Negative for fever.Positive for some chills Eyes: Negative for visual changes. ENT: Negative for sore throat. Cardiovascular: Negative for chest pain. Some chest pressure Respiratory: Positive for shortness of breath. Positive for bloody sputum. Gastrointestinal: Negative for abdominal pain, vomiting and diarrhea. Questionable rectal bleeding versus urinary bleeding. Genitourinary: Negative for dysuria. Musculoskeletal: Negative for back pain. Skin: Negative for rash. Neurological: Negative for headache. 10 point Review of Systems otherwise negative ____________________________________________   PHYSICAL EXAM:  VITAL SIGNS: ED Triage Vitals  Enc Vitals Group     BP 12/11/15 1029 114/68 mmHg      Pulse Rate 12/11/15 1029 85     Resp --      Temp 12/11/15 1029 98.9 F (37.2 C)     Temp Source 12/11/15 1029 Axillary     SpO2 12/11/15 1029 70 %     Weight 12/11/15 1029 159 lb (72.122 kg)     Height 12/11/15 1029 5\' 10"  (1.778 m)     Head Cir --      Peak Flow --      Pain Score --      Pain Loc --      Pain Edu? --      Excl. in [place]? --      Constitutional: Alert and Cooperative, somewhat of a poor historian. Well appearing and in no distress. HEENT   Head: Normocephalic and atraumatic.      Eyes: Conjunctivae are normal. PERRL. Normal extraocular movements.      Ears:         Nose: No congestion/rhinnorhea.   Mouth/Throat: Mucous membranes are moist.   Neck: No stridor. Cardiovascular/Chest: Normal rate, regular rhythm.  No murmurs, rubs, or gallops. Respiratory: Normal respiratory effort without tachypnea nor retractions. Moderate rhonchi throughout all fields especially posteriorly and bases.  No wheezing Gastrointestinal: Soft. No distention, no guarding, no rebound. Nontender.    Genitourinary/rectal: Nontender rectal exam. Clear secretions. A few flecks were heme positive on Hemoccult testing. Musculoskeletal: Nontender with normal range of motion in all extremities. No joint effusions.  No lower extremity tenderness.  No edema. Neurologic:  Normal speech and language. No gross or focal neurologic deficits are appreciated. Skin:  Skin is warm, dry and intact. No rash noted. Psychiatric: Mood and affect are normal. Speech and behavior are normal. Patient exhibits appropriate insight and judgment.  ____________________________________________   EKG I, Lisa Roca, MD, the attending physician have personally viewed and interpreted all ECGs.  89 bpm. normal sinus rhythm. Narrow QRS. Normal axis. T waves inverted inferiorly. Occasional PVC. ____________________________________________  LABS (pertinent positives/negatives)  Labs Reviewed  CBC WITH  DIFFERENTIAL/PLATELET - Abnormal; Notable for the following:    WBC 14.0 (*)    RBC 4.11 (*)    Hemoglobin 10.7 (*)    HCT 33.6 (*)    RDW 16.9 (*)    Neutro Abs 13.3 (*)    Lymphs Abs 0.1 (*)    All other components within normal limits  COMPREHENSIVE METABOLIC PANEL - Abnormal; Notable for the following:    Glucose, Bld 131 (*)    BUN 22 (*)    Calcium 8.6 (*)    ALT 12 (*)    All other components within normal limits  URINALYSIS COMPLETEWITH MICROSCOPIC ([facility] ONLY) - Abnormal; Notable for the following:    Color, Urine YELLOW (*)    APPearance CLEAR (*)    Ketones, ur TRACE (*)    Protein, ur 30 (*)    Leukocytes, UA TRACE (*)    All other components within normal limits  PROTIME-INR - Abnormal; Notable for the following:    Prothrombin Time 23.7 (*)    All other components within normal limits  CULTURE, BLOOD (ROUTINE X 2)  CULTURE, BLOOD (ROUTINE X 2)  URINE CULTURE  LACTIC ACID, PLASMA  LACTIC ACID, PLASMA    ____________________________________________  RADIOLOGY All Xrays were viewed by me. Imaging interpreted by Radiologist.  Chest 1 view:  IMPRESSION: New right upper lobe airspace disease -question pneumonia or aspiration. This may represent hemorrhage with history of fall, but no acute bony abnormality is identified.  Cardiomegaly. __________________________________________  PROCEDURES  Procedure(s) performed: None  Critical Care performed: None  ____________________________________________   ED COURSE / ASSESSMENT AND PLAN  Pertinent labs & imaging results that were available during my care of the patient were reviewed by me and considered in my medical decision making (see chart for details).   This patient was found to be hypoxic in the 70s on room air and placed on nasal cannula but unable to maintain oxygenation in place on nonrebreather.  Patient's chest x-ray does show a new right upper lobe pneumonia versus aspiration. I initially  treated with Levaquin, but will discuss with hospitalist for any additional coverage for possible aspiration.  Wife does state that he recently has failed a swallow study and is taking thickened liquids. He is at risk for aspiration.  Given the chills plus elevated white blood cell count plus hypoxia I will go ahead and place the patient under code sepsis although he has stable heart rate and blood pressure.  With a normal lactate, I am going to treat his fluid resuscitation clinically rather than 30 cc/kg bolus given concern for volume overload with his history of CHF.   In terms of the rectal versus urinary  bleeding, urinalysis shows no red blood cells, and Hemoccult testing was trace positive. His hemoglobin is stable in any case.  CONSULTATIONS:   Dr. Posey Pronto, hospitalist for admission.   Patient / Family / Caregiver informed of clinical course, medical decision-making process, and agree with plan.     ___________________________________________   FINAL CLINICAL IMPRESSION(S) / ED DIAGNOSES   Final diagnoses:  Right upper lobe pneumonia  Hypoxia              Note: This dictation was prepared with Dragon dictation. Any transcriptional errors that result from this process are unintentional   Lisa Roca, MD 12/11/15 1349

## 2015-12-11 NOTE — H&P (Addendum)
[place] [facility] [place] NAME: John Mendoza    MR#:  [id number]  DATE OF BIRTH:  1946/06/03  DATE OF ADMISSION:  12/11/2015  PRIMARY CARE PHYSICIAN: Tama High III, MD   REQUESTING/REFERRING PHYSICIAN: Dr. Reita Cliche  CHIEF COMPLAINT:  Increasing shortness of breath and cough  HISTORY OF PRESENT ILLNESS:  John Mendoza  is a 70 y.o. male with a known history of Hypertension, Parkinson's disease, history of throat cancer status post radiation treatment in the past with history of recurrent aspiration pneumonia in the past requiring a PEG tube placement which was removed after several months on swallow eval was done and patient was placed on honey thick liquids. It seems patient is not compliant at home with his diet and this is the second admission in 2 weeks for recurrent aspiration pneumonia. Patient came in with increasing shortness of breath was found for his sats to be in the lower 80s despite being on home oxygen. His chest x-ray shows significant right upper lobe pneumonia consistent with aspiration given his history of throat cancer radiation and history of recurrent epiglottitis. Denies any pain. Patient received IV Levaquin in the emergency room. Per patient's wife he she found patient's mouth filled up with blood. Patient was found at the end of the bed on the ground this morning. Wife also noticed some blood in the commode this morning. Patient has history of hemorrhoids with bleeding in the past. His hemoglobin is stable at 10.7. He was faintly heme positive stools in the emergency room. PAST MEDICAL HISTORY:   Past Medical History  Diagnosis Date  . Barrett esophagus   . Hypertension   . Hyperlipidemia   . Multiple pulmonary nodules   . CHF (congestive heart failure) ([place])   . Lumbar spinal stenosis   . COPD (chronic obstructive pulmonary disease) ([place])   . CAD (coronary artery disease)     on 2l home oxygen  . On home oxygen  therapy   . DVT (deep venous thrombosis) ([place]) 11/2013; 02/01/2015    on coumadin  . Parkinson's disease ([place])   . Recurrent aspiration pneumonia ([place])   . Sleep apnea   . Hypothyroidism   . Type II diabetes mellitus ([place])   . Iron deficiency anemia   . GERD (gastroesophageal reflux disease)   . Seizures ([place])   . DJD (degenerative joint disease)   . Arthritis   . Chronic lower back pain   . History of gout   . Depression   . Anxiety   . Basal cell carcinoma   . Throat cancer ([place])   . Heart attack ([place]) 03/20/1995  . 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 Anterior cervical decomp/discectomy fusion  X 2  . Radical neck dissection Right ~ 1998    "throat cancer"  . Total knee arthroplasty Right 2011  . Thyroidectomy  ~ 1996 X 2  . Cataract extraction w/ intraocular lens  implant, bilateral Bilateral   . Tonsillectomy    . Excisional hemorrhoidectomy    . Joint replacement    . Back surgery    . Posterior fusion cervical spine  X 1  . Coronary angioplasty with stent placement    . Coronary artery bypass graft  1996    "CABG X3"  . Basal cell carcinoma excision  SOCIAL HISTORY:   Social History  Substance Use Topics  . Smoking status: Former Smoker -- 2.00 packs/day for 35 years    Types: Cigarettes    Quit date: 03/20/1995  . Smokeless tobacco: Never Used  . Alcohol Use: Yes     Comment: 02/01/2015 "stopped drinking in ~ 1996; never had problem w/it"    FAMILY HISTORY:   Family History  Problem Relation Age of Onset  . Leukemia Paternal Grandmother   . Cancer Maternal Grandmother     stomach  . Cancer Paternal Aunt   . Other Father     bacterial endocarditis  . Rheum arthritis Father     DRUG ALLERGIES:   Allergies  Allergen Reactions  . Other Other (See Comments)    Dizziness, lightheadedness, Pt states that inhaled  medications make him depressed and have negative thoughts.      REVIEW OF SYSTEMS:  Review of Systems  Constitutional: Negative for fever, chills and weight loss.  HENT: Negative for ear discharge, ear pain and nosebleeds.   Eyes: Negative for blurred vision, pain and discharge.  Respiratory: Positive for cough and shortness of breath. Negative for sputum production, wheezing and stridor.   Cardiovascular: Negative for chest pain, palpitations, orthopnea and PND.  Gastrointestinal: Positive for blood in stool. Negative for nausea, vomiting, abdominal pain and diarrhea.  Genitourinary: Negative for urgency and frequency.  Musculoskeletal: Negative for back pain and joint pain.  Neurological: Positive for tremors and weakness. Negative for sensory change, speech change and focal weakness.  Psychiatric/Behavioral: Negative for depression and hallucinations. The patient is not nervous/anxious.   All other systems reviewed and are negative.    MEDICATIONS AT HOME:   Prior to Admission medications   Medication Sig Start Date End Date Taking? Authorizing Provider  acetaminophen (TYLENOL) 500 MG tablet Take 500 mg by mouth every 6 (six) hours as needed.   Yes Historical Provider, MD  ARIPiprazole (ABILIFY) 5 MG tablet Take 5 mg by mouth 2 (two) times daily.   Yes Historical Provider, MD  Artificial Tear Ointment (DRY EYES OP) Place 1 drop into both eyes daily as needed (dry eyes).   Yes Historical Provider, MD  aspirin EC 81 MG tablet Take 81 mg by mouth at bedtime.    Yes Historical Provider, MD  atorvastatin (LIPITOR) 40 MG tablet Take 40 mg by mouth at bedtime.   Yes Historical Provider, MD  bisacodyl (DULCOLAX) 10 MG suppository Place 1 suppository (10 mg total) rectally daily as needed for moderate constipation. 02/01/15  Yes Tama High III, MD  bisoprolol (ZEBETA) 10 MG tablet Take 10 mg by mouth daily.   Yes Historical Provider, MD  carbidopa-levodopa (SINEMET IR) 25-250 MG tablet Take 1  tablet by mouth 3 (three) times daily.   Yes Historical Provider, MD  clonazePAM (KLONOPIN) 1 MG tablet Take 1 mg by mouth at bedtime. 09/20/14  Yes Historical Provider, MD  fentaNYL (DURAGESIC - DOSED MCG/HR) 50 MCG/HR Place 1 patch (50 mcg total) onto the skin every 3 (three) days. 02/08/15  Yes Orson Eva, MD  HYDROcodone-acetaminophen (NORCO/VICODIN) 5-325 MG tablet Take 1 tablet by mouth every 8 (eight) hours as needed for moderate pain.   Yes Historical Provider, MD  lamoTRIgine (LAMICTAL) 25 MG tablet Take 25 mg by mouth 2 (two) times daily.   Yes Historical Provider, MD  levothyroxine (SYNTHROID, LEVOTHROID) 137 MCG tablet Take 137 mcg by mouth daily.   Yes Historical Provider, MD  losartan (COZAAR) 25 MG tablet Take  1 tablet (25 mg total) by mouth daily. 03/08/15  Yes Tama High III, MD  losartan (COZAAR) 50 MG tablet Take 1 tablet by mouth daily. 11/19/15  Yes Historical Provider, MD  metFORMIN (GLUCOPHAGE) 500 MG tablet Take 500 mg by mouth 2 (two) times daily. 12/12/14  Yes Historical Provider, MD  nitroGLYCERIN (NITROSTAT) 0.4 MG SL tablet Place 0.4 mg under the tongue every 5 (five) minutes x 3 doses as needed for chest pain.    Yes Historical Provider, MD  omeprazole (PRILOSEC) 20 MG capsule Take 40 mg by mouth 2 (two) times daily before a meal.   Yes Historical Provider, MD  PARoxetine (PAXIL) 20 MG tablet Take 60 mg by mouth daily.  07/30/10  Yes Historical Provider, MD  tamsulosin (FLOMAX) 0.4 MG CAPS Take 0.4 mg by mouth daily.    Yes Historical Provider, MD  tiZANidine (ZANAFLEX) 4 MG tablet Take 4 mg by mouth every 8 (eight) hours as needed for muscle spasms.   Yes Historical Provider, MD  triamcinolone cream (KENALOG) 0.5 % Apply 1 application topically 2 (two) times daily as needed.  11/07/15  Yes Historical Provider, MD  warfarin (COUMADIN) 5 MG tablet Take 1 tablet (5 mg total) by mouth daily. 03/08/15  Yes Tama High III, MD  clindamycin (CLEOCIN) 300 MG capsule Take 1 capsule (300 mg  total) by mouth every 8 (eight) hours. Patient not taking: Reported on 12/11/2015 12/01/15   Loletha Grayer, MD      VITAL SIGNS:  Blood pressure 114/68, pulse 85, temperature 98.9 F (37.2 C), temperature source Axillary, height 5\' 10"  (1.778 m), weight 72.122 kg (159 lb), SpO2 99 %.  PHYSICAL EXAMINATION:  GENERAL:  70 y.o.-year-old patient lying in the bed with no acute distress.  EYES: Pupils equal, round, reactive to light and accommodation. No scleral icterus. Extraocular muscles intact.  HEENT: Head atraumatic, normocephalic. Oropharynx and nasopharynx clear.  NECK:  Supple, no jugular venous distention. No thyroid enlargement, no tenderness.  LUNGS: Coarse breath sounds breath sounds bilaterally, no wheezing, rales,rhonchi or crepitation. No use of accessory muscles of respiration.  CARDIOVASCULAR: S1, S2 normal. No murmurs, rubs, or gallops.  ABDOMEN: Soft, nontender, nondistended. Bowel sounds present. No organomegaly or mass.  EXTREMITIES: No pedal edema, cyanosis, or clubbing. Patient has resting tremors in his right arm. NEUROLOGIC: Cranial nerves II through XII are intact. Muscle strength 5/5 in all extremities. Sensation intact. Gait not checked. Subjectively weak PSYCHIATRIC: The patient is alert and oriented x 3.  SKIN: No obvious rash, lesion, or ulcer.   LABORATORY PANEL:   CBC  Recent Labs Lab 12/11/15 1025  WBC 14.0*  HGB 10.7*  HCT 33.6*  PLT 207   ------------------------------------------------------------------------------------------------------------------  Chemistries   Recent Labs Lab 12/11/15 1025  NA 138  K 4.1  CL 103  CO2 29  GLUCOSE 131*  BUN 22*  CREATININE 0.83  CALCIUM 8.6*  AST 17  ALT 12*  ALKPHOS 76  BILITOT 0.4   RADIOLOGY:  Dg Chest 1 View  12/11/2015  CLINICAL DATA:  70 year old male with fall, shortness of breath and rectal bleeding today. EXAM: CHEST 1 VIEW COMPARISON:  11/09/2015 and prior exams FINDINGS: Cardiomegaly  CABG changes identified. New right upper lobe airspace disease noted. There is no evidence of pneumothorax, pleural effusion or acute bony abnormality. IMPRESSION: New right upper lobe airspace disease -question pneumonia or aspiration. This may represent hemorrhage with history of fall, but no acute bony abnormality is identified. Cardiomegaly. Electronically Signed  By: Margarette Canada M.D.   On: 12/11/2015 10:45    EKG:   Normal sinus rhythm, PVCs IMPRESSION AND PLAN:   John Mendoza  is a 70 y.o. male with a known history of Hypertension, Parkinson's disease, history of throat cancer status post radiation treatment in the past with history of recurrent aspiration pneumonia in the past requiring a PEG tube placement which was removed after several months on swallow eval was done and patient was placed on honey thick liquids. It seems patient is not compliant at home with his diet and this is the second admission in 2 weeks for recurrent aspiration pneumonia.  1. Acute on chronic hypoxic respiratory failure secondary to recurrent aspiration pneumonia. Patient has chronic home oxygen 2 L nasal cannula -Patient was just discharged on 31st May with above symptoms -It appears patient is noncompliant with his liquids. He is supposed to be on honey thick liquids. -Patient had PEG tube that was removed about 6 months ago. PEG tube was placed secondary to recurrent aspiration pneumonia from throat cancer and radiation given to him and the past. It was originally placed at [facility]. He has history of aspiration pneumonias in the past -IV Zosyn -Follow blood cultures -Speech therapy to see patient -I had a long conversation with patient and his wife. In the event these aspirations continue patient will need PEG tube for feeding unless he decides he does not want it and wanted to eat food for pleasure knowing he can have life-threatening infections  2. History of throat cancer with radiation in the past and  history of recurrent epiglottitis requiring PEG tube placement  3. Parkinson's disease -Continue levodopa and carbidopa  4. Hypertension -On losartan  5. Hypothyroidism continue Synthroid  6. Hyperlipidemia continue Lipitor  7. Generalized weakness deconditioning We'll consider PT OT and social worker for discharge planning  8. DVT prophylaxis already on Coumadin  9. H/o DVT on coumadin  All the records are reviewed and case discussed with ED provider. Management plans discussed with the patient, family and they are in agreement.  CODE STATUS: Full this was discussed with patient and wife  TOTAL TIME TAKING CARE OF THIS PATIENT: 50 minutes.    Zephyra Bernardi M.D on 12/11/2015 at 2:10 PM  Between 7am to 6pm - Pager - [phone]  After 6pm go to [email or web address] - password [email or web address] [facility]  [place] [facility]  Office  [phone]  CC: Primary care physician; Adin Hector, MD

## 2015-12-11 NOTE — ED Notes (Signed)
Ems from home s/p fall yesterday, had rectal bleeding at home this am per ems. Pt with ra sat of 70 % here. Normally 2LNC as needed. 95% NRB

## 2015-12-12 DIAGNOSIS — E440 Moderate protein-calorie malnutrition: Secondary | ICD-10-CM | POA: Insufficient documentation

## 2015-12-12 LAB — PROTIME-INR
INR: 2.3
PROTHROMBIN TIME: 25.1 s — AB (ref 11.4–15.0)

## 2015-12-12 LAB — CBC
HCT: 29.9 % — ABNORMAL LOW (ref 40.0–52.0)
Hemoglobin: 9.6 g/dL — ABNORMAL LOW (ref 13.0–18.0)
MCH: 26 pg (ref 26.0–34.0)
MCHC: 32 g/dL (ref 32.0–36.0)
MCV: 81.1 fL (ref 80.0–100.0)
PLATELETS: 186 10*3/uL (ref 150–440)
RBC: 3.68 MIL/uL — ABNORMAL LOW (ref 4.40–5.90)
RDW: 17.3 % — AB (ref 11.5–14.5)
WBC: 12.2 10*3/uL — AB (ref 3.8–10.6)

## 2015-12-12 LAB — URINE CULTURE: CULTURE: NO GROWTH

## 2015-12-12 MED ORDER — SODIUM CHLORIDE 0.9 % IV SOLN
3.0000 g | Freq: Four times a day (QID) | INTRAVENOUS | Status: DC
  Administered 2015-12-12 – 2015-12-13 (×3): 3 g via INTRAVENOUS
  Filled 2015-12-12 (×6): qty 3

## 2015-12-12 NOTE — Evaluation (Signed)
Physical Therapy Evaluation Patient Details Name: John Mendoza MRN: [id number] DOB: [birth year] Today's Date: 12/12/2015   History of Present Illness  Pt is a 70 y/o male that presents with aspiration pneumonia in R upper lobe. Pt has a history of Parkinson's, chronic LBP, on 2-3 L of O2 at home.   Clinical Impression  Patient was recently discharged after admission for aspiration pneumonia, it appears due to feeding difficulties. He reports he fell at home prior to this admission, he has had several other falls at home as well. He requires chronic O2, however when PT entered the room patient was at 85% on 4L of O2. No change once in sitting, upped to 6L on portable tank in which patient increased from 82-85% to 88-92%. Patient typically uses SPC for ambulation, not present today thus used RW with no buckling or overt loss of balance though cuing for foot placement required. After ambulation O2 sats between 95-98% on 6L, reduced to 4L and maintained at 95% until PT exited room. Given his medical history, recent admission he would benefit from HHPT to limit deconditioning and perform home safety eval.     Follow Up Recommendations Home health PT    Equipment Recommendations       Recommendations for Other Services       Precautions / Restrictions Precautions Precautions: Fall Restrictions Weight Bearing Restrictions: No      Mobility  Bed Mobility Overal bed mobility: Modified Independent             General bed mobility comments: Patient is able to perform bed mobility with prolonged time but no deficits otherwise.   Transfers Overall transfer level: Modified independent Equipment used: Rolling walker (2 wheeled)             General transfer comment: Patient is able to transfer sit to stand with RW (no cane present) with no balance concerns.   Ambulation/Gait Ambulation/Gait assistance: Supervision Ambulation Distance (Feet): 120 Feet Assistive device: Rolling  walker (2 wheeled)     Gait velocity interpretation: Below normal speed for age/gender General Gait Details: Patient has very mild shuffling with gait and requires cuing for foot placement with use of RW (occasionally has feet outside RW). No loss of balance noted.   Stairs            Wheelchair Mobility    Modified Rankin (Stroke Patients Only)       Balance Overall balance assessment: Needs assistance Sitting-balance support: No upper extremity supported Sitting balance-Leahy Scale: Good     Standing balance support: Bilateral upper extremity supported Standing balance-Leahy Scale: Good                               Pertinent Vitals/Pain Pain Assessment:  (Pt reports some pain in both calves, has had this for a long time. Very mild abdominal pain, again reports this is not new. )    Home Living Family/patient expects to be discharged to:: Private residence Living Arrangements: Spouse/significant other Available Help at Discharge: Family;Available PRN/intermittently Type of Home: House Home Access: Stairs to enter Entrance Stairs-Rails: Right;Left;Can reach both Entrance Stairs-Number of Steps: 3 Home Layout: One level Home Equipment: Cane - single point;Walker - 2 wheels;Bedside commode;Shower seat;Shower seat - built in      Prior Function Level of Independence: Independent with assistive device(s)         Comments: Intermittent use of SPC "when back hurts".  Hand Dominance        Extremity/Trunk Assessment   Upper Extremity Assessment: Overall WFL for tasks assessed           Lower Extremity Assessment: Overall WFL for tasks assessed         Communication   Communication: No difficulties  Cognition Arousal/Alertness: Awake/alert Behavior During Therapy: WFL for tasks assessed/performed Overall Cognitive Status: Within Functional Limits for tasks assessed                      General Comments      Exercises         Assessment/Plan    PT Assessment Patient needs continued PT services  PT Diagnosis Generalized weakness   PT Problem List Decreased strength;Cardiopulmonary status limiting activity;Decreased balance  PT Treatment Interventions DME instruction;Gait training;Stair training;Therapeutic activities;Therapeutic exercise;Balance training   PT Goals (Current goals can be found in the Care Plan section) Acute Rehab PT Goals Patient Stated Goal: Go home PT Goal Formulation: With patient Time For Goal Achievement: 12/26/15 Potential to Achieve Goals: Good    Frequency Min 2X/week   Barriers to discharge        Co-evaluation               End of Session Equipment Utilized During Treatment: Gait belt;Oxygen Activity Tolerance: Patient tolerated treatment well Patient left: with bed alarm set;with call bell/phone within reach;in bed           Time: [id number] PT Time Calculation (min) (ACUTE ONLY): 24 min   Charges:   PT Evaluation $PT Eval Moderate Complexity: 1 Procedure     PT G Codes:       Kerman Passey, PT, DPT    12/12/2015, 3:22 PM

## 2015-12-12 NOTE — Progress Notes (Signed)
[place] at [place] NAME: John Mendoza    MR#:  [id number]  DATE OF BIRTH:  [birth year]  SUBJECTIVE:   Patient here due to shortness of breath and cough secondary to aspiration pneumonia. Shortness of breath is improved. No chest pain, no fever.  REVIEW OF SYSTEMS:    Review of Systems  Constitutional: Negative for fever and chills.  HENT: Negative for congestion and tinnitus.   Eyes: Negative for blurred vision and double vision.  Respiratory: Positive for cough and shortness of breath. Negative for wheezing.   Cardiovascular: Negative for chest pain, orthopnea and PND.  Gastrointestinal: Negative for nausea, vomiting, abdominal pain and diarrhea.  Genitourinary: Negative for dysuria and hematuria.  Neurological: Negative for dizziness, sensory change and focal weakness.  All other systems reviewed and are negative.   Nutrition: Dysphagia 1 with nectar thick liquid Tolerating Diet: Yes Tolerating PT: Await evaluation    DRUG ALLERGIES:   Allergies  Allergen Reactions  . Other Other (See Comments)    Dizziness, lightheadedness, Pt states that inhaled medications make him depressed and have negative thoughts.      VITALS:  Blood pressure 140/67, pulse 62, temperature 97.8 F (36.6 C), temperature source Oral, resp. rate 18, height 5\' 10"  (1.778 m), weight 72.122 kg (159 lb), SpO2 94 %.  PHYSICAL EXAMINATION:   Physical Exam  GENERAL:  70 y.o.-year-old patient lying in the bed in no acute distress.  EYES: Pupils equal, round, reactive to light and accommodation. No scleral icterus. Extraocular muscles intact.  HEENT: Head atraumatic, normocephalic. Oropharynx and nasopharynx clear.  NECK:  Supple, no jugular venous distention. No thyroid enlargement, no tenderness.  LUNGS: Normal breath sounds bilaterally, no wheezing, rales, rhonchi. No use of accessory muscles of respiration.  CARDIOVASCULAR: S1, S2 normal. No murmurs, rubs,  or gallops.  ABDOMEN: Soft, nontender, nondistended. Bowel sounds present. No organomegaly or mass.  EXTREMITIES: No cyanosis, clubbing or edema b/l.    NEUROLOGIC: Cranial nerves II through XII are intact. No focal Motor or sensory deficits b/l. + Parkinsonian Tremor.  PSYCHIATRIC: The patient is alert and oriented x 3. Good affect.  SKIN: No obvious rash, lesion, or ulcer.    LABORATORY PANEL:   CBC  Recent Labs Lab 12/12/15 0644  WBC 12.2*  HGB 9.6*  HCT 29.9*  PLT 186   ------------------------------------------------------------------------------------------------------------------  Chemistries   Recent Labs Lab 12/11/15 1025  NA 138  K 4.1  CL 103  CO2 29  GLUCOSE 131*  BUN 22*  CREATININE 0.83  CALCIUM 8.6*  AST 17  ALT 12*  ALKPHOS 76  BILITOT 0.4   ------------------------------------------------------------------------------------------------------------------  Cardiac Enzymes No results for input(s): TROPONINI in the last 168 hours. ------------------------------------------------------------------------------------------------------------------  RADIOLOGY:  Dg Chest 1 View  12/11/2015  CLINICAL DATA:  70 year old male with fall, shortness of breath and rectal bleeding today. EXAM: CHEST 1 VIEW COMPARISON:  11/09/2015 and prior exams FINDINGS: Cardiomegaly CABG changes identified. New right upper lobe airspace disease noted. There is no evidence of pneumothorax, pleural effusion or acute bony abnormality. IMPRESSION: New right upper lobe airspace disease -question pneumonia or aspiration. This may represent hemorrhage with history of fall, but no acute bony abnormality is identified. Cardiomegaly. Electronically Signed   By: Margarette Canada M.D.   On: 12/11/2015 10:45     ASSESSMENT AND PLAN:   John Mendoza is a 70 y.o. male with a known history of Hypertension, Parkinson's disease, history of throat cancer status post radiation treatment  in the past with  history of recurrent aspiration pneumonia in the past requiring a PEG tube placement which was removed after several months on swallow eval was done and patient was placed on honey thick liquids. It seems patient is not compliant at home with his diet and this is the second admission in 2 weeks for recurrent aspiration pneumonia.  1. Acute on chronic hypoxic respiratory failure secondary to recurrent aspiration pneumonia -Patient was just discharged on 31st May with above symptoms -Continue IV Zosyn. Await speech evaluation. Continue dysphagia 1 with nectar thick liquid diet. -Shortness of breath has improved.  2. History of throat cancer with radiation in the past and history of recurrent epiglottitis requiring PEG tube placement - if continues to aspirate then ?? May need PEG tube placed back again.  Will monitor.   3. Parkinson's disease -Continue Sinemet  4. Hypertension- stable. -On losartan  5. Hypothyroidism continue Synthroid  6. Hyperlipidemia continue Lipitor  7. Generalized weakness/deconditioning - await PT eval.    8. Chronic pain - cont. Fentanyl Patch.   9. BPH - cont. Flomax.   10.  Anxiety - cont. Paxil.   11. Hx of previous DVT - cont. Coumadin.   All the records are reviewed and case discussed with Care Management/Social Workerr. Management plans discussed with the patient, family and they are in agreement.  CODE STATUS: Full  DVT Prophylaxis: Coumadin  TOTAL TIME TAKING CARE OF THIS PATIENT: 30 minutes.   POSSIBLE D/C IN 1-2 DAYS, DEPENDING ON CLINICAL CONDITION.   Henreitta Leber M.D on 12/12/2015 at 1:29 PM  Between 7am to 6pm - Pager - [phone]  After 6pm go to [email or web address] - password [email or web address] [facility]  [place] [facility]  Office  [phone]  CC: Primary care physician; Adin Hector, MD

## 2015-12-12 NOTE — Care Management (Signed)
Admitted to [facility] with the diagnosis of aspiration pneumonia. Lives with wife, Romie Minus [phone]). See Dr Ramonita Lab last week. Chronic home oxygen through [facility] 2 liters per nasal cannula last 6-8 months. Sometimes bumps up to 3 liters, if needed. Uses a rolling walker to aid in ambulation.[facility] in the past. No skilled facilities in the past. Last fall was yesterday, Decreased appetite since August of last year. Gets prescriptions filled at [facility]. Sees Dr. Manuella Ghazi for seizure activity. Last seizure was 3 months ago. Takes care of all basic activities of daily living himself, doesn't drive. Wife does driving and errands. Wife will transport. Shelbie Ammons RN MSN CCM Care Management [phone]

## 2015-12-12 NOTE — Progress Notes (Signed)
Initial Nutrition Assessment  DOCUMENTATION CODES:   Non-severe (moderate) malnutrition in context of chronic illness  INTERVENTION:   Cater to pt preferences on Dysphagia diet order with thickened liquids, SLP following Recommend Mighty Shakes on meal trays TID and Magic Cup BID for added nutrition (each supplement provides approximately 300kcals and 9g protein)   NUTRITION DIAGNOSIS:   Malnutrition related to chronic illness as evidenced by mild depletion of body fat, moderate depletions of muscle mass.  GOAL:   Patient will meet greater than or equal to 90% of their needs  MONITOR:   PO intake, Supplement acceptance, Labs, Weight trends, I & O's  REASON FOR ASSESSMENT:   Malnutrition Screening Tool    ASSESSMENT:   Pt admitted with aspiration pna. Pt with recent admission to [facility] 2 weeks ago for the same. Pt with h/o head and neck cancer in the past as well as severe Parkinsons disease with dysphagia.  Pt had PEG placed in August 2016 and removed several months ago by Dr. Candace Cruise outpatient per pt on last admission.  Past Medical History  Diagnosis Date  . Barrett esophagus   . Hypertension   . Hyperlipidemia   . Multiple pulmonary nodules   . CHF (congestive heart failure) ([place])   . Lumbar spinal stenosis   . COPD (chronic obstructive pulmonary disease) ([place])   . CAD (coronary artery disease)     on 2l home oxygen  . On home oxygen therapy   . DVT (deep venous thrombosis) ([place]) 11/2013; 02/01/2015    on coumadin  . Parkinson's disease ([place])   . Recurrent aspiration pneumonia ([place])   . Sleep apnea   . Hypothyroidism   . Type II diabetes mellitus ([place])   . Iron deficiency anemia   . GERD (gastroesophageal reflux disease)   . Seizures ([place])   . DJD (degenerative joint disease)   . Arthritis   . Chronic lower back pain   . History of gout   . Depression   . Anxiety   . Basal cell carcinoma   . Throat cancer ([place])   . Heart attack ([place]) 03/20/1995  . 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 When asked pt reports he hasn't had a good appetite since August.   Pt had a PEG placed in August 2016 however pt had PEG removed a few months. Pt reports mashing up his foods and using thick-it and nectar thick liquids usually. Pt reports eating things like soft scrambled eggs, mashed up pastas. Pt reports I did have some cut up steak and 1.5days ago. When asked, pt reports he makes his own meals.  Pt reports drinking thickened Carnation Instant Breakfast and/or Ensure Plus at home 1-2 per day. Pt also reports drinking nectar thick Goya.   Medications: fentanyl, metformin, protonix, coumadin Labs: glucose 131    Gastrointestinal Profile: Last BM:  12/10/2015   Nutrition-Focused Physical Exam Findings: Nutrition-Focused physical exam completed. Findings are mild-moderate fat depletion, moderate muscle depletion, and mild edema.     Weight Change: Pt reports weight of 183lbs in August 2016 and it continues to gradually decrease. (13% weight loss in 10 months)   Skin:  Reviewed, no issues   Height:   Ht Readings from Last 1 Encounters:  12/11/15 5\' 10"  (1.778 m)    Weight:   Wt Readings from Last 1 Encounters:  12/11/15 159 lb (72.122 kg)   Wt Readings from Last 10 Encounters:  12/11/15 159 lb (72.122 kg)  11/29/15 167 lb (75.751 kg)  10/29/15 170 lb (77.111 kg)  03/06/15 184 lb (83.462 kg)  02/08/15 183 lb 10.3 oz (83.3 kg)    BMI:  Body mass index is 22.81 kg/(m^2).  Estimated Nutritional Needs:   Kcal:  1800-2160kcals  Protein:  86-108g protein  Fluid:  >/= 1.8L fluid  EDUCATION NEEDS:   Education needs no appropriate at this time  Dwyane Luo, RD, LDN Pager [phone] Weekend/On-Call Pager [phone]

## 2015-12-12 NOTE — Care Management Important Message (Signed)
Important Message  Patient Details  Name: John Mendoza MRN: [id number] Date of Birth: 05-21-1946   Medicare Important Message Given:  Yes    Shelbie Ammons, RN 12/12/2015, 9:00 AM

## 2015-12-12 NOTE — Progress Notes (Signed)
ANTICOAGULATION CONSULT NOTE -FOLLOW UP   Pharmacy Consult for Warfarin  Indication: DVT prophylaxis.   Allergies  Allergen Reactions  . Other Other (See Comments)    Dizziness, lightheadedness, Pt states that inhaled medications make him depressed and have negative thoughts.      Patient Measurements: Height: 5\' 10"  (177.8 cm) Weight: 159 lb (72.122 kg) IBW/kg (Calculated) : 73 Heparin Dosing Weight:   Vital Signs: Temp: 98.6 F (37 C) (06/07 0509) Temp Source: Oral (06/07 0509) BP: 111/55 mmHg (06/07 0509) Pulse Rate: 63 (06/07 0509)  Labs:  Recent Labs  12/11/15 1025 12/12/15 0644  HGB 10.7* 9.6*  HCT 33.6* 29.9*  PLT 207 186  LABPROT 23.7* 25.1*  INR 2.13 2.30  CREATININE 0.83  --     Estimated Creatinine Clearance: 84.5 mL/min (by C-G formula based on Cr of 0.83).   Medical History: Past Medical History  Diagnosis Date  . Barrett esophagus   . Hypertension   . Hyperlipidemia   . Multiple pulmonary nodules   . CHF (congestive heart failure) ([place])   . Lumbar spinal stenosis   . COPD (chronic obstructive pulmonary disease) ([place])   . CAD (coronary artery disease)     on 2l home oxygen  . On home oxygen therapy   . DVT (deep venous thrombosis) ([place]) 11/2013; 02/01/2015    on coumadin  . Parkinson's disease ([place])   . Recurrent aspiration pneumonia ([place])   . Sleep apnea   . Hypothyroidism   . Type II diabetes mellitus ([place])   . Iron deficiency anemia   . GERD (gastroesophageal reflux disease)   . Seizures ([place])   . DJD (degenerative joint disease)   . Arthritis   . Chronic lower back pain   . History of gout   . Depression   . Anxiety   . Basal cell carcinoma   . Throat cancer ([place])   . Heart attack ([place]) 03/20/1995  . 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 acetaminophen (TYLENOL) 500 MG tablet Take 500 mg  by mouth every 6 (six) hours as needed.   PRN at Unknown time  . ARIPiprazole (ABILIFY) 5 MG tablet Take 5 mg by mouth 2 (two) times daily.   12/11/2015 at 0800  . Artificial Tear Ointment (DRY EYES OP) Place 1 drop into both eyes daily as needed (dry eyes).   PRN at Unknown time  . aspirin EC 81 MG tablet Take 81 mg by mouth at bedtime.    12/10/2015 at 2300  . atorvastatin (LIPITOR) 40 MG tablet Take 40 mg by mouth at bedtime.   12/10/2015 at 2300  . bisacodyl (DULCOLAX) 10 MG suppository Place 1 suppository (10 mg total) rectally daily as needed for moderate constipation. 12 suppository 0 PRN at Unknown time  . bisoprolol (ZEBETA) 10 MG tablet Take 10 mg by mouth daily.   12/11/2015 at 0800  . carbidopa-levodopa (SINEMET IR) 25-250 MG tablet Take 1 tablet by mouth 3 (three) times daily.   12/11/2015 at 0800  . clonazePAM (KLONOPIN) 1 MG tablet Take 1 mg by mouth at bedtime.   12/10/2015 at 2300  . fentaNYL (DURAGESIC - DOSED MCG/HR) 50 MCG/HR Place 1 patch (50 mcg total) onto the skin every 3 (three) days. 10 patch 0 Past Week at Unknown time  . HYDROcodone-acetaminophen (NORCO/VICODIN) 5-325 MG tablet Take 1 tablet  by mouth every 8 (eight) hours as needed for moderate pain.   PRN at Unknown time  . lamoTRIgine (LAMICTAL) 25 MG tablet Take 25 mg by mouth 2 (two) times daily.   12/11/2015 at 0800  . levothyroxine (SYNTHROID, LEVOTHROID) 137 MCG tablet Take 137 mcg by mouth daily.   12/11/2015 at 0800  . losartan (COZAAR) 25 MG tablet Take 1 tablet (25 mg total) by mouth daily. 30 tablet 11 12/11/2015 at 0800  . losartan (COZAAR) 50 MG tablet Take 1 tablet by mouth daily.   12/11/2015 at 0800  . metFORMIN (GLUCOPHAGE) 500 MG tablet Take 500 mg by mouth 2 (two) times daily.   12/11/2015 at 0800  . nitroGLYCERIN (NITROSTAT) 0.4 MG SL tablet Place 0.4 mg under the tongue every 5 (five) minutes x 3 doses as needed for chest pain.    PRN at Unknown time  . omeprazole (PRILOSEC) 20 MG capsule Take 40 mg by mouth 2 (two) times  daily before a meal.   12/11/2015 at 0800  . PARoxetine (PAXIL) 20 MG tablet Take 60 mg by mouth daily.    12/11/2015 at 0800  . tamsulosin (FLOMAX) 0.4 MG CAPS Take 0.4 mg by mouth daily.    12/11/2015 at 0800  . tiZANidine (ZANAFLEX) 4 MG tablet Take 4 mg by mouth every 8 (eight) hours as needed for muscle spasms.   PRN at Unknown time  . triamcinolone cream (KENALOG) 0.5 % Apply 1 application topically 2 (two) times daily as needed.    PRN  . warfarin (COUMADIN) 5 MG tablet Take 1 tablet (5 mg total) by mouth daily. 30 tablet 11 12/11/2015 at 0800  . clindamycin (CLEOCIN) 300 MG capsule Take 1 capsule (300 mg total) by mouth every 8 (eight) hours. (Patient not taking: Reported on 12/11/2015) 15 capsule 0     Assessment: Pharmacy consulted to manage warfarin in this 70 year old male with hx of DVT.  Pt was on warfarin 5 mg PO daily at home. 6/6 :  INR = 2.13 6/7: INR: 2.30  Pt on Zosyn 3.375 gm IV Q8H EI  Goal of Therapy:  INR 2-3   Plan:  Will continue warfarin 5 mg PO q1800. PT/INR with am labs.   Deitrich Steve D 12/12/2015,9:58 AM

## 2015-12-13 LAB — PROTIME-INR
INR: 3.05
PROTHROMBIN TIME: 31 s — AB (ref 11.4–15.0)

## 2015-12-13 LAB — CBC
HCT: 28.7 % — ABNORMAL LOW (ref 40.0–52.0)
HEMOGLOBIN: 9.4 g/dL — AB (ref 13.0–18.0)
MCH: 27 pg (ref 26.0–34.0)
MCHC: 32.8 g/dL (ref 32.0–36.0)
MCV: 82.2 fL (ref 80.0–100.0)
PLATELETS: 182 10*3/uL (ref 150–440)
RBC: 3.5 MIL/uL — ABNORMAL LOW (ref 4.40–5.90)
RDW: 17.2 % — AB (ref 11.5–14.5)
WBC: 9.8 10*3/uL (ref 3.8–10.6)

## 2015-12-13 MED ORDER — AMOXICILLIN-POT CLAVULANATE 875-125 MG PO TABS
1.0000 | ORAL_TABLET | Freq: Two times a day (BID) | ORAL | Status: DC
  Administered 2015-12-13: 1 via ORAL
  Filled 2015-12-13: qty 1

## 2015-12-13 MED ORDER — AMOXICILLIN-POT CLAVULANATE 875-125 MG PO TABS: 1.0000 | ORAL_TABLET | Freq: Two times a day (BID) | ORAL | Status: DC

## 2015-12-13 NOTE — Discharge Instructions (Signed)
Dietary instruction NDDS2 with nectar thick liquids.  HHPT

## 2015-12-13 NOTE — Care Management (Signed)
Discharge to home today per Dr. Fritzi Mandes. Telephone call to Romie Minus (wife). States that they have had [facility] in the past and would like to have them again. Will update Floydene Flock, [employer] representative. Also, she will be transporting her husband home per private car. Will need some assistance getting him into the car and can get her brother's help getting him into the home. Will come to hospital after lunch and will bring her husband's oxygen tank Shelbie Ammons RN MSN [place] [facility] [phone]

## 2015-12-13 NOTE — Progress Notes (Signed)
OT Cancellation Note  Patient Details Name: John Mendoza MRN: [id number] DOB: 03/31/1946   Cancelled Treatment:    Reason Eval/Treat Not Completed: OT screened, no needs identified, will sign off.  Chart reviewed after receiving order. Met with patient who indicated his wife helps with ADLs as needed and does not need any further assistance even though he could benefit from services.  Screening only.  Please consult if status changes and any further needs are identified.  Thank you for the referral.  Chrys Racer, OTR/L ascom [phone] 12/13/2015, 1:14 PM

## 2015-12-13 NOTE — Progress Notes (Signed)
Patient discharged home with home health. All discharge instructions given and all questions answered. 

## 2015-12-13 NOTE — Evaluation (Signed)
Clinical/Bedside Swallow Evaluation Patient Details  Name: John Mendoza MRN: [id number] Date of Birth: [birth year]  Today's Date: 12/13/2015 Time: SLP Start Time (ACUTE ONLY): 23 SLP Stop Time (ACUTE ONLY): 1330 SLP Time Calculation (min) (ACUTE ONLY): 60 min  Past Medical History:  Past Medical History  Diagnosis Date  . Barrett esophagus   . Hypertension   . Hyperlipidemia   . Multiple pulmonary nodules   . CHF (congestive heart failure) ([place])   . Lumbar spinal stenosis   . COPD (chronic obstructive pulmonary disease) ([place])   . CAD (coronary artery disease)     on 2l home oxygen  . On home oxygen therapy   . DVT (deep venous thrombosis) ([place]) 11/2013; 02/01/2015    on coumadin  . Parkinson's disease ([place])   . Recurrent aspiration pneumonia ([place])   . Sleep apnea   . Hypothyroidism   . Type II diabetes mellitus ([place])   . Iron deficiency anemia   . GERD (gastroesophageal reflux disease)   . Seizures ([place])   . DJD (degenerative joint disease)   . Arthritis   . Chronic lower back pain   . History of gout   . Depression   . Anxiety   . Basal cell carcinoma   . Throat cancer ([place])   . Heart attack ([place]) 03/20/1995  . 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 Anterior cervical decomp/discectomy fusion  X 2  . Radical neck dissection Right ~ 1998    "throat cancer"  . Total knee arthroplasty Right 2011  . Thyroidectomy  ~ 1996 X 2  . Cataract extraction w/ intraocular lens  implant, bilateral Bilateral   . Tonsillectomy    . Excisional hemorrhoidectomy    . Joint replacement    . Back surgery    . Posterior fusion cervical spine  X 1  . Coronary angioplasty with stent placement    . Coronary artery bypass graft  1996    "CABG X3"  . Basal cell carcinoma excision     HPI:  Pt is a 70 y.o. male with a known history of Hypertension, Parkinson's  disease, history of throat cancer status post radiation treatment in the past with history of recurrent aspiration pneumonia in the past requiring a PEG tube placement which was removed after several months on swallow eval was done and patient was placed on honey thick liquids. It seems patient is not compliant at home with his diet and this is the second admission in 2 weeks for recurrent aspiration pneumonia. Pt has a history of Radical neck dissectionw/ Radiation txs and late effects of Dyspahgia(suspected), Goodpasture Syndrome(pulmonary), Barrett's Esophagus, GERD, Oropharyngeal Phase Dysphagia per MBSSs. Pt has been recommended to be on a Honey consistency liquids in the past but states he can tolerate Nectar consistency. Per report, pt is taking water but just to "hold in his mouth" and/or "wet his mouth". Pt has recently admitted w/ pneumonia suspected to be aspiration pneumonia. He is knowledgeable of his risk for aspiration, dysphagia, and need to use aspiration precautions when taking any po's.    Assessment / Plan / Recommendation Clinical Impression  Pt appears to adequately tolerate trials of Nectar consistency liquids via cup and mech soft/puree foods w/ no immediate, overt s/s of aspiration noted; pt consumed SINGLE ice chip trials w/ same presentation. No declinein respiratory status during po trials occured.  During oral phase, pt benefited from alternating foods/liquids to aid full mastication/clearing of min. soft solid bolus residue intermittently and taking his time to fully masticate boluses. No Esophageal phase deficits observed w/ few trials though pt has baseline of Barett's Esophagus and GERD. No trials of thin liquids were given sec. to pt's baseline Pharyngeal phase dysphagia - see results of MBSS 05/30/15. Pt continues to be at increased risk for aspiration of po's d/t baseline dxs of Dysphagia, Parkinson's Dis., and late effects of the throat Ca w/ Radiation tx as well as  Esophageal dxs. Recommend pt continue a Dysphagia level 2 w/ Nectar liquids w/ strict aspiration and Reflux precautions. Assitance at meals; Meds in Puree - Crushed when able or in liquid form mixed in a Puree. ST will f/u w/ pt's status while admitted for any education indicated. Pt appears at his baseline.     Aspiration Risk  Moderate aspiration risk    Diet Recommendation  Dysphagia 2 w/ Nectar liquids; strict aspiration and Reflux precautions.   Medication Administration: Crushed with puree    Other  Recommendations Recommended Consults:  (dietician) Oral Care Recommendations: Oral care BID;Staff/trained caregiver to provide oral care Other Recommendations: Order thickener from pharmacy;Prohibited food (jello, ice cream, thin soups);Remove water pitcher;Have oral suction available   Follow up Recommendations  None (TBD)    Frequency and Duration min 2x/week  1 week       Prognosis Prognosis for Safe Diet Advancement: Fair Barriers to Reach Goals: Severity of deficits;Time post onset      Swallow Study   General Date of Onset: 12/11/15 HPI: Pt is a 70 y.o. male with a known history of Hypertension, Parkinson's disease, history of throat cancer status post radiation treatment in the past with history of recurrent aspiration pneumonia in the past requiring a PEG tube placement which was removed after several months on swallow eval was done and patient was placed on honey thick liquids. It seems patient is not compliant at home with his diet and this is the second admission in 2 weeks for recurrent aspiration pneumonia. Pt has a history of Radical neck dissectionw/ Radiation txs and late effects of Dyspahgia(suspected), Goodpasture Syndrome(pulmonary), Barrett's Esophagus, GERD, Oropharyngeal Phase Dysphagia per MBSSs. Pt has been recommended to be on a Honey consistency liquids in the past but states he can tolerate Nectar consistency. Per report, pt is taking water but just to "hold in  his mouth" and/or "wet his mouth". Pt has recently admitted w/ pneumonia suspected to be aspiration pneumonia. He is knowledgeable of his risk for aspiration, dysphagia, and need to use aspiration precautions when taking any po's.  Type of Study: Bedside Swallow Evaluation Previous Swallow Assessment: 05/30/15: This 70 year old man with complicated medical history including throat cancer with radiation treatment in the past; is presenting with moderate oropharyngeal dysphagia characterized by lingual tremoring, delayed pharyngeal swallow initiation (triggers while falling from the valleculae to the pyriform sinuses), reduced tongue base retraction, reduced hyolaryngeal movement, and moderate pharyngeal residue post swallow . There was no observed laryngeal penetration/aspiration of solids. There was laryngeal penetration with laryngeal vestibule residue (no aspiration) with honey-thick and nectar-thick liquid. A throat clear maneuver and swallow is effective in clearing penetrated material and aids pharyngeal clearance as well. There was aspiration of thin liquid as the patient could not generate the throat clear fast enough to prevent the penetrated material from entering the airway. This study is worse than the prior study at this facility (01/17/2012) but much  better than the study done at [facility] (02/03/2015) while he was very ill and debilitated. This study supports a soft/moist diet with nectar-thick liquids and strict adherence to throat clear and swallow maneuver every 1 to 3 swallows. The patient has swallowing exercises and I have given him 2 more. He is reminded to maintain strict and stringent oral care. It was reviewed w/ pt the probability that his swallow would worsen due to ongoing late effects of radiation treatment; exercises given could at best maintain his current swallow function. Diet Prior to this Study: Dysphagia 3 (soft);Nectar-thick liquids Temperature Spikes Noted: No (wbc  12.2 at admission; currently 9.8) Respiratory Status: Nasal cannula (4 liters) History of Recent Intubation: No Behavior/Cognition: Alert;Cooperative;Pleasant mood Oral Cavity Assessment: Within Functional Limits Oral Care Completed by SLP: Yes Oral Cavity - Dentition: Missing dentition Vision: Functional for self-feeding (needs assist) Self-Feeding Abilities: Able to feed self;Needs assist;Needs set up Patient Positioning: Upright in bed Baseline Vocal Quality: Normal Volitional Cough: Strong;Congested Volitional Swallow: Able to elicit    Oral/Motor/Sensory Function Overall Oral Motor/Sensory Function: Mild impairment   Ice Chips Ice chips: Within functional limits Presentation: Spoon (3 trials - one at a time)   Thin Liquid Thin Liquid: Not tested    Nectar Thick Nectar Thick Liquid: Within functional limits Presentation: Cup;Self Fed Other Comments: 6 trials   Honey Thick Honey Thick Liquid: Not tested   Puree Puree: Within functional limits Presentation: Spoon (fed; 3 trials)   Solid   GO   Solid: Impaired Presentation: Spoon Oral Phase Impairments: Impaired mastication Oral Phase Functional Implications: Impaired mastication;Oral residue Pharyngeal Phase Impairments:  (none) Other Comments: alternating foods/liquids appeared to aid clearing; time to fully masticate increased textures; multiple swallowing to fully clear       Orinda Kenner, MS, CCC-SLP  Watson,Katherine 12/13/2015,1:58 PM

## 2015-12-13 NOTE — Discharge Summary (Signed)
[facility] NAME: John Mendoza    MR#:  [id number]  DATE OF BIRTH:  1945/12/25  DATE OF ADMISSION:  12/11/2015 ADMITTING PHYSICIAN: Fritzi Mandes, MD  DATE OF DISCHARGE: 12/13/15  PRIMARY CARE PHYSICIAN: Tama High III, MD    ADMISSION DIAGNOSIS:  Hypoxia [R09.02] Right upper lobe pneumonia [J18.9]  DISCHARGE DIAGNOSIS:  Recurrent aspiration pneumonia H/o Throat cancer  SECONDARY DIAGNOSIS:   Past Medical History  Diagnosis Date  . Barrett esophagus   . Hypertension   . Hyperlipidemia   . Multiple pulmonary nodules   . CHF (congestive heart failure) ([place])   . Lumbar spinal stenosis   . COPD (chronic obstructive pulmonary disease) ([place])   . CAD (coronary artery disease)     on 2l home oxygen  . On home oxygen therapy   . DVT (deep venous thrombosis) ([place]) 11/2013; 02/01/2015    on coumadin  . Parkinson's disease ([place])   . Recurrent aspiration pneumonia ([place])   . Sleep apnea   . Hypothyroidism   . Type II diabetes mellitus ([place])   . Iron deficiency anemia   . GERD (gastroesophageal reflux disease)   . Seizures ([place])   . DJD (degenerative joint disease)   . Arthritis   . Chronic lower back pain   . History of gout   . Depression   . Anxiety   . Basal cell carcinoma   . Throat cancer ([place])   . Heart attack ([place]) 03/20/1995  . 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 male with a known history of Hypertension, Parkinson's disease, history of throat cancer status post radiation treatment in the past with history of recurrent aspiration pneumonia in the past requiring a PEG tube placement which was removed after several months on swallow eval was done and patient was placed on honey thick liquids. It seems patient is not compliant at home with his diet and this is the second admission in 2 weeks for recurrent aspiration  pneumonia.  1. Acute on chronic hypoxic respiratory failure secondary to recurrent aspiration pneumonia -Patient was just discharged on 31st May with above symptoms -recieved IV Zosyn---unasyn--augmentin - speech evaluation noted. Recommends to continue dysphagia 1 with nectar thick liquid diet. -Shortness of breath has improved.  2. History of throat cancer with radiation in the past and history of recurrent epiglottitis requiring PEG tube placement - if continues to aspirate then ?? May need PEG tube placed back again. Will monitor.  -Dr [name] aware of pt's admission  3. Parkinson's disease -Continue Sinemet  4. Hypertension- stable. -On losartan  5. Hypothyroidism continue Synthroid  6. Hyperlipidemia continue Lipitor  7. Generalized weakness/deconditioning -PT recommends HHPT   8. Chronic pain - cont. Fentanyl Patch.   9. BPH - cont. Flomax.   10. Anxiety - cont. Paxil.   11. Hx of previous DVT - cont. Coumadin.   D/c home later today with HHPt CONSULTS OBTAINED:     DRUG ALLERGIES:   Allergies  Allergen Reactions  . Other Other (See Comments)    Dizziness, lightheadedness, Pt states that inhaled medications make him depressed and have negative thoughts.      DISCHARGE MEDICATIONS:   Current Discharge Medication List    START taking these medications   Details  amoxicillin-clavulanate (AUGMENTIN) 875-125 MG tablet Take 1 tablet by mouth every 12 (twelve)  hours. Qty: 16 tablet, Refills: 0      CONTINUE these medications which have NOT CHANGED   Details  acetaminophen (TYLENOL) 500 MG tablet Take 500 mg by mouth every 6 (six) hours as needed.    ARIPiprazole (ABILIFY) 5 MG tablet Take 5 mg by mouth 2 (two) times daily.    Artificial Tear Ointment (DRY EYES OP) Place 1 drop into both eyes daily as needed (dry eyes).    aspirin EC 81 MG tablet Take 81 mg by mouth at bedtime.     atorvastatin (LIPITOR) 40 MG tablet Take 40 mg by mouth at bedtime.     bisacodyl (DULCOLAX) 10 MG suppository Place 1 suppository (10 mg total) rectally daily as needed for moderate constipation. Qty: 12 suppository, Refills: 0    bisoprolol (ZEBETA) 10 MG tablet Take 10 mg by mouth daily.    carbidopa-levodopa (SINEMET IR) 25-250 MG tablet Take 1 tablet by mouth 3 (three) times daily.    clonazePAM (KLONOPIN) 1 MG tablet Take 1 mg by mouth at bedtime.    fentaNYL (DURAGESIC - DOSED MCG/HR) 50 MCG/HR Place 1 patch (50 mcg total) onto the skin every 3 (three) days. Qty: 10 patch, Refills: 0    HYDROcodone-acetaminophen (NORCO/VICODIN) 5-325 MG tablet Take 1 tablet by mouth every 8 (eight) hours as needed for moderate pain.    lamoTRIgine (LAMICTAL) 25 MG tablet Take 25 mg by mouth 2 (two) times daily.    levothyroxine (SYNTHROID, LEVOTHROID) 137 MCG tablet Take 137 mcg by mouth daily.    losartan (COZAAR) 50 MG tablet Take 1 tablet by mouth daily.    metFORMIN (GLUCOPHAGE) 500 MG tablet Take 500 mg by mouth 2 (two) times daily.    nitroGLYCERIN (NITROSTAT) 0.4 MG SL tablet Place 0.4 mg under the tongue every 5 (five) minutes x 3 doses as needed for chest pain.     omeprazole (PRILOSEC) 20 MG capsule Take 40 mg by mouth 2 (two) times daily before a meal.    PARoxetine (PAXIL) 20 MG tablet Take 60 mg by mouth daily.     tamsulosin (FLOMAX) 0.4 MG CAPS Take 0.4 mg by mouth daily.     tiZANidine (ZANAFLEX) 4 MG tablet Take 4 mg by mouth every 8 (eight) hours as needed for muscle spasms.    triamcinolone cream (KENALOG) 0.5 % Apply 1 application topically 2 (two) times daily as needed.     warfarin (COUMADIN) 5 MG tablet Take 1 tablet (5 mg total) by mouth daily. Qty: 30 tablet, Refills: 11      STOP taking these medications     clindamycin (CLEOCIN) 300 MG capsule         If you experience worsening of your admission symptoms, develop shortness of breath, life threatening emergency, suicidal or homicidal thoughts you must seek medical attention  immediately by calling 911 or calling your MD immediately  if symptoms less severe.  You Must read complete instructions/literature along with all the possible adverse reactions/side effects for all the Medicines you take and that have been prescribed to you. Take any new Medicines after you have completely understood and accept all the possible adverse reactions/side effects.   Please note  You were cared for by a hospitalist during your hospital stay. If you have any questions about your discharge medications or the care you received while you were in the hospital after you are discharged, you can call the unit and asked to speak with the hospitalist on call if the hospitalist  that took care of you is not available. Once you are discharged, your primary care physician will handle any further medical issues. Please note that NO REFILLS for any discharge medications will be authorized once you are discharged, as it is imperative that you return to your primary care physician (or establish a relationship with a primary care physician if you do not have one) for your aftercare needs so that they can reassess your need for medications and monitor your lab values. Today   SUBJECTIVE   Doing well  VITAL SIGNS:  Blood pressure 115/67, pulse 68, temperature 97.9 F (36.6 C), temperature source Oral, resp. rate 17, height 5\' 10"  (1.778 m), weight 72.122 kg (159 lb), SpO2 98 %.  I/O:    Intake/Output Summary (Last 24 hours) at 12/13/15 0634 Last data filed at 12/13/15 0444  Gross per 24 hour  Intake    360 ml  Output    625 ml  Net   -265 ml    PHYSICAL EXAMINATION:  GENERAL:  70 y.o.-year-old patient lying in the bed with no acute distress.  EYES: Pupils equal, round, reactive to light and accommodation. No scleral icterus. Extraocular muscles intact.  HEENT: Head atraumatic, normocephalic. Oropharynx and nasopharynx clear.  NECK:  Supple, no jugular venous distention. No thyroid enlargement,  no tenderness.  LUNGS: Normal breath sounds bilaterally, no wheezing, rales,rhonchi or crepitation. No use of accessory muscles of respiration.  CARDIOVASCULAR: S1, S2 normal. No murmurs, rubs, or gallops.  ABDOMEN: Soft, non-tender, non-distended. Bowel sounds present. No organomegaly or mass.  EXTREMITIES: No pedal edema, cyanosis, or clubbing.  NEUROLOGIC: Cranial nerves II through XII are intact. Muscle strength 5/5 in all extremities. Sensation intact. Gait not checked.  PSYCHIATRIC: The patient is alert and oriented x 3.  SKIN: No obvious rash, lesion, or ulcer.   DATA REVIEW:   CBC   Recent Labs Lab 12/13/15 0435  WBC 9.8  HGB 9.4*  HCT 28.7*  PLT 182    Chemistries   Recent Labs Lab 12/11/15 1025  NA 138  K 4.1  CL 103  CO2 29  GLUCOSE 131*  BUN 22*  CREATININE 0.83  CALCIUM 8.6*  AST 17  ALT 12*  ALKPHOS 76  BILITOT 0.4    Microbiology Results   Recent Results (from the past 240 hour(s))  Urine culture     Status: None   Collection Time: 12/11/15 10:25 AM  Result Value Ref Range Status   Specimen Description URINE, RANDOM  Final   Special Requests NONE  Final   Culture NO GROWTH Performed at [facility]   Final   Report Status 12/12/2015 FINAL  Final    RADIOLOGY:  Dg Chest 1 View  12/11/2015  CLINICAL DATA:  70 year old male with fall, shortness of breath and rectal bleeding today. EXAM: CHEST 1 VIEW COMPARISON:  11/09/2015 and prior exams FINDINGS: Cardiomegaly CABG changes identified. New right upper lobe airspace disease noted. There is no evidence of pneumothorax, pleural effusion or acute bony abnormality. IMPRESSION: New right upper lobe airspace disease -question pneumonia or aspiration. This may represent hemorrhage with history of fall, but no acute bony abnormality is identified. Cardiomegaly. Electronically Signed   By: Margarette Canada M.D.   On: 12/11/2015 10:45     Management plans discussed with the patient, family and they are  in agreement.  CODE STATUS:     Code Status Orders        Start     Ordered  12/11/15 1709  Full code   Continuous     12/11/15 1708    Code Status History    Date Active Date Inactive Code Status Order ID Comments User Context   11/29/2015  9:13 PM 12/01/2015  1:58 PM Full Code [id number]  Gladstone Lighter, MD Inpatient   03/06/2015 10:40 AM 03/08/2015  2:22 PM Full Code [id number]  Adin Hector, MD Inpatient   02/01/2015  5:36 PM 02/08/2015  6:46 PM Full Code [id number]  Samella Parr, NP Inpatient   01/26/2015 10:05 PM 02/01/2015  5:36 PM Full Code [id number]  Demetrios Loll, MD Inpatient      TOTAL TIME TAKING CARE OF THIS PATIENT: 40 minutes.    John Mendoza M.D on 12/13/2015 at 6:34 AM  Between 7am to 6pm - Pager - [phone] After 6pm go to [email or web address] - password [email or web address] [facility]  [facility]  Office  [phone]  CC: Primary care physician; Adin Hector, MD

## 2015-12-13 NOTE — Clinical Social Work Note (Signed)
CSW consulted for discharge planning. CSW reviewed chart. Pt is ready for discharge today. PT is recommending HHPT. RNCM is following for discharge planning needs. CSW is signing off as no further needs identified.   Darden Dates, MSW, LCSW Clinical Social Worker  [phone]

## 2015-12-19 LAB — CULTURE, BLOOD (ROUTINE X 2)
CULTURE: NO GROWTH
Culture: NO GROWTH

## 2015-12-27 ENCOUNTER — Other Ambulatory Visit: Payer: Self-pay | Admitting: Internal Medicine

## 2015-12-27 DIAGNOSIS — M545 Low back pain: Secondary | ICD-10-CM

## 2016-01-02 ENCOUNTER — Ambulatory Visit
Admission: RE | Admit: 2016-01-02 | Discharge: 2016-01-02 | Disposition: A | Discharge status: Home / Self Care | Payer: Medicare Other | Source: Ambulatory Visit | Attending: Internal Medicine | Admitting: Internal Medicine

## 2016-01-02 DIAGNOSIS — A4189 Other specified sepsis: Secondary | ICD-10-CM | POA: Diagnosis not present

## 2016-01-02 DIAGNOSIS — M4806 Spinal stenosis, lumbar region: Secondary | ICD-10-CM | POA: Insufficient documentation

## 2016-01-02 DIAGNOSIS — M545 Low back pain: Secondary | ICD-10-CM

## 2016-01-02 DIAGNOSIS — J690 Pneumonitis due to inhalation of food and vomit: Secondary | ICD-10-CM | POA: Diagnosis not present

## 2016-01-04 ENCOUNTER — Emergency Department: Payer: Medicare Other

## 2016-01-04 ENCOUNTER — Inpatient Hospital Stay
Admission: EM | Admit: 2016-01-04 | Discharge: 2016-01-11 | DRG: 871 | Disposition: A | Discharge status: Home / Self Care | Payer: Medicare Other | Attending: Internal Medicine | Admitting: Internal Medicine

## 2016-01-04 DIAGNOSIS — G4733 Obstructive sleep apnea (adult) (pediatric): Secondary | ICD-10-CM | POA: Diagnosis present

## 2016-01-04 DIAGNOSIS — R6520 Severe sepsis without septic shock: Secondary | ICD-10-CM | POA: Diagnosis present

## 2016-01-04 DIAGNOSIS — I252 Old myocardial infarction: Secondary | ICD-10-CM

## 2016-01-04 DIAGNOSIS — A419 Sepsis, unspecified organism: Secondary | ICD-10-CM | POA: Diagnosis present

## 2016-01-04 DIAGNOSIS — Z85819 Personal history of malignant neoplasm of unspecified site of lip, oral cavity, and pharynx: Secondary | ICD-10-CM | POA: Diagnosis not present

## 2016-01-04 DIAGNOSIS — K2270 Barrett's esophagus without dysplasia: Secondary | ICD-10-CM | POA: Diagnosis present

## 2016-01-04 DIAGNOSIS — K30 Functional dyspepsia: Secondary | ICD-10-CM | POA: Diagnosis present

## 2016-01-04 DIAGNOSIS — Z86718 Personal history of other venous thrombosis and embolism: Secondary | ICD-10-CM

## 2016-01-04 DIAGNOSIS — A4189 Other specified sepsis: Secondary | ICD-10-CM | POA: Diagnosis present

## 2016-01-04 DIAGNOSIS — Z955 Presence of coronary angioplasty implant and graft: Secondary | ICD-10-CM | POA: Diagnosis not present

## 2016-01-04 DIAGNOSIS — Z9981 Dependence on supplemental oxygen: Secondary | ICD-10-CM | POA: Diagnosis not present

## 2016-01-04 DIAGNOSIS — Z951 Presence of aortocoronary bypass graft: Secondary | ICD-10-CM | POA: Diagnosis not present

## 2016-01-04 DIAGNOSIS — R0603 Acute respiratory distress: Secondary | ICD-10-CM

## 2016-01-04 DIAGNOSIS — M1990 Unspecified osteoarthritis, unspecified site: Secondary | ICD-10-CM | POA: Diagnosis present

## 2016-01-04 DIAGNOSIS — E039 Hypothyroidism, unspecified: Secondary | ICD-10-CM | POA: Diagnosis present

## 2016-01-04 DIAGNOSIS — J9601 Acute respiratory failure with hypoxia: Secondary | ICD-10-CM | POA: Diagnosis not present

## 2016-01-04 DIAGNOSIS — Z7901 Long term (current) use of anticoagulants: Secondary | ICD-10-CM | POA: Diagnosis not present

## 2016-01-04 DIAGNOSIS — D509 Iron deficiency anemia, unspecified: Secondary | ICD-10-CM | POA: Diagnosis present

## 2016-01-04 DIAGNOSIS — Z96651 Presence of right artificial knee joint: Secondary | ICD-10-CM | POA: Diagnosis present

## 2016-01-04 DIAGNOSIS — J690 Pneumonitis due to inhalation of food and vomit: Secondary | ICD-10-CM | POA: Diagnosis present

## 2016-01-04 DIAGNOSIS — G40909 Epilepsy, unspecified, not intractable, without status epilepticus: Secondary | ICD-10-CM | POA: Diagnosis present

## 2016-01-04 DIAGNOSIS — R0600 Dyspnea, unspecified: Secondary | ICD-10-CM

## 2016-01-04 DIAGNOSIS — I10 Essential (primary) hypertension: Secondary | ICD-10-CM | POA: Diagnosis present

## 2016-01-04 DIAGNOSIS — G20 Parkinson's disease: Secondary | ICD-10-CM | POA: Diagnosis present

## 2016-01-04 DIAGNOSIS — I5022 Chronic systolic (congestive) heart failure: Secondary | ICD-10-CM | POA: Diagnosis present

## 2016-01-04 DIAGNOSIS — R1310 Dysphagia, unspecified: Secondary | ICD-10-CM | POA: Diagnosis not present

## 2016-01-04 DIAGNOSIS — M545 Low back pain: Secondary | ICD-10-CM | POA: Diagnosis present

## 2016-01-04 DIAGNOSIS — I471 Supraventricular tachycardia: Secondary | ICD-10-CM | POA: Diagnosis not present

## 2016-01-04 DIAGNOSIS — Z923 Personal history of irradiation: Secondary | ICD-10-CM | POA: Diagnosis not present

## 2016-01-04 DIAGNOSIS — Z85828 Personal history of other malignant neoplasm of skin: Secondary | ICD-10-CM

## 2016-01-04 DIAGNOSIS — Z7982 Long term (current) use of aspirin: Secondary | ICD-10-CM

## 2016-01-04 DIAGNOSIS — I4892 Unspecified atrial flutter: Secondary | ICD-10-CM | POA: Diagnosis not present

## 2016-01-04 DIAGNOSIS — K219 Gastro-esophageal reflux disease without esophagitis: Secondary | ICD-10-CM | POA: Diagnosis present

## 2016-01-04 DIAGNOSIS — J441 Chronic obstructive pulmonary disease with (acute) exacerbation: Secondary | ICD-10-CM | POA: Diagnosis present

## 2016-01-04 DIAGNOSIS — I110 Hypertensive heart disease with heart failure: Secondary | ICD-10-CM | POA: Diagnosis present

## 2016-01-04 DIAGNOSIS — A401 Sepsis due to streptococcus, group B: Secondary | ICD-10-CM | POA: Diagnosis not present

## 2016-01-04 DIAGNOSIS — Z87891 Personal history of nicotine dependence: Secondary | ICD-10-CM | POA: Diagnosis not present

## 2016-01-04 DIAGNOSIS — I2510 Atherosclerotic heart disease of native coronary artery without angina pectoris: Secondary | ICD-10-CM | POA: Diagnosis present

## 2016-01-04 DIAGNOSIS — E785 Hyperlipidemia, unspecified: Secondary | ICD-10-CM | POA: Diagnosis present

## 2016-01-04 DIAGNOSIS — G8929 Other chronic pain: Secondary | ICD-10-CM | POA: Diagnosis present

## 2016-01-04 DIAGNOSIS — J9621 Acute and chronic respiratory failure with hypoxia: Secondary | ICD-10-CM | POA: Diagnosis not present

## 2016-01-04 DIAGNOSIS — Z79899 Other long term (current) drug therapy: Secondary | ICD-10-CM | POA: Diagnosis not present

## 2016-01-04 DIAGNOSIS — Z7984 Long term (current) use of oral hypoglycemic drugs: Secondary | ICD-10-CM

## 2016-01-04 LAB — URINALYSIS COMPLETE WITH MICROSCOPIC (ARMC ONLY)
Bacteria, UA: NONE SEEN
Bilirubin Urine: NEGATIVE
Glucose, UA: NEGATIVE mg/dL
HGB URINE DIPSTICK: NEGATIVE
Nitrite: NEGATIVE
PH: 5 (ref 5.0–8.0)
PROTEIN: NEGATIVE mg/dL
SQUAMOUS EPITHELIAL / LPF: NONE SEEN
Specific Gravity, Urine: 1.021 (ref 1.005–1.030)

## 2016-01-04 LAB — CBC WITH DIFFERENTIAL/PLATELET
BASOS ABS: 0 10*3/uL (ref 0–0.1)
BASOS PCT: 0 %
Eosinophils Absolute: 0 10*3/uL (ref 0–0.7)
Eosinophils Relative: 0 %
HEMATOCRIT: 35.3 % — AB (ref 40.0–52.0)
HEMOGLOBIN: 11.6 g/dL — AB (ref 13.0–18.0)
Lymphocytes Relative: 1 %
Lymphs Abs: 0.1 10*3/uL — ABNORMAL LOW (ref 1.0–3.6)
MCH: 26.6 pg (ref 26.0–34.0)
MCHC: 32.9 g/dL (ref 32.0–36.0)
MCV: 80.9 fL (ref 80.0–100.0)
MONOS PCT: 4 %
Monocytes Absolute: 0.2 10*3/uL (ref 0.2–1.0)
NEUTROS ABS: 5.7 10*3/uL (ref 1.4–6.5)
NEUTROS PCT: 95 %
Platelets: 196 10*3/uL (ref 150–440)
RBC: 4.36 MIL/uL — AB (ref 4.40–5.90)
RDW: 17.6 % — ABNORMAL HIGH (ref 11.5–14.5)
WBC: 6 10*3/uL (ref 3.8–10.6)

## 2016-01-04 LAB — COMPREHENSIVE METABOLIC PANEL
ALBUMIN: 3.7 g/dL (ref 3.5–5.0)
ALK PHOS: 69 U/L (ref 38–126)
ALT: 10 U/L — AB (ref 17–63)
ANION GAP: 9 (ref 5–15)
AST: 18 U/L (ref 15–41)
BUN: 22 mg/dL — ABNORMAL HIGH (ref 6–20)
CO2: 30 mmol/L (ref 22–32)
Calcium: 9.2 mg/dL (ref 8.9–10.3)
Chloride: 95 mmol/L — ABNORMAL LOW (ref 101–111)
Creatinine, Ser: 0.91 mg/dL (ref 0.61–1.24)
GFR calc Af Amer: >60 mL/min (ref >60)
GFR calc non Af Amer: >60 mL/min (ref >60)
GLUCOSE: 160 mg/dL — AB (ref 65–99)
Potassium: 4 mmol/L (ref 3.5–5.1)
SODIUM: 134 mmol/L — AB (ref 135–145)
TOTAL PROTEIN: 7 g/dL (ref 6.5–8.1)
Total Bilirubin: 0.7 mg/dL (ref 0.3–1.2)

## 2016-01-04 LAB — LACTIC ACID, PLASMA: Lactic Acid, Venous: 2.3 mmol/L (ref 0.5–1.9)

## 2016-01-04 LAB — TROPONIN I: TROPONIN I: 0.04 ng/mL — AB (ref <0.03)

## 2016-01-04 MED ORDER — SODIUM CHLORIDE 0.9 % IV SOLN: INTRAVENOUS | Status: AC

## 2016-01-04 MED ORDER — ACETAMINOPHEN 40 MG HALF SUPP
455.0000 mg | Freq: Once | RECTAL | Status: DC
  Filled 2016-01-04: qty 1

## 2016-01-04 MED ORDER — LEVOTHYROXINE SODIUM 25 MCG PO TABS
137.0000 ug | ORAL_TABLET | Freq: Every day | ORAL | Status: DC
  Administered 2016-01-05 – 2016-01-06 (×2): 137 ug via ORAL
  Filled 2016-01-04 (×4): qty 1

## 2016-01-04 MED ORDER — FENTANYL 50 MCG/HR TD PT72
50.0000 ug | MEDICATED_PATCH | TRANSDERMAL | Status: DC
  Administered 2016-01-05 – 2016-01-11 (×3): 50 ug via TRANSDERMAL
  Filled 2016-01-04 (×4): qty 1

## 2016-01-04 MED ORDER — ACETAMINOPHEN 325 MG RE SUPP
445.0000 mg | Freq: Once | RECTAL | Status: AC
  Administered 2016-01-04: 445 mg via RECTAL

## 2016-01-04 MED ORDER — SODIUM CHLORIDE 0.9 % IV BOLUS (SEPSIS)
1000.0000 mL | Freq: Once | INTRAVENOUS | Status: AC
Start: 2016-01-04 — End: 2016-01-04
  Administered 2016-01-04: 1000 mL via INTRAVENOUS

## 2016-01-04 MED ORDER — VANCOMYCIN HCL IN DEXTROSE 1-5 GM/200ML-% IV SOLN
1000.0000 mg | Freq: Once | INTRAVENOUS | Status: AC
  Administered 2016-01-04: 1000 mg via INTRAVENOUS
  Filled 2016-01-04: qty 200

## 2016-01-04 MED ORDER — MORPHINE SULFATE (PF) 2 MG/ML IV SOLN
2.0000 mg | Freq: Once | INTRAVENOUS | Status: AC
  Administered 2016-01-04: 2 mg via INTRAVENOUS

## 2016-01-04 MED ORDER — LAMOTRIGINE 25 MG PO TABS
25.0000 mg | ORAL_TABLET | Freq: Two times a day (BID) | ORAL | Status: DC
  Administered 2016-01-05 – 2016-01-11 (×12): 25 mg via ORAL
  Filled 2016-01-04 (×13): qty 1

## 2016-01-04 MED ORDER — PANTOPRAZOLE SODIUM 40 MG PO TBEC
40.0000 mg | DELAYED_RELEASE_TABLET | Freq: Two times a day (BID) | ORAL | Status: DC
  Administered 2016-01-05 – 2016-01-10 (×11): 40 mg via ORAL
  Filled 2016-01-04 (×12): qty 1

## 2016-01-04 MED ORDER — TAMSULOSIN HCL 0.4 MG PO CAPS
0.4000 mg | ORAL_CAPSULE | Freq: Every day | ORAL | Status: DC
  Administered 2016-01-05 – 2016-01-11 (×6): 0.4 mg via ORAL
  Filled 2016-01-04 (×6): qty 1

## 2016-01-04 MED ORDER — SODIUM CHLORIDE 0.9 % IV BOLUS (SEPSIS)
1000.0000 mL | Freq: Once | INTRAVENOUS | Status: AC
  Administered 2016-01-04: 1000 mL via INTRAVENOUS

## 2016-01-04 MED ORDER — CARBIDOPA-LEVODOPA 25-250 MG PO TABS
1.0000 | ORAL_TABLET | Freq: Three times a day (TID) | ORAL | Status: DC
  Administered 2016-01-05 – 2016-01-07 (×7): 1 via ORAL
  Filled 2016-01-04 (×11): qty 1

## 2016-01-04 MED ORDER — ATORVASTATIN CALCIUM 20 MG PO TABS
40.0000 mg | ORAL_TABLET | Freq: Every day | ORAL | Status: DC
  Administered 2016-01-05 – 2016-01-10 (×6): 40 mg via ORAL
  Filled 2016-01-04 (×6): qty 2

## 2016-01-04 MED ORDER — IPRATROPIUM-ALBUTEROL 0.5-2.5 (3) MG/3ML IN SOLN: 3.0000 mL | RESPIRATORY_TRACT | Status: DC | PRN

## 2016-01-04 MED ORDER — MORPHINE SULFATE (PF) 2 MG/ML IV SOLN: 2.0000 mg | INTRAVENOUS | Status: DC | PRN

## 2016-01-04 MED ORDER — ACETAMINOPHEN 325 MG RE SUPP
RECTAL | Status: AC
  Filled 2016-01-04: qty 1

## 2016-01-04 MED ORDER — PAROXETINE HCL 20 MG PO TABS
60.0000 mg | ORAL_TABLET | Freq: Every day | ORAL | Status: DC
  Administered 2016-01-05 – 2016-01-11 (×6): 60 mg via ORAL
  Filled 2016-01-04 (×6): qty 3

## 2016-01-04 MED ORDER — MORPHINE SULFATE (PF) 2 MG/ML IV SOLN
INTRAVENOUS | Status: AC
  Filled 2016-01-04: qty 1

## 2016-01-04 MED ORDER — ACETAMINOPHEN 325 MG PO TABS
650.0000 mg | ORAL_TABLET | Freq: Four times a day (QID) | ORAL | Status: DC | PRN
  Administered 2016-01-05: 650 mg via ORAL
  Filled 2016-01-04: qty 2

## 2016-01-04 MED ORDER — ACETAMINOPHEN 120 MG RE SUPP
RECTAL | Status: AC
  Filled 2016-01-04: qty 1

## 2016-01-04 MED ORDER — PIPERACILLIN-TAZOBACTAM 3.375 G IVPB 30 MIN
3.3750 g | Freq: Once | INTRAVENOUS | Status: AC
  Administered 2016-01-04: 3.375 g via INTRAVENOUS
  Filled 2016-01-04: qty 50

## 2016-01-04 MED ORDER — SODIUM CHLORIDE 0.9 % IV BOLUS (SEPSIS)
250.0000 mL | Freq: Once | INTRAVENOUS | Status: AC
Start: 2016-01-04 — End: 2016-01-04
  Administered 2016-01-04: 250 mL via INTRAVENOUS

## 2016-01-04 MED ORDER — VANCOMYCIN HCL IN DEXTROSE 1-5 GM/200ML-% IV SOLN
1000.0000 mg | Freq: Two times a day (BID) | INTRAVENOUS | Status: DC
  Filled 2016-01-04 (×2): qty 200

## 2016-01-04 MED ORDER — PIPERACILLIN-TAZOBACTAM 3.375 G IVPB
3.3750 g | Freq: Three times a day (TID) | INTRAVENOUS | Status: DC
  Filled 2016-01-04 (×3): qty 50

## 2016-01-04 MED ORDER — ACETAMINOPHEN 650 MG RE SUPP: 650.0000 mg | Freq: Four times a day (QID) | RECTAL | Status: DC | PRN

## 2016-01-04 MED ORDER — ACETAMINOPHEN 80 MG RE SUPP: 500.0000 mg | Freq: Once | RECTAL | Status: DC

## 2016-01-04 MED ORDER — ACETAMINOPHEN 650 MG RE SUPP
RECTAL | Status: AC
  Filled 2016-01-04: qty 1

## 2016-01-04 MED ORDER — SODIUM CHLORIDE 0.9% FLUSH
3.0000 mL | Freq: Two times a day (BID) | INTRAVENOUS | Status: DC
  Administered 2016-01-05 – 2016-01-11 (×12): 3 mL via INTRAVENOUS

## 2016-01-04 MED ORDER — ASPIRIN EC 81 MG PO TBEC
81.0000 mg | DELAYED_RELEASE_TABLET | Freq: Every day | ORAL | Status: DC
  Administered 2016-01-05 – 2016-01-10 (×6): 81 mg via ORAL
  Filled 2016-01-04 (×6): qty 1

## 2016-01-04 MED ORDER — ARIPIPRAZOLE 5 MG PO TABS
5.0000 mg | ORAL_TABLET | Freq: Two times a day (BID) | ORAL | Status: DC
  Administered 2016-01-05 – 2016-01-11 (×12): 5 mg via ORAL
  Filled 2016-01-04 (×13): qty 1

## 2016-01-04 NOTE — ED Notes (Signed)
Per EMS: Pt c/o of chest pain with inspiration right chest and also upper right/medial abdomen. Pt has clear lung sounds, however shallow and tachypneic. Pt was 87%  On RA, switched to 4L [place], and sat 92%. Pt on O2 PRN at home. Pt has hx of parkinsons, hypertension, aspiration pneumonia (April and March 2017), MI. Pt currently has fentanyl patch on left upper arm. Pt temp 98.8 but warm to touch.

## 2016-01-04 NOTE — ED Notes (Signed)
Took pt off non-rebreather and placed pt on [place] 6L with MD Archie Balboa approval. Pt O2 sat currently at 95%

## 2016-01-04 NOTE — ED Provider Notes (Signed)
[facility] Emergency Department Provider Note  ____________________________________________  Time seen: ~2000  I have reviewed the triage vital signs and the nursing notes.   HISTORY  Chief Complaint Pleurisy and Code Sepsis   History limited by: Not Limited   HPI John Mendoza is a 70 y.o. male who presents to the emergency department today because of concerns for chest pain, shortness of breath. The patient states that his symptoms started this morning. They've progressively gotten worse today. The pain is located primarily in his central and right chest. It is worse with deep breaths. He says that it reminds him of when he has had aspiration pneumonia in the past. The patient says he has also recently had a diagnosis of discitis and had an MRI done 2 days ago. He has had fevers today.   Past Medical History  Diagnosis Date  . Barrett esophagus   . Hypertension   . Hyperlipidemia   . Multiple pulmonary nodules   . CHF (congestive heart failure) ([place])   . Lumbar spinal stenosis   . COPD (chronic obstructive pulmonary disease) ([place])   . CAD (coronary artery disease)     on 2l home oxygen  . On home oxygen therapy   . DVT (deep venous thrombosis) ([place]) 11/2013; 02/01/2015    on coumadin  . Parkinson's disease ([place])   . Recurrent aspiration pneumonia ([place])   . Sleep apnea   . Hypothyroidism   . Type II diabetes mellitus ([place])   . Iron deficiency anemia   . GERD (gastroesophageal reflux disease)   . Seizures ([place])   . DJD (degenerative joint disease)   . Arthritis   . Chronic lower back pain   . History of gout   . Depression   . Anxiety   . Basal cell carcinoma   . Throat cancer ([place])   . Heart attack ([place]) 03/20/1995  . 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 Malnutrition of moderate degree 12/12/2015  . Aspiration pneumonia ([place])  11/29/2015  . Dehydration 03/07/2015  . Orthostatic hypotension 03/06/2015  . Hypotension 03/06/2015  . Constipation 02/05/2015  . Dysphagia causing pulmonary aspiration with swallowing 02/03/2015  . Diskitis   . 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 Discitis of lumbar region 02/01/2015  . Epidural abscess 02/01/2015  . Warfarin anticoagulation 02/01/2015  . Parkinsons disease ([place]) 02/01/2015  . Vertebral osteomyelitis ([place]) 02/01/2015  . Dysphagia/chronic 02/01/2015  . Bacteremia due to group B Streptococcus 01/28/2015  . Acute on chronic respiratory failure with hypoxia ([place]) 01/26/2015  . Sepsis ([place]) 01/26/2015  . History of DVT (deep vein thrombosis) 02/20/2014  . 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 Multiple pulmonary nodules 01/15/2012  . Aspiration pneumonitis ([place]) 01/15/2012  . Hypertension 01/15/2012  . Barrett's esophagus 01/15/2012  . CAD (coronary artery disease) 01/15/2012  . COPD (chronic obstructive pulmonary disease) ([place]) 01/15/2012    Past Surgical History  Procedure Laterality Date  . Anterior cervical decomp/discectomy fusion  X 2  . Radical neck dissection Right ~ 1998    "throat cancer"  . Total knee arthroplasty Right 2011  . Thyroidectomy  ~ 1996 X 2  . Cataract extraction w/ intraocular lens  implant, bilateral Bilateral   . Tonsillectomy    . Excisional hemorrhoidectomy    . Joint replacement    . Back surgery    .  Posterior fusion cervical spine  X 1  . Coronary angioplasty with stent placement    . Coronary artery bypass graft  1996    "CABG X3"  . Basal cell carcinoma excision      Current Outpatient Rx  Name  Route  Sig  Dispense  Refill  . acetaminophen (TYLENOL) 500 MG tablet   Oral   Take 500 mg by mouth every 6 (six) hours as needed.         Marland Kitchen amoxicillin-clavulanate (AUGMENTIN) 875-125 MG tablet   Oral   Take 1 tablet by mouth every 12 (twelve) hours.   16 tablet   0    . ARIPiprazole (ABILIFY) 5 MG tablet   Oral   Take 5 mg by mouth 2 (two) times daily.         . Artificial Tear Ointment (DRY EYES OP)   Both Eyes   Place 1 drop into both eyes daily as needed (dry eyes).         Marland Kitchen aspirin EC 81 MG tablet   Oral   Take 81 mg by mouth at bedtime.          Marland Kitchen atorvastatin (LIPITOR) 40 MG tablet   Oral   Take 40 mg by mouth at bedtime.         . bisacodyl (DULCOLAX) 10 MG suppository   Rectal   Place 1 suppository (10 mg total) rectally daily as needed for moderate constipation.   12 suppository   0   . bisoprolol (ZEBETA) 10 MG tablet   Oral   Take 10 mg by mouth daily.         . carbidopa-levodopa (SINEMET IR) 25-250 MG tablet   Oral   Take 1 tablet by mouth 3 (three) times daily.         . clonazePAM (KLONOPIN) 1 MG tablet   Oral   Take 1 mg by mouth at bedtime.         . fentaNYL (DURAGESIC - DOSED MCG/HR) 50 MCG/HR   Transdermal   Place 1 patch (50 mcg total) onto the skin every 3 (three) days.   10 patch   0   . HYDROcodone-acetaminophen (NORCO/VICODIN) 5-325 MG tablet   Oral   Take 1 tablet by mouth every 8 (eight) hours as needed for moderate pain.         Marland Kitchen lamoTRIgine (LAMICTAL) 25 MG tablet   Oral   Take 25 mg by mouth 2 (two) times daily.         Marland Kitchen levothyroxine (SYNTHROID, LEVOTHROID) 137 MCG tablet   Oral   Take 137 mcg by mouth daily.         Marland Kitchen losartan (COZAAR) 50 MG tablet   Oral   Take 1 tablet by mouth daily.         . metFORMIN (GLUCOPHAGE) 500 MG tablet   Oral   Take 500 mg by mouth 2 (two) times daily.         . nitroGLYCERIN (NITROSTAT) 0.4 MG SL tablet   Sublingual   Place 0.4 mg under the tongue every 5 (five) minutes x 3 doses as needed for chest pain.          Marland Kitchen omeprazole (PRILOSEC) 20 MG capsule   Oral   Take 40 mg by mouth 2 (two) times daily before a meal.         . PARoxetine (PAXIL) 20 MG tablet   Oral   Take 60 mg by  mouth daily.          . tamsulosin  (FLOMAX) 0.4 MG CAPS   Oral   Take 0.4 mg by mouth daily.          Marland Kitchen tiZANidine (ZANAFLEX) 4 MG tablet   Oral   Take 4 mg by mouth every 8 (eight) hours as needed for muscle spasms.         Marland Kitchen triamcinolone cream (KENALOG) 0.5 %   Topical   Apply 1 application topically 2 (two) times daily as needed.          . warfarin (COUMADIN) 5 MG tablet   Oral   Take 1 tablet (5 mg total) by mouth daily.   30 tablet   11     Allergies Other  Family History  Problem Relation Age of Onset  . Leukemia Paternal Grandmother   . Cancer Maternal Grandmother     stomach  . Cancer Paternal Aunt   . Other Father     bacterial endocarditis  . Rheum arthritis Father     Social History Social History  Substance Use Topics  . Smoking status: Former Smoker -- 2.00 packs/day for 35 years    Types: Cigarettes    Quit date: 03/20/1995  . Smokeless tobacco: Never Used  . Alcohol Use: Yes     Comment: 02/01/2015 "stopped drinking in ~ 1996; never had problem w/it"    Review of Systems  Constitutional: Negative for fever. Cardiovascular: Positive for chest pain. Respiratory: Positive for shortness of breath. Gastrointestinal: Negative for abdominal pain, vomiting and diarrhea. Neurological: Negative for headaches, focal weakness or numbness.  10-point ROS otherwise negative.  ____________________________________________   PHYSICAL EXAM:  VITAL SIGNS: ED Triage Vitals  Enc Vitals Group     BP 01/04/16 2000 109/64 mmHg     Pulse Rate 01/04/16 2000 92     Resp 01/04/16 2000 33     Temp 01/04/16 2054 98.5 F (36.9 C)     Temp Source 01/04/16 2054 Axillary     SpO2 01/04/16 1954 87 %     Weight 01/04/16 2005 159 lb (72.122 kg)     Height 01/04/16 2005 5\' 8"  (1.727 m)     Head Cir --      Peak Flow --      Pain Score 01/04/16 2031 10   Constitutional: Alert and oriented. Appears ill.Moderate respiratory distress Eyes: Conjunctivae are normal. PERRL. Normal extraocular  movements. ENT   Head: Normocephalic and atraumatic.   Nose: No congestion/rhinnorhea.   Mouth/Throat: Mucous membranes are moist.   Neck: No stridor. Hematological/Lymphatic/Immunilogical: No cervical lymphadenopathy. Cardiovascular: Tachycardic, regular rhythm.  No murmurs, rubs, or gallops. Respiratory: Tachypnea, increased respiratory effort, some rhonchi in bilateral bases. Gastrointestinal: Soft and nontender. No distention.  Genitourinary: Deferred Musculoskeletal: Normal range of motion in all extremities. No joint effusions.  No lower extremity tenderness nor edema. Neurologic:  Normal speech and language. No gross focal neurologic deficits are appreciated.  Skin:  Skin is warm, dry and intact. No rash noted. Psychiatric: Mood and affect are normal. Speech and behavior are normal. Patient exhibits appropriate insight and judgment.  ____________________________________________    LABS (pertinent positives/negatives)  Labs Reviewed  LACTIC ACID, PLASMA - Abnormal; Notable for the following:    Lactic Acid, Venous 2.3 (*)    All other components within normal limits  COMPREHENSIVE METABOLIC PANEL - Abnormal; Notable for the following:    Sodium 134 (*)    Chloride 95 (*)  Glucose, Bld 160 (*)    BUN 22 (*)    ALT 10 (*)    All other components within normal limits  TROPONIN I - Abnormal; Notable for the following:    Troponin I 0.04 (*)    All other components within normal limits  CBC WITH DIFFERENTIAL/PLATELET - Abnormal; Notable for the following:    RBC 4.36 (*)    Hemoglobin 11.6 (*)    HCT 35.3 (*)    RDW 17.6 (*)    Lymphs Abs 0.1 (*)    All other components within normal limits  CULTURE, BLOOD (ROUTINE X 2)  CULTURE, BLOOD (ROUTINE X 2)  URINE CULTURE  LACTIC ACID, PLASMA  URINALYSIS COMPLETEWITH MICROSCOPIC ([facility] ONLY)     ____________________________________________   EKG  I, Nance Pear, attending physician, personally viewed  and interpreted this EKG  EKG Time: 2002 Rate: 91 Rhythm: normal sinus rhythm Axis: normal Intervals: qtc 448 QRS: narrow, q waves III, aVF ST changes: no st elevation Impression: abnormal ekg   ____________________________________________    RADIOLOGY  CXR IMPRESSION: Bibasilar opacities, likely congestive changes with possible pneumonia. Clinical correlation is recommend  ____________________________________________   PROCEDURES  Procedure(s) performed: None  Critical Care performed: Yes, see critical care note(s)  CRITICAL CARE Performed by: Nance Pear   Total critical care time: 35 minutes  Critical care time was exclusive of separately billable procedures and treating other patients.  Critical care was necessary to treat or prevent imminent or life-threatening deterioration.  Critical care was time spent personally by me on the following activities: development of treatment plan with patient and/or surrogate as well as nursing, discussions with consultants, evaluation of patient's response to treatment, examination of patient, obtaining history from patient or surrogate, ordering and performing treatments and interventions, ordering and review of laboratory studies, ordering and review of radiographic studies, pulse oximetry and re-evaluation of patient's condition.  ____________________________________________   INITIAL IMPRESSION / ASSESSMENT AND PLAN / ED COURSE  Pertinent labs & imaging results that were available during my care of the patient were reviewed by me and considered in my medical decision making (see chart for details).  Patient presented to the emergency department today because of concerns for chest pain and shortness of breath. Initial vital signs concerning for sepsis giving increased respirations and low oxygen on room air. I discussed with patient he does say he has a history of aspiration pneumonia is concerned that this has  happened again. He does have a history of Parkinson's this might be returning to the recurrent aspiration pneumonia. The patient's exam is concerning for respiratory distress. He was put on oxygen which did help the oxygen saturation in his blood. Patient was given IV fluids multiple broad-spectrum IV antibiotics given concern for sepsis. Chest x-ray did it return concerning for pneumonias.  ____________________________________________   FINAL CLINICAL IMPRESSION(S) / ED DIAGNOSES  Final diagnoses:  Aspiration pneumonia of both lower lobes, unspecified aspiration pneumonia type [facility])     Note: This dictation was prepared with Dragon dictation. Any transcriptional errors that result from this process are unintentional    Nance Pear, MD 01/05/16 [phone]

## 2016-01-04 NOTE — ED Notes (Signed)
Took pt's fentanyl patch off left upper arm prior to administering morphine

## 2016-01-04 NOTE — H&P (Signed)
[facility] NAME: John Mendoza    MR#:  [id number]  DATE OF BIRTH:  1945-08-29  DATE OF ADMISSION:  01/04/2016  PRIMARY CARE PHYSICIAN: Adin Hector, MD   REQUESTING/REFERRING PHYSICIAN: Archie Balboa, MD  CHIEF COMPLAINT:   Chief Complaint  Patient presents with  . Pleurisy    possible sepsis  . Code Sepsis    HISTORY OF PRESENT ILLNESS:  John Mendoza  is a 70 y.o. male who presents with Sepsis from pneumonia, likely aspiration pneumonia. Patient has a known history of Parkinson's and has had aspiration events in the past. He states that for the past couple days he has started feeling worse. He specifically complains of pleuritic chest pain with deep breathing. His wife states that he had episodes of chills with fever at home earlier in the day today. On arrival to the ED he was hypoxic on room air with O2 sats in the mid 80s. This corrected well with supple and oxygen. Chest x-ray shows pneumonia. Lactic acid was elevated. He was started on broad-spectrum antibiotics with cultures sent from the ED. Hospitalists were called for admission.  PAST MEDICAL HISTORY:   Past Medical History  Diagnosis Date  . Barrett esophagus   . Hypertension   . Hyperlipidemia   . Multiple pulmonary nodules   . CHF (congestive heart failure) ([place])   . Lumbar spinal stenosis   . COPD (chronic obstructive pulmonary disease) ([place])   . CAD (coronary artery disease)     on 2l home oxygen  . On home oxygen therapy   . DVT (deep venous thrombosis) ([place]) 11/2013; 02/01/2015    on coumadin  . Parkinson's disease ([place])   . Recurrent aspiration pneumonia ([place])   . Sleep apnea   . Hypothyroidism   . Type II diabetes mellitus ([place])   . Iron deficiency anemia   . GERD (gastroesophageal reflux disease)   . Seizures ([place])   . DJD (degenerative joint disease)   . Arthritis   . Chronic lower back pain   . History of gout   . Depression   . Anxiety    . Basal cell carcinoma   . Throat cancer ([place])   . Heart attack ([place]) 03/20/1995  . 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 Anterior cervical decomp/discectomy fusion  X 2  . Radical neck dissection Right ~ 1998    "throat cancer"  . Total knee arthroplasty Right 2011  . Thyroidectomy  ~ 1996 X 2  . Cataract extraction w/ intraocular lens  implant, bilateral Bilateral   . Tonsillectomy    . Excisional hemorrhoidectomy    . Joint replacement    . Back surgery    . Posterior fusion cervical spine  X 1  . Coronary angioplasty with stent placement    . Coronary artery bypass graft  1996    "CABG X3"  . Basal cell carcinoma excision      SOCIAL HISTORY:   Social History  Substance Use Topics  . Smoking status: Former Smoker -- 2.00 packs/day for 35 years    Types: Cigarettes    Quit date: 03/20/1995  . Smokeless tobacco: Never Used  . Alcohol Use: Yes     Comment: 02/01/2015 "stopped drinking in ~ 1996; never had problem w/it"    FAMILY HISTORY:  Family History  Problem Relation Age of Onset  . Leukemia Paternal Grandmother   . Cancer Maternal Grandmother     stomach  . Cancer Paternal Aunt   . Other Father     bacterial endocarditis  . Rheum arthritis Father     DRUG ALLERGIES:   Allergies  Allergen Reactions  . Other Other (See Comments)    Dizziness, lightheadedness, Pt states that inhaled medications make him depressed and have negative thoughts.      MEDICATIONS AT HOME:   Prior to Admission medications   Medication Sig Start Date End Date Taking? Authorizing Provider  acetaminophen (TYLENOL) 500 MG tablet Take 500 mg by mouth every 6 (six) hours as needed.    Historical Provider, MD  amoxicillin-clavulanate (AUGMENTIN) 875-125 MG tablet Take 1 tablet by mouth every 12 (twelve) hours. 12/13/15   Fritzi Mandes, MD  ARIPiprazole  (ABILIFY) 5 MG tablet Take 5 mg by mouth 2 (two) times daily.    Historical Provider, MD  Artificial Tear Ointment (DRY EYES OP) Place 1 drop into both eyes daily as needed (dry eyes).    Historical Provider, MD  aspirin EC 81 MG tablet Take 81 mg by mouth at bedtime.     Historical Provider, MD  atorvastatin (LIPITOR) 40 MG tablet Take 40 mg by mouth at bedtime.    Historical Provider, MD  bisacodyl (DULCOLAX) 10 MG suppository Place 1 suppository (10 mg total) rectally daily as needed for moderate constipation. 02/01/15   Tama High III, MD  bisoprolol (ZEBETA) 10 MG tablet Take 10 mg by mouth daily.    Historical Provider, MD  carbidopa-levodopa (SINEMET IR) 25-250 MG tablet Take 1 tablet by mouth 3 (three) times daily.    Historical Provider, MD  clonazePAM (KLONOPIN) 1 MG tablet Take 1 mg by mouth at bedtime. 09/20/14   Historical Provider, MD  fentaNYL (DURAGESIC - DOSED MCG/HR) 50 MCG/HR Place 1 patch (50 mcg total) onto the skin every 3 (three) days. 02/08/15   Orson Eva, MD  HYDROcodone-acetaminophen (NORCO/VICODIN) 5-325 MG tablet Take 1 tablet by mouth every 8 (eight) hours as needed for moderate pain.    Historical Provider, MD  lamoTRIgine (LAMICTAL) 25 MG tablet Take 25 mg by mouth 2 (two) times daily.    Historical Provider, MD  levothyroxine (SYNTHROID, LEVOTHROID) 137 MCG tablet Take 137 mcg by mouth daily.    Historical Provider, MD  losartan (COZAAR) 50 MG tablet Take 1 tablet by mouth daily. 11/19/15   Historical Provider, MD  metFORMIN (GLUCOPHAGE) 500 MG tablet Take 500 mg by mouth 2 (two) times daily. 12/12/14   Historical Provider, MD  nitroGLYCERIN (NITROSTAT) 0.4 MG SL tablet Place 0.4 mg under the tongue every 5 (five) minutes x 3 doses as needed for chest pain.     Historical Provider, MD  omeprazole (PRILOSEC) 20 MG capsule Take 40 mg by mouth 2 (two) times daily before a meal.    Historical Provider, MD  PARoxetine (PAXIL) 20 MG tablet Take 60 mg by mouth daily.  07/30/10    Historical Provider, MD  tamsulosin (FLOMAX) 0.4 MG CAPS Take 0.4 mg by mouth daily.     Historical Provider, MD  tiZANidine (ZANAFLEX) 4 MG tablet Take 4 mg by mouth every 8 (eight) hours as needed for muscle spasms.    Historical Provider, MD  triamcinolone cream (KENALOG) 0.5 % Apply 1 application topically 2 (two) times daily as needed.  11/07/15   Historical Provider, MD  warfarin (COUMADIN) 5  MG tablet Take 1 tablet (5 mg total) by mouth daily. 03/08/15   Adin Hector, MD    REVIEW OF SYSTEMS:  Review of Systems  Constitutional: Positive for fever and chills. Negative for weight loss and malaise/fatigue.  HENT: Negative for ear pain, hearing loss and tinnitus.   Eyes: Negative for blurred vision, double vision, pain and redness.  Respiratory: Positive for cough, sputum production and shortness of breath. Negative for hemoptysis.   Cardiovascular: Positive for chest pain. Negative for palpitations, orthopnea and leg swelling.  Gastrointestinal: Negative for nausea, vomiting, abdominal pain, diarrhea and constipation.  Genitourinary: Negative for dysuria, frequency and hematuria.  Musculoskeletal: Negative for back pain, joint pain and neck pain.  Skin:       No acne, rash, or lesions  Neurological: Negative for dizziness, tremors, focal weakness and weakness.  Endo/Heme/Allergies: Negative for polydipsia. Does not bruise/bleed easily.  Psychiatric/Behavioral: Negative for depression. The patient is not nervous/anxious and does not have insomnia.      VITAL SIGNS:   Filed Vitals:   01/04/16 2000 01/04/16 2005 01/04/16 2030 01/04/16 2054  BP: 109/64  110/60 127/76  Pulse: 92  82 86  Temp:    98.5 F (36.9 C)  TempSrc:    Axillary  Resp: 33  28 34  Height:  5\' 8"  (1.727 m)    Weight:  72.122 kg (159 lb)    SpO2: 87% 87% 100% 100%   Wt Readings from Last 3 Encounters:  01/04/16 72.122 kg (159 lb)  12/11/15 72.122 kg (159 lb)  11/29/15 75.751 kg (167 lb)    PHYSICAL  EXAMINATION:  Physical Exam  Vitals reviewed. Constitutional: He is oriented to person, place, and time. He appears well-developed and well-nourished. No distress.  HENT:  Head: Normocephalic and atraumatic.  Mouth/Throat: Oropharynx is clear and moist.  Eyes: Conjunctivae and EOM are normal. Pupils are equal, round, and reactive to light. No scleral icterus.  Neck: Normal range of motion. Neck supple. No JVD present. No thyromegaly present.  Cardiovascular: Normal rate, regular rhythm and intact distal pulses.  Exam reveals no gallop and no friction rub.   No murmur heard. Respiratory: Effort normal. No respiratory distress. He has no wheezes. He has no rales.  BL lower lobe ronchi  GI: Soft. Bowel sounds are normal. He exhibits no distension. There is no tenderness.  Musculoskeletal: Normal range of motion. He exhibits no edema.  No arthritis, no gout  Lymphadenopathy:    He has no cervical adenopathy.  Neurological: He is alert and oriented to person, place, and time. No cranial nerve deficit.  No dysarthria, no aphasia  Skin: Skin is warm and dry. No rash noted. No erythema.  Psychiatric: He has a normal mood and affect. His behavior is normal. Judgment and thought content normal.    LABORATORY PANEL:   CBC  Recent Labs Lab 01/04/16 2003  WBC 6.0  HGB 11.6*  HCT 35.3*  PLT 196   ------------------------------------------------------------------------------------------------------------------  Chemistries   Recent Labs Lab 01/04/16 2003  NA 134*  K 4.0  CL 95*  CO2 30  GLUCOSE 160*  BUN 22*  CREATININE 0.91  CALCIUM 9.2  AST 18  ALT 10*  ALKPHOS 69  BILITOT 0.7   ------------------------------------------------------------------------------------------------------------------  Cardiac Enzymes  Recent Labs Lab 01/04/16 2003  TROPONINI 0.04*    ------------------------------------------------------------------------------------------------------------------  RADIOLOGY:  Dg Chest Port 1 View  01/04/2016  CLINICAL DATA:  70 year old male with chest pain EXAM: PORTABLE CHEST 1 VIEW COMPARISON:  Chest radiograph dated 12/11/2015 FINDINGS: Single portable view of chest demonstrates stable cardiomegaly with bibasilar centrally prominent opacities, likely congestive changes with possible superimposed pneumonia. There is no pleural effusion or pneumothorax. Median sternotomy wires and CABG vascular clips noted. No acute osseous pathology. IMPRESSION: Bibasilar opacities, likely congestive changes with possible pneumonia. Clinical correlation is recommend Electronically Signed   By: Anner Crete M.D.   On: 01/04/2016 20:24    EKG:   Orders placed or performed during the hospital encounter of 01/04/16  . EKG 12-Lead  . EKG 12-Lead  . ED EKG  . ED EKG    IMPRESSION AND PLAN:  Principal Problem:   Sepsis ([place]) - hemodynamically stable, broad-spectrum antibiotics started in the ED and continued on admission, lactic acid mildly elevated, IV fluids to treat the same and follow-up repeat lactic acid, and cultures sent from the ED Active Problems:   Aspiration pneumonia (HCC) - IV antibiotics and cultures as above, add sputum culture   Hypertension - currently normotensive, hold home antihypertensives for now   CAD (coronary artery disease) - continue home meds   Parkinsons disease ([place]) - continue home meds   Hypothyroidism - continue home dose thyroid replacement  All the records are reviewed and case discussed with ED provider. Management plans discussed with the patient and/or family.  DVT PROPHYLAXIS: Systemic anticoagulation  GI PROPHYLAXIS: PPI  ADMISSION STATUS: Inpatient  CODE STATUS: Full Code Status History    Date Active Date Inactive Code Status Order ID Comments User Context   12/11/2015  5:09 PM 12/13/2015  4:53 PM  Full Code [id number]  Fritzi Mandes, MD Inpatient   11/29/2015  9:13 PM 12/01/2015  1:58 PM Full Code [id number]  Gladstone Lighter, MD Inpatient   03/06/2015 10:40 AM 03/08/2015  2:22 PM Full Code [id number]  Adin Hector, MD Inpatient   02/01/2015  5:36 PM 02/08/2015  6:46 PM Full Code [id number]  Samella Parr, NP Inpatient   01/26/2015 10:05 PM 02/01/2015  5:36 PM Full Code [id number]  Demetrios Loll, MD Inpatient      TOTAL TIME TAKING CARE OF THIS PATIENT: 45 minutes.    Braelynne Garinger [place] 01/04/2016, 9:42 PM  [facility]  Office  [phone]  CC: Primary care physician; Adin Hector, MD

## 2016-01-05 ENCOUNTER — Encounter: Payer: Self-pay | Admitting: Adult Health

## 2016-01-05 DIAGNOSIS — A401 Sepsis due to streptococcus, group B: Secondary | ICD-10-CM

## 2016-01-05 DIAGNOSIS — J690 Pneumonitis due to inhalation of food and vomit: Secondary | ICD-10-CM

## 2016-01-05 DIAGNOSIS — J9621 Acute and chronic respiratory failure with hypoxia: Secondary | ICD-10-CM

## 2016-01-05 LAB — BLOOD GAS, ARTERIAL
Acid-base deficit: 0.7 mmol/L (ref 0.0–2.0)
Bicarbonate: 25.4 mEq/L (ref 21.0–28.0)
Delivery systems: POSITIVE
EXPIRATORY PAP: 5
FIO2: 0.7
Inspiratory PAP: 16
MECHANICAL RATE: 12
O2 SAT: 88.9 %
PH ART: 7.34 — AB (ref 7.350–7.450)
Patient temperature: 37
pCO2 arterial: 47 mmHg (ref 32.0–48.0)
pO2, Arterial: 60 mmHg — ABNORMAL LOW (ref 83.0–108.0)

## 2016-01-05 LAB — CBC
HEMATOCRIT: 31.7 % — AB (ref 40.0–52.0)
Hemoglobin: 10.3 g/dL — ABNORMAL LOW (ref 13.0–18.0)
MCH: 26.6 pg (ref 26.0–34.0)
MCHC: 32.3 g/dL (ref 32.0–36.0)
MCV: 82.3 fL (ref 80.0–100.0)
PLATELETS: 157 10*3/uL (ref 150–440)
RBC: 3.86 MIL/uL — AB (ref 4.40–5.90)
RDW: 18 % — ABNORMAL HIGH (ref 11.5–14.5)
WBC: 2.8 10*3/uL — AB (ref 3.8–10.6)

## 2016-01-05 LAB — BLOOD CULTURE ID PANEL (REFLEXED)
Acinetobacter baumannii: NOT DETECTED
CANDIDA PARAPSILOSIS: NOT DETECTED
CANDIDA TROPICALIS: NOT DETECTED
CARBAPENEM RESISTANCE: NOT DETECTED
Candida albicans: NOT DETECTED
Candida glabrata: NOT DETECTED
Candida krusei: NOT DETECTED
ENTEROBACTERIACEAE SPECIES: DETECTED — AB
ENTEROCOCCUS SPECIES: NOT DETECTED
Enterobacter cloacae complex: NOT DETECTED
Escherichia coli: NOT DETECTED
Haemophilus influenzae: NOT DETECTED
KLEBSIELLA PNEUMONIAE: DETECTED — AB
Klebsiella oxytoca: NOT DETECTED
Listeria monocytogenes: NOT DETECTED
Methicillin resistance: NOT DETECTED
Neisseria meningitidis: NOT DETECTED
PROTEUS SPECIES: NOT DETECTED
PSEUDOMONAS AERUGINOSA: NOT DETECTED
STAPHYLOCOCCUS AUREUS BCID: NOT DETECTED
STAPHYLOCOCCUS SPECIES: NOT DETECTED
Serratia marcescens: NOT DETECTED
Streptococcus agalactiae: NOT DETECTED
Streptococcus pneumoniae: NOT DETECTED
Streptococcus pyogenes: NOT DETECTED
Streptococcus species: NOT DETECTED
VANCOMYCIN RESISTANCE: NOT DETECTED

## 2016-01-05 LAB — GLUCOSE, CAPILLARY
GLUCOSE-CAPILLARY: 116 mg/dL — AB (ref 65–99)
GLUCOSE-CAPILLARY: 123 mg/dL — AB (ref 65–99)
Glucose-Capillary: 91 mg/dL (ref 65–99)

## 2016-01-05 LAB — TSH: TSH: 3.805 u[IU]/mL (ref 0.350–4.500)

## 2016-01-05 LAB — PROTIME-INR
INR: 3.06
INR: 3.2
PROTHROMBIN TIME: 32.1 s — AB (ref 11.4–15.0)
Prothrombin Time: 31.1 seconds — ABNORMAL HIGH (ref 11.4–15.0)

## 2016-01-05 LAB — BASIC METABOLIC PANEL
ANION GAP: 8 (ref 5–15)
BUN: 24 mg/dL — ABNORMAL HIGH (ref 6–20)
CO2: 28 mmol/L (ref 22–32)
Calcium: 8.2 mg/dL — ABNORMAL LOW (ref 8.9–10.3)
Chloride: 99 mmol/L — ABNORMAL LOW (ref 101–111)
Creatinine, Ser: 0.83 mg/dL (ref 0.61–1.24)
GFR calc Af Amer: >60 mL/min (ref >60)
Glucose, Bld: 106 mg/dL — ABNORMAL HIGH (ref 65–99)
POTASSIUM: 4.9 mmol/L (ref 3.5–5.1)
SODIUM: 135 mmol/L (ref 135–145)

## 2016-01-05 LAB — TROPONIN I
Troponin I: 0.11 ng/mL (ref <0.03)
Troponin I: 0.07 ng/mL (ref <0.03)

## 2016-01-05 LAB — PHOSPHORUS: PHOSPHORUS: 3.2 mg/dL (ref 2.5–4.6)

## 2016-01-05 LAB — MAGNESIUM: Magnesium: 1.1 mg/dL — ABNORMAL LOW (ref 1.7–2.4)

## 2016-01-05 LAB — LACTIC ACID, PLASMA: LACTIC ACID, VENOUS: 2.1 mmol/L — AB (ref 0.5–1.9)

## 2016-01-05 MED ORDER — FUROSEMIDE 10 MG/ML IJ SOLN: 40.0000 mg | Freq: Once | INTRAMUSCULAR | Status: DC

## 2016-01-05 MED ORDER — INSULIN ASPART 100 UNIT/ML ~~LOC~~ SOLN
2.0000 [IU] | SUBCUTANEOUS | Status: DC
  Administered 2016-01-05 – 2016-01-06 (×2): 2 [IU] via SUBCUTANEOUS
  Filled 2016-01-05: qty 2

## 2016-01-05 MED ORDER — FUROSEMIDE 10 MG/ML IJ SOLN
INTRAMUSCULAR | Status: AC
  Filled 2016-01-05: qty 4

## 2016-01-05 MED ORDER — HYDROCORTISONE NA SUCCINATE PF 100 MG IJ SOLR
100.0000 mg | Freq: Four times a day (QID) | INTRAMUSCULAR | Status: DC
  Administered 2016-01-05 (×2): 100 mg via INTRAVENOUS
  Filled 2016-01-05 (×2): qty 2

## 2016-01-05 MED ORDER — WARFARIN - PHARMACIST DOSING INPATIENT
Freq: Every day | Status: DC
Start: 2016-01-05 — End: 2016-01-09
  Administered 2016-01-08: 18:00:00

## 2016-01-05 MED ORDER — NOREPINEPHRINE 4 MG/250ML-% IV SOLN: 2.0000 ug/min | INTRAVENOUS | Status: DC

## 2016-01-05 MED ORDER — SODIUM CHLORIDE 0.9 % IV SOLN
3.0000 g | Freq: Four times a day (QID) | INTRAVENOUS | Status: DC
  Administered 2016-01-05 – 2016-01-11 (×24): 3 g via INTRAVENOUS
  Filled 2016-01-05 (×32): qty 3

## 2016-01-05 MED ORDER — NOREPINEPHRINE BITARTRATE 1 MG/ML IV SOLN
2.0000 ug/min | INTRAVENOUS | Status: DC
  Filled 2016-01-05: qty 4

## 2016-01-05 MED ORDER — CLONAZEPAM 0.5 MG PO TABS
0.5000 mg | ORAL_TABLET | Freq: Every evening | ORAL | Status: DC | PRN
Start: 2016-01-05 — End: 2016-01-11
  Administered 2016-01-05 – 2016-01-10 (×6): 0.5 mg via ORAL
  Filled 2016-01-05 (×6): qty 1

## 2016-01-05 MED ORDER — HYDROCORTISONE NA SUCCINATE PF 100 MG IJ SOLR
50.0000 mg | Freq: Two times a day (BID) | INTRAMUSCULAR | Status: DC
  Administered 2016-01-06: 50 mg via INTRAVENOUS
  Filled 2016-01-05: qty 2

## 2016-01-05 MED ORDER — DEXTROSE-NACL 5-0.45 % IV SOLN
INTRAVENOUS | Status: DC
  Administered 2016-01-05 – 2016-01-10 (×4): via INTRAVENOUS

## 2016-01-05 MED ORDER — WARFARIN SODIUM 5 MG PO TABS: 5.0000 mg | ORAL_TABLET | Freq: Every day | ORAL | Status: DC

## 2016-01-05 MED ORDER — HYDROCORTISONE NA SUCCINATE PF 100 MG IJ SOLR: 100.0000 mg | Freq: Four times a day (QID) | INTRAMUSCULAR | Status: DC

## 2016-01-05 MED ORDER — HYDRALAZINE HCL 20 MG/ML IJ SOLN
10.0000 mg | Freq: Four times a day (QID) | INTRAMUSCULAR | Status: DC | PRN
Start: 2016-01-05 — End: 2016-01-11
  Filled 2016-01-05 (×2): qty 1

## 2016-01-05 NOTE — Evaluation (Signed)
Clinical/Bedside Swallow Evaluation Patient Details  Name: John Mendoza MRN: [id number] Date of Birth: 12/05/1945  Today's Date: 01/05/2016 Time: SLP Start Time (ACUTE ONLY): 1048 SLP Stop Time (ACUTE ONLY): 1120 SLP Time Calculation (min) (ACUTE ONLY): 32 min  Past Medical History:  Past Medical History  Diagnosis Date  . Barrett esophagus   . Hypertension   . Hyperlipidemia   . Multiple pulmonary nodules   . CHF (congestive heart failure) ([place])   . Lumbar spinal stenosis   . COPD (chronic obstructive pulmonary disease) ([place])   . CAD (coronary artery disease)     on 2l home oxygen  . On home oxygen therapy   . DVT (deep venous thrombosis) ([place]) 11/2013; 02/01/2015    on coumadin  . Parkinson's disease ([place])   . Recurrent aspiration pneumonia ([place])   . Sleep apnea   . Hypothyroidism   . Type II diabetes mellitus ([place])   . Iron deficiency anemia   . GERD (gastroesophageal reflux disease)   . Seizures ([place])   . DJD (degenerative joint disease)   . Arthritis   . Chronic lower back pain   . History of gout   . Depression   . Anxiety   . Basal cell carcinoma   . Throat cancer ([place])   . Heart attack ([place]) 03/20/1995  . 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 Anterior cervical decomp/discectomy fusion  X 2  . Radical neck dissection Right ~ 1998    "throat cancer"  . Total knee arthroplasty Right 2011  . Thyroidectomy  ~ 1996 X 2  . Cataract extraction w/ intraocular lens  implant, bilateral Bilateral   . Tonsillectomy    . Excisional hemorrhoidectomy    . Joint replacement    . Back surgery    . Posterior fusion cervical spine  X 1  . Coronary angioplasty with stent placement    . Coronary artery bypass graft  1996    "CABG X3"  . Basal cell carcinoma excision     HPI:      Assessment / Plan / Recommendation Clinical Impression  pt presents  with a severe oral pharyngeal dysphagia characterized by oral holding of pure with honey thick liquids. pt may benefit from another form of nutritional intake. SLP discussed with md and nursing possible puree with pudding thick liquids for nutritional support. MD stated no diet at this time for pt to remain npo and PEG tube consult. pt noted to have oral holding and throat clear with all po intake. pt is at high risk of aspiration and aspiration pna.    Aspiration Risk  Risk for inadequate nutrition/hydration;Severe aspiration risk    Diet Recommendation NPO        Other  Recommendations Oral Care Recommendations: Oral care BID   Follow up Recommendations       Frequency and Duration min 3x week  1 week       Prognosis Prognosis for Safe Diet Advancement: Guarded Barriers to Reach Goals: Severity of deficits      Swallow Study   General Date of Onset: 01/04/16 Type of Study: Bedside Swallow Evaluation Diet Prior to this Study: NPO Temperature Spikes Noted: No Respiratory Status: Nasal cannula History of Recent Intubation: No Behavior/Cognition: Alert;Cooperative;Pleasant mood Oral Cavity Assessment: Within Functional Limits Oral Care Completed by SLP: No Oral Cavity - Dentition: Missing dentition  Vision: Functional for self-feeding Self-Feeding Abilities: Able to feed self Patient Positioning: Upright in bed Baseline Vocal Quality: Breathy Volitional Cough: Weak Volitional Swallow: Unable to elicit    Oral/Motor/Sensory Function Overall Oral Motor/Sensory Function: Within functional limits   Ice Chips Ice chips: Not tested   Thin Liquid Thin Liquid: Not tested    Nectar Thick Nectar Thick Liquid: Not tested   Honey Thick Honey Thick Liquid: Impaired Pharyngeal Phase Impairments: Wet Vocal Quality;Cough - Delayed;Cough - Immediate;Throat Clearing - Immediate;Multiple swallows;Suspected delayed Swallow   Puree Puree: Impaired Oral Phase Functional Implications: Oral  residue;Oral holding Pharyngeal Phase Impairments: Suspected delayed Swallow;Throat Clearing - Immediate;Cough - Immediate;Multiple swallows   Solid   GO   Solid: Not tested        [place] Sauber 01/05/2016,11:37 AM

## 2016-01-05 NOTE — Progress Notes (Signed)
Responded to Rapid. Pt on 100% NRB mask. Audible crackles. Pt O2 saturation was 64%. Pt coughing up pink frothy secretions. Pt placed on Bipap. Pt tolerating well.

## 2016-01-05 NOTE — Progress Notes (Signed)
Initial Nutrition Assessment  DOCUMENTATION CODES:   Non-severe (moderate) malnutrition in context of chronic illness  INTERVENTION:  -Recommend addtion of Mighty Shake and Magic Cup once diet advanced; pt likes chocolate shakes -Await diet progression, SLP eval pending    NUTRITION DIAGNOSIS:   Malnutrition related to chronic illness as evidenced by mild depletion of body fat, moderate depletions of muscle mass, energy intake < or equal to 75% for > or equal to 1 month.  GOAL:   Patient will meet greater than or equal to 90% of their needs  MONITOR:   Diet advancement, PO intake, Labs, Weight trends  REASON FOR ASSESSMENT:   Malnutrition Screening Tool    ASSESSMENT:   70 yo male admitted with sepsis with aspiration pneumonia; pt with hx of dysphagia and Parkinson's Disease  Pt transferred to the unit during the night for respiratory distress and placed on Bipap; pt reports he feels better this AM, although reports his legs feel very weak  Pt takes puree diet with nectar thick liquids at baseline; pt has hx of PEG tube placed in 2016 but has been removed. Pt reports appetite has been poor, eats 2-3 times per day but mostly bites and sips each time. Pt sometimes supplements with Ensure; pt mentions using something called Goya as well. Pt endorses wt loss but cannot tell writer how much; per wt encounters, 13.5% wt loss in 10 months.   Nutrition-Focused physical exam completed. Findings are mild fat depletion, mild/moderate muscle depletion, and no edema.    Past Medical History  Diagnosis Date  . Barrett esophagus   . Hypertension   . Hyperlipidemia   . Multiple pulmonary nodules   . CHF (congestive heart failure) ([place])   . Lumbar spinal stenosis   . COPD (chronic obstructive pulmonary disease) ([place])   . CAD (coronary artery disease)     on 2l home oxygen  . On home oxygen therapy   . DVT (deep venous thrombosis) ([place]) 11/2013; 02/01/2015    on coumadin  .  Parkinson's disease ([place])   . Recurrent aspiration pneumonia ([place])   . Sleep apnea   . Hypothyroidism   . Type II diabetes mellitus ([place])   . Iron deficiency anemia   . GERD (gastroesophageal reflux disease)   . Seizures ([place])   . DJD (degenerative joint disease)   . Arthritis   . Chronic lower back pain   . History of gout   . Depression   . Anxiety   . Basal cell carcinoma   . Throat cancer ([place])   . Heart attack ([place]) 03/20/1995  . 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  Estimated Nutritional Needs:   Kcal:  2100-2500 kcals   Protein:  86-108 g   Fluid:  >/= 2 L  EDUCATION NEEDS:   No education needs identified at this time  [place], [place], [place] [phone] Pager  [phone] Weekend/On-Call Pager

## 2016-01-05 NOTE — Progress Notes (Signed)
RN responded to Rapid Response Page, patient in flash pulmonary edema, transferred to ICU for BIPAP.

## 2016-01-05 NOTE — Progress Notes (Signed)
Notified Dr. Marcille Blanco of bladder scan revealing 417 in bladder. Continue to monitor, no new orders received at this time.

## 2016-01-05 NOTE — Progress Notes (Signed)
PHARMACY - PHYSICIAN COMMUNICATION CRITICAL VALUE ALERT - BLOOD CULTURE IDENTIFICATION (BCID)  Results for orders placed or performed during the hospital encounter of 01/04/16  Blood Culture ID Panel (Reflexed) (Collected: 01/04/2016  8:25 PM)  Result Value Ref Range   Enterococcus species NOT DETECTED NOT DETECTED   Vancomycin resistance NOT DETECTED NOT DETECTED   Listeria monocytogenes NOT DETECTED NOT DETECTED   Staphylococcus species NOT DETECTED NOT DETECTED   Staphylococcus aureus NOT DETECTED NOT DETECTED   Methicillin resistance NOT DETECTED NOT DETECTED   Streptococcus species NOT DETECTED NOT DETECTED   Streptococcus agalactiae NOT DETECTED NOT DETECTED   Streptococcus pneumoniae NOT DETECTED NOT DETECTED   Streptococcus pyogenes NOT DETECTED NOT DETECTED   Acinetobacter baumannii NOT DETECTED NOT DETECTED   Enterobacteriaceae species DETECTED (A) NOT DETECTED   Enterobacter cloacae complex NOT DETECTED NOT DETECTED   Escherichia coli NOT DETECTED NOT DETECTED   Klebsiella oxytoca NOT DETECTED NOT DETECTED   Klebsiella pneumoniae DETECTED (A) NOT DETECTED   Proteus species NOT DETECTED NOT DETECTED   Serratia marcescens NOT DETECTED NOT DETECTED   Carbapenem resistance NOT DETECTED NOT DETECTED   Haemophilus influenzae NOT DETECTED NOT DETECTED   Neisseria meningitidis NOT DETECTED NOT DETECTED   Pseudomonas aeruginosa NOT DETECTED NOT DETECTED   Candida albicans NOT DETECTED NOT DETECTED   Candida glabrata NOT DETECTED NOT DETECTED   Candida krusei NOT DETECTED NOT DETECTED   Candida parapsilosis NOT DETECTED NOT DETECTED   Candida tropicalis NOT DETECTED NOT DETECTED    Name of physician (or Provider) Contacted: Fritzi Mandes  Changes to prescribed antibiotics required: Currently ordered vancomycin and zosyn, treatment algorithm suggests ceftriaxone.  Discussed with MD. Patient with aspiration pneumonia so would like to cover more broadly. Will discontinue vancomycin  and zosyn, and initiate unasyn.  Lorin Gawron C 01/05/2016  11:33 AM

## 2016-01-05 NOTE — Progress Notes (Signed)
Pharmacy Antibiotic Note  John Mendoza is a 70 y.o. male admitted on 01/04/2016 with pneumonia, possibly aspiration.  Pharmacy has been consulted for Vancomycin and Zosyn dosing.  Ke=0.065 T1/2=11hr Vd=50.5L  Plan: Vancomycin 1g IV every 12 hours.  Goal trough 15-20 mcg/mL. Zosyn 3.375g IV q8h (4 hour infusion). Will check trough level prior to 5th dose.   Height: 5\' 8"  (172.7 cm) Weight: 159 lb (72.122 kg) IBW/kg (Calculated) : 68.4  Temp (24hrs), Avg:99.4 F (37.4 C), Min:98.5 F (36.9 C), Max:100.4 F (38 C)   Recent Labs Lab 01/04/16 2003 01/04/16 2008  WBC 6.0  --   CREATININE 0.91  --   LATICACIDVEN  --  2.3*    Estimated Creatinine Clearance: 73.1 mL/min (by C-G formula based on Cr of 0.91).    Allergies  Allergen Reactions  . Other Other (See Comments)    Dizziness, lightheadedness, Pt states that inhaled medications make him depressed and have negative thoughts.      Antimicrobials this admission: Vancomycin 6/30 >>  Zosyn 6/30 >>   Dose adjustments this admission:  Microbiology results:  Thank you for allowing pharmacy to be a part of this patient's care.  Paulina Fusi, PharmD, BCPS 01/05/2016 12:33 AM

## 2016-01-05 NOTE — Progress Notes (Signed)
ANTICOAGULATION CONSULT NOTE -Follow up [place] for Warfarin Dosing Indication: DVT  Allergies  Allergen Reactions  . Other Other (See Comments)    Dizziness, lightheadedness, Pt states that inhaled medications make him depressed and have negative thoughts.      Patient Measurements: Height: 5\' 8"  (172.7 cm) Weight: 159 lb (72.122 kg) IBW/kg (Calculated) : 68.4 Heparin Dosing Weight: na  Vital Signs: Temp: 99.3 F (37.4 C) (07/01 0700) Temp Source: Axillary (07/01 0700) BP: 143/82 mmHg (07/01 0900) Pulse Rate: 86 (07/01 0900)  Labs:  Recent Labs  01/04/16 2003 01/05/16 0001 01/05/16 0632  HGB 11.6*  --  10.3*  HCT 35.3*  --  31.7*  PLT 196  --  157  LABPROT  --  31.1* 32.1*  INR  --  3.06 3.20  CREATININE 0.91  --  0.83  TROPONINI 0.04*  --  0.11*    Estimated Creatinine Clearance: 80.1 mL/min (by C-G formula based on Cr of 0.83).   Medical History: Past Medical History  Diagnosis Date  . Barrett esophagus   . Hypertension   . Hyperlipidemia   . Multiple pulmonary nodules   . CHF (congestive heart failure) ([place])   . Lumbar spinal stenosis   . COPD (chronic obstructive pulmonary disease) ([place])   . CAD (coronary artery disease)     on 2l home oxygen  . On home oxygen therapy   . DVT (deep venous thrombosis) ([place]) 11/2013; 02/01/2015    on coumadin  . Parkinson's disease ([place])   . Recurrent aspiration pneumonia ([place])   . Sleep apnea   . Hypothyroidism   . Type II diabetes mellitus ([place])   . Iron deficiency anemia   . GERD (gastroesophageal reflux disease)   . Seizures ([place])   . DJD (degenerative joint disease)   . Arthritis   . Chronic lower back pain   . History of gout   . Depression   . Anxiety   . Basal cell carcinoma   . Throat cancer ([place])   . Heart attack ([place]) 03/20/1995  . 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 acetaminophen (TYLENOL) 500 MG tablet Take 500 mg by mouth every 6 (six) hours as needed.   prn at prn  . ARIPiprazole (ABILIFY) 5 MG tablet Take 5 mg by mouth 2 (two) times daily.   01/04/2016 at Unknown time  . Artificial Tear Ointment (DRY EYES OP) Place 1 drop into both eyes daily as needed (dry eyes).   prn at prn  . aspirin EC 81 MG tablet Take 81 mg by mouth at bedtime.    01/03/2016 at Unknown time  . atorvastatin (LIPITOR) 40 MG tablet Take 40 mg by mouth at bedtime.   01/03/2016 at Unknown time  . bisoprolol (ZEBETA) 10 MG tablet Take 10 mg by mouth daily.   01/04/2016 at Unknown time  . carbidopa-levodopa (SINEMET IR) 25-250 MG tablet Take 2.5 tablets by mouth 3 (three) times daily.    01/04/2016 at Unknown time  . clonazePAM (KLONOPIN) 1 MG tablet Take 1 mg by mouth at bedtime.   01/03/2016 at Unknown time  . fentaNYL (DURAGESIC - DOSED MCG/HR) 50 MCG/HR Place 1 patch (50 mcg total) onto the skin every 3 (three) days. 10 patch 0 01/02/2016  . HYDROcodone-acetaminophen (NORCO/VICODIN) 5-325 MG tablet Take 0.5 tablets by mouth every  8 (eight) hours as needed for moderate pain.    prn at prn  . lamoTRIgine (LAMICTAL) 25 MG tablet Take 25 mg by mouth 2 (two) times daily.   01/04/2016 at Unknown time  . levothyroxine (SYNTHROID, LEVOTHROID) 137 MCG tablet Take 137 mcg by mouth daily.   01/04/2016 at Unknown time  . losartan (COZAAR) 25 MG tablet Take 25 mg by mouth at bedtime.   01/03/2016 at Unknown time  . losartan (COZAAR) 50 MG tablet Take 1 tablet by mouth at bedtime.    01/03/2016 at Unknown time  . metFORMIN (GLUCOPHAGE) 500 MG tablet Take 500 mg by mouth 2 (two) times daily.   01/04/2016 at Unknown time  . nitroGLYCERIN (NITROSTAT) 0.4 MG SL tablet Place 0.4 mg under the tongue every 5 (five) minutes x 3 doses as needed for chest pain.    prn at prn  . omeprazole (PRILOSEC) 20 MG capsule Take 20 mg by mouth 2 (two) times daily before a meal.    01/04/2016 at  Unknown time  . PARoxetine (PAXIL) 20 MG tablet Take 60 mg by mouth daily.    01/04/2016 at Unknown time  . tamsulosin (FLOMAX) 0.4 MG CAPS Take 0.4 mg by mouth daily.    01/04/2016 at Unknown time  . tiZANidine (ZANAFLEX) 4 MG tablet Take 4 mg by mouth every 8 (eight) hours as needed for muscle spasms.   prn at prn  . triamcinolone cream (KENALOG) 0.5 % Apply 1 application topically 2 (two) times daily as needed (rash).    prn at prn  . warfarin (COUMADIN) 5 MG tablet Take 1 tablet (5 mg total) by mouth daily. 30 tablet 11 01/03/2016 at Unknown time  . bisacodyl (DULCOLAX) 10 MG suppository Place 1 suppository (10 mg total) rectally daily as needed for moderate constipation. 12 suppository 0 PRN at Unknown time    Assessment: Patient was taking Coumadin 5mg  daily prior to admission for history of DVT.  6/30 INR 3.06  None charted/ordered 7/1   INR 3.20  Hold dose  Goal of Therapy:  INR 2-3 Monitor platelets by anticoagulation protocol: Yes   Plan:  Will hold Coumadin dose today. F/u with am labs. Patient on Vancomycin/zosyn. Will check daily INR levels.  Chinita Greenland PharmD Clinical Pharmacist 01/05/2016  10:25 AM

## 2016-01-05 NOTE — Progress Notes (Signed)
Pre cath >434 ml urine via bladder scanner. Attempted a foley, pt reports prostate enlargement. Donnamae Jude RN attempted to insert foley, foley curled up and could not be advance. Patient was able to urinate around foley. Post scan 111 ml.

## 2016-01-05 NOTE — Progress Notes (Signed)
ANTICOAGULATION CONSULT NOTE - Initial Consult  Pharmacy Consult for Warfarin Dosing Indication: DVT  Allergies  Allergen Reactions  . Other Other (See Comments)    Dizziness, lightheadedness, Pt states that inhaled medications make him depressed and have negative thoughts.      Patient Measurements: Height: 5\' 8"  (172.7 cm) Weight: 159 lb (72.122 kg) IBW/kg (Calculated) : 68.4 Heparin Dosing Weight: na  Vital Signs: Temp: 99.3 F (37.4 C) (06/30 2245) Temp Source: Oral (06/30 2245) BP: 139/75 mmHg (06/30 2245) Pulse Rate: 92 (06/30 2245)  Labs:  Recent Labs  01/04/16 2003 01/05/16 0001  HGB 11.6*  --   HCT 35.3*  --   PLT 196  --   LABPROT  --  31.1*  INR  --  3.06  CREATININE 0.91  --   TROPONINI 0.04*  --     Estimated Creatinine Clearance: 73.1 mL/min (by C-G formula based on Cr of 0.91).   Medical History: Past Medical History  Diagnosis Date  . Barrett esophagus   . Hypertension   . Hyperlipidemia   . Multiple pulmonary nodules   . CHF (congestive heart failure) ([place])   . Lumbar spinal stenosis   . COPD (chronic obstructive pulmonary disease) ([place])   . CAD (coronary artery disease)     on 2l home oxygen  . On home oxygen therapy   . DVT (deep venous thrombosis) ([place]) 11/2013; 02/01/2015    on coumadin  . Parkinson's disease ([place])   . Recurrent aspiration pneumonia ([place])   . Sleep apnea   . Hypothyroidism   . Type II diabetes mellitus ([place])   . Iron deficiency anemia   . GERD (gastroesophageal reflux disease)   . Seizures ([place])   . DJD (degenerative joint disease)   . Arthritis   . Chronic lower back pain   . History of gout   . Depression   . Anxiety   . Basal cell carcinoma   . Throat cancer ([place])   . Heart attack ([place]) 03/20/1995  . 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 acetaminophen (TYLENOL)  500 MG tablet Take 500 mg by mouth every 6 (six) hours as needed.   prn at prn  . ARIPiprazole (ABILIFY) 5 MG tablet Take 5 mg by mouth 2 (two) times daily.   01/04/2016 at Unknown time  . Artificial Tear Ointment (DRY EYES OP) Place 1 drop into both eyes daily as needed (dry eyes).   prn at prn  . aspirin EC 81 MG tablet Take 81 mg by mouth at bedtime.    01/03/2016 at Unknown time  . atorvastatin (LIPITOR) 40 MG tablet Take 40 mg by mouth at bedtime.   01/03/2016 at Unknown time  . bisoprolol (ZEBETA) 10 MG tablet Take 10 mg by mouth daily.   01/04/2016 at Unknown time  . carbidopa-levodopa (SINEMET IR) 25-250 MG tablet Take 2.5 tablets by mouth 3 (three) times daily.    01/04/2016 at Unknown time  . clonazePAM (KLONOPIN) 1 MG tablet Take 1 mg by mouth at bedtime.   01/03/2016 at Unknown time  . fentaNYL (DURAGESIC - DOSED MCG/HR) 50 MCG/HR Place 1 patch (50 mcg total) onto the skin every 3 (three) days. 10 patch 0 01/02/2016  . HYDROcodone-acetaminophen (NORCO/VICODIN) 5-325 MG tablet Take 0.5 tablets by mouth every 8 (eight) hours as needed for moderate pain.  prn at prn  . lamoTRIgine (LAMICTAL) 25 MG tablet Take 25 mg by mouth 2 (two) times daily.   01/04/2016 at Unknown time  . levothyroxine (SYNTHROID, LEVOTHROID) 137 MCG tablet Take 137 mcg by mouth daily.   01/04/2016 at Unknown time  . losartan (COZAAR) 25 MG tablet Take 25 mg by mouth at bedtime.   01/03/2016 at Unknown time  . losartan (COZAAR) 50 MG tablet Take 1 tablet by mouth at bedtime.    01/03/2016 at Unknown time  . metFORMIN (GLUCOPHAGE) 500 MG tablet Take 500 mg by mouth 2 (two) times daily.   01/04/2016 at Unknown time  . nitroGLYCERIN (NITROSTAT) 0.4 MG SL tablet Place 0.4 mg under the tongue every 5 (five) minutes x 3 doses as needed for chest pain.    prn at prn  . omeprazole (PRILOSEC) 20 MG capsule Take 20 mg by mouth 2 (two) times daily before a meal.    01/04/2016 at Unknown time  . PARoxetine (PAXIL) 20 MG tablet Take 60 mg by  mouth daily.    01/04/2016 at Unknown time  . tamsulosin (FLOMAX) 0.4 MG CAPS Take 0.4 mg by mouth daily.    01/04/2016 at Unknown time  . tiZANidine (ZANAFLEX) 4 MG tablet Take 4 mg by mouth every 8 (eight) hours as needed for muscle spasms.   prn at prn  . triamcinolone cream (KENALOG) 0.5 % Apply 1 application topically 2 (two) times daily as needed (rash).    prn at prn  . warfarin (COUMADIN) 5 MG tablet Take 1 tablet (5 mg total) by mouth daily. 30 tablet 11 01/03/2016 at Unknown time  . bisacodyl (DULCOLAX) 10 MG suppository Place 1 suppository (10 mg total) rectally daily as needed for moderate constipation. 12 suppository 0 PRN at Unknown time    Assessment: Patient was taking Coumadin 5mg  daily prior to admission for history of DVT.  6/30 INR 3.06  Goal of Therapy:  INR 2-3 Monitor platelets by anticoagulation protocol: Yes   Plan:  Will continue home dose of Coumadin 5mg  daily. Will check daily INR levels.  Paulina Fusi, PharmD, BCPS 01/05/2016 12:42 AM

## 2016-01-05 NOTE — Consult Note (Signed)
PULMONARY / CRITICAL CARE MEDICINE   Name: John Mendoza MRN: [id number] DOB: 1946/04/14    ADMISSION DATE:  01/04/2016   CONSULTATION DATE:  01/04/2016  REFERRING MD:  Dr. Lance Coon  CHIEF COMPLAINT:  Acute respiratory distress   HISTORY OF PRESENT ILLNESS:  This is a 70 y.o. WM with a PMH of CAD, parkinson's, OSA, COPD on home O2, T2DM, hypothyroidism and seizure disorder who presented to the ED with right/central chest pain on inspiration, fever and right upper quadrant abdominal pain that started suddenly in the morning of 6/30. Symptoms progressively got worse hence EMS was called. Upon EMS arrival, patient was hypoxic with O2 saturation in the 80s. He was placed on O2 via [place] and subsequently on a non-rebreather mask with significant improvement in symptoms. His CXR showed bibasilar opacities suggestive of pneumonia. He was admitted to the Hospitalist service and started on broad spectrum antibiotics and IV fluids.  Upon arrival on the floor, patient became hypoxic on O2 [place] with O2 saturation in the 40s and had increased tracheal congestion and frothy oral secretions. A rapid response was called and patient was suctioned, placed on BiPAP and transferred to the unit. PCCM was consulted for further management.     Patient has a h/o frequent recurrent aspiration pneumonia. He was recently treated for discitis and osteomyelitis at L4-5. Repeat MRI done 01/02/16 shows complete resolution He reports mild improvement in chest pain, sob and abdominal pain.   PAST MEDICAL HISTORY :  He  has a past medical history of Barrett esophagus; Hypertension; Hyperlipidemia; Multiple pulmonary nodules; CHF (congestive heart failure) ([place]); Lumbar spinal stenosis; COPD (chronic obstructive pulmonary disease) ([place]); CAD (coronary artery disease); On home oxygen therapy; DVT (deep venous thrombosis) ([place]) (11/2013; 02/01/2015); Parkinson's disease ([place]); Recurrent aspiration pneumonia ([place]); Sleep apnea;  Hypothyroidism; Type II diabetes mellitus ([place]); Iron deficiency anemia; GERD (gastroesophageal reflux disease); Seizures ([place]); DJD (degenerative joint disease); Arthritis; Chronic lower back pain; History of gout; Depression; Anxiety; Basal cell carcinoma; Throat cancer ([place]); Heart attack ([place]) (03/20/1995); and Goodpasture syndrome ([place]).  PAST SURGICAL HISTORY: He  has past surgical history that includes Anterior cervical decomp/discectomy fusion (X 2); Radical neck dissection (Right, ~ 1998); Total knee arthroplasty (Right, 2011); Thyroidectomy (~ 1996 X 2); Cataract extraction w/ intraocular lens  implant, bilateral (Bilateral); Tonsillectomy; Excisional hemorrhoidectomy; Joint replacement; Back surgery; Posterior fusion cervical spine (X 1); Coronary angioplasty with stent; Coronary artery bypass graft (1996); and Excision basal cell carcinoma.  Allergies  Allergen Reactions  . Other Other (See Comments)    Dizziness, lightheadedness, Pt states that inhaled medications make him depressed and have negative thoughts.      No current facility-administered medications on file prior to encounter.   Current Outpatient Prescriptions on File Prior to Encounter  Medication Sig  . acetaminophen (TYLENOL) 500 MG tablet Take 500 mg by mouth every 6 (six) hours as needed.  . ARIPiprazole (ABILIFY) 5 MG tablet Take 5 mg by mouth 2 (two) times daily.  . Artificial Tear Ointment (DRY EYES OP) Place 1 drop into both eyes daily as needed (dry eyes).  [place] Kitchen aspirin EC 81 MG tablet Take 81 mg by mouth at bedtime.   Marland Kitchen atorvastatin (LIPITOR) 40 MG tablet Take 40 mg by mouth at bedtime.  . bisoprolol (ZEBETA) 10 MG tablet Take 10 mg by mouth daily.  . carbidopa-levodopa (SINEMET IR) 25-250 MG tablet Take 2.5 tablets by mouth 3 (three) times daily.   . clonazePAM (KLONOPIN) 1 MG tablet Take 1  mg by mouth at bedtime.  . fentaNYL (DURAGESIC - DOSED MCG/HR) 50 MCG/HR Place 1 patch (50 mcg total) onto the skin every 3  (three) days.  Marland Kitchen HYDROcodone-acetaminophen (NORCO/VICODIN) 5-325 MG tablet Take 0.5 tablets by mouth every 8 (eight) hours as needed for moderate pain.   Marland Kitchen lamoTRIgine (LAMICTAL) 25 MG tablet Take 25 mg by mouth 2 (two) times daily.  Marland Kitchen levothyroxine (SYNTHROID, LEVOTHROID) 137 MCG tablet Take 137 mcg by mouth daily.  Marland Kitchen losartan (COZAAR) 50 MG tablet Take 1 tablet by mouth at bedtime.   . metFORMIN (GLUCOPHAGE) 500 MG tablet Take 500 mg by mouth 2 (two) times daily.  . nitroGLYCERIN (NITROSTAT) 0.4 MG SL tablet Place 0.4 mg under the tongue every 5 (five) minutes x 3 doses as needed for chest pain.   Mar[place] Kitchen omeprazole (PRILOSEC) 20 MG capsule Take 20 mg by mouth 2 (two) times daily before a meal.   . PARoxetine (PAXIL) 20 MG tablet Take 60 mg by mouth daily.   . tamsulosin (FLOMAX) 0.4 MG CAPS Take 0.4 mg by mouth daily.   [place] Kitchen tiZANidine (ZANAFLEX) 4 MG tablet Take 4 mg by mouth every 8 (eight) hours as needed for muscle spasms.  Mar[place] Kitchen triamcinolone cream (KENALOG) 0.5 % Apply 1 application topically 2 (two) times daily as needed (rash).   . warfarin (COUMADIN) 5 MG tablet Take 1 tablet (5 mg total) by mouth daily.  . bisacodyl (DULCOLAX) 10 MG suppository Place 1 suppository (10 mg total) rectally daily as needed for moderate constipation.    FAMILY HISTORY:  His has no family status information on file.   SOCIAL HISTORY: He  reports that he quit smoking about 20 years ago. His smoking use included Cigarettes. He has a 70 pack-year smoking history. He has never used smokeless tobacco. He reports that he drinks alcohol. He reports that he does not use illicit drugs.  REVIEW OF SYSTEMS:   Limited due to dyspnea with minimal exertion on continuous BiPAP. Constitutional:Positive for fever and chills.  HENT: Positive for tracheal congestion but negative for rhinorrhea.  Eyes: Negative for redness and visual disturbance.  Respiratory: Positive for shortness of breath but negative for wheezing.   Cardiovascular: Positive  for chest pain but negative for palpitations.  Gastrointestinal: Negative  for nausea , vomiting but positive for abdominal pain Genitourinary: Negative for dysuria and urgency.  Musculoskeletal: Negative for myalgias and arthralgias.  Skin: Negative for pallor and wound.  Neurological: Negative for dizziness and headaches but positive for tremors and generalized weakness  SUBJECTIVE:    VITAL SIGNS: BP 139/75 mmHg  Pulse 92  Temp(Src) 99.3 F (37.4 C) (Oral)  Resp 18  Ht 5\' 8"  (1.727 m)  Wt 159 lb (72.122 kg)  BMI 24.18 kg/m2  SpO2 96%  HEMODYNAMICS:    VENTILATOR SETTINGS:    INTAKE / OUTPUT:    PHYSICAL EXAMINATION: General: Acutely ill looking Neuro: AAO X3, speech is normal, moves all extremities, -4/5 grip strength bilaterally HEENT: PERRLA, oral mucosa pink, trachea midline Cardiovascular: RRR, S1/S2, no MRG Lungs: Increased WOB, bilateral airflow, diffuse rhonchi in anterior lung fields Abdomen: Non--distended, normal bowel sounds Musculoskeletal:  +rom, no joint deformities. Extremities: +2 pulses, no cyanosis Skin:  Warm and dry  LABS:  BMET  Recent Labs Lab 01/04/16 2003  NA 134*  K 4.0  CL 95*  CO2 30  BUN 22*  CREATININE 0.91  GLUCOSE 160*    Electrolytes  Recent Labs Lab 01/04/16 2003  CALCIUM 9.2  CBC  Recent Labs Lab 01/04/16 2003  WBC 6.0  HGB 11.6*  HCT 35.3*  PLT 196    Coag's  Recent Labs Lab 01/05/16 0001  INR 3.06    Sepsis Markers  Recent Labs Lab 01/04/16 2008 01/05/16 0001  LATICACIDVEN 2.3* 2.1*    ABG  Recent Labs Lab 01/05/16 0021  PHART 7.34*  PCO2ART 47  PO2ART 60*    Liver Enzymes  Recent Labs Lab 01/04/16 2003  AST 18  ALT 10*  ALKPHOS 69  BILITOT 0.7  ALBUMIN 3.7    Cardiac Enzymes  Recent Labs Lab 01/04/16 2003  TROPONINI 0.04*    Glucose No results for input(s): GLUCAP in the last 168 hours.  Imaging Dg Chest Port 1  View  01/04/2016  CLINICAL DATA:  71 year old male with chest pain EXAM: PORTABLE CHEST 1 VIEW COMPARISON:  Chest radiograph dated 12/11/2015 FINDINGS: Single portable view of chest demonstrates stable cardiomegaly with bibasilar centrally prominent opacities, likely congestive changes with possible superimposed pneumonia. There is no pleural effusion or pneumothorax. Median sternotomy wires and CABG vascular clips noted. No acute osseous pathology. IMPRESSION: Bibasilar opacities, likely congestive changes with possible pneumonia. Clinical correlation is recommend Electronically Signed   By: Anner Crete M.D.   On: 01/04/2016 20:24    STUDIES:  None  CULTURES: 0630/2017 Blood x2 Urine Sputum  ANTIBIOTICS: Vancomycin 06/30> Zosyn 06/30>  SIGNIFICANT EVENTS: 06/30: ED with chest pain, hypoxia and fever>admitted with sepsis, and pneumonia>Rapid response on the floor 2/2 acute pulmonary edema>ICU on continuous BiPAP.   LINES/TUBES: PIVs  DISCUSSION: 70 yo WM with multiple comorbidities admitted with pneumonia, and sepsis; now presenting with acute hypoxemic respiratory failure 2/2 pulmonary edema; necessitating continuous BiPAP  ASSESSMENT / PLAN:  PULMONARY A: Acute hypoxemic respiratory failure Bilateral lower lobe pneumonia Acute pulmonary edema Acute COPD exacerbation 2/2 PNA and pulmonary edema-Not on home inhalers but on Home O2 Recurrent Aspiration pneumonia OSA P:   -Continuous BiPAP with current settings -STAT ABG reviewed-FiO2 increased to 60% -CXR -Lasix 40 mg IV X1 -Continue broad spectrum antibiotics -Duoneb nebulizer Q4H prn for SOB/SOBE  CARDIOVASCULAR A:  Chest pain H/O CAD s/p MI P:  -Hemodynamics per ICU protocol -Cycle cardiac enzymes -Hold home antihypertensives 2/2 hypotension -Nitroglycerin prn for chest paiN -EKG-No acute ST changes  RENAL A:   No acute issues P:   -Monitor Is/Os -Monitor and replace  electrolytes  GASTROINTESTINAL A:   Abdominal pain-patient is not c/o anymore H/O GERD H/o DVT P:   -Monitor and continue home dose of PPI  HEMATOLOGIC A:   H/O Recurrent DVTs Basal cell carcinoma and throat cancer P:  -Continue coumadin -PT/INR daily -f/u with oncology prn  INFECTIOUS A:   Pneumonia and sepsis P:   -Empiric Abx -f/u cultures  ENDOCRINE A:   T2DM Hypothyroidism  P:   -POCT glucose testing with SSI coverage -Continue home dose of synthroid -Check TSH in am NEUROLOGIC A:   Parkinson's disease Seizure disorder H/O 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 Diet: Heart Healthy / Carb Mod. GI prophylaxis:  PPI.  VTE prophylaxis:  SCD's / already on long term coumadin therapy for DVTs   Magdalene S. [facility] ANP-BC Pulmonary and Critical Care Medicine [facility] Pager [phone] or [phone]  01/05/2016, 1:07 AM  Patient seen and examined with NP, agree with the assessment and plan. Auscultation  of the lungs. There is scattered rhonchi, bile fire is positive for Klebsiella and Enterobacter, review of chest x-ray films shows right middle lobe infiltrate, we'll continue antibiotics. Patient was taken off BiPAP and appeared to breathe better, without it. We'll maintain on nasal cannula oxygen. For high flow oxygen if needed. Marda Stalker M.D. 01/05/2016   Critical Care Attestation.  I have personally obtained a history, examined the patient, evaluated laboratory and imaging results, formulated the assessment and plan and placed orders. The Patient requires high complexity decision making for assessment and support, frequent evaluation and titration of therapies, application of advanced monitoring technologies and extensive interpretation of multiple databases. The patient has critical  illness that could lead imminently to failure of 1 or more organ systems and requires the highest level of physician preparedness to intervene.  Critical Care Time devoted to patient care services described in this note is 35 minutes and is exclusive of time spent in procedures.

## 2016-01-05 NOTE — Progress Notes (Signed)
Patient ID: CLELL SCHWADERER, male   DOB: May 16, 1946, 70 y.o.   MRN: [id number] [place] [facility] [place] NAME: Greer Gabehart    MR#:  [id number]  DATE OF BIRTH:  11-Apr-1946  SUBJECTIVE:  Patient came in again with recurrent symptoms of aspiration pneumonia. He presented with sepsis. Breath and hypoxia. He was on BiPAP now satting 94% on 4 L nasal, oxygen.  REVIEW OF SYSTEMS:   Review of Systems  Constitutional: Negative for fever, chills and weight loss.  HENT: Negative for ear discharge, ear pain and nosebleeds.   Eyes: Negative for blurred vision, pain and discharge.  Respiratory: Positive for cough and shortness of breath. Negative for sputum production, wheezing and stridor.   Cardiovascular: Negative for chest pain, palpitations, orthopnea and PND.  Gastrointestinal: Negative for nausea, vomiting, abdominal pain and diarrhea.  Genitourinary: Negative for urgency and frequency.  Musculoskeletal: Negative for back pain and joint pain.  Neurological: Positive for weakness. Negative for sensory change, speech change and focal weakness.  Psychiatric/Behavioral: Negative for depression and hallucinations. The patient is not nervous/anxious.   All other systems reviewed and are negative.  Tolerating Diet: Nothing by mouth Tolerating PT: Pending  DRUG ALLERGIES:   Allergies  Allergen Reactions  . Other Other (See Comments)    Dizziness, lightheadedness, Pt states that inhaled medications make him depressed and have negative thoughts.      VITALS:  Blood pressure 132/77, pulse 86, temperature 99.3 F (37.4 C), temperature source Axillary, resp. rate 28, height 5\' 8"  (1.727 m), weight 72.122 kg (159 lb), SpO2 93 %.  PHYSICAL EXAMINATION:   Physical Exam  GENERAL:  70 y.o.-year-old patient lying in the bed with no acute distress.  EYES: Pupils equal, round, reactive to light and accommodation. No scleral icterus. Extraocular muscles  intact.  HEENT: Head atraumatic, normocephalic. Oropharynx and nasopharynx clear.  NECK:  Supple, no jugular venous distention. No thyroid enlargement, no tenderness.  LUNGS: Coarse breath sounds bilaterally, no wheezing, rales, rhonchi. No use of accessory muscles of respiration.  CARDIOVASCULAR: S1, S2 normal. No murmurs, rubs, or gallops.  ABDOMEN: Soft, nontender, nondistended. Bowel sounds present. No organomegaly or mass.  EXTREMITIES: No cyanosis, clubbing or edema b/l.    NEUROLOGIC: Cranial nerves II through XII are intact. No focal Motor or sensory deficits b/l.   PSYCHIATRIC:  patient is alert and oriented x 3.  SKIN: No obvious rash, lesion, or ulcer.   LABORATORY PANEL:  CBC  Recent Labs Lab 01/05/16 0632  WBC 2.8*  HGB 10.3*  HCT 31.7*  PLT 157    Chemistries   Recent Labs Lab 01/04/16 2003 01/05/16 0632  NA 134* 135  K 4.0 4.9  CL 95* 99*  CO2 30 28  GLUCOSE 160* 106*  BUN 22* 24*  CREATININE 0.91 0.83  CALCIUM 9.2 8.2*  MG  --  1.1*  AST 18  --   ALT 10*  --   ALKPHOS 69  --   BILITOT 0.7  --    Cardiac Enzymes  Recent Labs Lab 01/05/16 1136  TROPONINI 0.07*   RADIOLOGY:  Dg Chest Port 1 View  01/04/2016  CLINICAL DATA:  70 year old male with chest pain EXAM: PORTABLE CHEST 1 VIEW COMPARISON:  Chest radiograph dated 12/11/2015 FINDINGS: Single portable view of chest demonstrates stable cardiomegaly with bibasilar centrally prominent opacities, likely congestive changes with possible superimposed pneumonia. There is no pleural effusion or pneumothorax. Median sternotomy wires and CABG vascular clips  noted. No acute osseous pathology. IMPRESSION: Bibasilar opacities, likely congestive changes with possible pneumonia. Clinical correlation is recommend Electronically Signed   By: Anner Crete M.D.   On: 01/04/2016 20:24   ASSESSMENT AND PLAN:  Nino Strube is a 70 y.o. male who presents with Sepsis from pneumonia, likely aspiration pneumonia.  Patient has a known history of Parkinson's and has had aspiration events in the past. He states that for the past couple days he has started feeling worse. He specifically complains of pleuritic chest pain with deep breathing. His wife states that he had episodes of chills with fever at home earlier in the day today. On arrival to the ED he was hypoxic on room air with O2 sats in the mid 80s  1. Sepsis ([place]) - hemodynamically stable -Came in with tachycardia hypoxia chest x-ray evident with aspiration pneumonia and elevated lactic acid  -1 out of 2 blood culture positive for Klebsiella pneumoniae -Discussed antibiotic choice with pharmacy and will keep patient on Unasyn since its aspiration to cover anaerobes as well -When necessary BiPAP. Sats stable.  2. Recurrent Aspiration pneumonia ([place]) - IV antibiotics and cultures as above, add sputum culture -Seen by speech therapy. Patient has had a PEG tube placement in the remote past. He was removed 6 months ago. He had episode of aspiration pneumonia early part of June this is a second aspiration and highly recommend patient go back on PEG tube which he is agreeable to it now and matter of fact he is requesting get -We'll get GI consultation when consultation services available next for PEG tube placement  3 Hypertension - currently normotensive, hold home antihypertensives for now  4 CAD (coronary artery disease) - continue home meds   5 Parkinsons disease ([place]) - continue home meds   6 Hypothyroidism - continue home dose thyroid replacement   CODE STATUS: full  DVT Prophylaxis: lovenox  TOTAL TIME TAKING CARE OF THIS PATIENT: 30 minutes.  >50% time spent on counselling and coordination of care  POSSIBLE D/C IN 1-2 DAYS, DEPENDING ON CLINICAL CONDITION.  Note: This dictation was prepared with Dragon dictation along with smaller phrase technology. Any transcriptional errors that result from this process are  unintentional.  Myrla Malanowski M.D on 01/05/2016 at 12:24 PM  Between 7am to 6pm - Pager - [phone]  After 6pm go to [email or web address] - password [email or web address] [facility]  [place] [facility]  Office  [phone]  CC: Primary care physician; Adin Hector, MD

## 2016-01-05 NOTE — Progress Notes (Signed)
eLink Physician-Brief Progress Note Patient Name: FAHEEM CLYDE DOB: Apr 15, 1946 MRN: [id number]   Date of Service  01/05/2016  HPI/Events of Note  59 M with parkinson disease and h/o of aspiration presenting with s/s of sepsis and resp distress.  Patient was initially admitted to the floor but dropped sats into the high 80s.  Placed on NRB and transferred to ICU.  Is on [place] Spec ABX.  HD stable but tachy.  Sats of 92% on BiPAP with RR in the high 30s to low 40s.  Camera check shows the patient on BiPAP with elevated RR no overt use of accessory muscles.  TV in the 400 to 600 range.  PCXR shows bilateral opacifications concern for PNA +/- vascular congestion.  Lactate up at 2.3 with normal WBC.  eICU Interventions  F/U on ABG results Low threshold for PCCM involvement Continue BiPAP for now     Intervention Category Evaluation Type: New Patient Evaluation  Itzael Liptak 01/05/2016, 12:25 AM

## 2016-01-05 NOTE — Progress Notes (Signed)
Pt was about to be admitted to the unit from ED when pt looked pale, sounded wet in his chest, and reported unbearable pain. Placed pt on cardiac monitor and recheck pt oxygen saturation. Pt O2 was 42%. A rapid was called and pink tinged sputum coming from his mouth. Pt was suctioned and the nursing supervisor reported that we are transferring pt to the unit ASAP. Pt was placed on a BiPAP in the elevator on route to the critical care unit. Pt was placed in room 12, report given to nurse and spouse Neylan Landfair was informed pt was transferred to [facility].

## 2016-01-06 ENCOUNTER — Inpatient Hospital Stay
Admit: 2016-01-06 | Discharge: 2016-01-06 | Disposition: A | Discharge status: Home / Self Care | Payer: Medicare Other | Attending: Adult Health | Admitting: Adult Health

## 2016-01-06 ENCOUNTER — Inpatient Hospital Stay: Payer: Medicare Other

## 2016-01-06 LAB — CBC
HCT: 29.4 % — ABNORMAL LOW (ref 40.0–52.0)
Hemoglobin: 9.7 g/dL — ABNORMAL LOW (ref 13.0–18.0)
MCH: 26.6 pg (ref 26.0–34.0)
MCHC: 32.9 g/dL (ref 32.0–36.0)
MCV: 80.6 fL (ref 80.0–100.0)
PLATELETS: 144 10*3/uL — AB (ref 150–440)
RBC: 3.65 MIL/uL — ABNORMAL LOW (ref 4.40–5.90)
RDW: 18.3 % — AB (ref 11.5–14.5)
WBC: 9.2 10*3/uL (ref 3.8–10.6)

## 2016-01-06 LAB — BASIC METABOLIC PANEL
ANION GAP: 6 (ref 5–15)
BUN: 27 mg/dL — AB (ref 6–20)
CALCIUM: 8.1 mg/dL — AB (ref 8.9–10.3)
CO2: 27 mmol/L (ref 22–32)
CREATININE: 0.75 mg/dL (ref 0.61–1.24)
Chloride: 101 mmol/L (ref 101–111)
GFR calc Af Amer: >60 mL/min (ref >60)
GLUCOSE: 119 mg/dL — AB (ref 65–99)
Potassium: 3.9 mmol/L (ref 3.5–5.1)
Sodium: 134 mmol/L — ABNORMAL LOW (ref 135–145)

## 2016-01-06 LAB — GLUCOSE, CAPILLARY
GLUCOSE-CAPILLARY: 102 mg/dL — AB (ref 65–99)
GLUCOSE-CAPILLARY: 103 mg/dL — AB (ref 65–99)
GLUCOSE-CAPILLARY: 108 mg/dL — AB (ref 65–99)
Glucose-Capillary: 104 mg/dL — ABNORMAL HIGH (ref 65–99)
Glucose-Capillary: 127 mg/dL — ABNORMAL HIGH (ref 65–99)

## 2016-01-06 LAB — URINE CULTURE: CULTURE: NO GROWTH

## 2016-01-06 LAB — PROTIME-INR
INR: 4.4
Prothrombin Time: 40.8 seconds — ABNORMAL HIGH (ref 11.4–15.0)

## 2016-01-06 LAB — TROPONIN I
TROPONIN I: 0.03 ng/mL — AB (ref <0.03)
TROPONIN I: 0.04 ng/mL — AB (ref <0.03)
TROPONIN I: 0.04 ng/mL — AB (ref <0.03)

## 2016-01-06 MED ORDER — AMIODARONE IV BOLUS ONLY 150 MG/100ML: 150.0000 mg | Freq: Once | INTRAVENOUS | Status: DC

## 2016-01-06 MED ORDER — LEVOTHYROXINE SODIUM 137 MCG PO TABS
137.0000 ug | ORAL_TABLET | Freq: Every day | ORAL | Status: DC
  Administered 2016-01-06 – 2016-01-11 (×5): 137 ug via ORAL
  Filled 2016-01-06 (×5): qty 1

## 2016-01-06 MED ORDER — HYDROCORTISONE NA SUCCINATE PF 100 MG IJ SOLR
50.0000 mg | Freq: Every day | INTRAMUSCULAR | Status: DC
  Administered 2016-01-07 – 2016-01-09 (×3): 50 mg via INTRAVENOUS
  Filled 2016-01-06: qty 1
  Filled 2016-01-06 (×2): qty 2

## 2016-01-06 MED ORDER — METOPROLOL TARTRATE 25 MG PO TABS
25.0000 mg | ORAL_TABLET | Freq: Two times a day (BID) | ORAL | Status: DC
Start: 2016-01-06 — End: 2016-01-11
  Administered 2016-01-06 – 2016-01-11 (×10): 25 mg via ORAL
  Filled 2016-01-06 (×10): qty 1

## 2016-01-06 MED ORDER — AMIODARONE IV BOLUS ONLY 150 MG/100ML
150.0000 mg | Freq: Once | INTRAVENOUS | Status: DC
Start: 2016-01-06 — End: 2016-01-06

## 2016-01-06 MED ORDER — AMIODARONE IV BOLUS ONLY 150 MG/100ML
150.0000 mg | Freq: Once | INTRAVENOUS | Status: AC
  Administered 2016-01-06: 150 mg via INTRAVENOUS
  Filled 2016-01-06: qty 100

## 2016-01-06 NOTE — Progress Notes (Signed)
Dr. Juanell Fairly notified of patients troponin and reviewed 12 lead ekg. At this point no new orders given.

## 2016-01-06 NOTE — Progress Notes (Signed)
Patient admitted from the CCU. Alert and oriented.  States his pain is at 8. Usually a 5.  Fentanyl patch on his left shoulder. Applied on 7/1. States he needs to other meds. O2 at 3 L [place].

## 2016-01-06 NOTE — Progress Notes (Signed)
Warfarin held per pharmacy. INR 4.4

## 2016-01-06 NOTE — Progress Notes (Signed)
ANTICOAGULATION CONSULT NOTE -Follow up [facility] for Warfarin Dosing Indication: DVT  Allergies  Allergen Reactions  . Other Other (See Comments)    Dizziness, lightheadedness, Pt states that inhaled medications make him depressed and have negative thoughts.      Patient Measurements: Height: 5\' 8"  (172.7 cm) Weight: 159 lb (72.122 kg) IBW/kg (Calculated) : 68.4 Heparin Dosing Weight: na  Vital Signs: Temp: 98 F (36.7 C) (07/02 0700) Temp Source: Oral (07/02 0100) BP: 117/99 mmHg (07/02 0700) Pulse Rate: 123 (07/02 0700)  Labs:  Recent Labs  01/04/16 2003 01/05/16 0001 01/05/16 0632 01/05/16 1136 01/06/16 0429  HGB 11.6*  --  10.3*  --  9.7*  HCT 35.3*  --  31.7*  --  29.4*  PLT 196  --  157  --  144*  LABPROT  --  31.1* 32.1*  --  40.8*  INR  --  3.06 3.20  --  4.40*  CREATININE 0.91  --  0.83  --  0.75  TROPONINI 0.04*  --  0.11* 0.07*  --     Estimated Creatinine Clearance: 83.1 mL/min (by C-G formula based on Cr of 0.75).   Medical History: Past Medical History  Diagnosis Date  . Barrett esophagus   . Hypertension   . Hyperlipidemia   . Multiple pulmonary nodules   . CHF (congestive heart failure) ([place])   . Lumbar spinal stenosis   . COPD (chronic obstructive pulmonary disease) ([place])   . CAD (coronary artery disease)     on 2l home oxygen  . On home oxygen therapy   . DVT (deep venous thrombosis) ([place]) 11/2013; 02/01/2015    on coumadin  . Parkinson's disease ([place])   . Recurrent aspiration pneumonia ([place])   . Sleep apnea   . Hypothyroidism   . Type II diabetes mellitus ([place])   . Iron deficiency anemia   . GERD (gastroesophageal reflux disease)   . Seizures ([place])   . DJD (degenerative joint disease)   . Arthritis   . Chronic lower back pain   . History of gout   . Depression   . Anxiety   . Basal cell carcinoma   . Throat cancer ([place])   . Heart attack ([place]) 03/20/1995  . 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 acetaminophen (TYLENOL) 500 MG tablet Take 500 mg by mouth every 6 (six) hours as needed.   prn at prn  . ARIPiprazole (ABILIFY) 5 MG tablet Take 5 mg by mouth 2 (two) times daily.   01/04/2016 at Unknown time  . Artificial Tear Ointment (DRY EYES OP) Place 1 drop into both eyes daily as needed (dry eyes).   prn at prn  . aspirin EC 81 MG tablet Take 81 mg by mouth at bedtime.    01/03/2016 at Unknown time  . atorvastatin (LIPITOR) 40 MG tablet Take 40 mg by mouth at bedtime.   01/03/2016 at Unknown time  . bisoprolol (ZEBETA) 10 MG tablet Take 10 mg by mouth daily.   01/04/2016 at Unknown time  . carbidopa-levodopa (SINEMET IR) 25-250 MG tablet Take 2.5 tablets by mouth 3 (three) times daily.    01/04/2016 at Unknown time  . clonazePAM (KLONOPIN) 1 MG tablet Take 1 mg by mouth at bedtime.   01/03/2016 at Unknown time  . fentaNYL (DURAGESIC - DOSED MCG/HR)  50 MCG/HR Place 1 patch (50 mcg total) onto the skin every 3 (three) days. 10 patch 0 01/02/2016  . HYDROcodone-acetaminophen (NORCO/VICODIN) 5-325 MG tablet Take 0.5 tablets by mouth every 8 (eight) hours as needed for moderate pain.    prn at prn  . lamoTRIgine (LAMICTAL) 25 MG tablet Take 25 mg by mouth 2 (two) times daily.   01/04/2016 at Unknown time  . levothyroxine (SYNTHROID, LEVOTHROID) 137 MCG tablet Take 137 mcg by mouth daily.   01/04/2016 at Unknown time  . losartan (COZAAR) 25 MG tablet Take 25 mg by mouth at bedtime.   01/03/2016 at Unknown time  . losartan (COZAAR) 50 MG tablet Take 1 tablet by mouth at bedtime.    01/03/2016 at Unknown time  . metFORMIN (GLUCOPHAGE) 500 MG tablet Take 500 mg by mouth 2 (two) times daily.   01/04/2016 at Unknown time  . nitroGLYCERIN (NITROSTAT) 0.4 MG SL tablet Place 0.4 mg under the tongue every 5 (five) minutes x 3 doses as needed for chest pain.    prn at prn  . omeprazole  (PRILOSEC) 20 MG capsule Take 20 mg by mouth 2 (two) times daily before a meal.    01/04/2016 at Unknown time  . PARoxetine (PAXIL) 20 MG tablet Take 60 mg by mouth daily.    01/04/2016 at Unknown time  . tamsulosin (FLOMAX) 0.4 MG CAPS Take 0.4 mg by mouth daily.    01/04/2016 at Unknown time  . tiZANidine (ZANAFLEX) 4 MG tablet Take 4 mg by mouth every 8 (eight) hours as needed for muscle spasms.   prn at prn  . triamcinolone cream (KENALOG) 0.5 % Apply 1 application topically 2 (two) times daily as needed (rash).    prn at prn  . warfarin (COUMADIN) 5 MG tablet Take 1 tablet (5 mg total) by mouth daily. 30 tablet 11 01/03/2016 at Unknown time  . bisacodyl (DULCOLAX) 10 MG suppository Place 1 suppository (10 mg total) rectally daily as needed for moderate constipation. 12 suppository 0 PRN at Unknown time    Assessment: Patient was taking Coumadin 5mg  daily prior to admission for history of DVT.  6/30 INR 3.06  None charted/ordered 7/1   INR 3.20  Hold dose 7/2   INR 4.40  Hold dose  Goal of Therapy:  INR 2-3 Monitor platelets by anticoagulation protocol: Yes   Plan:  Will hold Coumadin dose today. F/u with am labs. Patient on Vancomycin/zosyn. Will check daily INR levels.  7/2- INR=4.40. Continue to hold Coumadin dose.  Patient on Unasyn. F/u INR in am.   Chinita Greenland PharmD Clinical Pharmacist 01/06/2016  9:12 AM

## 2016-01-06 NOTE — Progress Notes (Signed)
Spoke with Dr Posey Pronto regarding patients heart rate and fluctuating rhythm. Scheduled metoprolol ordered last night. No new orders given.

## 2016-01-06 NOTE — Progress Notes (Signed)
Patient alert and oriented. Tolerating his diet and using urinal. No complaints of shortness of breath. Awaiting for GI consult.

## 2016-01-06 NOTE — Progress Notes (Signed)
Patient ID: ROSELL FASOLINO, male   DOB: 1946/01/06, 70 y.o.   MRN: [id number] [place] [facility] [place] NAME: Ervil Lainhart    MR#:  [id number]  DATE OF BIRTH:  May 11, 1946  SUBJECTIVE:  Patient came in again with recurrent symptoms of aspiration pneumonia. He presented with sepsis. Breath and hypoxia. He was on BiPAP now satting 94% on 4 L nasal, oxygen. Tachycardia with NSVT. Received loading dose of IV amiodarone. HR in the 100's REVIEW OF SYSTEMS:   Review of Systems  Constitutional: Negative for fever, chills and weight loss.  HENT: Negative for ear discharge, ear pain and nosebleeds.   Eyes: Negative for blurred vision, pain and discharge.  Respiratory: Positive for cough and shortness of breath. Negative for sputum production, wheezing and stridor.   Cardiovascular: Negative for chest pain, palpitations, orthopnea and PND.  Gastrointestinal: Negative for nausea, vomiting, abdominal pain and diarrhea.  Genitourinary: Negative for urgency and frequency.  Musculoskeletal: Negative for back pain and joint pain.  Neurological: Positive for weakness. Negative for sensory change, speech change and focal weakness.  Psychiatric/Behavioral: Negative for depression and hallucinations. The patient is not nervous/anxious.   All other systems reviewed and are negative.  Tolerating Diet: Nothing by mouth Tolerating PT: Pending  DRUG ALLERGIES:   Allergies  Allergen Reactions  . Other Other (See Comments)    Dizziness, lightheadedness, Pt states that inhaled medications make him depressed and have negative thoughts.      VITALS:  Blood pressure 117/99, pulse 123, temperature 98 F (36.7 C), temperature source Oral, resp. rate 24, height 5\' 8"  (1.727 m), weight 72.122 kg (159 lb), SpO2 96 %.  PHYSICAL EXAMINATION:   Physical Exam  GENERAL:  70 y.o.-year-old patient lying in the bed with no acute distress.  EYES: Pupils equal, round, reactive  to light and accommodation. No scleral icterus. Extraocular muscles intact.  HEENT: Head atraumatic, normocephalic. Oropharynx and nasopharynx clear.  NECK:  Supple, no jugular venous distention. No thyroid enlargement, no tenderness.  LUNGS: Coarse breath sounds bilaterally, no wheezing, rales, rhonchi. No use of accessory muscles of respiration.  CARDIOVASCULAR: S1, S2 normal. No murmurs, rubs, or gallops.  ABDOMEN: Soft, nontender, nondistended. Bowel sounds present. No organomegaly or mass.  EXTREMITIES: No cyanosis, clubbing or edema b/l.    NEUROLOGIC: Cranial nerves II through XII are intact. No focal Motor or sensory deficits b/l.   PSYCHIATRIC:  patient is alert and oriented x 3.  SKIN: No obvious rash, lesion, or ulcer.   LABORATORY PANEL:  CBC  Recent Labs Lab 01/06/16 0429  WBC 9.2  HGB 9.7*  HCT 29.4*  PLT 144*    Chemistries   Recent Labs Lab 01/04/16 2003 01/05/16 0632 01/06/16 0429  NA 134* 135 134*  K 4.0 4.9 3.9  CL 95* 99* 101  CO2 30 28 27   GLUCOSE 160* 106* 119*  BUN 22* 24* 27*  CREATININE 0.91 0.83 0.75  CALCIUM 9.2 8.2* 8.1*  MG  --  1.1*  --   AST 18  --   --   ALT 10*  --   --   ALKPHOS 69  --   --   BILITOT 0.7  --   --    Cardiac Enzymes  Recent Labs Lab 01/06/16 0856  TROPONINI 0.03*   RADIOLOGY:  Dg Chest 1 View  01/06/2016  CLINICAL DATA:  Shortness of Breath EXAM: CHEST 1 VIEW COMPARISON:  01/04/2016 FINDINGS: Cardiac shadow is enlarged  but stable. Postsurgical changes are noted. Bibasilar atelectatic changes are again seen with small pleural effusions bilaterally. No new focal abnormality is noted. IMPRESSION: Bibasilar changes stable from the prior exam. Electronically Signed   By: Inez Catalina M.D.   On: 01/06/2016 07:41   Dg Chest Port 1 View  01/04/2016  CLINICAL DATA:  70 year old male with chest pain EXAM: PORTABLE CHEST 1 VIEW COMPARISON:  Chest radiograph dated 12/11/2015 FINDINGS: Single portable view of chest  demonstrates stable cardiomegaly with bibasilar centrally prominent opacities, likely congestive changes with possible superimposed pneumonia. There is no pleural effusion or pneumothorax. Median sternotomy wires and CABG vascular clips noted. No acute osseous pathology. IMPRESSION: Bibasilar opacities, likely congestive changes with possible pneumonia. Clinical correlation is recommend Electronically Signed   By: Anner Crete M.D.   On: 01/04/2016 20:24   ASSESSMENT AND PLAN:  Rance Friedland is a 70 y.o. male who presents with Sepsis from pneumonia, likely aspiration pneumonia. Patient has a known history of Parkinson's and has had aspiration events in the past. He states that for the past couple days he has started feeling worse. He specifically complains of pleuritic chest pain with deep breathing. His wife states that he had episodes of chills with fever at home earlier in the day today. On arrival to the ED he was hypoxic on room air with O2 sats in the mid 80s  1. Sepsis ([place]) - hemodynamically stable -Came in with tachycardia hypoxia chest x-ray evident with aspiration pneumonia and elevated lactic acid  -1 out of 2 blood culture positive for Klebsiella pneumoniae -Discussed antibiotic choice with pharmacy and will keep patient on Unasyn since its aspiration to cover anaerobes as well -When necessary BiPAP. Sats stable.  2. Recurrent Aspiration pneumonia ([place]) - IV antibiotics and cultures as above, add sputum culture -Seen by speech therapy. Patient has had a PEG tube placement in the remote past. He was removed 6 months ago. He had episode of aspiration pneumonia early part of June this is a second aspiration and highly recommend patient go back on PEG tube which he is agreeable to it now and matter of fact he is requesting get -We'll get GI consultation when consultation services available next for PEG tube placement  3 Hypertension - currently normotensive, hold home antihypertensives  for now  4 CAD (coronary artery disease) - continue home meds   5 Parkinsons disease ([place]) - continue home meds   6 Hypothyroidism - continue home dose thyroid replacement  Transfer to telemetry  CODE STATUS: full  DVT Prophylaxis: lovenox  TOTAL TIME TAKING CARE OF THIS PATIENT: 30 minutes.  >50% time spent on counselling and coordination of care  POSSIBLE D/C IN 1-2 DAYS, DEPENDING ON CLINICAL CONDITION.  Note: This dictation was prepared with Dragon dictation along with smaller phrase technology. Any transcriptional errors that result from this process are unintentional.  Avanthika Dehnert M.D on 01/06/2016 at 9:46 AM  Between 7am to 6pm - Pager - [phone]  After 6pm go to [email or web address] - password [email or web address] [facility]  [place] [facility]  Office  [phone]  CC: Primary care physician; Adin Hector, MD

## 2016-01-06 NOTE — Progress Notes (Signed)
*  PRELIMINARY RESULTS* Echocardiogram 2D Echocardiogram has been performed.  John Mendoza 01/06/2016, 12:23 PM

## 2016-01-06 NOTE — Progress Notes (Signed)
PULMONARY / CRITICAL CARE MEDICINE   Name: John Mendoza MRN: [id number] DOB: [birth year]    ADMISSION DATE:  01/04/2016   CONSULTATION DATE:  01/04/2016  REFERRING MD:  Dr. Lance Coon  CHIEF COMPLAINT:  Acute respiratory distress   Brief interim history:   This is a 70 y.o. WM with a PMH of CAD, parkinson's, OSA, COPD on home O2, T2DM, hypothyroidism and seizure disorder who presented to the ED with right/central chest pain on inspiration, fever and right upper quadrant abdominal pain that started suddenly in the morning of 6/30. Symptoms progressively got worse hence EMS was called. Upon EMS arrival, John Mendoza was hypoxic with O2 saturation in the 80s. He was placed on O2 via [place] and subsequently on a non-rebreather mask with significant improvement in symptoms. His CXR showed bibasilar opacities suggestive of pneumonia. He was admitted to the Hospitalist service and started on broad spectrum antibiotics and IV fluids.  Upon arrival on the floor, John Mendoza became hypoxic on O2 [place] with O2 saturation in the 40s and had increased tracheal congestion and frothy oral secretions. A rapid response was called and John Mendoza was suctioned, placed on BiPAP and transferred to the unit. PCCM was consulted for further management.     John Mendoza has a h/o frequent recurrent aspiration pneumonia. He was recently treated for discitis and osteomyelitis at L4-5. Repeat MRI done 01/02/16 shows complete resolution He reports mild improvement in chest pain, sob and abdominal pain.  On AM of 7/2; pt went into a flutter, given amio bolus.    SUBJECTIVE: Reports feeling better. Titrated off BiPAP and tolerating [place]. Had episode of paroxysmal SVT this morning; HR improved with amiodarone bolus and metoprolol.    VITAL SIGNS: BP 117/99 mmHg  Pulse 123  Temp(Src) 98 F (36.7 C) (Oral)  Resp 24  Ht 5\' 8"  (1.727 m)  Wt 159 lb (72.122 kg)  BMI 24.18 kg/m2  SpO2 96%  HEMODYNAMICS:    VENTILATOR SETTINGS:    INTAKE /  OUTPUT: I/O last 3 completed shifts: In: 692.4 [I.V.:492.4; IV [id number] Out: [id number]  PHYSICAL EXAMINATION: General: Acutely ill looking Neuro: AAO X3, speech is normal, moves all extremities, -4/5 grip strength bilaterally HEENT: PERRLA, oral mucosa pink, trachea midline Cardiovascular: hycardia with HR in the 120s , S1/S2, no MRG Lungs: normal WOB, bilateral airflow, no wheezes or rhonchi Abdomen: Non--distended, normal bowel sounds Musculoskeletal:  +rom, no joint deformities. Extremities: +2 pulses, no cyanosis Skin:  Warm and dry  LABS:  BMET  Recent Labs Lab 01/04/16 2003 01/05/16 0632 01/06/16 0429  NA 134* 135 134*  K 4.0 4.9 3.9  CL 95* 99* 101  CO2 30 28 27   BUN 22* 24* 27*  CREATININE 0.91 0.83 0.75  GLUCOSE 160* 106* 119*    Electrolytes  Recent Labs Lab 01/04/16 2003 01/05/16 0632 01/06/16 0429  CALCIUM 9.2 8.2* 8.1*  MG  --  1.1*  --   PHOS  --  3.2  --     CBC  Recent Labs Lab 01/04/16 2003 01/05/16 0632 01/06/16 0429  WBC 6.0 2.8* 9.2  HGB 11.6* 10.3* 9.7*  HCT 35.3* 31.7* 29.4*  PLT 196 157 144*    Coag's  Recent Labs Lab 01/05/16 0001 01/05/16 0632 01/06/16 0429  INR 3.06 3.20 4.40*    Sepsis Markers  Recent Labs Lab 01/04/16 2008 01/05/16 0001  LATICACIDVEN 2.3* 2.1*    ABG  Recent Labs Lab 01/05/16 0021  PHART 7.34*  PCO2ART 47  PO2ART 60*  Liver Enzymes  Recent Labs Lab 01/04/16 2003  AST 18  ALT 10*  ALKPHOS 69  BILITOT 0.7  ALBUMIN 3.7    Cardiac Enzymes  Recent Labs Lab 01/04/16 2003 01/05/16 0632 01/05/16 1136  TROPONINI 0.04* 0.11* 0.07*    Glucose  Recent Labs Lab 01/05/16 1546 01/05/16 1941 01/05/16 2340 01/06/16 0335 01/06/16 0704  GLUCAP 91 123* 116* 104* 127*    Imaging Dg Chest 1 View  01/06/2016  CLINICAL DATA:  Shortness of Breath EXAM: CHEST 1 VIEW COMPARISON:  01/04/2016 FINDINGS: Cardiac shadow is enlarged but stable. Postsurgical changes  are noted. Bibasilar atelectatic changes are again seen with small pleural effusions bilaterally. No new focal abnormality is noted. IMPRESSION: Bibasilar changes stable from the prior exam. Electronically Signed   By: Inez Catalina M.D.   On: 01/06/2016 07:41    STUDIES:  None  CULTURES: 0630/2017 Blood x2 Urine Sputum  ANTIBIOTICS: Vancomycin 06/30> Zosyn 06/30>  SIGNIFICANT EVENTS: 06/30: ED with chest pain, hypoxia and fever>admitted with sepsis, and pneumonia>Rapid response on the floor 2/2 acute pulmonary edema>ICU on continuous BiPAP.   LINES/TUBES: PIVs  DISCUSSION: 71 yo WM with Parkinson's disease with dysphagia, aspiration pneumonia in the past,  multiple comorbidities admitted with pneumonia, and sepsis; now presenting with acute hypoxemic respiratory failure 2/2 pulmonary edema; necessitating continuous BiPAP  ASSESSMENT / PLAN:  PULMONARY A: Acute hypoxemic respiratory failure Bilateral lower lobe pneumonia Acute pulmonary edema Acute COPD exacerbation 2/2 PNA and pulmonary edema-Not on home inhalers but on Home O2 Recurrent Aspiration pneumonia OSA History of [place]'s syndrome for which he was on prednisone outpatient in the past.  History of DVT, for which he was on coumadin.   P:   -Continue supplemental O2 [place] -CXR ain am -Continue broad spectrum antibiotics -Duoneb nebulizer Q4H prn for SOB/SOBE  CARDIOVASCULAR A:  New onset SVT Chest pain-trop unremarkable, peaked at 0.11 H/O CAD s/p MI   P:  -Hemodynamics per ICU protocol -Cycle cardiac enzymes -Hold home antihypertensives 2/2 hypotension -Nitroglycerin prn for chest pain -EKG and 2 D ECHO  RENAL A:   No acute issues P:   -Monitor Is/Os -Monitor and replace electrolytes  GASTROINTESTINAL A:   Abdominal pain-John Mendoza is not c/o anymore H/O GERD H/o DVT P:   -Monitor and continue home dose of PPI  HEMATOLOGIC A:   H/O Recurrent DVTs-Supratherapeutic INR; John Mendoza was on  Levaquin prior to admission Basal cell carcinoma and throat cancer P:  -Hold coumadin -PT/INR daily -f/u with oncology prn  INFECTIOUS A:   Pneumonia and sepsis P:   -Empiric Abx -f/u cultures  ENDOCRINE A:   T2DM Hypothyroidism  P:   -POCT glucose testing with SSI coverage -Continue home dose of synthroid  NEUROLOGIC A:   Parkinson's disease Seizure disorder H/O 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 Diet: Heart Healthy / Carb Mod. GI prophylaxis:  PPI.  VTE prophylaxis:  SCD's / already on long term coumadin therapy for DVTs   Magdalene S. [facility] ANP-BC Pulmonary and Critical Care Medicine [facility] Pager [phone] or [phone]  01/06/2016, 8:39 AM  Pt seen and examined with NP, agree with assessment and plan. History of parkinson's with h/o dysphagia in past, h/o PEG which was discontinued in past. Now if dysphagia and aspiration pneumonia. Pt doing significantly better, scattered creps but better than previous days on auscultation. He continues  to be NPO, will awake swallow eval, continue abx.   He developed aflutter this am, and was given dose of amio, he is now on metroprolol, will monitor HR.  He has a history of [place]'s per history was on steroids, but I do not see this on his med list.  He has a history of DVT, and has been on coumadin.   Marda Stalker, M.D.  01/06/2016   Critical Care Attestation.  I have personally obtained a history, examined the John Mendoza, evaluated laboratory and imaging results, formulated the assessment and plan and placed orders. The John Mendoza requires high complexity decision making for assessment and support, frequent evaluation and titration of therapies, application of advanced monitoring technologies and extensive interpretation of multiple databases. The  John Mendoza has critical illness that could lead imminently to failure of 1 or more organ systems and requires the highest level of physician preparedness to intervene.  Critical Care Time devoted to John Mendoza care services described in this note is 35 minutes and is exclusive of time spent in procedures.

## 2016-01-07 ENCOUNTER — Inpatient Hospital Stay: Payer: Medicare Other

## 2016-01-07 LAB — ECHOCARDIOGRAM COMPLETE
AV Area VTI: 3.09 cm2
AV Peak grad: 5 mmHg
AVPKVEL: 111 cm/s
Ao pk vel: 0.81 m/s
Ao-asc: 33 cm
CHL CUP AV PEAK INDEX: 1.67
CHL CUP PV REG GRAD DIAS: 14 mmHg
FS: 23 % — AB (ref 28–44)
Height: 68 in
IVS/LV PW RATIO, ED: 0.86
LA ID, A-P, ES: 51 mm
LA vol: 95.6 mL
LADIAMINDEX: 2.76 cm/m2
LAVOLA4C: 85.3 mL
LAVOLIN: 51.7 mL/m2
LEFT ATRIUM END SYS DIAM: 51 mm
LV PW d: 11.8 mm — AB (ref 0.6–1.1)
LVOT area: 3.8 cm2
LVOT diameter: 22 mm
LVOTPV: 90.2 cm/s
PV Reg vel dias: 187 cm/s
TVMG: 254 mmHg
Weight: 2544 oz

## 2016-01-07 LAB — BASIC METABOLIC PANEL
ANION GAP: 3 — AB (ref 5–15)
BUN: 21 mg/dL — ABNORMAL HIGH (ref 6–20)
CALCIUM: 8 mg/dL — AB (ref 8.9–10.3)
CO2: 31 mmol/L (ref 22–32)
Chloride: 102 mmol/L (ref 101–111)
Creatinine, Ser: 0.6 mg/dL — ABNORMAL LOW (ref 0.61–1.24)
Glucose, Bld: 100 mg/dL — ABNORMAL HIGH (ref 65–99)
POTASSIUM: 3.8 mmol/L (ref 3.5–5.1)
Sodium: 136 mmol/L (ref 135–145)

## 2016-01-07 LAB — GLUCOSE, CAPILLARY
GLUCOSE-CAPILLARY: 108 mg/dL — AB (ref 65–99)
GLUCOSE-CAPILLARY: 91 mg/dL (ref 65–99)
GLUCOSE-CAPILLARY: 92 mg/dL (ref 65–99)
GLUCOSE-CAPILLARY: 103 mg/dL — AB (ref 65–99)
Glucose-Capillary: 173 mg/dL — ABNORMAL HIGH (ref 65–99)
Glucose-Capillary: 106 mg/dL — ABNORMAL HIGH (ref 65–99)

## 2016-01-07 LAB — CBC
HEMATOCRIT: 28.6 % — AB (ref 40.0–52.0)
Hemoglobin: 9.5 g/dL — ABNORMAL LOW (ref 13.0–18.0)
MCH: 26.7 pg (ref 26.0–34.0)
MCHC: 33.2 g/dL (ref 32.0–36.0)
MCV: 80.2 fL (ref 80.0–100.0)
PLATELETS: 150 10*3/uL (ref 150–440)
RBC: 3.57 MIL/uL — AB (ref 4.40–5.90)
RDW: 18.3 % — AB (ref 11.5–14.5)
WBC: 9.3 10*3/uL (ref 3.8–10.6)

## 2016-01-07 LAB — CULTURE, BLOOD (ROUTINE X 2)

## 2016-01-07 LAB — PROTIME-INR
INR: 4.45
Prothrombin Time: 41.2 seconds — ABNORMAL HIGH (ref 11.4–15.0)

## 2016-01-07 MED ORDER — CARBIDOPA-LEVODOPA 25-250 MG PO TABS
2.5000 | ORAL_TABLET | Freq: Three times a day (TID) | ORAL | Status: DC
  Administered 2016-01-07 – 2016-01-11 (×11): 2.5 via ORAL
  Filled 2016-01-07 (×11): qty 3

## 2016-01-07 NOTE — Progress Notes (Signed)
ANTICOAGULATION CONSULT NOTE -Follow up [place] for Warfarin Dosing Indication: DVT  Allergies  Allergen Reactions  . Other Other (See Comments)    Dizziness, lightheadedness, Pt states that inhaled medications make him depressed and have negative thoughts.      Patient Measurements: Height: 5\' 9"  (175.3 cm) Weight: 162 lb (73.483 kg) IBW/kg (Calculated) : 70.7  Vital Signs: Temp: 97.7 F (36.5 C) (07/03 0448) Temp Source: Oral (07/03 0448) BP: 118/76 mmHg (07/03 0448) Pulse Rate: 108 (07/03 0448)  Labs:  Recent Labs  01/05/16 [id number]  01/06/16 0429 01/06/16 0856 01/06/16 1430 01/06/16 2034 01/07/16 0335  HGB 10.3*  --  9.7*  --   --   --  9.5*  HCT 31.7*  --  29.4*  --   --   --  28.6*  PLT 157  --  144*  --   --   --  150  LABPROT 32.1*  --  40.8*  --   --   --  41.2*  INR 3.20  --  4.40*  --   --   --  4.45*  CREATININE 0.83  --  0.75  --   --   --  0.60*  TROPONINI 0.11*  < >  --  0.03* 0.04* 0.04*  --   < > = values in this interval not displayed.  Estimated Creatinine Clearance: 85.9 mL/min (by C-G formula based on Cr of 0.6).   Assessment: Patient was taking Coumadin 5mg  daily prior to admission for history of DVT. Pt currently NPO and also on Unasyn at this time.   6/30 INR 3.06  None charted/ordered 7/1   INR 3.20  Hold dose 7/2   INR 4.40  Hold dose 7/3   INR 4.45  Hold dose  Goal of Therapy:  INR 2-3 Monitor platelets by anticoagulation protocol: Yes   Plan:  INR remains elevated today. Will hold Coumadin dose today. F/u AM INR. Will check daily INR levels.  Per RN, no s/sx of bleeding noted. Hgb and plt count relatively stable from yesterday.   Pharmacy will continue to follow.  Rayna Sexton, PharmD, BCPS Clinical Pharmacist 01/07/2016 10:24 AM

## 2016-01-07 NOTE — Care Management (Signed)
Spoke with Melissa with [facility]. She confirmed that patient only had one visit. She will follow for discharge planning.

## 2016-01-07 NOTE — Progress Notes (Signed)
Speech Therapy Note: reviewed chart notes and consulted MD re: pt's NPO status today. Pt is being assessed by GI for a PEG placement per MD. Pt came in this admission w/ suspected aspiration from dysphagia. 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 He had an episode of aspiration pneumonia early part of May/June w/ admission; this is a second aspiration pneumonia. MD has highly recommend patient go back on PEG tube which he is agreeable to it now, and as a matter of fact, he is requesting it. Of note, he recently had one which he requested be removed ~6 mos ago. ST services recommends an alternative means of feeding if indeed pt's pulmonary status is compromised from aspiration episodes, and if pt is desiring it for safer, more adequate, nutrition and hydration. No further skilled ST services indicated at this time as pt appears at his baseline w/ his (baseline)dysphagia and risk for aspiration pneumonia. MD to reconsult ST services if needed. Recommend thorough oral care frequently d/t NPO status. NSG updated.

## 2016-01-07 NOTE — Progress Notes (Signed)
Pt alert and oriented x4, no complaints of pain or discomfort.  Bed in low position, call bell within reach.  Bed alarms on and functioning.  Assessment done and charted.  Will continue to monitor and do hourly rounding throughout the shift 

## 2016-01-07 NOTE — Consult Note (Signed)
Consultation  Referring Provider:     Dr Posey Pronto Admit date: 01/04/16 Consult date       01/07/16  Reason for Consultation:   PEG           HPI:   John Mendoza is a 70 y.o. male with complicated health history including Parkinsons, copd on home 02, DM, seizure disorder, Goodpasture's, Barretts, who was admitted with sepsis/pneumonia attributed to aspiration. Reports other episodes of aspiration pneumonia: 2015 had a PEG but decided he did not want/need it, so had it removed. He has also had several neck surgeries & resultant limited mobility and is missing most his teeth. States he had throat cancer years ago and since the radiation (1990s), his salivary glands no longer produce saliva on the right side of his neck.  PREVIOUS ENDOSCOPIES:            Unable to remember when his last EGD was: was scheduled for EGD and colonoscopy earlier this year with Dr Candace Cruise but patient cancelled these procedures- as he developed diskitis/osteomyelitis- has not rescheduled them yet. States he is feeling generally well in his abdomen. Sometimes has trouble swallowing but attributes this to the above. Thickens his liquids at home with thickener. Takes pills with applesauce. Had speech evaluation with Dr Grandville Silos last July:  Clinical Impression Pt with chronic dysphagia which is suspected to be largely impacted from history of throat cancer and negative changes following radiation therapy course. Pt with moderate to severe oropharyngeal dysphagia: epiglottic inversion very limited, poor laryngeal elevation, and poor base of tongue retraction which lead to aspiration of all consistencies seen both during and after the swallow. Pts swallow function with limited clearance of bolus, despite multiple swallow efforts. Moderate amount of residuals remained in the valleculae, pyriform sinuses, and lateral channels which ended up spilling into laryngeal vestibule and eventually passed below true vocal cords. Sensation of aspirate  events not consistent and cough was ineffective of completely clearing airway. Consulted with MD as pt with recurrent aspiration PNA and not safe to consume PO. Recommend NPO with nutrition and medicines via alternative means. ST to follow up       [facility] IP TREATMENT RECOMMENDATION 02/03/2015           Past Medical History  Diagnosis Date  . Barrett esophagus   . Hypertension   . Hyperlipidemia   . Multiple pulmonary nodules   . CHF (congestive heart failure) ([place])   . Lumbar spinal stenosis   . COPD (chronic obstructive pulmonary disease) ([place])   . CAD (coronary artery disease)     on 2l home oxygen  . On home oxygen therapy   . DVT (deep venous thrombosis) ([place]) 11/2013; 02/01/2015    on coumadin  . Parkinson's disease ([place])   . Recurrent aspiration pneumonia ([place])   . Sleep apnea   . Hypothyroidism   . Type II diabetes mellitus ([place])   . Iron deficiency anemia   . GERD (gastroesophageal reflux disease)   . Seizures ([place])   . DJD (degenerative joint disease)   . Arthritis   . Chronic lower back pain   . History of gout   . Depression   . Anxiety   . Basal cell carcinoma   . Throat cancer ([place])   . Heart attack ([place]) 03/20/1995  . 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 Anterior cervical decomp/discectomy fusion  X 2  . Radical neck dissection Right ~ 1998    "throat cancer"  . Total knee arthroplasty Right 2011  . Thyroidectomy  ~ 1996 X 2  . Cataract extraction w/ intraocular lens  implant, bilateral Bilateral   . Tonsillectomy    . Excisional hemorrhoidectomy    . Joint replacement    . Back surgery    . Posterior fusion cervical spine  X 1  . Coronary angioplasty with stent placement    . Coronary artery bypass graft  1996    "CABG X3"  . Basal cell carcinoma excision      Family History  Problem Relation Age of Onset  . Leukemia Paternal Grandmother    . Cancer Maternal Grandmother     stomach  . Cancer Paternal Aunt   . Other Father     bacterial endocarditis  . Rheum arthritis Father      Social History  Substance Use Topics  . Smoking status: Former Smoker -- 2.00 packs/day for 35 years    Types: Cigarettes    Quit date: 03/20/1995  . Smokeless tobacco: Never Used  . Alcohol Use: Yes     Comment: 02/01/2015 "stopped drinking in ~ 1996; never had problem w/it"    Prior to Admission medications   Medication Sig Start Date End Date Taking? Authorizing Provider  acetaminophen (TYLENOL) 500 MG tablet Take 500 mg by mouth every 6 (six) hours as needed.   Yes Historical Provider, MD  ARIPiprazole (ABILIFY) 5 MG tablet Take 5 mg by mouth 2 (two) times daily.   Yes Historical Provider, MD  Artificial Tear Ointment (DRY EYES OP) Place 1 drop into both eyes daily as needed (dry eyes).   Yes Historical Provider, MD  aspirin EC 81 MG tablet Take 81 mg by mouth at bedtime.    Yes Historical Provider, MD  atorvastatin (LIPITOR) 40 MG tablet Take 40 mg by mouth at bedtime.   Yes Historical Provider, MD  bisoprolol (ZEBETA) 10 MG tablet Take 10 mg by mouth daily.   Yes Historical Provider, MD  carbidopa-levodopa (SINEMET IR) 25-250 MG tablet Take 2.5 tablets by mouth 3 (three) times daily.    Yes Historical Provider, MD  clonazePAM (KLONOPIN) 1 MG tablet Take 1 mg by mouth at bedtime. 09/20/14  Yes Historical Provider, MD  fentaNYL (DURAGESIC - DOSED MCG/HR) 50 MCG/HR Place 1 patch (50 mcg total) onto the skin every 3 (three) days. 02/08/15  Yes Orson Eva, MD  HYDROcodone-acetaminophen (NORCO/VICODIN) 5-325 MG tablet Take 0.5 tablets by mouth every 8 (eight) hours as needed for moderate pain.    Yes Historical Provider, MD  lamoTRIgine (LAMICTAL) 25 MG tablet Take 25 mg by mouth 2 (two) times daily.   Yes Historical Provider, MD  levothyroxine (SYNTHROID, LEVOTHROID) 137 MCG tablet Take 137 mcg by mouth daily.   Yes Historical Provider, MD   losartan (COZAAR) 25 MG tablet Take 25 mg by mouth at bedtime.   Yes Historical Provider, MD  losartan (COZAAR) 50 MG tablet Take 1 tablet by mouth at bedtime.  11/19/15  Yes Historical Provider, MD  metFORMIN (GLUCOPHAGE) 500 MG tablet Take 500 mg by mouth 2 (two) times daily. 12/12/14  Yes Historical Provider, MD  nitroGLYCERIN (NITROSTAT) 0.4 MG SL tablet Place 0.4 mg under the tongue every 5 (five) minutes x 3 doses as needed for chest pain.    Yes Historical Provider, MD  omeprazole (PRILOSEC) 20 MG capsule Take 20 mg by mouth 2 (two) times daily  before a meal.    Yes Historical Provider, MD  PARoxetine (PAXIL) 20 MG tablet Take 60 mg by mouth daily.  07/30/10  Yes Historical Provider, MD  tamsulosin (FLOMAX) 0.4 MG CAPS Take 0.4 mg by mouth daily.    Yes Historical Provider, MD  tiZANidine (ZANAFLEX) 4 MG tablet Take 4 mg by mouth every 8 (eight) hours as needed for muscle spasms.   Yes Historical Provider, MD  triamcinolone cream (KENALOG) 0.5 % Apply 1 application topically 2 (two) times daily as needed (rash).  11/07/15  Yes Historical Provider, MD  warfarin (COUMADIN) 5 MG tablet Take 1 tablet (5 mg total) by mouth daily. 03/08/15  Yes Adin Hector, MD  bisacodyl (DULCOLAX) 10 MG suppository Place 1 suppository (10 mg total) rectally daily as needed for moderate constipation. 02/01/15   Adin Hector, MD    Current Facility-Administered Medications  Medication Dose Route Frequency Provider Last Rate Last Dose  . acetaminophen (TYLENOL) tablet 650 mg  650 mg Oral Q6H PRN Lance Coon, MD   650 mg at 01/05/16 1554   Or  . acetaminophen (TYLENOL) suppository 650 mg  650 mg Rectal Q6H PRN Lance Coon, MD      . Ampicillin-Sulbactam (UNASYN) 3 g in sodium chloride 0.9 % 100 mL IVPB  3 g Intravenous Q6H Fritzi Mandes, MD   3 g at 01/07/16 1257  . ARIPiprazole (ABILIFY) tablet 5 mg  5 mg Oral BID Lance Coon, MD   5 mg at 01/07/16 1026  . aspirin EC tablet 81 mg  81 mg Oral QHS Lance Coon, MD    81 mg at 01/06/16 2255  . atorvastatin (LIPITOR) tablet 40 mg  40 mg Oral QHS Lance Coon, MD   40 mg at 01/06/16 2255  . carbidopa-levodopa (SINEMET IR) 25-250 MG per tablet immediate release 2.5 tablet  2.5 tablet Oral TID Fritzi Mandes, MD   2.5 tablet at 01/07/16 1635  . clonazePAM (KLONOPIN) tablet 0.5 mg  0.5 mg Oral QHS PRN Laverle Hobby, MD   0.5 mg at 01/06/16 2255  . dextrose 5 %-0.45 % sodium chloride infusion   Intravenous Continuous Laverle Hobby, MD 35 mL/hr at 01/06/16 2318    . fentaNYL (DURAGESIC - dosed mcg/hr) 50 mcg  50 mcg Transdermal Q72H Lance Coon, MD   50 mcg at 01/05/16 1346  . furosemide (LASIX) injection 40 mg  40 mg Intravenous Once Mikael Spray, NP      . hydrALAZINE (APRESOLINE) injection 10 mg  10 mg Intravenous Q6H PRN Fritzi Mandes, MD      . hydrocortisone sodium succinate (SOLU-CORTEF) 100 MG injection 50 mg  50 mg Intravenous Daily Fritzi Mandes, MD   50 mg at 01/07/16 1027  . ipratropium-albuterol (DUONEB) 0.5-2.5 (3) MG/3ML nebulizer solution 3 mL  3 mL Nebulization Q4H PRN Lance Coon, MD      . lamoTRIgine (LAMICTAL) tablet 25 mg  25 mg Oral BID Lance Coon, MD   25 mg at 01/07/16 1027  . levothyroxine (SYNTHROID, LEVOTHROID) tablet 137 mcg  137 mcg Oral QAC breakfast Fritzi Mandes, MD   137 mcg at 01/07/16 1026  . metoprolol tartrate (LOPRESSOR) tablet 25 mg  25 mg Oral BID Harrie Foreman, MD   25 mg at 01/07/16 1027  . pantoprazole (PROTONIX) EC tablet 40 mg  40 mg Oral BID Lance Coon, MD   40 mg at 01/07/16 1026  . PARoxetine (PAXIL) tablet 60 mg  60 mg Oral Daily Shanon Brow  Jannifer Franklin, MD   60 mg at 01/07/16 1027  . sodium chloride flush (NS) 0.9 % injection 3 mL  3 mL Intravenous Q12H Lance Coon, MD   3 mL at 01/07/16 1028  . tamsulosin (FLOMAX) capsule 0.4 mg  0.4 mg Oral Daily Lance Coon, MD   0.4 mg at 01/07/16 1027  . Warfarin - Pharmacist Dosing Inpatient   Does not apply q1800 Vira Blanco, [facility]        Allergies as of 01/04/2016 -  Review Complete 01/04/2016  Allergen Reaction Noted  . Other Other (See Comments) 01/15/2012     Review of Systems:    All systems reviewed and negative except where noted in HPI. Was also admitted with fever/chills/pleuritic chest pain/cough/sob, tremors, and falls.  .    Physical Exam:  Vital signs in last 24 hours: Temp:  [97.5 F (36.4 C)-98.3 F (36.8 C)] 97.8 F (36.6 C) (07/03 1104) Pulse Rate:  [108-120] 116 (07/03 1104) Resp:  [16-18] 18 (07/03 1104) BP: (116-135)/(76-93) 135/86 mmHg (07/03 1104) SpO2:  [93 %-95 %] 93 % (07/03 1104) Last BM Date: 01/03/16 General:   Pleasant man in NAD Head:  Normocephalic and atraumatic. Eyes:   No icterus.   Conjunctiva pink. Ears:  Normal auditory acuity. Mouth: Mucosa pink moist, no lesions. Neck:  Supple; no masses felt Lungs:  Respirations even and unlabored. Lungs clear to auscultation bilaterally.   No wheezes, crackles, or rhonchi.  Heart:  S1S2, RRR, no MRG. No edema. Abdomen:   Flat, soft, nondistended, nontender. Normal bowel sounds. No appreciable masses or hepatomegaly. No rebound signs or other peritoneal signs. Msk:  MAEW x4, No clubbing or cyanosis. Reduced ROM to his neck. Strength 5/5. Symmetrical without gross deformities. Neurologic:  Alert and  oriented x4;  Cranial nerves II-XII intact.  Skin:  Warm, dry, pink without significant lesions or rashes. Psych:  Alert and cooperative. Normal affect.  LAB RESULTS:  Recent Labs  01/05/16 0632 01/06/16 0429 01/07/16 0335  WBC 2.8* 9.2 9.3  HGB 10.3* 9.7* 9.5*  HCT 31.7* 29.4* 28.6*  PLT 157 144* 150   BMET  Recent Labs  01/05/16 0632 01/06/16 0429 01/07/16 0335  NA 135 134* 136  K 4.9 3.9 3.8  CL 99* 101 102  CO2 28 27 31   GLUCOSE 106* 119* 100*  BUN 24* 27* 21*  CREATININE 0.83 0.75 0.60*  CALCIUM 8.2* 8.1* 8.0*   LFT  Recent Labs  01/04/16 2003  PROT 7.0  ALBUMIN 3.7  AST 18  ALT 10*  ALKPHOS 69  BILITOT 0.7   PT/INR  Recent  Labs  01/06/16 0429 01/07/16 0335  LABPROT 40.8* 41.2*  INR 4.40* 4.45*    STUDIES: Dg Chest 1 View  01/07/2016  CLINICAL DATA:  Shortness of breath. EXAM: CHEST 1 VIEW COMPARISON:  01/06/2016.  01/04/2016 . FINDINGS: Prior CABG. Cardiomegaly with mild bibasilar atelectasis and/or infiltrates. Small bilateral pleural effusions. Findings suggest mild congestive heart failure. Slight progression from prior exam. Bibasilar pneumonia cannot be excluded. No pneumothorax. IMPRESSION: Prior CABG. Cardiomegaly. Bibasilar atelectasis and/or infiltrates with small bilateral pleural effusions. Findings suggest mild congestive heart failure. Slight progression from prior exam. Bibasilar pneumonia cannot be excluded . Electronically Signed   By: Marcello Moores  Register   On: 01/07/2016 07:21   Dg Chest 1 View  01/06/2016  CLINICAL DATA:  Shortness of Breath EXAM: CHEST 1 VIEW COMPARISON:  01/04/2016 FINDINGS: Cardiac shadow is enlarged but stable. Postsurgical changes are noted. Bibasilar atelectatic changes are  again seen with small pleural effusions bilaterally. No new focal abnormality is noted. IMPRESSION: Bibasilar changes stable from the prior exam. Electronically Signed   By: Inez Catalina M.D.   On: 01/06/2016 07:41       Impression / Plan:   1. Chronic dysphagia/recurrent pulmonary infections. Likely multifactorial given his decreased oral saliva, edentulous state, he is overdue to Barretts screen. Discussed PEG placment with patient who is agreeable. However his INR is currently 4.45. This will need to be corrected prior to procedure. Will discuss further with Dr Gustavo Lah: However a jejunal tube should be considered, given his recurrent pulmonary infections, and concerns for aspiration, and a PEG will not prevent aspiration. Per Up to Date: "Although most patients can be started on low volume continuous intragastric feeds, a significant history of any of the following would favor starting with jejunal  feeding: ?Recurrent aspiration of gastric contents ?Esophageal dysmotility with a history of regurgitation ?Delayed gastric emptying"   Thank you very much for this consult. These services were provided by Stephens November, NP-C, in collaboration with Lollie Sails, MD, with whom I have discussed this patient in full.   Stephens November, NP-C  Discussed with Dr Gustavo Lah- GI would favor GJ tube. Inr will need to be corrected prior to procedure.

## 2016-01-07 NOTE — Progress Notes (Signed)
0530- Dr. Marcille Blanco notified that pt is having runs of SVT.  Bolus of amio ordered and given.    21- Dr. Marcille Blanco notified that pt's heart rate has come down some with bolus but still tachy in the 120's.  Metoprolol ordered to start po. Also notified him that it looks like pt flipped into a-fib.

## 2016-01-07 NOTE — Care Management (Signed)
Admitted with recurrent aspiration PNA. Spoke with wife. Patient lives at home with his [name] [phone] Home/[phone] cell). He uses home O2 though [employer]. No CPAP. He has a walker. She states that [facility] did not open patient after last hospitalization. Wife states "they came out one time and thought he was to sick and recommended he go to the emergency room." Wife is concerned that this hospitalization he may have his PEG replaced (had one in the past). She definitely wants home health and wants to use [facility] because they have used them in the past. Will follow and make new referral to [facility].

## 2016-01-07 NOTE — Progress Notes (Signed)
Inpatient Diabetes Program Recommendations  AACE/ADA: New Consensus Statement on Inpatient Glycemic Control (2015)  Target Ranges:  Prepandial:   less than 140 mg/dL      Peak postprandial:   less than 180 mg/dL (1-2 hours)      Critically ill patients:  140 - 180 mg/dL     Review of Glycemic ControlResults for John Mendoza, John Mendoza (MRN [id number]) as of 01/07/2016 12:04  Ref. Range 01/07/2016 11:43  Glucose-Capillary Latest Ref Range: 65-99 mg/dL 103 (H)    Diabetes history: Type 2 diabetes Outpatient Diabetes medications: Metformin 500 mg bid Current orders for Inpatient glycemic control:  Standard Novolog correction q 4 hours-per ICU glycemic control protocol  Inpatient Diabetes Program Recommendations:    Blood sugars currently controlled.  Please d/c ICU glycemic control order set. Consider checking CBG's tid with meals- If blood sugars greater than 150 mg/dL, consider adding Novolog sensitive correction.   Thanks, Adah Perl, RN, BC-ADM Inpatient Diabetes Coordinator Pager [phone] (8a-5p)

## 2016-01-07 NOTE — Progress Notes (Signed)
Took over care of patient from [place]. Patient has been alert and oriented. No complaints of pain. Per previous RN patient able to take medications crushed in applesauce. No visible signs of aspiration. Patient states he wants the feeding tube for now, but he is contemplating changing his mind and eating regular food at some point.

## 2016-01-07 NOTE — Care Management Important Message (Signed)
Important Message  Patient Details  Name: John Mendoza MRN: [id number] Date of Birth: [birth year]   Medicare Important Message Given:  Yes    Jolly Mango, RN 01/07/2016, 2:30 PM

## 2016-01-07 NOTE — Progress Notes (Signed)
Spoke with Dr.Diamond about critical INR of 4.45, new order to hold coumadin. John Mendoza

## 2016-01-07 NOTE — Consult Note (Signed)
Please see full GI consult by Ms Jacqulyn Liner.  Patient admitted with sepsis and PNA.  HO PEG.  Concern for possible aspiration risk.  As discussed with Dr Posey Pronto, a peg does not prevent aspiration, and if that is the primary concern, I would recommend a PEJ done surgically, however, peg may be of benefit in regard to history of dysphagia and throat cancer. Will arrange when possible this week. Agree with speech-path recs re feedings, consider NGT if needed sooner.

## 2016-01-07 NOTE — Progress Notes (Signed)
Pt asleep.  Bed in low position.  Call bell within reach.  Report given to [facility]

## 2016-01-07 NOTE — Progress Notes (Signed)
Patient ID: John Mendoza, male   DOB: Aug 01, 1945, 70 y.o.   MRN: [id number]   Spoke with Dr Burt Knack, [place], DR Ramonita Lab and Speech therapy  Suggest pt get PEG tube given long term issues with GJ tube and issues with clogging and not able to use certain kinds of feeding as well. Patient also did not have any complications with PEG feeding and was doing well.  Will d/w dr Gustavo Lah to see if pt can get PEG placed after INR is down. Paged him. Awaiting call back.

## 2016-01-07 NOTE — Progress Notes (Signed)
Patient ID: SPERO WEHE, male   DOB: Jul 30, 1945, 70 y.o.   MRN: [id number] [place] at [place] NAME: John Mendoza    MR#:  [id number]  DATE OF BIRTH:  1946-04-27  SUBJECTIVE:  Patient came in again with recurrent symptoms of aspiration pneumonia. He presented with sepsis. sobh and hypoxia. He was on BiPAP now satting 94% on 3 L nasal, oxygen. Tachycardia with NSVT. Received loading dose of IV amiodarone. HR in the 100's REVIEW OF SYSTEMS:   Review of Systems  Constitutional: Negative for fever, chills and weight loss.  HENT: Negative for ear discharge, ear pain and nosebleeds.   Eyes: Negative for blurred vision, pain and discharge.  Respiratory: Positive for cough and shortness of breath. Negative for sputum production, wheezing and stridor.   Cardiovascular: Negative for chest pain, palpitations, orthopnea and PND.  Gastrointestinal: Negative for nausea, vomiting, abdominal pain and diarrhea.  Genitourinary: Negative for urgency and frequency.  Musculoskeletal: Negative for back pain and joint pain.  Neurological: Positive for weakness. Negative for sensory change, speech change and focal weakness.  Psychiatric/Behavioral: Negative for depression and hallucinations. The patient is not nervous/anxious.   All other systems reviewed and are negative.  Tolerating Diet: Nothing by mouth Tolerating PT: Pending  DRUG ALLERGIES:   Allergies  Allergen Reactions  . Other Other (See Comments)    Dizziness, lightheadedness, Pt states that inhaled medications make him depressed and have negative thoughts.      VITALS:  Blood pressure 135/86, pulse 116, temperature 97.8 F (36.6 C), temperature source Oral, resp. rate 18, height 5\' 9"  (1.753 m), weight 73.483 kg (162 lb), SpO2 93 %.  PHYSICAL EXAMINATION:   Physical Exam  GENERAL:  70 y.o.-year-old patient lying in the bed with no acute distress.  EYES: Pupils equal, round, reactive  to light and accommodation. No scleral icterus. Extraocular muscles intact.  HEENT: Head atraumatic, normocephalic. Oropharynx and nasopharynx clear.  NECK:  Supple, no jugular venous distention. No thyroid enlargement, no tenderness.  LUNGS: Coarse breath sounds bilaterally, no wheezing, rales, rhonchi. No use of accessory muscles of respiration.  CARDIOVASCULAR: S1, S2 normal. No murmurs, rubs, or gallops.  ABDOMEN: Soft, nontender, nondistended. Bowel sounds present. No organomegaly or mass.  EXTREMITIES: No cyanosis, clubbing or edema b/l.    NEUROLOGIC: Cranial nerves II through XII are intact. No focal Motor or sensory deficits b/l.   PSYCHIATRIC:  patient is alert and oriented x 3.  SKIN: No obvious rash, lesion, or ulcer.   LABORATORY PANEL:  CBC  Recent Labs Lab 01/07/16 0335  WBC 9.3  HGB 9.5*  HCT 28.6*  PLT 150    Chemistries   Recent Labs Lab 01/04/16 2003 01/05/16 0632  01/07/16 0335  NA 134* 135  < > 136  K 4.0 4.9  < > 3.8  CL 95* 99*  < > 102  CO2 30 28  < > 31  GLUCOSE 160* 106*  < > 100*  BUN 22* 24*  < > 21*  CREATININE 0.91 0.83  < > 0.60*  CALCIUM 9.2 8.2*  < > 8.0*  MG  --  1.1*  --   --   AST 18  --   --   --   ALT 10*  --   --   --   ALKPHOS 69  --   --   --   BILITOT 0.7  --   --   --   < > =  values in this interval not displayed. Cardiac Enzymes  Recent Labs Lab 01/06/16 2034  TROPONINI 0.04*   RADIOLOGY:  Dg Chest 1 View  01/07/2016  CLINICAL DATA:  Shortness of breath. EXAM: CHEST 1 VIEW COMPARISON:  01/06/2016.  01/04/2016 . FINDINGS: Prior CABG. Cardiomegaly with mild bibasilar atelectasis and/or infiltrates. Small bilateral pleural effusions. Findings suggest mild congestive heart failure. Slight progression from prior exam. Bibasilar pneumonia cannot be excluded. No pneumothorax. IMPRESSION: Prior CABG. Cardiomegaly. Bibasilar atelectasis and/or infiltrates with small bilateral pleural effusions. Findings suggest mild congestive  heart failure. Slight progression from prior exam. Bibasilar pneumonia cannot be excluded . Electronically Signed   By: John Mendoza  Register   On: 01/07/2016 07:21   Dg Chest 1 View  01/06/2016  CLINICAL DATA:  Shortness of Breath EXAM: CHEST 1 VIEW COMPARISON:  01/04/2016 FINDINGS: Cardiac shadow is enlarged but stable. Postsurgical changes are noted. Bibasilar atelectatic changes are again seen with small pleural effusions bilaterally. No new focal abnormality is noted. IMPRESSION: Bibasilar changes stable from the prior exam. Electronically Signed   By: John Mendoza M.D.   On: 01/06/2016 07:41   ASSESSMENT AND PLAN:  John Mendoza is a 70 y.o. male who presents with Sepsis from pneumonia, likely aspiration pneumonia. Patient has a known history of Parkinson's and has had aspiration events in the past. He states that for the past couple days he has started feeling worse. He specifically complains of pleuritic chest pain with deep breathing. His wife states that he had episodes of chills with fever at home earlier in the day today. On arrival to the ED he was hypoxic on room air with O2 sats in the mid 80s  1. Sepsis ([place]) - hemodynamically stable -Came in with tachycardia hypoxia chest x-ray evident with aspiration pneumonia and elevated lactic acid  -1 out of 2 blood culture positive for Klebsiella pneumoniae -Discussed antibiotic choice with pharmacy and will keep patient on Unasyn since its aspiration to cover anaerobes as well -When necessary BiPAP. Sats stable.  2. Recurrent Aspiration pneumonia ([place]) - IV antibiotics and cultures as above, add sputum culture -Seen by speech therapy. Patient has had a PEG tube placement in the remote past. - It was removed 6 months ago due to pt's request. Pt did not have any issues with it.  -d/w dr Gustavo Lah who recommends pt gets GJ tube. -surgical consultation placed -He had episode of aspiration pneumonia early part of June this is a second aspiration and  highly recommend patient go back on PEG tube which he is agreeable to it now and matter of fact he is requesting get  3 Hypertension - currently normotensive, hold home antihypertensives for now  4 CAD (coronary artery disease) - continue home meds   5 Parkinsons disease ([place]) - continue home meds   6 Hypothyroidism - continue home dose thyroid replacement   CODE STATUS: full  DVT Prophylaxis: lovenox  TOTAL TIME TAKING CARE OF THIS PATIENT: 30 minutes.  >50% time spent on counselling and coordination of care  POSSIBLE D/C IN 1-2 DAYS, DEPENDING ON CLINICAL CONDITION.  Note: This dictation was prepared with Dragon dictation along with smaller phrase technology. Any transcriptional errors that result from this process are unintentional.  Otha Monical M.D on 01/07/2016 at 11:41 AM  Between 7am to 6pm - Pager - [phone]  After 6pm go to [email or web address] - password [email or web address] [facility]  [facility]  Office  [phone]  CC: Primary care physician; Adin Hector, MD

## 2016-01-08 LAB — PROTIME-INR
INR: 5.15
Prothrombin Time: 46 seconds — ABNORMAL HIGH (ref 11.4–15.0)

## 2016-01-08 MED ORDER — VITAMIN K1 10 MG/ML IJ SOLN
5.0000 mg | Freq: Once | INTRAMUSCULAR | Status: AC
  Administered 2016-01-08: 5 mg via SUBCUTANEOUS
  Filled 2016-01-08: qty 0.5

## 2016-01-08 NOTE — Progress Notes (Signed)
ANTICOAGULATION CONSULT NOTE - Follow up [place] for Warfarin Dosing Indication: DVT  Allergies  Allergen Reactions  . Other Other (See Comments)    Dizziness, lightheadedness, Pt states that inhaled medications make him depressed and have negative thoughts.      Patient Measurements: Height: 5\' 9"  (175.3 cm) Weight: 162 lb (73.483 kg) IBW/kg (Calculated) : 70.7  Vital Signs: Temp: 97.8 F (36.6 C) (07/04 0524) Temp Source: Oral (07/04 0524) BP: 150/82 mmHg (07/04 0623) Pulse Rate: 116 (07/04 0623)  Labs:  Recent Labs  01/06/16 0429 01/06/16 0856 01/06/16 1430 01/06/16 2034 01/07/16 0335 01/08/16 0352  HGB 9.7*  --   --   --  9.5*  --   HCT 29.4*  --   --   --  28.6*  --   PLT 144*  --   --   --  150  --   LABPROT 40.8*  --   --   --  41.2* 46.0*  INR 4.40*  --   --   --  4.45* 5.15*  CREATININE 0.75  --   --   --  0.60*  --   TROPONINI  --  0.03* 0.04* 0.04*  --   --     Estimated Creatinine Clearance: 85.9 mL/min (by C-G formula based on Cr of 0.6).   Assessment: Patient was taking Coumadin 5mg  daily prior to admission for history of DVT. Pt currently NPO and also on Unasyn at this time.   6/30 INR 3.06  None charted/ordered 7/1   INR 3.20  Hold dose 7/2   INR 4.40  Hold dose 7/3   INR 4.45  Hold dose 7/4   INR 5.15  Hold dose  Goal of Therapy:  INR 2-3 Monitor platelets by anticoagulation protocol: Yes   Plan:  INR remains elevated today. Will hold Coumadin dose today. F/U AM INR. Will check daily INR levels.  Pharmacy will continue to follow.  Olivia Canter, [facility] Clinical Pharmacist 01/08/2016, 7:53 AM

## 2016-01-08 NOTE — Progress Notes (Signed)
RN notified of critical INR of 5.15. MD Marcille Blanco notified. No new orders at this time. Will continue to monitor.    John Mendoza

## 2016-01-08 NOTE — Progress Notes (Signed)
Patient ID: John Mendoza, male   DOB: 1946-06-28, 70 y.o.   MRN: [id number] [place] at [place] NAME: [name]    MR#:  [id number]  DATE OF BIRTH:  07-31-1945  SUBJECTIVE:  Patient came in again with recurrent symptoms of aspiration pneumonia. He presented with sepsis. sobh and hypoxia. now satting 94% on 3 L nasal, oxygen. No new complaints. Very hungry REVIEW OF SYSTEMS:   Review of Systems  Constitutional: Negative for fever, chills and weight loss.  HENT: Negative for ear discharge, ear pain and nosebleeds.   Eyes: Negative for blurred vision, pain and discharge.  Respiratory: Positive for cough and shortness of breath. Negative for sputum production, wheezing and stridor.   Cardiovascular: Negative for chest pain, palpitations, orthopnea and PND.  Gastrointestinal: Negative for nausea, vomiting, abdominal pain and diarrhea.  Genitourinary: Negative for urgency and frequency.  Musculoskeletal: Negative for back pain and joint pain.  Neurological: Positive for weakness. Negative for sensory change, speech change and focal weakness.  Psychiatric/Behavioral: Negative for depression and hallucinations. The patient is not nervous/anxious.   All other systems reviewed and are negative.  Tolerating Diet: Nothing by mouth Tolerating PT: Pending  DRUG ALLERGIES:   Allergies  Allergen Reactions  . Other Other (See Comments)    Dizziness, lightheadedness, Pt states that inhaled medications make him depressed and have negative thoughts.      VITALS:  Blood pressure 150/82, pulse 116, temperature 97.8 F (36.6 C), temperature source Oral, resp. rate 18, height 5\' 9"  (1.753 m), weight 73.483 kg (162 lb), SpO2 97 %.  PHYSICAL EXAMINATION:   Physical Exam  GENERAL:  70 y.o.-year-old patient lying in the bed with no acute distress.  EYES: Pupils equal, round, reactive to light and accommodation. No scleral icterus. Extraocular  muscles intact.  HEENT: Head atraumatic, normocephalic. Oropharynx and nasopharynx clear.  NECK:  Supple, no jugular venous distention. No thyroid enlargement, no tenderness.  LUNGS: Coarse breath sounds bilaterally, no wheezing, rales, rhonchi. No use of accessory muscles of respiration.  CARDIOVASCULAR: S1, S2 normal. No murmurs, rubs, or gallops.  ABDOMEN: Soft, nontender, nondistended. Bowel sounds present. No organomegaly or mass.  EXTREMITIES: No cyanosis, clubbing or edema b/l.    NEUROLOGIC: Cranial nerves II through XII are intact. No focal Motor or sensory deficits b/l.   PSYCHIATRIC:  patient is alert and oriented x 3.  SKIN: No obvious rash, lesion, or ulcer.   LABORATORY PANEL:  CBC  Recent Labs Lab 01/07/16 0335  WBC 9.3  HGB 9.5*  HCT 28.6*  PLT 150    Chemistries   Recent Labs Lab 01/04/16 2003 01/05/16 0632  01/07/16 0335  NA 134* 135  < > 136  K 4.0 4.9  < > 3.8  CL 95* 99*  < > 102  CO2 30 28  < > 31  GLUCOSE 160* 106*  < > 100*  BUN 22* 24*  < > 21*  CREATININE 0.91 0.83  < > 0.60*  CALCIUM 9.2 8.2*  < > 8.0*  MG  --  1.1*  --   --   AST 18  --   --   --   ALT 10*  --   --   --   ALKPHOS 69  --   --   --   BILITOT 0.7  --   --   --   < > = values in this interval not displayed. Cardiac Enzymes  Recent  Labs Lab 01/06/16 2034  TROPONINI 0.04*   RADIOLOGY:  Dg Chest 1 View  01/07/2016  CLINICAL DATA:  Shortness of breath. EXAM: CHEST 1 VIEW COMPARISON:  01/06/2016.  01/04/2016 . FINDINGS: Prior CABG. Cardiomegaly with mild bibasilar atelectasis and/or infiltrates. Small bilateral pleural effusions. Findings suggest mild congestive heart failure. Slight progression from prior exam. Bibasilar pneumonia cannot be excluded. No pneumothorax. IMPRESSION: Prior CABG. Cardiomegaly. Bibasilar atelectasis and/or infiltrates with small bilateral pleural effusions. Findings suggest mild congestive heart failure. Slight progression from prior exam. Bibasilar  pneumonia cannot be excluded . Electronically Signed   By: Marcello Moores  Register   On: 01/07/2016 07:21   ASSESSMENT AND PLAN:  John Mendoza is a 70 y.o. male who presents with Sepsis from pneumonia, likely aspiration pneumonia. Patient has a known history of Parkinson's and has had aspiration events in the past. He states that for the past couple days he has started feeling worse. He specifically complains of pleuritic chest pain with deep breathing. His wife states that he had episodes of chills with fever at home earlier in the day today. On arrival to the ED he was hypoxic on room air with O2 sats in the mid 80s  1. Sepsis ([place]) - hemodynamically stable -Came in with tachycardia hypoxia chest x-ray evident with aspiration pneumonia and elevated lactic acid  -1 out of 2 blood culture positive for Klebsiella pneumoniae -Discussed antibiotic choice with pharmacy and will keep patient on Unasyn since its aspiration to cover anaerobes as well -When necessary BiPAP. Sats stable.  2. Recurrent Aspiration pneumonia ([place]) - IV antibiotics and cultures as above, add sputum culture -Seen by speech therapy. Patient has had a PEG tube placement in the remote past. - It was removed 6 months ago due to pt's request. Pt did not have any issues with it.  -d/w dr Gustavo Lah who will place PEG tube this week once INR stable.  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 Will give small dose of vit K sq today   Pt's PCP Dr [name] is aware and agrees with the plan  CODE STATUS: full  DVT Prophylaxis: therap INR  TOTAL TIME TAKING CARE OF THIS PATIENT: 30 minutes.  >50% time spent on counselling and coordination of care  POSSIBLE D/C IN 1-2 DAYS,  DEPENDING ON CLINICAL CONDITION.  Note: This dictation was prepared with Dragon dictation along with smaller phrase technology. Any transcriptional errors that result from this process are unintentional.  Huntington Leverich M.D on 01/08/2016 at 8:06 AM  Between 7am to 6pm - Pager - [phone]  After 6pm go to [email or web address] - password [email or web address] [facility]  [facility]  Office  [phone]  CC: Primary care physician; Adin Hector, MD

## 2016-01-09 LAB — CULTURE, BLOOD (ROUTINE X 2): CULTURE: NO GROWTH

## 2016-01-09 LAB — PROTIME-INR
INR: 1.31
Prothrombin Time: 16.4 seconds — ABNORMAL HIGH (ref 11.4–15.0)

## 2016-01-09 MED ORDER — ENOXAPARIN SODIUM 40 MG/0.4ML ~~LOC~~ SOLN
40.0000 mg | SUBCUTANEOUS | Status: DC
  Administered 2016-01-09 – 2016-01-10 (×2): 40 mg via SUBCUTANEOUS
  Filled 2016-01-09 (×2): qty 0.4

## 2016-01-09 NOTE — Progress Notes (Signed)
[place] at [place] NAME: John Mendoza    MR#:  [id number]  DATE OF BIRTH:  05/03/1946  SUBJECTIVE:  CHIEF COMPLAINT:   Chief Complaint  Patient presents with  . Pleurisy    possible sepsis  . Code Sepsis   - remains NPO- requesting apple sauce - will need PEG, admitted for aspiration pneumonia - refused NG tube,  REVIEW OF SYSTEMS:  Review of Systems  Constitutional: Positive for malaise/fatigue. Negative for fever and chills.  HENT: Negative for ear discharge, ear pain and nosebleeds.   Eyes: Negative for blurred vision and double vision.  Respiratory: Negative for cough, shortness of breath and wheezing.   Cardiovascular: Negative for chest pain, palpitations and leg swelling.  Gastrointestinal: Negative for nausea, vomiting, abdominal pain, diarrhea and constipation.  Genitourinary: Negative for dysuria.  Musculoskeletal: Negative for myalgias.  Neurological: Negative for dizziness, sensory change, speech change, focal weakness, seizures and headaches.  Psychiatric/Behavioral: Negative for depression.    DRUG ALLERGIES:   Allergies  Allergen Reactions  . Other Other (See Comments)    Dizziness, lightheadedness, Pt states that inhaled medications make him depressed and have negative thoughts.      VITALS:  Blood pressure 147/91, pulse 102, temperature 98 F (36.7 C), temperature source Oral, resp. rate 18, height 5\' 9"  (1.753 m), weight 73.483 kg (162 lb), SpO2 95 %.  PHYSICAL EXAMINATION:  Physical Exam  GENERAL:  70 y.o.-year-old patient lying in the bed with no acute distress.  EYES: Pupils equal, round, reactive to light and accommodation. No scleral icterus. Extraocular muscles intact.  HEENT: Head atraumatic, normocephalic. Oropharynx and nasopharynx clear.  NECK:  Supple, no jugular venous distention. No thyroid enlargement, no tenderness.  LUNGS: Normal breath sounds bilaterally, no wheezing,  rales,rhonchi or crepitation. No use of accessory muscles of respiration.  CARDIOVASCULAR: S1, S2 normal. No murmurs, rubs, or gallops.  ABDOMEN: Soft, nontender, nondistended. Bowel sounds present. No organomegaly or mass.  EXTREMITIES: No pedal edema, cyanosis, or clubbing.  NEUROLOGIC: Cranial nerves II through XII are intact. Muscle strength 4/5 in all extremities. Sensation intact. Gait not checked.  PSYCHIATRIC: The patient is alert and oriented x 3. SKIN: No obvious rash, lesion, or ulcer.    LABORATORY PANEL:   CBC  Recent Labs Lab 01/07/16 0335  WBC 9.3  HGB 9.5*  HCT 28.6*  PLT 150   ------------------------------------------------------------------------------------------------------------------  Chemistries   Recent Labs Lab 01/04/16 2003 01/05/16 0632  01/07/16 0335  NA 134* 135  < > 136  K 4.0 4.9  < > 3.8  CL 95* 99*  < > 102  CO2 30 28  < > 31  GLUCOSE 160* 106*  < > 100*  BUN 22* 24*  < > 21*  CREATININE 0.91 0.83  < > 0.60*  CALCIUM 9.2 8.2*  < > 8.0*  MG  --  1.1*  --   --   AST 18  --   --   --   ALT 10*  --   --   --   ALKPHOS 69  --   --   --   BILITOT 0.7  --   --   --   < > = values in this interval not displayed. ------------------------------------------------------------------------------------------------------------------  Cardiac Enzymes  Recent Labs Lab 01/06/16 2034  TROPONINI 0.04*   ------------------------------------------------------------------------------------------------------------------  RADIOLOGY:  No results found.  EKG:   Orders placed or performed during the hospital encounter of 01/04/16  .  EKG 12-Lead  . EKG 12-Lead  . ED EKG  . ED EKG  . EKG 12-Lead  . EKG 12-Lead    ASSESSMENT AND PLAN:   70 year old male with past medical history significant for Parkinson's disease, COPD on 2 L home oxygen, history of DVT on Coumadin, seizure disorder, arthritis, CHF, CAD, history of throat cancer status post  radiation presents to the hospital secondary to recurrent aspiration pneumonia.  #1 sepsis-secondary to aspiration pneumonia. -Recurrent aspiration history. PEG tube in the past was taken out. -Currently on Unasyn. Continue on to Start on Oral Antibiotics after the PEG Tube. -Will Need 2 Weeks of antibiotics with postural blood cultures.  #2 recurrent aspiration pneumonia-sided to Parkinson's disease and also radiation to throat cancer. -Agreeable for repeat PEG tube placement. Consult with GI. INR is less than 2 at this time. -Dr. Allen Mendoza to do PEG tube on Friday now -Patient refused NG feedings  #3 hypothyroidism- continue synthroid  #4 hypertension-on metoprolol.  #5 COPD-  Chronically on 2L o2, was needing Bipap on admission- improved to 3L now - appears stable F/u CXR today - inh   #6 DVT Prophylaxis- will start lovenox- was on coumadin for DVT as outpatient- currently on hold for PEG  Discharge after PEG placement and monitoring it for 24hrs after    All the records are reviewed and case discussed with Care Management/Social Workerr. Management plans discussed with the patient, family and they are in agreement.  CODE STATUS: Full Code  TOTAL TIME TAKING CARE OF THIS PATIENT: 38 minutes.   POSSIBLE D/C IN 2 DAYS, DEPENDING ON CLINICAL CONDITION.   John Mendoza M.D on 01/09/2016 at 3:37 PM  Between 7am to 6pm - Pager - [phone]  After 6pm go to [email or web address] - password [email or web address] [facility]  [facility]  Office  [phone]  CC: Primary care physician; John Hector, MD

## 2016-01-09 NOTE — Care Management Important Message (Signed)
Important Message  Patient Details  Name: ALEJANDRO AJELLO MRN: [id number] Date of Birth: 1945-09-11   Medicare Important Message Given:  Yes    Juliann Pulse A Rohini Jaroszewski 01/09/2016, 11:17 AM

## 2016-01-09 NOTE — Progress Notes (Signed)
RN notified by CCMD of 20 beat run of SVT. Pt asymptomatic, resting comfortably with VSS. MD Dr. Claria Dice notified. Orders placed for BMP and magnesium levels to be drawn. RN will continue to monitor. Rachael Fee, RN

## 2016-01-09 NOTE — Care Management (Signed)
Barrier to discharge: PEG placement this week when INR stable. PNA with IV antibiotics.

## 2016-01-09 NOTE — Progress Notes (Signed)
ANTICOAGULATION CONSULT NOTE - Follow up [place] for Warfarin Dosing Indication: DVT  Allergies  Allergen Reactions  . Other Other (See Comments)    Dizziness, lightheadedness, Pt states that inhaled medications make him depressed and have negative thoughts.      Patient Measurements: Height: 5\' 9"  (175.3 cm) Weight: 162 lb (73.483 kg) IBW/kg (Calculated) : 70.7  Vital Signs: Temp: 98 F (36.7 C) (07/05 1127) Temp Source: Oral (07/05 1127) BP: 147/91 mmHg (07/05 1127) Pulse Rate: 102 (07/05 1127)  Labs:  Recent Labs  01/06/16 2034 01/07/16 0335 01/08/16 0352 01/09/16 0404  HGB  --  9.5*  --   --   HCT  --  28.6*  --   --   PLT  --  150  --   --   LABPROT  --  41.2* 46.0* 16.4*  INR  --  4.45* 5.15* 1.31  CREATININE  --  0.60*  --   --   TROPONINI 0.04*  --   --   --     Estimated Creatinine Clearance: 85.9 mL/min (by C-G formula based on Cr of 0.6).   Assessment: Patient was taking Coumadin 5mg  daily prior to admission for history of DVT. Pt currently NPO and also on Unasyn at this time.   6/30 INR 3.06  None charted/ordered 7/1   INR 3.20  Hold dose 7/2   INR 4.40  Hold dose 7/3   INR 4.45  Hold dose 7/4   INR 5.15  Hold dose 7/5: INR: 1.31; patient received 5 mg of Vit K on 7/4.   Goal of Therapy:  INR 2-3 Monitor platelets by anticoagulation protocol: Yes   Plan:  Per MD Kalisetti holding warfarin therapy for PEG Placement.   Pharmacy will continue to follow.  Larene Beach, [facility] Clinical Pharmacist 01/09/2016, 2:59 PM

## 2016-01-10 ENCOUNTER — Encounter: Payer: Self-pay | Admitting: Anesthesiology

## 2016-01-10 ENCOUNTER — Inpatient Hospital Stay: Payer: Medicare Other | Admitting: Anesthesiology

## 2016-01-10 ENCOUNTER — Encounter
Admission: EM | Disposition: A | Discharge status: Home / Self Care | Payer: Self-pay | Source: Home / Self Care | Attending: Internal Medicine

## 2016-01-10 DIAGNOSIS — R1310 Dysphagia, unspecified: Secondary | ICD-10-CM

## 2016-01-10 HISTORY — PX: PEG PLACEMENT: SHX5437

## 2016-01-10 LAB — CBC
HCT: 28.7 % — ABNORMAL LOW (ref 40.0–52.0)
Hemoglobin: 9.5 g/dL — ABNORMAL LOW (ref 13.0–18.0)
MCH: 26.5 pg (ref 26.0–34.0)
MCHC: 33.2 g/dL (ref 32.0–36.0)
MCV: 79.9 fL — ABNORMAL LOW (ref 80.0–100.0)
PLATELETS: 155 10*3/uL (ref 150–440)
RBC: 3.59 MIL/uL — AB (ref 4.40–5.90)
RDW: 18.5 % — ABNORMAL HIGH (ref 11.5–14.5)
WBC: 6 10*3/uL (ref 3.8–10.6)

## 2016-01-10 LAB — GLUCOSE, CAPILLARY: GLUCOSE-CAPILLARY: 92 mg/dL (ref 65–99)

## 2016-01-10 LAB — BASIC METABOLIC PANEL
ANION GAP: 4 — AB (ref 5–15)
ANION GAP: 5 (ref 5–15)
BUN: 18 mg/dL (ref 6–20)
BUN: 17 mg/dL (ref 6–20)
CALCIUM: 8.2 mg/dL — AB (ref 8.9–10.3)
CHLORIDE: 105 mmol/L (ref 101–111)
CO2: 31 mmol/L (ref 22–32)
CO2: 32 mmol/L (ref 22–32)
CREATININE: 0.56 mg/dL — AB (ref 0.61–1.24)
Calcium: 8.2 mg/dL — ABNORMAL LOW (ref 8.9–10.3)
Chloride: 105 mmol/L (ref 101–111)
Creatinine, Ser: 0.56 mg/dL — ABNORMAL LOW (ref 0.61–1.24)
Glucose, Bld: 111 mg/dL — ABNORMAL HIGH (ref 65–99)
Glucose, Bld: 94 mg/dL (ref 65–99)
POTASSIUM: 3.6 mmol/L (ref 3.5–5.1)
Potassium: 3.5 mmol/L (ref 3.5–5.1)
SODIUM: 141 mmol/L (ref 135–145)
SODIUM: 141 mmol/L (ref 135–145)

## 2016-01-10 LAB — MAGNESIUM: MAGNESIUM: 1.5 mg/dL — AB (ref 1.7–2.4)

## 2016-01-10 LAB — PROTIME-INR
INR: 1.18
PROTHROMBIN TIME: 15.2 s — AB (ref 11.4–15.0)

## 2016-01-10 SURGERY — INSERTION, PEG TUBE
Anesthesia: General

## 2016-01-10 MED ORDER — JEVITY 1.5 CAL/FIBER PO LIQD: 120.0000 mL | Freq: Every day | ORAL | Status: DC

## 2016-01-10 MED ORDER — ENSURE ENLIVE PO LIQD
237.0000 mL | Freq: Four times a day (QID) | ORAL | Status: DC
  Administered 2016-01-11 (×2): 237 mL via ORAL

## 2016-01-10 MED ORDER — LIDOCAINE HCL (CARDIAC) 20 MG/ML IV SOLN
INTRAVENOUS | Status: DC | PRN
  Administered 2016-01-10: 100 mg via INTRAVENOUS

## 2016-01-10 MED ORDER — PROPOFOL 500 MG/50ML IV EMUL
INTRAVENOUS | Status: DC | PRN
  Administered 2016-01-10: 150 ug/kg/min via INTRAVENOUS

## 2016-01-10 MED ORDER — SODIUM CHLORIDE 0.9 % IV SOLN
INTRAVENOUS | Status: DC
Start: 2016-01-10 — End: 2016-01-10
  Administered 2016-01-10: 1000 mL via INTRAVENOUS

## 2016-01-10 MED ORDER — PROPOFOL 10 MG/ML IV BOLUS
INTRAVENOUS | Status: DC | PRN
  Administered 2016-01-10: 70 mg via INTRAVENOUS

## 2016-01-10 MED ORDER — LACTATED RINGERS IV SOLN
INTRAVENOUS | Status: DC | PRN
  Administered 2016-01-10: 07:00:00 via INTRAVENOUS

## 2016-01-10 NOTE — Progress Notes (Signed)
Nutrition Follow-up  DOCUMENTATION CODES:   Non-severe (moderate) malnutrition in context of chronic illness  INTERVENTION:  -Dietitian Consult received for TF this AM, discussed nutritional poc with MD Kalisetti and with pt.RD recommends that pt start Jevity 1.5 bolus feedings; discussed with pt. Pt is adamant that he not receive Jevity formula, pt prefers Ensure per the  Feeding tube. Pt reports he has been on Jevity in the past and does not like the way the Jevity "tastes" and reports it hurts his tummy. RD educated pt re: Jevity formula which is designed for sole source nutrition and better meet's pt nutritional needs  but pt still requests that he receive Ensure via PEG tube. Pt reports that prior to previous PEG removal, pt was receiving EnsurePlus 6 cans per day and he reports he tolerated well. Discussed with MD Tressia Miners as this is pt request; per MD, will order Ensure boluses (EnsureEnlive is on formulary but pt will need to use EnsurePlus as outpatient). Pt will need 6 per day providing 2100 kcals, 78 g of protein and 1080 mL of free water. Pt will need additional 1000 mL of free water flushes to meet. Bolus feedings to begin tomorrow at 8:00 am. Also discussed feeding plan with Care Management team. Continue to assess -Education: provided pt with "Tube Feeding at Home" booklet. Pt has hx of PEG tube in the past and has given himself bolus feedings at home previously; pt feels relatively confident with regards to feedings and PEG tube care as he has previously had a feeding tube   NUTRITION DIAGNOSIS:   Malnutrition related to chronic illness as evidenced by mild depletion of body fat, moderate depletions of muscle mass, energy intake < or equal to 75% for > or equal to 1 month.  Being addressed as PEG tube placed, starting TF  GOAL:   Patient will meet greater than or equal to 90% of their needs  MONITOR:   Diet advancement, PO intake, Labs, Weight trends  REASON FOR ASSESSMENT:    Consult Enteral/tube feeding initiation and management  ASSESSMENT:   70 yo male admitted with sepsis with aspiration pneumonia; pt with hx of dysphagia and Parkinson's Disease  Pt s/p PEG tube this AM, water flushes today, plan to start TF tomorrow, also noted plan for discharge tomorrow as well  Diet Order:  Diet NPO time specified Diet NPO time specified Diet NPO time specified  Skin:  Reviewed, no issues  Last BM:  no documented BM since admission   Labs: reviewed  Meds: D5-1/2 NS at 35 ml/hr  Height:   Ht Readings from Last 1 Encounters:  01/06/16 5\' 9"  (1.753 m)    Weight:   Wt Readings from Last 1 Encounters:  01/06/16 162 lb (73.483 kg)    Ideal Body Weight:     BMI:  Body mass index is 23.91 kg/(m^2).  Estimated Nutritional Needs:   Kcal:  2100-2500 kcals   Protein:  86-108 g   Fluid:  >/= 2 L  EDUCATION NEEDS:   No education needs identified at this time  [place], [place], [place] [phone] Pager  [phone] Weekend/On-Call Pager

## 2016-01-10 NOTE — Clinical Documentation Improvement (Signed)
Hospitalist  Please document query responses in the progress notes and discharge summary, not on the CDI BPA form in [facility]. Thank you!  "CHF" is documented in the past medical history.  Echo this admission (01/06/16)  EF 40-45%, mild concentric LVH, mild MR and TR.  Patient received 1 dose of IV Lasix this admission.  CXR x 3 this admission      Please document and clarify if the diagnosis of "CHF" is applicable to this admission.   If so, please document the Acuity and Type of Heart Failure monitored and evaluated this admission:  Acuity - Acute, Chronic, Acute on Chronic, Other Acuity, Unable to clinically determine  And   Type - Systolic, Diastolic, Combined, Other type, Unable to clinically determine   Please exercise your independent, professional judgment when responding. A specific answer is not anticipated or expected.   Thank You, Erling Conte  RN BSN CCDS [phone] Health Information Management [place]

## 2016-01-10 NOTE — Progress Notes (Signed)
Patient status post peg tube placement Dr. Tressia Miners stated to hold all morning meds.

## 2016-01-10 NOTE — Progress Notes (Signed)
ANTICOAGULATION CONSULT NOTE - Follow up [place] for Warfarin Dosing Indication: DVT  Allergies  Allergen Reactions  . Other Other (See Comments)    Dizziness, lightheadedness, Pt states that inhaled medications make him depressed and have negative thoughts.      Patient Measurements: Height: 5\' 9"  (175.3 cm) Weight: 162 lb (73.483 kg) IBW/kg (Calculated) : 70.7  Vital Signs: Temp: 97.2 F (36.2 C) (07/06 0648) BP: 159/96 mmHg (07/06 0648) Pulse Rate: 106 (07/06 0648)  Labs:  Recent Labs  01/08/16 0352 01/09/16 0404 01/09/16 2346 01/10/16 0422  HGB  --   --   --  9.5*  HCT  --   --   --  28.7*  PLT  --   --   --  155  LABPROT 46.0* 16.4*  --  15.2*  INR 5.15* 1.31  --  1.18  CREATININE  --   --  0.56* 0.56*    Estimated Creatinine Clearance: 85.9 mL/min (by C-G formula based on Cr of 0.56).   Assessment: Patient was taking Coumadin 5mg  daily prior to admission for history of DVT. Pt currently NPO and also on Unasyn at this time.   6/30 INR 3.06  None charted/ordered 7/1   INR 3.20  Hold dose 7/2   INR 4.40  Hold dose 7/3   INR 4.45  Hold dose 7/4   INR 5.15  Hold dose 7/5: INR: 1.31; patient received 5 mg of Vit K on 7/4.  7/6: INR 1.18; holding dose for PEG tube placement  Goal of Therapy:  INR 2-3 Monitor platelets by anticoagulation protocol: Yes   Plan:  Per MD Kalisetti holding warfarin therapy for PEG Placement, likely Friday 7/7.   Pharmacy will continue to follow.  Vena Rua, [facility] Clinical Pharmacist 01/10/2016, 7:25 AM

## 2016-01-10 NOTE — Transfer of Care (Signed)
Immediate Anesthesia Transfer of Care Note  Patient: John Mendoza  Procedure(s) Performed: Procedure(s): PERCUTANEOUS ENDOSCOPIC GASTROSTOMY (PEG) PLACEMENT (N/A)  Patient Location: Endoscopy Unit  Anesthesia Type:General  Level of Consciousness: awake  Airway & Oxygen Therapy: Patient Spontanous Breathing and Patient connected to nasal cannula oxygen  Post-op Assessment: Report given to RN and Post -op Vital signs reviewed and stable  Post vital signs: Reviewed and stable  Last Vitals:  Filed Vitals:   01/10/16 0407 01/10/16 0648  BP: 154/95 159/96  Pulse: 92 106  Temp: 36.6 C 36.2 C  Resp: 16 20    Last Pain:  Filed Vitals:   01/10/16 0656  PainSc: 4          Complications: No apparent anesthesia complications

## 2016-01-10 NOTE — Anesthesia Preprocedure Evaluation (Addendum)
Anesthesia Evaluation  Patient identified by MRN, date of birth, ID band Patient awake    Reviewed: Allergy & Precautions, H&P , NPO status , Patient's Chart, lab work & pertinent test results, reviewed documented beta blocker date and time   History of Anesthesia Complications Negative for: history of anesthetic complications  Airway Mallampati: III  TM Distance: >3 FB Neck ROM: full    Dental no notable dental hx. (+) Edentulous Upper, Edentulous Lower   Pulmonary shortness of breath and with exertion, sleep apnea , pneumonia, resolved, COPD,  COPD inhaler and oxygen dependent, neg recent URI, former smoker,    Pulmonary exam normal breath sounds clear to auscultation       Cardiovascular Exercise Tolerance: Good hypertension, (-) angina+ CAD, + Past MI, + Cardiac Stents (x3) and +CHF  (-) CABG Normal cardiovascular exam+ dysrhythmias Atrial Fibrillation (-) Valvular Problems/Murmurs Rhythm:regular Rate:Normal     Neuro/Psych Seizures -, Well Controlled,  PSYCHIATRIC DISORDERS (Depression)    GI/Hepatic Neg liver ROS, GERD  Medicated,  Endo/Other  diabetesHypothyroidism   Renal/GU Renal disease (Goodpasteur syndrome)  negative genitourinary   Musculoskeletal   Abdominal   Peds  Hematology  (+) Blood dyscrasia, anemia ,   Anesthesia Other Findings Past Medical History:   Barrett esophagus                                            Hypertension                                                 Hyperlipidemia                                               Multiple pulmonary nodules                                   CHF (congestive heart failure) (HCC)                         Lumbar spinal stenosis                                       COPD (chronic obstructive pulmonary disease) (*              CAD (coronary artery disease)                                  Comment:on 2l home oxygen   On home oxygen therapy                                        DVT (deep venous thrombosis) ([place])              11/2013; 7*     Comment:on coumadin   Parkinson's disease ([place])  Recurrent aspiration pneumonia (HCC)                         Sleep apnea                                                  Hypothyroidism                                               Type II diabetes mellitus (HCC)                              Iron deficiency anemia                                       GERD (gastroesophageal reflux disease)                       Seizures (HCC)                                               DJD (degenerative joint disease)                             Arthritis                                                    Chronic lower back pain                                      History of gout                                              Depression                                                   Anxiety                                                      Basal cell carcinoma                                         Throat cancer ([place])  Heart attack ([place])                              03/20/1995    Goodpasture syndrome (HCC)                                     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   Dental Advisory Given  Plan  Discussed with: Anesthesiologist, CRNA and Surgeon  Anesthesia Plan Comments:         Anesthesia Quick Evaluation

## 2016-01-10 NOTE — Op Note (Signed)
[facility] Gastroenterology Patient Name: John Mendoza Procedure Date: 01/10/2016 6:54 AM MRN: [id number] Account #: [id number] Date of Birth: Apr 07, 1946 Admit Type: Inpatient Age: 70 Room: [facility] ENDO ROOM 1 Gender: Male Note Status: Finalized Procedure:            Upper GI endoscopy Indications:          Dysphagia Providers:            Lucilla Lame, MD Referring MD:         Ramonita Lab, MD (Referring MD) Medicines:            Propofol per Anesthesia Complications:        No immediate complications. Procedure:            Pre-Anesthesia Assessment:                       - Prior to the procedure, a History and Physical was                        performed, and patient medications and allergies were                        reviewed. The patient's tolerance of previous                        anesthesia was also reviewed. The risks and benefits of                        the procedure and the sedation options and risks were                        discussed with the patient. All questions were                        answered, and informed consent was obtained. Prior                        Anticoagulants: The patient has taken no previous                        anticoagulant or antiplatelet agents. ASA Grade                        Assessment: II - A patient with mild systemic disease.                        After reviewing the risks and benefits, the patient was                        deemed in satisfactory condition to undergo the                        procedure.                       After obtaining informed consent, the endoscope was                        passed under direct vision. Throughout the procedure,  the patient's blood pressure, pulse, and oxygen                        saturations were monitored continuously. The Endoscope                        was introduced through the mouth, and advanced to the                        second part of  duodenum. The upper GI endoscopy was                        accomplished without difficulty. The patient tolerated                        the procedure well. Findings:      The examined esophagus was normal.      A scar was found in the gastric body.      The examined duodenum was normal.      Placement of an externally removable PEG with no T-fasteners was       successfully completed. The external bumper was at the 3.0 cm marking on       the tube. Impression:           - Normal esophagus.                       - Scar in the gastric body.                       - Normal examined duodenum.                       - An externally removable PEG placement was                        successfully completed.                       - No specimens collected. Recommendation:       - Please follow the post-PEG recommendations including:                        Nutrition consult for formula and volume, change                        dressing once per day and NPO x4 hrs then water today. Procedure Code(s):    --- Professional ---                       [phone], Esophagogastroduodenoscopy, flexible, transoral;                        with directed placement of percutaneous gastrostomy tube Diagnosis Code(s):    --- Professional ---                       R13.10, Dysphagia, unspecified CPT copyright 2016 American Medical Association. All rights reserved. The codes documented in this report are preliminary and upon coder review may  be revised to meet current compliance requirements. Lucilla Lame, MD 01/10/2016 7:41:50 AM This report has been signed electronically. Number of Addenda: 0  Note Initiated On: 01/10/2016 6:54 AM      [facility]

## 2016-01-10 NOTE — Care Management (Signed)
Informed that patient is requesting to use Ensure instead of Jevity. Question as to whether Ensure would be covered rather than jevity.  Per Corene Cornea with [employer], this supplement would be covered by patient's insurance if it is the sole nutrition patient will be receiving and or up to 75% of his nutrition.

## 2016-01-10 NOTE — Progress Notes (Signed)
Had PEG tube placement today,may use for medication administration and tube feeding tomorrow.Hemodynasmics within normal limits.

## 2016-01-10 NOTE — Care Management (Addendum)
Case discussed with attending. PEG has been placed. Plan is home tomorrow with home health SN, PT and HHA. [facility] updated. They will provide bolus feeds for patient and teaching.

## 2016-01-10 NOTE — Progress Notes (Signed)
[place] [facility] [place] NAME: John Mendoza    MR#:  [id number]  DATE OF BIRTH:  1945-09-01  SUBJECTIVE:  CHIEF COMPLAINT:   Chief Complaint  Patient presents with  . Pleurisy    possible sepsis  . Code Sepsis   - PEG tube placed today - doing well, no complaints  REVIEW OF SYSTEMS:  Review of Systems  Constitutional: Negative for fever, chills and malaise/fatigue.  HENT: Negative for ear discharge, ear pain and nosebleeds.   Eyes: Negative for blurred vision and double vision.  Respiratory: Negative for cough, shortness of breath and wheezing.   Cardiovascular: Negative for chest pain, palpitations and leg swelling.  Gastrointestinal: Negative for nausea, vomiting, abdominal pain, diarrhea and constipation.  Genitourinary: Negative for dysuria.  Musculoskeletal: Negative for myalgias.  Neurological: Negative for dizziness, sensory change, speech change, focal weakness, seizures and headaches.  Psychiatric/Behavioral: Negative for depression.    DRUG ALLERGIES:   Allergies  Allergen Reactions  . Other Other (See Comments)    Dizziness, lightheadedness, Pt states that inhaled medications make him depressed and have negative thoughts.      VITALS:  Blood pressure 134/84, pulse 88, temperature 98.5 F (36.9 C), temperature source Oral, resp. rate 12, height 5\' 9"  (1.753 m), weight 73.483 kg (162 lb), SpO2 99 %.  PHYSICAL EXAMINATION:  Physical Exam  GENERAL:  70 y.o.-year-old patient lying in the bed with no acute distress.  EYES: Pupils equal, round, reactive to light and accommodation. No scleral icterus. Extraocular muscles intact.  HEENT: Head atraumatic, normocephalic. Oropharynx and nasopharynx clear.  NECK:  Supple, no jugular venous distention. No thyroid enlargement, no tenderness.  LUNGS: Normal breath sounds bilaterally, no wheezing, rales,rhonchi or crepitation. No use of accessory muscles of respiration.   CARDIOVASCULAR: S1, S2 normal. No murmurs, rubs, or gallops.  ABDOMEN: Soft, nontender, nondistended. Bowel sounds present. No organomegaly or mass. PEG tube in place EXTREMITIES: No pedal edema, cyanosis, or clubbing.  NEUROLOGIC: Cranial nerves II through XII are intact. Muscle strength 4/5 in all extremities. Sensation intact. Gait not checked.  PSYCHIATRIC: The patient is alert and oriented x 3. SKIN: No obvious rash, lesion, or ulcer.    LABORATORY PANEL:   CBC  Recent Labs Lab 01/10/16 0422  WBC 6.0  HGB 9.5*  HCT 28.7*  PLT 155   ------------------------------------------------------------------------------------------------------------------  Chemistries   Recent Labs Lab 01/04/16 2003  01/09/16 2346 01/10/16 0422  NA 134*  < > 141 141  K 4.0  < > 3.6 3.5  CL 95*  < > 105 105  CO2 30  < > 31 32  GLUCOSE 160*  < > 111* 94  BUN 22*  < > 18 17  CREATININE 0.91  < > 0.56* 0.56*  CALCIUM 9.2  < > 8.2* 8.2*  MG  --   < > 1.5*  --   AST 18  --   --   --   ALT 10*  --   --   --   ALKPHOS 69  --   --   --   BILITOT 0.7  --   --   --   < > = values in this interval not displayed. ------------------------------------------------------------------------------------------------------------------  Cardiac Enzymes  Recent Labs Lab 01/06/16 2034  TROPONINI 0.04*   ------------------------------------------------------------------------------------------------------------------  RADIOLOGY:  No results found.  EKG:   Orders placed or performed during the hospital encounter of 01/04/16  . EKG 12-Lead  . EKG 12-Lead  .  ED EKG  . ED EKG  . EKG 12-Lead  . EKG 12-Lead    ASSESSMENT AND PLAN:   70 year old male with past medical history significant for Parkinson's disease, COPD on 2 L home oxygen, history of DVT on Coumadin, seizure disorder, arthritis, CHF, CAD, history of throat cancer status post radiation presents to the hospital secondary to recurrent  aspiration pneumonia.  #1 sepsis-secondary to aspiration pneumonia. -Recurrent aspiration history. PEG tube in the past was taken out. -Currently on Unasyn.  Start on Oral Antibiotics tomorrow. -Will Need 2 Weeks of antibiotics with positive blood cultures.  #2 recurrent aspiration pneumonia- secondary to Parkinson's disease and also radiation to throat cancer. -PEG tube placed today- tube feeds from tomorrow- dietitian consult -Appreciate Dr. Dorothey Baseman help  #3 hypothyroidism- continue synthroid  #4 hypertension-on metoprolol.  #5 COPD-  Chronically on 2L o2, was needing Bipap on admission- improved to 3L now - appears stable - inh   #6 DVT Prophylaxis- will start lovenox- was on coumadin for DVT as outpatient- restart at discharge as PEG tube placed today  Discharge likely tomorrow if no complications    All the records are reviewed and case discussed with Care Management/Social Workerr. Management plans discussed with the patient, family and they are in agreement.  CODE STATUS: Full Code  TOTAL TIME TAKING CARE OF THIS PATIENT: 38 minutes.   POSSIBLE D/C IN 1 DAY, DEPENDING ON CLINICAL CONDITION.   John Mendoza M.D on 01/10/2016 at 1:58 PM  Between 7am to 6pm - Pager - [phone]  After 6pm go to [email or web address] - password [email or web address] [facility]  [place] [facility]  Office  [phone]  CC: Primary care physician; John Hector, MD

## 2016-01-10 NOTE — Anesthesia Postprocedure Evaluation (Signed)
Anesthesia Post Note  Patient: ORION KLOPF  Procedure(s) Performed: Procedure(s) (LRB): PERCUTANEOUS ENDOSCOPIC GASTROSTOMY (PEG) PLACEMENT (N/A)  Patient location during evaluation: Endoscopy Anesthesia Type: General Level of consciousness: awake and alert Pain management: pain level controlled Vital Signs Assessment: post-procedure vital signs reviewed and stable Respiratory status: spontaneous breathing, nonlabored ventilation, respiratory function stable and patient connected to nasal cannula oxygen Cardiovascular status: blood pressure returned to baseline and stable Postop Assessment: no signs of nausea or vomiting Anesthetic complications: no    Last Vitals:  Filed Vitals:   01/10/16 0805 01/10/16 0810  BP: 106/66 106/66  Pulse: 42 80  Temp:    Resp: 11 11    Last Pain:  Filed Vitals:   01/10/16 0812  PainSc: 4                  Martha Clan

## 2016-01-11 ENCOUNTER — Encounter
Admission: EM | Disposition: A | Discharge status: Home / Self Care | Payer: Self-pay | Source: Home / Self Care | Attending: Internal Medicine

## 2016-01-11 ENCOUNTER — Encounter: Payer: Self-pay | Admitting: Gastroenterology

## 2016-01-11 LAB — BASIC METABOLIC PANEL WITH GFR
Anion gap: 4 — ABNORMAL LOW (ref 5–15)
BUN: 13 mg/dL (ref 6–20)
CO2: 32 mmol/L (ref 22–32)
Calcium: 8 mg/dL — ABNORMAL LOW (ref 8.9–10.3)
Chloride: 104 mmol/L (ref 101–111)
Creatinine, Ser: 0.59 mg/dL — ABNORMAL LOW (ref 0.61–1.24)
GFR calc Af Amer: >60 mL/min
GFR calc non Af Amer: >60 mL/min
Glucose, Bld: 74 mg/dL (ref 65–99)
Potassium: 3.4 mmol/L — ABNORMAL LOW (ref 3.5–5.1)
Sodium: 140 mmol/L (ref 135–145)

## 2016-01-11 LAB — PROTIME-INR
INR: 1.14
Prothrombin Time: 14.8 s (ref 11.4–15.0)

## 2016-01-11 SURGERY — INSERTION, PEG TUBE
Anesthesia: Monitor Anesthesia Care

## 2016-01-11 MED ORDER — CEFUROXIME AXETIL 500 MG PO TABS: 500.0000 mg | ORAL_TABLET | Freq: Two times a day (BID) | ORAL | Status: DC

## 2016-01-11 MED ORDER — ENSURE ENLIVE PO LIQD: 237.0000 mL | ORAL | Status: DC

## 2016-01-11 NOTE — Progress Notes (Signed)
Pt discharged to home via wc.  Instructions  given to pt, RX for Ceftin escribed to [place].  Questions answered.  No distress.

## 2016-01-11 NOTE — Discharge Summary (Signed)
[facility] [place] NAME: John Mendoza    MR#:  [id number]  DATE OF BIRTH:  1946-03-12  DATE OF ADMISSION:  01/04/2016 ADMITTING PHYSICIAN: Lance Coon, MD  DATE OF DISCHARGE: 01/11/2016  PRIMARY CARE PHYSICIAN: Tama High III, MD    ADMISSION DIAGNOSIS:  Aspiration pneumonia of both lower lobes, unspecified aspiration pneumonia type ([place]) [J69.0]  DISCHARGE DIAGNOSIS:  Principal Problem:   Sepsis ([place]) Active Problems:   Hypertension   CAD (coronary artery disease)   Parkinsons disease ([place])   Aspiration pneumonia ([place])   Hypothyroidism   SECONDARY DIAGNOSIS:   Past Medical History  Diagnosis Date  . Barrett esophagus   . Hypertension   . Hyperlipidemia   . Multiple pulmonary nodules   . CHF (congestive heart failure) ([place])   . Lumbar spinal stenosis   . COPD (chronic obstructive pulmonary disease) ([place])   . CAD (coronary artery disease)     on 2l home oxygen  . On home oxygen therapy   . DVT (deep venous thrombosis) ([place]) 11/2013; 02/01/2015    on coumadin  . Parkinson's disease ([place])   . Recurrent aspiration pneumonia ([place])   . Sleep apnea   . Hypothyroidism   . Type II diabetes mellitus ([place])   . Iron deficiency anemia   . GERD (gastroesophageal reflux disease)   . Seizures ([place])   . DJD (degenerative joint disease)   . Arthritis   . Chronic lower back pain   . History of gout   . Depression   . Anxiety   . Basal cell carcinoma   . Throat cancer ([place])   . Heart attack ([place]) 03/20/1995  . 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  #1  sepsis-secondary to aspiration pneumonia. -Recurrent aspiration history. PEG tube in the past was taken out. -was on Unasyn in the hospital. will be discharged on ceftin for positive klebsiella blood cultures on admission. -Will Need 2 Weeks of antibiotics with positive blood cultures.  #2 recurrent aspiration pneumonia- secondary to Parkinson's disease and also radiation to throat cancer. -PEG tube placed- tube feeds started- bolus feeds- dietitian consult appreciated -Appreciate Dr. Dorothey Baseman help for PEG placement  #3 hypothyroidism- continue synthroid  #4 hypertension-on bisoprolol and low dose losartan.  #5 COPD- Chronically on 2L o2, was needing Bipap on admission- improved to 2-3L o2 now - appears stable -cont inh   #6 chronic systolic CHF-EF of [id number]. We'll compensated at this time. Received 1 dose of Lasix in the hospital course. No acute exacerbation.   Will be discharged home today.  DISCHARGE CONDITIONS:   Guarded  CONSULTS OBTAINED:  Treatment Team:  Laverle Hobby, MD Lollie Sails, MD Lucilla Lame, MD  DRUG ALLERGIES:   Allergies  Allergen Reactions  . Other Other (See Comments)    Dizziness, lightheadedness, Pt states that inhaled medications make him depressed and have negative thoughts.      DISCHARGE MEDICATIONS:   Current Discharge Medication List    START taking these medications   Details  cefUROXime (CEFTIN) 500 MG tablet Take 1 tablet (500 mg total) by mouth 2 (two) times daily with  a meal. X 7 more days Qty: 14 tablet, Refills: 0      CONTINUE these medications which have NOT CHANGED   Details  acetaminophen (TYLENOL) 500 MG tablet Take 500 mg by mouth every 6 (six) hours as needed.    ARIPiprazole (ABILIFY) 5 MG tablet Take 5 mg by mouth 2 (two) times daily.    Artificial Tear Ointment (DRY EYES OP) Place 1 drop into both eyes daily as needed (dry eyes).    aspirin EC 81 MG tablet Take 81 mg by mouth at bedtime.      atorvastatin (LIPITOR) 40 MG tablet Take 40 mg by mouth at bedtime.    bisoprolol (ZEBETA) 10 MG tablet Take 10 mg by mouth daily.    carbidopa-levodopa (SINEMET IR) 25-250 MG tablet Take 2.5 tablets by mouth 3 (three) times daily.     clonazePAM (KLONOPIN) 1 MG tablet Take 1 mg by mouth at bedtime.    fentaNYL (DURAGESIC - DOSED MCG/HR) 50 MCG/HR Place 1 patch (50 mcg total) onto the skin every 3 (three) days. Qty: 10 patch, Refills: 0    HYDROcodone-acetaminophen (NORCO/VICODIN) 5-325 MG tablet Take 0.5 tablets by mouth every 8 (eight) hours as needed for moderate pain.     lamoTRIgine (LAMICTAL) 25 MG tablet Take 25 mg by mouth 2 (two) times daily.    levothyroxine (SYNTHROID, LEVOTHROID) 137 MCG tablet Take 137 mcg by mouth daily.    losartan (COZAAR) 25 MG tablet Take 25 mg by mouth at bedtime.    metFORMIN (GLUCOPHAGE) 500 MG tablet Take 500 mg by mouth 2 (two) times daily.    nitroGLYCERIN (NITROSTAT) 0.4 MG SL tablet Place 0.4 mg under the tongue every 5 (five) minutes x 3 doses as needed for chest pain.     omeprazole (PRILOSEC) 20 MG capsule Take 20 mg by mouth 2 (two) times daily before a meal.     PARoxetine (PAXIL) 20 MG tablet Take 60 mg by mouth daily.     tamsulosin (FLOMAX) 0.4 MG CAPS Take 0.4 mg by mouth daily.     tiZANidine (ZANAFLEX) 4 MG tablet Take 4 mg by mouth every 8 (eight) hours as needed for muscle spasms.    triamcinolone cream (KENALOG) 0.5 % Apply 1 application topically 2 (two) times daily as needed (rash).     warfarin (COUMADIN) 5 MG tablet Take 1 tablet (5 mg total) by mouth daily. Qty: 30 tablet, Refills: 11    bisacodyl (DULCOLAX) 10 MG suppository Place 1 suppository (10 mg total) rectally daily as needed for moderate constipation. Qty: 12 suppository, Refills: 0         DISCHARGE INSTRUCTIONS:   1. PCP follow-up in 1-2 weeks 2. Home health- tube feeding instructions- ensure plus bolus feeds 6 cans/day 250cc free water flushes  QID for now   If you experience worsening of your admission symptoms, develop shortness of breath, life threatening emergency, suicidal or homicidal thoughts you must seek medical attention immediately by calling 911 or calling your MD immediately  if symptoms less severe.  You Must read complete instructions/literature along with all the possible adverse reactions/side effects for all the Medicines you take and that have been prescribed to you. Take any new Medicines after you have completely understood and accept all the possible adverse reactions/side effects.   Please note  You were cared for by a hospitalist during your hospital stay. If you have any questions about your discharge medications or the care you received while you were in  the hospital after you are discharged, you can call the unit and asked to speak with the hospitalist on call if the hospitalist that took care of you is not available. Once you are discharged, your primary care physician will handle any further medical issues. Please note that NO REFILLS for any discharge medications will be authorized once you are discharged, as it is imperative that you return to your primary care physician (or establish a relationship with a primary care physician if you do not have one) for your aftercare needs so that they can reassess your need for medications and monitor your lab values.    Today   CHIEF COMPLAINT:   Chief Complaint  Patient presents with  . Pleurisy    possible sepsis  . Code Sepsis    VITAL SIGNS:  Blood pressure 98/58, pulse 109, temperature 97.9 F (36.6 C), temperature source Oral, resp. rate 20, height 5\' 9"  (1.753 m), weight 73.483 kg (162 lb), SpO2 93 %.  I/O:   Intake/Output Summary (Last 24 hours) at 01/11/16 1358 Last data filed at 01/11/16 1220  Gross per 24 hour  Intake    483 ml  Output    590 ml  Net   -107 ml    PHYSICAL EXAMINATION:   Physical Exam  GENERAL: 70 y.o.-year-old  patient lying in the bed with no acute distress.  EYES: Pupils equal, round, reactive to light and accommodation. No scleral icterus. Extraocular muscles intact.  HEENT: Head atraumatic, normocephalic. Oropharynx and nasopharynx clear.  NECK: Supple, no jugular venous distention. No thyroid enlargement, no tenderness.  LUNGS: Normal breath sounds bilaterally, no wheezing, rales,rhonchi or crepitation. No use of accessory muscles of respiration.  CARDIOVASCULAR: S1, S2 normal. No murmurs, rubs, or gallops.  ABDOMEN: Soft, nontender, nondistended. Bowel sounds present. No organomegaly or mass. PEG tube in place EXTREMITIES: No pedal edema, cyanosis, or clubbing.  NEUROLOGIC: Cranial nerves II through XII are intact. Muscle strength 4/5 in all extremities. Sensation intact. Gait not checked.  PSYCHIATRIC: The patient is alert and oriented x 3. SKIN: No obvious rash, lesion, or ulcer.   DATA REVIEW:   CBC  Recent Labs Lab 01/10/16 0422  WBC 6.0  HGB 9.5*  HCT 28.7*  PLT 155    Chemistries   Recent Labs Lab 01/04/16 2003  01/09/16 2346  01/11/16 0317  NA 134*  < > 141  < > 140  K 4.0  < > 3.6  < > 3.4*  CL 95*  < > 105  < > 104  CO2 30  < > 31  < > 32  GLUCOSE 160*  < > 111*  < > 74  BUN 22*  < > 18  < > 13  CREATININE 0.91  < > 0.56*  < > 0.59*  CALCIUM 9.2  < > 8.2*  < > 8.0*  MG  --   < > 1.5*  --   --   AST 18  --   --   --   --   ALT 10*  --   --   --   --   ALKPHOS 69  --   --   --   --   BILITOT 0.7  --   --   --   --   < > = values in this interval not displayed.  Cardiac Enzymes  Recent Labs Lab 01/06/16 2034  TROPONINI 0.04*    Microbiology Results  Results for orders placed or performed during  the hospital encounter of 01/04/16  Blood Culture (routine x 2)     Status: None   Collection Time: 01/04/16  8:10 PM  Result Value Ref Range Status   Specimen Description BLOOD L AC  Final   Special Requests BOTTLES DRAWN AEROBIC AND ANAEROBIC 6CC   Final   Culture NO GROWTH 5 DAYS  Final   Report Status 01/09/2016 FINAL  Final  Blood Culture (routine x 2)     Status: Abnormal   Collection Time: 01/04/16  8:25 PM  Result Value Ref Range Status   Specimen Description   Final    BLOOD BLOOD LEFT FOREARM Performed at [facility]    Special Requests BOTTLES DRAWN AEROBIC AND ANAEROBIC 10CC  Final   Culture  Setup Time   Final    GRAM NEGATIVE RODS ANAEROBIC BOTTLE ONLY CRITICAL RESULT CALLED TO, READ BACK BY AND VERIFIED WITH: JODIE BAREFOOT @ 1114 01/05/16 BY [place]    Culture KLEBSIELLA PNEUMONIAE (A)  Final   Report Status 01/07/2016 FINAL  Final   Organism ID, Bacteria KLEBSIELLA PNEUMONIAE  Final      Susceptibility   Klebsiella pneumoniae - MIC*    AMPICILLIN 16 RESISTANT Resistant     CEFAZOLIN <=4 SENSITIVE Sensitive     CEFEPIME <=1 SENSITIVE Sensitive     CEFTAZIDIME <=1 SENSITIVE Sensitive     CEFTRIAXONE <=1 SENSITIVE Sensitive     CIPROFLOXACIN <=0.25 SENSITIVE Sensitive     GENTAMICIN <=1 SENSITIVE Sensitive     IMIPENEM <=0.25 SENSITIVE Sensitive     TRIMETH/SULFA <=20 SENSITIVE Sensitive     AMPICILLIN/SULBACTAM 4 SENSITIVE Sensitive     PIP/TAZO <=4 SENSITIVE Sensitive     Extended ESBL NEGATIVE Sensitive     * KLEBSIELLA PNEUMONIAE  Blood Culture ID Panel (Reflexed)     Status: Abnormal   Collection Time: 01/04/16  8:25 PM  Result Value Ref Range Status   Enterococcus species NOT DETECTED NOT DETECTED Final   Vancomycin resistance NOT DETECTED NOT DETECTED Final   Listeria monocytogenes NOT DETECTED NOT DETECTED Final   Staphylococcus species NOT DETECTED NOT DETECTED Final   Staphylococcus aureus NOT DETECTED NOT DETECTED Final   Methicillin resistance NOT DETECTED NOT DETECTED Final   Streptococcus species NOT DETECTED NOT DETECTED Final   Streptococcus agalactiae NOT DETECTED NOT DETECTED Final   Streptococcus pneumoniae NOT DETECTED NOT DETECTED Final   Streptococcus pyogenes NOT DETECTED NOT  DETECTED Final   Acinetobacter baumannii NOT DETECTED NOT DETECTED Final   Enterobacteriaceae species DETECTED (A) NOT DETECTED Final    Comment: CRITICAL RESULT CALLED TO, READ BACK BY AND VERIFIED WITH: Jodie Barefoot @ [id number] 01/05/16 by [place]    Enterobacter cloacae complex NOT DETECTED NOT DETECTED Final   Escherichia coli NOT DETECTED NOT DETECTED Final   Klebsiella oxytoca NOT DETECTED NOT DETECTED Final   Klebsiella pneumoniae DETECTED (A) NOT DETECTED Final    Comment: CRITICAL RESULT CALLED TO, READ BACK BY AND VERIFIED WITH: Jodie Barefoot @ [id number] 01/05/16 by [place]    Proteus species NOT DETECTED NOT DETECTED Final   Serratia marcescens NOT DETECTED NOT DETECTED Final   Carbapenem resistance NOT DETECTED NOT DETECTED Final   Haemophilus influenzae NOT DETECTED NOT DETECTED Final   Neisseria meningitidis NOT DETECTED NOT DETECTED Final   Pseudomonas aeruginosa NOT DETECTED NOT DETECTED Final   Candida albicans NOT DETECTED NOT DETECTED Final   Candida glabrata NOT DETECTED NOT DETECTED Final   Candida krusei NOT DETECTED NOT DETECTED Final  Candida parapsilosis NOT DETECTED NOT DETECTED Final   Candida tropicalis NOT DETECTED NOT DETECTED Final  Urine culture     Status: None   Collection Time: 01/04/16 10:08 PM  Result Value Ref Range Status   Specimen Description URINE, CLEAN CATCH  Final   Special Requests NONE  Final   Culture NO GROWTH Performed at [facility]   Final   Report Status 01/06/2016 FINAL  Final    RADIOLOGY:  No results found.  EKG:   Orders placed or performed during the hospital encounter of 01/04/16  . EKG 12-Lead  . EKG 12-Lead  . ED EKG  . ED EKG  . EKG 12-Lead  . EKG 12-Lead      Management plans discussed with the patient, family and they are in agreement.  CODE STATUS:     Code Status Orders        Start     Ordered   01/04/16 2323  Full code   Continuous     01/04/16 2322    Code Status History    Date Active Date  Inactive Code Status Order ID Comments User Context   12/11/2015  5:09 PM 12/13/2015  4:53 PM Full Code [id number]  Fritzi Mandes, MD Inpatient   11/29/2015  9:13 PM 12/01/2015  1:58 PM Full Code [id number]  Gladstone Lighter, MD Inpatient   03/06/2015 10:40 AM 03/08/2015  2:22 PM Full Code [id number]  Adin Hector, MD Inpatient   02/01/2015  5:36 PM 02/08/2015  6:46 PM Full Code [id number]  Samella Parr, NP Inpatient   01/26/2015 10:05 PM 02/01/2015  5:36 PM Full Code [id number]  Demetrios Loll, MD Inpatient      TOTAL TIME TAKING CARE OF THIS PATIENT: 37 minutes.    Gladstone Lighter M.D on 01/11/2016 at 1:58 PM  Between 7am to 6pm - Pager - [phone]  After 6pm go to [email or web address] - password [email or web address] [facility]  [place] [facility]  Office  [phone]  CC: Primary care physician; Adin Hector, MD

## 2016-01-11 NOTE — Discharge Instructions (Signed)
1. Tube feeds - 6 cans of ensure plus per day 2. Free water flushes 250cc- QID 3. Suction - oral PRN for increased secretions 4. Sitting upright for feedings, aspiration precautions

## 2016-01-11 NOTE — Progress Notes (Signed)
ANTICOAGULATION CONSULT NOTE - Follow up [place] [facility] for Warfarin Dosing Indication: DVT  Allergies  Allergen Reactions  . Other Other (See Comments)    Dizziness, lightheadedness, Pt states that inhaled medications make him depressed and have negative thoughts.      Patient Measurements: Height: 5\' 9"  (175.3 cm) Weight: 162 lb (73.483 kg) IBW/kg (Calculated) : 70.7  Vital Signs: Temp: 97.9 F (36.6 C) (07/07 0739) Temp Source: Oral (07/07 0739) BP: 157/86 mmHg (07/07 0739) Pulse Rate: 101 (07/07 0739)  Labs:  Recent Labs  01/09/16 0404 01/09/16 2346 01/10/16 0422 01/11/16 0317  HGB  --   --  9.5*  --   HCT  --   --  28.7*  --   PLT  --   --  155  --   LABPROT 16.4*  --  15.2* 14.8  INR 1.31  --  1.18 1.14  CREATININE  --  0.56* 0.56* 0.59*    Estimated Creatinine Clearance: 85.9 mL/min (by C-G formula based on Cr of 0.59).   Assessment: Patient was taking Coumadin 5mg  daily prior to admission for history of DVT. Pt currently NPO and also on Unasyn at this time.   6/30 INR 3.06  None charted/ordered 7/1   INR 3.20  Hold dose 7/2   INR 4.40  Hold dose 7/3   INR 4.45  Hold dose 7/4   INR 5.15  Hold dose 7/5: INR: 1.31; patient received 5 mg of Vit K on 7/4.  7/6: INR 1.18; holding dose for PEG tube placement 7/7: INR 1.14; will likely be discharged today with warfarin restarted prior to discharge  Goal of Therapy:  INR 2-3 Monitor platelets by anticoagulation protocol: Yes   Plan:  Patient received PEG tube yesterday, will likely be discharged today with warfarin initiated prior.   Pharmacy will continue to follow.  Vena Rua, [facility] Clinical Pharmacist 01/11/2016, 8:24 AM

## 2016-01-11 NOTE — Care Management (Signed)
Notified [facility] of nutrition order.

## 2016-01-11 NOTE — Progress Notes (Signed)
Brief Nutrition Note:   Pt tolerated bolus feeding of 1 can of Ensure Enlive this AM; plan is for discharge today, pt to use Ensure Plus as TF at discharge. Pt reports he has no questions regarding tube feeding; pt has "Tube Feeding at home" booklet at bedside.   Nutrition Goal is 6 cans of Ensure Plus daily with additional free water flushes of 250-300 mL of free water 3-4 times daily for hydration  Nutrition Prescription discussed with MD Tressia Miners and pt; provided pt with recommended feeding prescription and suggested schedule for feedings, recommend free water flushes, etc. Pt verbalized understanding. Prescription for Ensure Plus has been written by MD and given to Care Management team.    [place] to provided pt with nutritional supplies; per care management, Ensure Plus will be covered by pt insurance. Encouraged pt to notify MD and/or [place] if any issues with feedings; pt assures writer that he will have no issues tolerating Ensure Plus boluses as he has been on this in the past and tolerated well. Pt may benefit from fiber supplement as Ensure Plus contains no fiber.   Kerman Passey [place], [place], LDN [phone] Pager  [phone] Weekend/On-Call Pager

## 2016-01-11 NOTE — Care Management Important Message (Signed)
Important Message  Patient Details  Name: John Mendoza MRN: [id number] Date of Birth: 02/21/1946   Medicare Important Message Given:  Yes    Juliann Pulse A Syd Manges 01/11/2016, 11:02 AM

## 2016-01-11 NOTE — Care Management (Signed)
Order for home oral suction machine received. Ordered from [facility]. Wife updated on discharge plan.

## 2016-01-12 ENCOUNTER — Emergency Department: Payer: Medicare Other

## 2016-01-12 ENCOUNTER — Emergency Department
Admission: EM | Admit: 2016-01-12 | Discharge: 2016-01-12 | Disposition: A | Discharge status: Home / Self Care | Payer: Medicare Other | Attending: Emergency Medicine | Admitting: Emergency Medicine

## 2016-01-12 ENCOUNTER — Encounter: Payer: Self-pay | Admitting: Emergency Medicine

## 2016-01-12 DIAGNOSIS — F329 Major depressive disorder, single episode, unspecified: Secondary | ICD-10-CM | POA: Insufficient documentation

## 2016-01-12 DIAGNOSIS — I509 Heart failure, unspecified: Secondary | ICD-10-CM | POA: Insufficient documentation

## 2016-01-12 DIAGNOSIS — E039 Hypothyroidism, unspecified: Secondary | ICD-10-CM | POA: Diagnosis not present

## 2016-01-12 DIAGNOSIS — Z87891 Personal history of nicotine dependence: Secondary | ICD-10-CM | POA: Insufficient documentation

## 2016-01-12 DIAGNOSIS — L03221 Cellulitis of neck: Secondary | ICD-10-CM | POA: Diagnosis not present

## 2016-01-12 DIAGNOSIS — Z7901 Long term (current) use of anticoagulants: Secondary | ICD-10-CM | POA: Insufficient documentation

## 2016-01-12 DIAGNOSIS — I110 Hypertensive heart disease with heart failure: Secondary | ICD-10-CM | POA: Insufficient documentation

## 2016-01-12 DIAGNOSIS — Z7982 Long term (current) use of aspirin: Secondary | ICD-10-CM | POA: Insufficient documentation

## 2016-01-12 DIAGNOSIS — R091 Pleurisy: Secondary | ICD-10-CM | POA: Insufficient documentation

## 2016-01-12 DIAGNOSIS — E119 Type 2 diabetes mellitus without complications: Secondary | ICD-10-CM | POA: Insufficient documentation

## 2016-01-12 DIAGNOSIS — Z86718 Personal history of other venous thrombosis and embolism: Secondary | ICD-10-CM | POA: Diagnosis not present

## 2016-01-12 DIAGNOSIS — J449 Chronic obstructive pulmonary disease, unspecified: Secondary | ICD-10-CM | POA: Diagnosis not present

## 2016-01-12 DIAGNOSIS — R0602 Shortness of breath: Secondary | ICD-10-CM | POA: Diagnosis present

## 2016-01-12 DIAGNOSIS — E785 Hyperlipidemia, unspecified: Secondary | ICD-10-CM | POA: Diagnosis not present

## 2016-01-12 DIAGNOSIS — I2510 Atherosclerotic heart disease of native coronary artery without angina pectoris: Secondary | ICD-10-CM | POA: Insufficient documentation

## 2016-01-12 DIAGNOSIS — Z951 Presence of aortocoronary bypass graft: Secondary | ICD-10-CM | POA: Insufficient documentation

## 2016-01-12 DIAGNOSIS — Z955 Presence of coronary angioplasty implant and graft: Secondary | ICD-10-CM | POA: Diagnosis not present

## 2016-01-12 DIAGNOSIS — Z85818 Personal history of malignant neoplasm of other sites of lip, oral cavity, and pharynx: Secondary | ICD-10-CM | POA: Diagnosis not present

## 2016-01-12 DIAGNOSIS — G20 Parkinson's disease: Secondary | ICD-10-CM | POA: Insufficient documentation

## 2016-01-12 DIAGNOSIS — M1990 Unspecified osteoarthritis, unspecified site: Secondary | ICD-10-CM | POA: Insufficient documentation

## 2016-01-12 DIAGNOSIS — Z8669 Personal history of other diseases of the nervous system and sense organs: Secondary | ICD-10-CM | POA: Diagnosis not present

## 2016-01-12 DIAGNOSIS — Z79899 Other long term (current) drug therapy: Secondary | ICD-10-CM | POA: Insufficient documentation

## 2016-01-12 DIAGNOSIS — Z7984 Long term (current) use of oral hypoglycemic drugs: Secondary | ICD-10-CM | POA: Diagnosis not present

## 2016-01-12 LAB — BASIC METABOLIC PANEL
Anion gap: 6 (ref 5–15)
BUN: 16 mg/dL (ref 6–20)
CALCIUM: 8.6 mg/dL — AB (ref 8.9–10.3)
CO2: 34 mmol/L — ABNORMAL HIGH (ref 22–32)
CREATININE: 0.81 mg/dL (ref 0.61–1.24)
Chloride: 101 mmol/L (ref 101–111)
GFR calc non Af Amer: >60 mL/min (ref >60)
Glucose, Bld: 135 mg/dL — ABNORMAL HIGH (ref 65–99)
Potassium: 3.4 mmol/L — ABNORMAL LOW (ref 3.5–5.1)
SODIUM: 141 mmol/L (ref 135–145)

## 2016-01-12 LAB — URINALYSIS COMPLETE WITH MICROSCOPIC (ARMC ONLY)
BACTERIA UA: NONE SEEN
Bilirubin Urine: NEGATIVE
GLUCOSE, UA: NEGATIVE mg/dL
Hgb urine dipstick: NEGATIVE
Leukocytes, UA: NEGATIVE
NITRITE: NEGATIVE
PROTEIN: 30 mg/dL — AB
pH: 5 (ref 5.0–8.0)

## 2016-01-12 LAB — CBC WITH DIFFERENTIAL/PLATELET
BASOS PCT: 0 %
Basophils Absolute: 0 10*3/uL (ref 0–0.1)
EOS ABS: 0.1 10*3/uL (ref 0–0.7)
Eosinophils Relative: 2 %
HCT: 30.3 % — ABNORMAL LOW (ref 40.0–52.0)
HEMOGLOBIN: 10 g/dL — AB (ref 13.0–18.0)
Lymphocytes Relative: 5 %
Lymphs Abs: 0.4 10*3/uL — ABNORMAL LOW (ref 1.0–3.6)
MCH: 26.9 pg (ref 26.0–34.0)
MCHC: 33.1 g/dL (ref 32.0–36.0)
MCV: 81.2 fL (ref 80.0–100.0)
MONOS PCT: 6 %
Monocytes Absolute: 0.5 10*3/uL (ref 0.2–1.0)
NEUTROS PCT: 87 %
Neutro Abs: 6.5 10*3/uL (ref 1.4–6.5)
Platelets: 186 10*3/uL (ref 150–440)
RBC: 3.73 MIL/uL — AB (ref 4.40–5.90)
RDW: 18.2 % — ABNORMAL HIGH (ref 11.5–14.5)
WBC: 7.5 10*3/uL (ref 3.8–10.6)

## 2016-01-12 LAB — PROTIME-INR
INR: 1.13
PROTHROMBIN TIME: 14.7 s (ref 11.4–15.0)

## 2016-01-12 LAB — TROPONIN I: Troponin I: 0.03 ng/mL (ref <0.03)

## 2016-01-12 MED ORDER — IPRATROPIUM-ALBUTEROL 0.5-2.5 (3) MG/3ML IN SOLN
3.0000 mL | Freq: Once | RESPIRATORY_TRACT | Status: AC
  Administered 2016-01-12: 3 mL via RESPIRATORY_TRACT
  Filled 2016-01-12: qty 3

## 2016-01-12 MED ORDER — SODIUM CHLORIDE 0.9 % IV BOLUS (SEPSIS)
500.0000 mL | Freq: Once | INTRAVENOUS | Status: AC
  Administered 2016-01-12: 500 mL via INTRAVENOUS

## 2016-01-12 MED ORDER — IOPAMIDOL (ISOVUE-370) INJECTION 76%
75.0000 mL | Freq: Once | INTRAVENOUS | Status: AC | PRN
  Administered 2016-01-12: 75 mL via INTRAVENOUS

## 2016-01-12 MED ORDER — SULFAMETHOXAZOLE-TRIMETHOPRIM 800-160 MG PO TABS: 1.0000 | ORAL_TABLET | Freq: Two times a day (BID) | ORAL | Status: DC

## 2016-01-12 NOTE — ED Notes (Signed)
Social work at bedside.  

## 2016-01-12 NOTE — ED Provider Notes (Addendum)
[facility] Emergency Department Provider Note  ____________________________________________  Time seen: 11:05 AM  I have reviewed the triage vital signs and the nursing notes.   HISTORY  Chief Complaint Syncope Level 5 caveat:  Portions of the history and physical were unable to be obtained due to the patient's poor historian   HPI John Mendoza is a 70 y.o. male with hypertension diabetes Parkinson seizure disorder DVT on Coumadin and recently discharged from the hospital this morning where he was treated for aspiration pneumonia and had a PEG tube placed.Upon returning home, he reports that he took his medications this morning, and then there was an apparent syncope episode that his wife reports where he became unresponsive for several minutes. The patient does not remember this. When EMS arrived, the patient was sitting upright, calm and comfortable awake alert and oriented.  Currently the patient reports that he is having some chest pain on the left side. Nonradiating. Not associated with any acute shortness of breath. No diaphoresis or vomiting. He denies any dizziness at present time. Pain is dull ache. Worsened with coughing. Currently mild in intensity, intermittent. Not exertional   Past Medical History  Diagnosis Date  . Barrett esophagus   . Hypertension   . Hyperlipidemia   . Multiple pulmonary nodules   . CHF (congestive heart failure) ([place])   . Lumbar spinal stenosis   . COPD (chronic obstructive pulmonary disease) ([place])   . CAD (coronary artery disease)     on 2l home oxygen  . On home oxygen therapy   . DVT (deep venous thrombosis) ([place]) 11/2013; 02/01/2015    on coumadin  . Parkinson's disease ([place])   . Recurrent aspiration pneumonia ([place])   . Sleep apnea   . Hypothyroidism   . Type II diabetes mellitus ([place])   . Iron deficiency anemia   . GERD (gastroesophageal reflux disease)   . Seizures ([place])   . DJD (degenerative joint disease)    . Arthritis   . Chronic lower back pain   . History of gout   . Depression   . Anxiety   . Basal cell carcinoma   . Throat cancer ([place])   . Heart attack ([place]) 03/20/1995  . 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 Hypothyroidism 01/04/2016  . Malnutrition of moderate degree 12/12/2015  . Aspiration pneumonia ([place]) 11/29/2015  . Dehydration 03/07/2015  . Orthostatic hypotension 03/06/2015  . Hypotension 03/06/2015  . Constipation 02/05/2015  . Dysphagia causing pulmonary aspiration with swallowing 02/03/2015  . Diskitis   . 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 Discitis of lumbar region 02/01/2015  . Epidural abscess 02/01/2015  . Warfarin anticoagulation 02/01/2015  . Parkinsons disease ([place]) 02/01/2015  . Vertebral osteomyelitis ([place]) 02/01/2015  . Dysphagia/chronic 02/01/2015  . Bacteremia due to group B Streptococcus 01/28/2015  . Acute on chronic respiratory failure with hypoxia ([place]) 01/26/2015  . Sepsis ([place]) 01/26/2015  . History of DVT (deep vein thrombosis) 02/20/2014  . 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 Multiple pulmonary nodules 01/15/2012  . Aspiration pneumonitis ([place]) 01/15/2012  . Hypertension 01/15/2012  . Barrett's esophagus 01/15/2012  . CAD (coronary artery disease) 01/15/2012  . COPD (chronic obstructive pulmonary disease) ([place]) 01/15/2012     Past Surgical History  Procedure Laterality Date  . Anterior cervical decomp/discectomy fusion  X 2  . Radical  neck dissection Right ~ 1998    "throat cancer"  . Total knee arthroplasty Right 2011  . Thyroidectomy  ~ 1996 X 2  . Cataract extraction w/ intraocular lens  implant, bilateral Bilateral   . Tonsillectomy    . Excisional hemorrhoidectomy    . Joint replacement    . Back surgery    . Posterior fusion cervical spine  X 1  . Coronary  angioplasty with stent placement    . Coronary artery bypass graft  1996    "CABG X3"  . Basal cell carcinoma excision    . Peg placement N/A 01/10/2016    Procedure: PERCUTANEOUS ENDOSCOPIC GASTROSTOMY (PEG) PLACEMENT;  Surgeon: Lucilla Lame, MD;  Location: [facility] ENDOSCOPY;  Service: Endoscopy;  Laterality: N/A;     Current Outpatient Rx  Name  Route  Sig  Dispense  Refill  . acetaminophen (TYLENOL) 500 MG tablet   Oral   Take 500 mg by mouth every 6 (six) hours as needed.         . ARIPiprazole (ABILIFY) 5 MG tablet   Oral   Take 5 mg by mouth 2 (two) times daily.         . Artificial Tear Ointment (DRY EYES OP)   Both Eyes   Place 1 drop into both eyes daily as needed (dry eyes).         Marland Kitchen aspirin EC 81 MG tablet   Oral   Take 81 mg by mouth at bedtime.          Marland Kitchen atorvastatin (LIPITOR) 40 MG tablet   Oral   Take 40 mg by mouth at bedtime.         . bisacodyl (DULCOLAX) 10 MG suppository   Rectal   Place 1 suppository (10 mg total) rectally daily as needed for moderate constipation.   12 suppository   0   . bisoprolol (ZEBETA) 10 MG tablet   Oral   Take 10 mg by mouth daily.         . carbidopa-levodopa (SINEMET IR) 25-250 MG tablet   Oral   Take 2.5 tablets by mouth 3 (three) times daily.          . cefUROXime (CEFTIN) 500 MG tablet   Oral   Take 1 tablet (500 mg total) by mouth 2 (two) times daily with a meal. X 7 more days   14 tablet   0   . clonazePAM (KLONOPIN) 1 MG tablet   Oral   Take 1 mg by mouth at bedtime.         . fentaNYL (DURAGESIC - DOSED MCG/HR) 50 MCG/HR   Transdermal   Place 1 patch (50 mcg total) onto the skin every 3 (three) days.   10 patch   0   . HYDROcodone-acetaminophen (NORCO/VICODIN) 5-325 MG tablet   Oral   Take 0.5 tablets by mouth every 8 (eight) hours as needed for moderate pain.          Marland Kitchen lamoTRIgine (LAMICTAL) 25 MG tablet   Oral   Take 25 mg by mouth 2 (two) times daily.         Marland Kitchen  levothyroxine (SYNTHROID, LEVOTHROID) 137 MCG tablet   Oral   Take 137 mcg by mouth daily.         Marland Kitchen losartan (COZAAR) 25 MG tablet   Oral   Take 25 mg by mouth at bedtime.         . metFORMIN (GLUCOPHAGE) 500 MG tablet  Oral   Take 500 mg by mouth 2 (two) times daily.         . nitroGLYCERIN (NITROSTAT) 0.4 MG SL tablet   Sublingual   Place 0.4 mg under the tongue every 5 (five) minutes x 3 doses as needed for chest pain.          Marland Kitchen omeprazole (PRILOSEC) 20 MG capsule   Oral   Take 20 mg by mouth 2 (two) times daily before a meal.          . PARoxetine (PAXIL) 20 MG tablet   Oral   Take 60 mg by mouth daily.          . tamsulosin (FLOMAX) 0.4 MG CAPS   Oral   Take 0.4 mg by mouth daily.          Marland Kitchen tiZANidine (ZANAFLEX) 4 MG tablet   Oral   Take 4 mg by mouth every 8 (eight) hours as needed for muscle spasms.         Marland Kitchen triamcinolone cream (KENALOG) 0.5 %   Topical   Apply 1 application topically 2 (two) times daily as needed (rash).          . warfarin (COUMADIN) 5 MG tablet   Oral   Take 1 tablet (5 mg total) by mouth daily.   30 tablet   11   . sulfamethoxazole-trimethoprim (BACTRIM DS) 800-160 MG tablet   Oral   Take 1 tablet by mouth 2 (two) times daily.   14 tablet   0      Allergies Other   Family History  Problem Relation Age of Onset  . Leukemia Paternal Grandmother   . Cancer Maternal Grandmother     stomach  . Cancer Paternal Aunt   . Other Father     bacterial endocarditis  . Rheum arthritis Father     Social History Social History  Substance Use Topics  . Smoking status: Former Smoker -- 2.00 packs/day for 35 years    Types: Cigarettes    Quit date: 03/20/1995  . Smokeless tobacco: Never Used  . Alcohol Use: Yes     Comment: 02/01/2015 "stopped drinking in ~ 1996; never had problem w/it"    Review of Systems  Constitutional:   No fever or chills.  ENT:   No sore throat. No rhinorrhea.Chronic  dysphagia Cardiovascular:   Positive as above chest pain. Respiratory:   Positive shortness of breath and nonproductive cough, subacute. Gastrointestinal:   Negative for abdominal pain, vomiting and diarrhea. PEG tube Genitourinary:   Negative for dysuria or difficulty urinating. Musculoskeletal:   Negative for focal pain or swelling Neurological:   Negative for headaches area reports acute on chronic peripheral neuropathy pain 10-point ROS otherwise negative.  ____________________________________________   PHYSICAL EXAM:  VITAL SIGNS: ED Triage Vitals  Enc Vitals Group     BP 01/12/16 1104 106/79 mmHg     Pulse Rate 01/12/16 1104 116     Resp 01/12/16 1104 23     Temp --      Temp src --      SpO2 01/12/16 1104 96 %     Weight 01/12/16 1104 162 lb (73.483 kg)     Height 01/12/16 1104 5\' 9"  (1.753 m)     Head Cir --      [facility] Flow --      Pain Score 01/12/16 1109 5     Pain Loc --      Pain Edu? --  Excl. in [place]? --     Vital signs reviewed, nursing assessments reviewed.   Constitutional:   Alert and oriented. Well appearing and in no distress. Eyes:   No scleral icterus. No conjunctival pallor. PERRL. EOMI.  No nystagmus. ENT   Head:   Normocephalic and atraumatic.   Nose:   No congestion/rhinnorhea. No septal hematoma   Mouth/Throat:   Dry mucous membranes, no pharyngeal erythema. No peritonsillar mass.    Neck:   No stridor. No SubQ emphysema. No meningismus. Hematological/Lymphatic/Immunilogical:   No cervical lymphadenopathy. Cardiovascular:   Irregularly irregular rhythm heart rate 100-110, on monitor appears to be atrial flutter with variable conduction. Symmetric bilateral radial and DP pulses.  No murmurs.  Respiratory:   Normal respiratory effort without tachypnea nor retractions. Slight expiratory wheezing diffusely. In the mid and lower lung fields of the left lung there are crackles diffusely on inspiration. There are bronchial breath sounds  suggestive of consolidation at the left base. The right base is crackly as well. Gastrointestinal:   Soft and nontender. Non distended. PEG tube in place and secure. There is no CVA tenderness.  No rebound, rigidity, or guarding. Genitourinary:   deferred Musculoskeletal:   Nontender with normal range of motion in all extremities. No joint effusions.  No lower extremity tenderness.  No edema. Neurologic:   Baseline speech normal language.  CN 2-10 normal. Motor grossly intact. No gross focal neurologic deficits are appreciated.  Skin:    Skin is warm, dry . No petechia purpura or bullae. There is a rash with irregular margins and pustular eruptions measuring approximately 10 cm in diameter on the left lateral neck just superior to the clavicle.  There is also a circular erythematous raised lesion on the left upper abdomen consistent with contact dermatitis to a circular electrode sticker. Poor skin turgor  ____________________________________________    LABS (pertinent positives/negatives) (all labs ordered are listed, but only abnormal results are displayed) Labs Reviewed  BASIC METABOLIC PANEL - Abnormal; Notable for the following:    Potassium 3.4 (*)    CO2 34 (*)    Glucose, Bld 135 (*)    Calcium 8.6 (*)    All other components within normal limits  CBC WITH DIFFERENTIAL/PLATELET - Abnormal; Notable for the following:    RBC 3.73 (*)    Hemoglobin 10.0 (*)    HCT 30.3 (*)    RDW 18.2 (*)    Lymphs Abs 0.4 (*)    All other components within normal limits  TROPONIN I - Abnormal; Notable for the following:    Troponin I 0.03 (*)    All other components within normal limits  PROTIME-INR  URINALYSIS COMPLETEWITH MICROSCOPIC ([facility] ONLY)   ____________________________________________   EKG  Interpreted by me Atrial flutter rate 106, variable conduction, normal axis and intervals. Normal QRS and ST segments. T wave inversions in lead 3. There is a S1Q3T3 pattern which appears  to be unchanged from previous. Grossly the EKG is unchanged from 12/11/2015.   ____________________________________________    RADIOLOGY  CT head unremarkable Chest x-ray shows possible left lower lobe pneumonia CT angiogram chest shows bilateral lower lobe pneumonia consistent with pneumonia, no evidence of pulmonary embolism. There is a question of soft tissue air in the anterior neck. No evidence of abscess or cellulitis or mediastinitis. Images reviewed by me, this is of uncertain significance. Not correlated with any clinical symptoms or exam findings  ____________________________________________   PROCEDURES   ____________________________________________   INITIAL IMPRESSION / ASSESSMENT  AND PLAN / ED COURSE  Pertinent labs & imaging results that were available during my care of the patient were reviewed by me and considered in my medical decision making (see chart for details).  Patient with severe baseline comorbidities and poor physical health presents to the emergency department same day of hospital discharge orders treated for aspiration pneumonia. An apparent syncope episode at home. I think the chest pain years be unchanged and chronic from the pleurisy he was having when he was admitted with bilateral pneumonia just a few days ago. Currently he looks like he is at his baseline although probably actually a little bit dehydrated despite his chronic systolic heart failure.  We'll recheck labs urinalysis, given a 500 mL saline bolus. Repeat the chest x-ray, get a CT head given his anticoagulant use, consider CT angiogram of the chest.   ----------------------------------------- 3:30 PM on 01/12/2016 -----------------------------------------  Initial workup unremarkable. INR was essentially normal so he proceeded with CT enter gram of the chest. This was negative except for re-demonstrating the ER any known pneumonia for which she is being treated. Cellulitis of left neck  is suspicious for MRSA infections also him on Bactrim as well. Follow-up with primary care. He is tolerating oral intake and otherwise medically stable and in baseline state of health. A flutter is rate controlled. ____________________________________________   FINAL CLINICAL IMPRESSION(S) / ED DIAGNOSES  Final diagnoses:  Cellulitis of neck  Pleurisy       Portions of this note were generated with dragon dictation software. Dictation errors may occur despite best attempts at proofreading.   Carrie Mew, MD 01/12/16 1531   I had a long discussion with the patient and his family regarding discharge home. They report that he has been in poor health over the past year due to his chronic issues with swallowing. Even though he now has a PEG tube and nutrition orders, they're dissatisfied and think that he will not get better. The family is very resistant to discharge home and has changed her mind. Yesterday when they were leaving the hospital they wanted home health, now they want him to be in a rehabilitation. Patient wants to go home, but with insistence from family he is willing to be assigned to a rehabilitation stay for a short period of time. I discussed with the social worker who is facilitating placement at [facility].  [facility] will [facility] able to [facility] the patient tomorrow morning. Other than Bactrim for a mild cellulitis, he has no acute medical needs other than the plan in place at discharge from the hospital yesterday. Patient may be discharged home with plan to move to [facility] tomorrow morning.  Carrie Mew, MD 01/12/16 2030

## 2016-01-12 NOTE — Clinical Social Work Note (Signed)
Clinical Social Work Assessment  Patient Details  Name: John Mendoza MRN: [id number] Date of Birth: 06-Apr-1946  Date of referral:  01/12/16               Reason for consult:  Facility Placement                Permission sought to share information with:  Family Supports, Customer service manager Permission granted to share information::  Yes, Verbal Permission Granted  Name::     Akil Hoos wife  Agency::  yes  Relationship::  yes  Contact Information:  Tyde Lamison  [phone]  Housing/Transportation Living arrangements for the past 2 months:  [place] of Information:  Patient, Spouse Patient Interpreter Needed:  None Criminal Activity/Legal Involvement Pertinent to Current Situation/Hospitalization:  No - Comment as needed Significant Relationships:  Adult Children, Church, [employer], Spouse Lives with:  Spouse Do you feel safe going back to the place where you live?  Yes Need for family participation in patient care:  Yes (Comment)  Care giving concerns: Family is unable to manage patients health care needs at home at this time and want him placed at [facility]   Social Worker assessment / plan:Patient met with LCSW and spoke to wife Verbal consent given. Patient has Medicare and met the qualifying inpatient stay. Patient has Parkinsons, feeding tube,3 litres 02. Patient is currently married and between them they have 3 grown adult children. He reports he needs to get stronger and would benefit from a SNF placement. LCSW called Joe from [facility] direct and he might be able to take patient this evening.Patient needs full assistance with all ADL's but remains continent and needs help to chair to bathroom. Patient will require Pt and OT 5x day.   Employment status:  Disabled (Comment on whether or not currently receiving Disability), Retired Forensic scientist:    PT Recommendations:  [place] / Referral to community  resources:  [place]  Patient/Family's Response to care: He needs SNF to get stronger  Patient/Family's Understanding of and Emotional Response to Diagnosis, Current Treatment, and Prognosis: He required some assistance to help him regain his strength.  Emotional Assessment Appearance:  Appears stated age Attitude/Demeanor/Rapport:   (Polite, calm) Affect (typically observed):  Accepting, Adaptable, Anxious Orientation:  Oriented to Self, Oriented to Place, Oriented to  Time, Oriented to Situation Alcohol / Substance use:  Not Applicable Psych involvement (Current and /or in the community):  No (Comment)  Discharge Needs  Concerns to be addressed:  No discharge needs identified Readmission within the last 30 days:  No Current discharge risk:  None Barriers to Discharge:  No Barriers Identified   Joana Reamer, LCSW 01/12/2016, 4:48 PM

## 2016-01-12 NOTE — ED Notes (Addendum)
BIB EMS from wife reports pt was unresponsive and not answering questions. Upon EMS arrival pt is sitting in chair and responsive. Pt alert pt self and place. Says he does not remember incident  Pt discharged today for Pneumonia. Pt c/o left side chest pain.

## 2016-01-12 NOTE — Progress Notes (Signed)
LCSW called Broadus John at [facility] and he is reviewing the patient information and reports he might not be able to accept bed offer until tommoroe. Assessment and Fl2 and passr number completed and sent via the hub.

## 2016-01-12 NOTE — Discharge Instructions (Signed)
Cellulitis Cellulitis is an infection of the skin and the tissue beneath it. The infected area is usually red and tender. Cellulitis occurs most often in the arms and lower legs.  CAUSES  Cellulitis is caused by bacteria that enter the skin through cracks or cuts in the skin. The most common types of bacteria that cause cellulitis are staphylococci and streptococci. SIGNS AND SYMPTOMS   Redness and warmth.  Swelling.  Tenderness or pain.  Fever. DIAGNOSIS  Your health care provider can usually determine what is wrong based on a physical exam. Blood tests may also be done. TREATMENT  Treatment usually involves taking an antibiotic medicine. HOME CARE INSTRUCTIONS   Take your antibiotic medicine as directed by your health care provider. Finish the antibiotic even if you start to feel better.  Keep the infected arm or leg elevated to reduce swelling.  Apply a warm cloth to the affected area up to 4 times per day to relieve pain.  Take medicines only as directed by your health care provider.  Keep all follow-up visits as directed by your health care provider. SEEK MEDICAL CARE IF:   You notice red streaks coming from the infected area.  Your red area gets larger or turns dark in color.  Your bone or joint underneath the infected area becomes painful after the skin has healed.  Your infection returns in the same area or another area.  You notice a swollen bump in the infected area.  You develop new symptoms.  You have a fever. SEEK IMMEDIATE MEDICAL CARE IF:   You feel very sleepy.  You develop vomiting or diarrhea.  You have a general ill feeling (malaise) with muscle aches and pains.   This information is not intended to replace advice given to you by your health care provider. Make sure you discuss any questions you have with your health care provider.   Document Released: 04/02/2005 Document Revised: 03/14/2015 Document Reviewed: 09/08/2011 Elsevier Interactive  Patient Education 2016 [place] is an inflammation and swelling of the lining of the lungs (pleura). Because of this inflammation, it hurts to breathe. It can be aggravated by coughing, laughing, or deep breathing. Pleurisy is often caused by an underlying infection or disease.  HOME CARE INSTRUCTIONS  Monitor your pleurisy for any changes. The following actions may help to alleviate any discomfort you are experiencing:  Medicine may help with pain. Only take over-the-counter or prescription medicines for pain, discomfort, or fever as directed by your health care provider.  Only take antibiotic medicine as directed. Make sure to finish it even if you start to feel better. SEEK MEDICAL CARE IF:   Your pain is not controlled with medicine or is increasing.  You have an increase in pus-like (purulent) secretions brought up with coughing. SEEK IMMEDIATE MEDICAL CARE IF:   You have blue or dark lips, fingernails, or toenails.  You are coughing up blood.  You have increased difficulty breathing.  You have continuing pain unrelieved by medicine or pain lasting more than 1 week.  You have pain that radiates into your neck, arms, or jaw.  You develop increased shortness of breath or wheezing.  You develop a fever, rash, vomiting, fainting, or other serious symptoms. MAKE SURE YOU:  Understand these instructions.   Will watch your condition.   Will get help right away if you are not doing well or get worse.    This information is not intended to replace advice given to you by  your health care provider. Make sure you discuss any questions you have with your health care provider.   Document Released: 06/23/2005 Document Revised: 02/23/2013 Document Reviewed: 12/05/2012 Elsevier Interactive Patient Education [employer].

## 2016-01-12 NOTE — ED Notes (Signed)
Social worker and wife at bedside

## 2016-01-12 NOTE — NC FL2 (Signed)
[place] LEVEL OF CARE SCREENING TOOL     IDENTIFICATION  Patient Name: John Mendoza Birthdate: [birth year] Sex: male Admission Date (Current Location): 01/12/2016  [place] and [place] Number:  Engineering geologist and Address:  [facility], [address], [place], [place] [address]      Provider Number: [id number]  Attending Physician Name and Address:  Carrie Mew, MD  Relative Name and Phone Number:       Current Level of Care: Hospital Recommended Level of Care: [place] Prior Approval Number:    Date Approved/Denied:   PASRR Number:   [id number]   Discharge Plan: SNF    Current Diagnoses: Patient Active Problem List   Diagnosis Date Noted  . Hypothyroidism 01/04/2016  . Malnutrition of moderate degree 12/12/2015  . Aspiration pneumonia ([place]) 11/29/2015  . Dehydration 03/07/2015  . Orthostatic hypotension 03/06/2015  . Hypotension 03/06/2015  . Constipation 02/05/2015  . Dysphagia causing pulmonary aspiration with swallowing 02/03/2015  . Diskitis   . 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 Discitis of lumbar region 02/01/2015  . Epidural abscess 02/01/2015  . Warfarin anticoagulation 02/01/2015  . Parkinsons disease ([place]) 02/01/2015  . Vertebral osteomyelitis ([place]) 02/01/2015  . Dysphagia/chronic 02/01/2015  . Bacteremia due to group B Streptococcus 01/28/2015  . Acute on chronic respiratory failure with hypoxia ([place]) 01/26/2015  . Sepsis ([place]) 01/26/2015  . History of DVT (deep vein thrombosis) 02/20/2014  . 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 Multiple pulmonary nodules 01/15/2012  . Aspiration pneumonitis ([place]) 01/15/2012  . Hypertension 01/15/2012  . Barrett's esophagus 01/15/2012  . CAD (coronary artery disease) 01/15/2012  . COPD (chronic obstructive pulmonary disease) ([place]) 01/15/2012    Orientation RESPIRATION BLADDER Height &  Weight     Self, Time, Situation, Place  O2 (3 litres) Continent Weight: 162 lb (73.483 kg) Height:  5\' 9"  (175.3 cm)  BEHAVIORAL SYMPTOMS/MOOD NEUROLOGICAL BOWEL NUTRITION STATUS      Continent Diet (Feeder tube-)  AMBULATORY STATUS COMMUNICATION OF NEEDS Skin   Supervision Verbally Normal                       Personal Care Assistance Level of Assistance  Bathing, Feeding, Dressing, Total care Bathing Assistance: Maximum assistance Feeding assistance: Maximum assistance (feeding tube) Dressing Assistance: Maximum assistance Total Care Assistance: Limited assistance   Functional Limitations Info  Sight, Hearing, Speech Sight Info: Adequate Hearing Info: Adequate Speech Info: Adequate    SPECIAL CARE FACTORS FREQUENCY  PT (By licensed PT), OT (By licensed OT)     PT Frequency: 5x OT Frequency: 5x            Contractures      Additional Factors Info  Code Status, Suctioning Needs (patient suctions his throat himself and has his own machine) Code Status Info: full code             Current Medications (01/12/2016):  This is the current hospital active medication list No current facility-administered medications for this encounter.   Current Outpatient Prescriptions  Medication Sig Dispense Refill  . acetaminophen (TYLENOL) 500 MG tablet Take 500 mg by mouth every 6 (six) hours as needed.    . ARIPiprazole (ABILIFY) 5 MG tablet Take 5 mg by mouth 2 (two) times daily.    . Artificial Tear Ointment (DRY EYES OP) Place 1 drop into both eyes daily as needed (dry eyes).    Marland Kitchen aspirin EC 81 MG  tablet Take 81 mg by mouth at bedtime.     Marland Kitchen atorvastatin (LIPITOR) 40 MG tablet Take 40 mg by mouth at bedtime.    . bisacodyl (DULCOLAX) 10 MG suppository Place 1 suppository (10 mg total) rectally daily as needed for moderate constipation. 12 suppository 0  . bisoprolol (ZEBETA) 10 MG tablet Take 10 mg by mouth daily.    . carbidopa-levodopa (SINEMET IR) 25-250 MG tablet  Take 2.5 tablets by mouth 3 (three) times daily.     . cefUROXime (CEFTIN) 500 MG tablet Take 1 tablet (500 mg total) by mouth 2 (two) times daily with a meal. X 7 more days 14 tablet 0  . clonazePAM (KLONOPIN) 1 MG tablet Take 1 mg by mouth at bedtime.    . fentaNYL (DURAGESIC - DOSED MCG/HR) 50 MCG/HR Place 1 patch (50 mcg total) onto the skin every 3 (three) days. 10 patch 0  . HYDROcodone-acetaminophen (NORCO/VICODIN) 5-325 MG tablet Take 0.5 tablets by mouth every 8 (eight) hours as needed for moderate pain.     Marland Kitchen lamoTRIgine (LAMICTAL) 25 MG tablet Take 25 mg by mouth 2 (two) times daily.    Marland Kitchen levothyroxine (SYNTHROID, LEVOTHROID) 137 MCG tablet Take 137 mcg by mouth daily.    Marland Kitchen losartan (COZAAR) 25 MG tablet Take 25 mg by mouth at bedtime.    . metFORMIN (GLUCOPHAGE) 500 MG tablet Take 500 mg by mouth 2 (two) times daily.    . nitroGLYCERIN (NITROSTAT) 0.4 MG SL tablet Place 0.4 mg under the tongue every 5 (five) minutes x 3 doses as needed for chest pain.     Marland Kitchen omeprazole (PRILOSEC) 20 MG capsule Take 20 mg by mouth 2 (two) times daily before a meal.     . PARoxetine (PAXIL) 20 MG tablet Take 60 mg by mouth daily.     . tamsulosin (FLOMAX) 0.4 MG CAPS Take 0.4 mg by mouth daily.     Marland Kitchen tiZANidine (ZANAFLEX) 4 MG tablet Take 4 mg by mouth every 8 (eight) hours as needed for muscle spasms.    Marland Kitchen triamcinolone cream (KENALOG) 0.5 % Apply 1 application topically 2 (two) times daily as needed (rash).     . warfarin (COUMADIN) 5 MG tablet Take 1 tablet (5 mg total) by mouth daily. 30 tablet 11  . sulfamethoxazole-trimethoprim (BACTRIM DS) 800-160 MG tablet Take 1 tablet by mouth 2 (two) times daily. 14 tablet 0     Discharge Medications: Please see discharge summary for a list of discharge medications.  Relevant Imaging Results:  Relevant Lab Results:   Additional Information  SSN [id number]  Joana Reamer, [place]

## 2016-02-18 NOTE — Progress Notes (Signed)
[name]      Assessment and Plan:  Hemoptysis.  --This is likely due to chronic bronchitis and airway drying from oxygen combined with coumadin. He tells me that his most recent INR was therapeutic.  --He does not have symptoms of acute bronchitis, and feels that his breathing is otherwise at baseline, he denies fevers. We will therefore monitor this, and check a sputum culture.  --He has a history of goodpasture's as well will monitor to see if this is contributory.  --If he develops hemoptysis recurrently will need to consider stopping coumadin.    Aspiration into respiratory tract --Currently NPO and fed via feeding tube.   COPD (chronic obstructive pulmonary disease) This has been a stable interval  Continue currently inhalers.   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  However, considering his recent diagnosis of parkinsonism we will need to keep a close eye on his balance and carefully evaluate his fall risk. Today he appears to be quite steady on his feet but we should keep this in mind as he ages.  MAI (mycobacterium avium-intracellulare) He grew out MAI from 1 out of 3 cultures in 2013.  We will continue to observe and watch for evidence of fevers, chills, weight loss, or purulent sputum production.  Multiple pulmonary nodules The patient has had numerous CT scans which have showed no change in the nodules. He has multiple reasons for which he may have lung nodules including previous MAC, recurrent aspiration, and goodpasture's. This combined with his possibly limited life span, I do not think he would be a good candidate for continued screening.   Date: 02/18/2016  MRN# [id number] John Mendoza May 02, 1946  Referring Physician:   ROBERTA Mendoza is a 70 y.o. old male seen in consultation for chief complaint of:    Chief Complaint  Patient presents with  . pulmonary consult    former BQ pt  last seen in 2016. pt had CT angio/ED visit on 01/12/16 for pleurisy and cellulitis of neck. recently admitted 01/04/16 for double PNA. pt c/o coughing up blood X1d prior to this morning prod cough w/yellow-greenish mucus, occ chest tightness & sore throat. on 2L with exertion & qhs    HPI:   He was seen by Dr. [place] Mendoza in January 2016 with recurrent cough as well as multiple bilateral pulmonary nodules, thought to be due to dysphagia and aspiration from recently diagnosed Parkinson's disease  He seen today as a hospital follow-up from approximately 1 month ago for aspiration pneumonia. He underwent a swallowing evaluation at that time was found to have severe oral pharyngeal dysphagia, and underwent PEG tube placement. Since getting out of the hospital, he feels that he is feeling better overall, his oxygen is above 95% at home without supplemental oxygen. He continues to use oxygen to be on 2L oxygen at night, he has portable oxygen but does not use it. He is going to start using an exercise bicycle.   This morning he has noted that he is coughing up blood, he has not had any nose bleeds.  He is not using any inhalers currently.  He has a diagnosis of Parkinsons disease, which he has been told that is advanced. He can not swallow and is currently on tube feedings.  Previous testing:  August 2012 CT chest [facility]: (My read) Left basilar groundglass opacity most consistent with an infectious process scattered nodules all less than 1 cm throughout both lobes. Emphysema noted some  tree in bed opacities noted.  June 2013 CT chest [facility]: There is emphysema noted throughout and the the quality of the study on my computer is limited; I appreciate tree in bud opacities in the bases bilaterally. There are also multiple scattered nodules in both lungs. There is one pleural-based nodule in the left lung which unfortunately I am unable to see completely which I measure at 1.6 cm.  01/2012 sputum AFB  positive for MAI in 1 of 3 cultures  04/15/2012 CT chest [facility]>> no lymphadenopathy, no change in pulmonary nodules; LLL bronchiectasis, likely aspiration  11/15/2012 CT chest [facility]> multiple new subcentimeter pulm nodules in the LLL, indeterminate but likely infectious vs aspiration; slighly increased LLL consodlidation likely due to chronic aspiration; diverticulosis but no diverticulitis; 2.4 cm lesion left lower lobe  02/2013 PET CT > no PET positive lesions identified; some consolidation noted in left lung as prior studies  06/2013 CT Chest > chronic scarring lower lobes, likely aspiration; nodules are unchanged  01/2014 CT chest > 53mm nodule RLL pleural based, scattered scarring and aspiration changes in bases unchanged  07/2014 CT chest > NO change in basilar atelectasis and nodules, images reviewed by me   PMHX:   Past Medical History:  Diagnosis Date  . Anxiety   . Arthritis   . Barrett esophagus   . Basal cell carcinoma   . CAD (coronary artery disease)    on 2l home oxygen  . CHF (congestive heart failure) ([place])   . Chronic lower back pain   . COPD (chronic obstructive pulmonary disease) ([place])   . Depression   . DJD (degenerative joint disease)   . DVT (deep venous thrombosis) ([place]) 11/2013; 02/01/2015   on coumadin  . GERD (gastroesophageal reflux disease)   . 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 Heart attack ([place]) 03/20/1995  . History of gout   . Hyperlipidemia   . Hypertension   . Hypothyroidism   . Iron deficiency anemia   . Lumbar spinal stenosis   . Multiple pulmonary nodules   . On home oxygen therapy   . Parkinson's disease ([place])   . Recurrent aspiration pneumonia ([place])   . Seizures ([place])   . Sleep apnea   . Throat cancer ([place])   . Type II diabetes mellitus ([place])    Surgical Hx:  Past Surgical History:  Procedure Laterality Date  . ANTERIOR CERVICAL DECOMP/DISCECTOMY FUSION  X 2    . BACK SURGERY    . BASAL CELL CARCINOMA EXCISION    . CATARACT EXTRACTION W/ INTRAOCULAR LENS  IMPLANT, BILATERAL Bilateral   . CORONARY ANGIOPLASTY WITH STENT PLACEMENT    . CORONARY ARTERY BYPASS GRAFT  1996   "CABG X3"  . EXCISIONAL HEMORRHOIDECTOMY    . JOINT REPLACEMENT    . PEG PLACEMENT N/A 01/10/2016   Procedure: PERCUTANEOUS ENDOSCOPIC GASTROSTOMY (PEG) PLACEMENT;  Surgeon: Lucilla Lame, MD;  Location: [facility] ENDOSCOPY;  Service: Endoscopy;  Laterality: N/A;  . POSTERIOR FUSION CERVICAL SPINE  X 1  . RADICAL NECK DISSECTION Right ~ 1998   "throat cancer"  . THYROIDECTOMY  ~ 1996 X 2  . TONSILLECTOMY    . TOTAL KNEE ARTHROPLASTY Right 2011  Medication:   Reviewed.     Allergies:  Other  Review of Systems: Gen:  Denies  fever, sweats, chills HEENT: Denies blurred vision, double vision Cvc:  No dizziness, chest pain. Resp:   Denies cough or  sputum production, shortness of breath Gi: Denies  stomach pain. Gu:  Denies bladder incontinence, burning urine Ext:   No Joint pain, stiffness. Skin: No skin rash,  hives  Endoc:  No polyuria, polydipsia. Psych: No depression, insomnia. Other:  All other systems were reviewed with the patient and were negative other that what is mentioned in the HPI.   Physical Examination:   VS: BP 120/80 (BP Location: Left Arm, Cuff Size: Normal)   Pulse (!) 115   Ht 5\' 9"  (1.753 m)   Wt 160 lb (72.6 kg)   SpO2 95%   BMI 23.63 kg/m   General Appearance: No distress  Neuro:without focal findings,  speech normal,  HEENT: PERRLA, EOM intact.   Pulmonary: normal breath sounds, No wheezing.  CardiovascularNormal S1,S2.  No m/r/g.   Abdomen: Benign, Soft, non-tender. Renal:  No costovertebral tenderness  GU:  No performed at this time. Endoc: No evident thyromegaly, no signs of acromegaly. Skin:   warm, no rashes, no ecchymosis  Extremities: normal, no cyanosis, clubbing.  Other findings:    LABORATORY PANEL:   CBC No results for  input(s): WBC, HGB, HCT, PLT in the last 168 hours. ------------------------------------------------------------------------------------------------------------------  Chemistries  No results for input(s): NA, K, CL, CO2, GLUCOSE, BUN, CREATININE, CALCIUM, MG, AST, ALT, ALKPHOS, BILITOT in the last 168 hours.  Invalid input(s): GFRCGP ------------------------------------------------------------------------------------------------------------------  Cardiac Enzymes No results for input(s): TROPONINI in the last 168 hours. ------------------------------------------------------------  RADIOLOGY:  No results found.     Thank  you for the consultation and for allowing [facility] to assist in the care of your patient. Our recommendations are noted above.  Please contact us if we can be of further service.   Marda Stalker, MD.  Board Certified in Internal Medicine, Pulmonary Medicine, [place], and Sleep Medicine.  [facility] Office Number: [phone]  Patricia Pesa, M.D.  Vilinda Boehringer, M.D.  Merton Border, M.D  02/18/2016

## 2016-02-21 ENCOUNTER — Other Ambulatory Visit
Admission: RE | Admit: 2016-02-21 | Discharge: 2016-02-21 | Disposition: A | Discharge status: Home / Self Care | Payer: Medicare Other | Source: Ambulatory Visit | Attending: Internal Medicine | Admitting: Internal Medicine

## 2016-02-21 ENCOUNTER — Ambulatory Visit (INDEPENDENT_AMBULATORY_CARE_PROVIDER_SITE_OTHER): Payer: Medicare Other | Admitting: Internal Medicine

## 2016-02-21 ENCOUNTER — Encounter: Payer: Self-pay | Admitting: Internal Medicine

## 2016-02-21 VITALS — BP 120/80 | HR 115 | Ht 69.0 in | Wt 160.0 lb

## 2016-02-21 DIAGNOSIS — J690 Pneumonitis due to inhalation of food and vomit: Secondary | ICD-10-CM | POA: Diagnosis not present

## 2016-02-21 DIAGNOSIS — J449 Chronic obstructive pulmonary disease, unspecified: Secondary | ICD-10-CM

## 2016-02-21 DIAGNOSIS — R042 Hemoptysis: Secondary | ICD-10-CM | POA: Diagnosis not present

## 2016-02-21 LAB — EXPECTORATED SPUTUM ASSESSMENT W REFEX TO RESP CULTURE

## 2016-02-21 LAB — EXPECTORATED SPUTUM ASSESSMENT W GRAM STAIN, RFLX TO RESP C

## 2016-02-21 MED ORDER — FLUTTER DEVI: 0 refills | Status: AC

## 2016-02-21 NOTE — Patient Instructions (Addendum)
--  Chest x ray in 3 months. If your breathing worsens, call for an earlier chest xray.   --Sputum culture.   --Flutter valve to be used at least 3 times per day.

## 2016-02-24 LAB — CULTURE, RESPIRATORY W GRAM STAIN

## 2016-02-24 LAB — CULTURE, RESPIRATORY

## 2016-02-27 ENCOUNTER — Telehealth: Payer: Self-pay | Admitting: Internal Medicine

## 2016-02-27 ENCOUNTER — Other Ambulatory Visit: Payer: Self-pay | Admitting: *Deleted

## 2016-02-27 MED ORDER — SULFAMETHOXAZOLE-TRIMETHOPRIM 800-160 MG PO TABS: 1.0000 | ORAL_TABLET | Freq: Two times a day (BID) | ORAL | 0 refills | Status: DC

## 2016-02-27 NOTE — Telephone Encounter (Signed)
Routed to pulmonary 

## 2016-02-27 NOTE — Telephone Encounter (Signed)
Patient returning call from misty re sputum cx

## 2016-02-27 NOTE — Telephone Encounter (Signed)
err

## 2016-02-28 NOTE — Telephone Encounter (Signed)
Spoke with pt in regards to cx yesterday afternoon. Nothing further needed.

## 2016-03-03 ENCOUNTER — Telehealth: Payer: Self-pay | Admitting: Internal Medicine

## 2016-03-03 NOTE — Telephone Encounter (Signed)
Spoke with pt who is concerned about his sputum culture results, he would like to know if the bacteria that is growing is caused by PNA or good pasture which pt had hx of?  DK please advise. Thanks.   Culture, respiratory (NON-Expectorated)  Order: [id number]  Status:  Final result Visible to patient:  No (Not Released) Next appt:  None  Notes Recorded by Renelda Mom, LPN on 624THL at D34-534 PM EDT Pt informed. RX sent to pharmacy. ------  Notes Recorded by Renelda Mom, LPN on 624THL at [id number] PM EDT LMOVM for pt to return call. ------  Notes Recorded by Laverle Hobby, MD on 02/27/2016 at 1:31 PM EDT Patients sputum did grow some bacteria. Please inform pt. Will start bactrim DS bid for 14 days.

## 2016-03-03 NOTE — Telephone Encounter (Signed)
Pt aware of below message and voiced understanding. Nothing further needed.

## 2016-03-03 NOTE — Telephone Encounter (Signed)
Just causes pneumonia

## 2016-03-03 NOTE — Telephone Encounter (Signed)
Pt would like sputum culture results. Please call. Ok to leave detailed msg if he is not available.

## 2016-03-31 ENCOUNTER — Other Ambulatory Visit: Payer: Self-pay | Admitting: Internal Medicine

## 2016-03-31 DIAGNOSIS — R1310 Dysphagia, unspecified: Secondary | ICD-10-CM

## 2016-04-17 ENCOUNTER — Ambulatory Visit
Admission: RE | Admit: 2016-04-17 | Discharge: 2016-04-17 | Disposition: A | Discharge status: Home / Self Care | Payer: Medicare Other | Source: Ambulatory Visit | Attending: Internal Medicine | Admitting: Internal Medicine

## 2016-04-17 DIAGNOSIS — R1310 Dysphagia, unspecified: Secondary | ICD-10-CM | POA: Insufficient documentation

## 2016-04-17 DIAGNOSIS — R1312 Dysphagia, oropharyngeal phase: Secondary | ICD-10-CM

## 2016-04-17 NOTE — Therapy (Addendum)
John Mendoza, [place], [address] Phone: [phone]   Fax:     Modified Barium Swallow  Patient Details  Name: John Mendoza MRN: [id number] Date of Birth: [birth year] Referring Provider: Dr. Ramonita Lab  Encounter Date: 04/17/2016   Subjective: Patient behavior: (alertness, ability to follow instructions, etc.): pt conversive and pleasant this session. Pt indicated he was eager to learn if he could "eat and drink again" and that living by PEG TFs alone "was no way to live". He has been followed by [facility] w/ oropharyngeal swallowing exercises. Pt has been seen by [facility] services in the past for Dysphagia w/ the last MBSS in 05/2015. He has had an admission for pneumonia per chart notes since that time. Noted ongoing oral tremors secondary to the Parkinsons's Disease. Native dentition.  Chief complaint: Dysphagia   Objective:  Radiological Procedure: A videoflouroscopic evaluation of oral-preparatory, reflex initiation, and pharyngeal phases of the swallow was performed; as well as a screening of the upper esophageal phase.  I. POSTURE: upright II. VIEW: lateral III. COMPENSATORY STRATEGIES: strong throat clear and cough w/ reaswallow and/or expectoration of any residue brought up IV. BOLUSES ADMINISTERED:  Thin Liquid: 4 (small) single sips via cup  Nectar-thick Liquid: 3 (small) single sips via cup  Honey-thick Liquid: NT  Puree: 2 (1/2 size) tsp trials  Mechanical Soft: NT V. RESULTS OF EVALUATION: A. ORAL PREPARATORY PHASE: (The lips, tongue, and velum are observed for strength and coordination)       **Overall Severity Rating: MILD-MODERATE. Pt exhibited lingual/oral tremors which decreased bolus cohesion; premature spillage w/ liquids noted; min oral/BOT tongue residue remained.   B. SWALLOW INITIATION/REFLEX: (The reflex is normal if "triggered" by the time the bolus reached the base of the  tongue)  **Overall Severity Rating: MODERATE-SEVERE. Pt exhibited delayed pharyngeal swallow initiation w/ all trials for his age; all liquids triggered a pharyngeal swallow initiation moreso at the level of the pyriform sinuses.   C. PHARYNGEAL PHASE: (Pharyngeal function is normal if the bolus shows rapid, smooth, and continuous transit through the pharynx and there is no pharyngeal residue after the swallow)  **Overall Severity Rating: SEVERE. Pt exhibited reduced laryngeal excursion and decreased anterior movement during the swallow; reduced BOT contact and pressure. Noted little to no epiglottic movement or inversion during the swallow.  D. LARYNGEAL PENETRATION: (Material entering into the laryngeal inlet/vestibule but not aspirated): occurred w/ all consistencies - primarily d/t the residue remaining post swallow w/ each consistency moving into the laryngeal vestibule. SILENT. E. ASPIRATION: occurred w/ w/ all consistencies d/t the residue remaining post swallow w/ each consistency and from the laryngeal Penetration having occurred. SILENT - much delayed cough response intermittently. F. ESOPHAGEAL PHASE: (Screening of the upper esophagus): no gross deficits noted in the cervical esophagus  ASSESSMENT:  Pt presented w/ severe pharyngeal phase dysphagia w/ noted laryngeal penetration and aspiration of bolus consistencies; mild-moderate oral phase dysphagia impacted by decreased bolus control/cohesion secondary to the Parkinson's Disease. During the pharyngeal swallow, pt exhibited reduced laryngeal excursion w/ little to no epiglottic movement/inversion and much reduced anterior movement of the arytenoids resulting in reduced (timing of) UES opening. This, along w/ the reduced tongue base pressure, resulted in reduced amount of bolus material clearing the pharynx into the Esophagus and moderate-severe amounts of bolus residue remaining in the pharynx: valleculae, pyriform sinuses, eriepiglottic  folds. This increased, diffuse amount of pharyngeal residue (in the area adjacent to the  laryngeal vestibule) tended to become laryngeal penetration, then aspiration, post swallow. The laryngeal penetration and aspiration were SILENT in nature w/ only a much delayed cough response noted intermittently. Small, single trials of thin liquids appeared best for efficiency of movement through the UES, however, laryngeal penetration w/ aspiration from the pharyngeal residue still occurred post swallow w/ 1 episode of laryngeal penetration during the swallow d/t the delayed pharyngeal swallow initiation occurring. The heavier the bolus consistency became (puree trials), the worse the pharyngeal residue became thus increasing risk for penetration/aspiration.  Pt was instructed in using swallowing strategies of effortful swallow, hard throat clear/cough, hock, and reswallow or expectorate(spit) to aid airway protection of the bolus material that had been penetrated and aspirated. Pt was able to follow through w/ these strategies once educated. These strategies were implemented w/ small, single sips of thin liquids during this study and appeared to allow pt to have the PLEASURE of the sips of thin liquids but be able to aid in clearing some of the penetration/aspiration(material) and aid in protecting his airway. These strategies are NOT fool-proof and cannot be counted on as being 100% effective. This was explained to pt and wife after his study.  Pt stated he wanted to be able to eat/drink again. The results of this study, use of strategies when swallowing, and risks for aspiration w/ Pulmonary compromise was explained to pt fully. As pt wants to be able to take sips of liquid for PLEASURE (as he stated maybe 3 times a week?), it was strongly recommended he consult w/ his primary MD/MDs for further information/discussion. Pt agreed.   PLAN/RECOMMENDATIONS:  A. Diet: NPO; continuation of TFs via PEG for full  nutrition/hydration needs  B. Swallowing Precautions: consider education/instruction on swallowing strategies and precautions of effortful swallow, hard throat clear/cough, hock, and reswallow/expectorate IF determined pt begins taking po thin liquids for PLEASURE(per his discussion w/ primary MD). Education on aspiration precautions; monitoring for s/s of aspiration other that the immediate, overt s/s. Learn to monitor for Pulmonary changes.   C. Recommended consultation to: primary MD for choices on quality of life  D. Therapy recommendations: continue f/u(education) on pharyngeal strengthening exercises; stringent oral care.   E. Results and recommendations were discussed w/ pt, wife; result of video viewed and discussed w/ pt in collaboration w/ Radiologist.      End of Session - 04/17/16 1410    Visit Number 1   Number of Visits 1   Date for SLP Re-Evaluation 04/17/16   SLP Start Time 57   SLP Stop Time  1400   SLP Time Calculation (min) 60 min   Activity Tolerance Patient tolerated treatment well      Past Medical History:  Diagnosis Date  . Anxiety   . Arthritis   . Barrett esophagus   . Basal cell carcinoma   . CAD (coronary artery disease)    on 2l home oxygen  . CHF (congestive heart failure) ([place])   . Chronic lower back pain   . COPD (chronic obstructive pulmonary disease) ([place])   . Depression   . DJD (degenerative joint disease)   . DVT (deep venous thrombosis) ([place]) 11/2013; 02/01/2015   on coumadin  . GERD (gastroesophageal reflux disease)   . 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 Heart attack 03/20/1995  . History of gout   . Hyperlipidemia   . Hypertension   . Hypothyroidism   . Iron deficiency anemia   .  Lumbar spinal stenosis   . Multiple pulmonary nodules   . On home oxygen therapy   . Parkinson's disease ([place])   . Recurrent aspiration pneumonia ([place])   . Seizures ([place])   . Sleep apnea   . Throat cancer  ([place])   . Type II diabetes mellitus ([place])     Past Surgical History:  Procedure Laterality Date  . ANTERIOR CERVICAL DECOMP/DISCECTOMY FUSION  X 2  . BACK SURGERY    . BASAL CELL CARCINOMA EXCISION    . CATARACT EXTRACTION W/ INTRAOCULAR LENS  IMPLANT, BILATERAL Bilateral   . CORONARY ANGIOPLASTY WITH STENT PLACEMENT    . CORONARY ARTERY BYPASS GRAFT  1996   "CABG X3"  . EXCISIONAL HEMORRHOIDECTOMY    . JOINT REPLACEMENT    . PEG PLACEMENT N/A 01/10/2016   Procedure: PERCUTANEOUS ENDOSCOPIC GASTROSTOMY (PEG) PLACEMENT;  Surgeon: Lucilla Lame, MD;  Location: [facility] ENDOSCOPY;  Service: Endoscopy;  Laterality: N/A;  . POSTERIOR FUSION CERVICAL SPINE  X 1  . RADICAL NECK DISSECTION Right ~ 1998   "throat cancer"  . THYROIDECTOMY  ~ 1996 X 2  . TONSILLECTOMY    . TOTAL KNEE ARTHROPLASTY Right 2011    There were no vitals filed for this visit.              Patient will benefit from skilled therapeutic intervention in order to improve the following deficits and impairments:   Dysphagia - Plan: DG OP Swallowing Func-Medicare/Speech Path, DG OP Swallowing Func-Medicare/Speech Path  Oropharyngeal dysphagia      G-Codes - Apr 18, 2016 1411    Functional Assessment Tool Used clinical judgement   Functional Limitations Swallowing   Swallow Current Status [id number]) At least 80 percent but less than 100 percent impaired, limited or restricted   Swallow Goal Status [id number]) At least 80 percent but less than 100 percent impaired, limited or restricted   Swallow Discharge Status [phone]) At least 80 percent but less than 100 percent impaired, limited or restricted          Problem List Patient Active Problem List   Diagnosis Date Noted  . Hypothyroidism 01/04/2016  . Malnutrition of moderate degree 12/12/2015  . Aspiration pneumonia ([place]) 11/29/2015  . Dehydration 03/07/2015  . Orthostatic hypotension 03/06/2015  . Hypotension 03/06/2015  . Constipation 02/05/2015  . Dysphagia  causing pulmonary aspiration with swallowing 02/03/2015  . Diskitis   . 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 Discitis of lumbar region 02/01/2015  . Epidural abscess 02/01/2015  . Warfarin anticoagulation 02/01/2015  . Parkinsons disease ([place]) 02/01/2015  . Vertebral osteomyelitis ([place]) 02/01/2015  . Dysphagia/chronic 02/01/2015  . Bacteremia due to group B Streptococcus 01/28/2015  . Acute on chronic respiratory failure with hypoxia ([place]) 01/26/2015  . Sepsis ([place]) 01/26/2015  . History of DVT (deep vein thrombosis) 02/20/2014  . 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 Multiple pulmonary nodules 01/15/2012  . Aspiration pneumonitis ([place]) 01/15/2012  . Hypertension 01/15/2012  . Barrett's esophagus 01/15/2012  . CAD (coronary artery disease) 01/15/2012  . COPD (chronic obstructive pulmonary disease) ([place]) 01/15/2012      Orinda Kenner, MS, CCC-SLP [place] April 18, 2016, 2:12 PM  [facility] [place], [place], [address] Phone: [phone]   Fax:     Name: John Mendoza MRN: [id number] Date of Birth: 10/17/1945

## 2016-05-19 NOTE — Progress Notes (Deleted)
[facility] Consultation      Assessment and Plan:  Hemoptysis.  --This is likely due to chronic bronchitis and airway drying from oxygen combined with coumadin. He tells me that his most recent INR was therapeutic.  --He does not have symptoms of acute bronchitis, and feels that his breathing is otherwise at baseline, he denies fevers. We will therefore monitor this, and check a sputum culture.  --He has a history of goodpasture's as well will monitor to see if this is contributory.  --If he develops hemoptysis recurrently will need to consider stopping coumadin.    Aspiration into respiratory tract --Currently NPO and fed via feeding tube.   COPD (chronic obstructive pulmonary disease) This has been a stable interval  Continue currently inhalers.   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  However, considering his recent diagnosis of parkinsonism we will need to keep a close eye on his balance and carefully evaluate his fall risk. Today he appears to be quite steady on his feet but we should keep this in mind as he ages.  MAI (mycobacterium avium-intracellulare) He grew out MAI from 1 out of 3 cultures in 2013.  We will continue to observe and watch for evidence of fevers, chills, weight loss, or purulent sputum production.  Multiple pulmonary nodules The patient has had numerous CT scans which have showed no change in the nodules. He has multiple reasons for which he may have lung nodules including previous MAC, recurrent aspiration, and goodpasture's. This combined with his possibly limited life span, I do not think he would be a good candidate for continued screening.   Date: 05/19/2016  MRN# [id number] John Mendoza 05/19/1946  Referring Physician:   DARINEL NOH is a 70 y.o. old male seen in consultation for chief complaint of:    No chief complaint on file.   HPI:   He was seen by Dr. Lake Bells in  January 2016 with recurrent cough as well as multiple bilateral pulmonary nodules, thought to be due to dysphagia and aspiration from recently diagnosed Parkinson's disease  He seen today as a hospital follow-up from approximately 1 month ago for aspiration pneumonia. He underwent a swallowing evaluation at that time was found to have severe oral pharyngeal dysphagia, and underwent PEG tube placement. Since getting out of the hospital, he feels that he is feeling better overall, his oxygen is above 95% at home without supplemental oxygen. He continues to use oxygen to be on 2L oxygen at night, he has portable oxygen but does not use it. He is going to start using an exercise bicycle.   This morning he has noted that he is coughing up blood, he has not had any nose bleeds.  He is not using any inhalers currently.  He has a diagnosis of Parkinsons disease, which he has been told that is advanced. He can not swallow and is currently on tube feedings.  Previous testing:  August 2012 CT chest [facility]: (My read) Left basilar groundglass opacity most consistent with an infectious process scattered nodules all less than 1 cm throughout both lobes. Emphysema noted some tree in bed opacities noted.  June 2013 CT chest [facility]: There is emphysema noted throughout and the the quality of the study on my computer is limited; I appreciate tree in bud opacities in the bases bilaterally. There are also multiple scattered nodules in both lungs. There is one pleural-based nodule in the left lung which unfortunately I am  unable to see completely which I measure at 1.6 cm.  01/2012 sputum AFB positive for MAI in 1 of 3 cultures  04/15/2012 CT chest [facility]>> no lymphadenopathy, no change in pulmonary nodules; LLL bronchiectasis, likely aspiration  11/15/2012 CT chest [facility] > multiple new subcentimeter pulm nodules in the LLL, indeterminate but likely infectious vs aspiration; slighly increased LLL  consodlidation likely due to chronic aspiration; diverticulosis but no diverticulitis; 2.4 cm lesion left lower lobe  02/2013 PET CT > no PET positive lesions identified; some consolidation noted in left lung as prior studies  06/2013 CT Chest > chronic scarring lower lobes, likely aspiration; nodules are unchanged  01/2014 CT chest > 61mm nodule RLL pleural based, scattered scarring and aspiration changes in bases unchanged  07/2014 CT chest > NO change in basilar atelectasis and nodules, images reviewed by me   PMHX:   Past Medical History:  Diagnosis Date  . Anxiety   . Arthritis   . Barrett esophagus   . Basal cell carcinoma   . CAD (coronary artery disease)    on 2l home oxygen  . CHF (congestive heart failure) ([place])   . Chronic lower back pain   . COPD (chronic obstructive pulmonary disease) ([place])   . Depression   . DJD (degenerative joint disease)   . DVT (deep venous thrombosis) ([place]) 11/2013; 02/01/2015   on coumadin  . GERD (gastroesophageal reflux disease)   . 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 Heart attack 03/20/1995  . History of gout   . Hyperlipidemia   . Hypertension   . Hypothyroidism   . Iron deficiency anemia   . Lumbar spinal stenosis   . Multiple pulmonary nodules   . On home oxygen therapy   . Parkinson's disease ([place])   . Recurrent aspiration pneumonia ([place])   . Seizures ([place])   . Sleep apnea   . Throat cancer ([place])   . Type II diabetes mellitus ([place])    Surgical Hx:  Past Surgical History:  Procedure Laterality Date  . ANTERIOR CERVICAL DECOMP/DISCECTOMY FUSION  X 2  . BACK SURGERY    . BASAL CELL CARCINOMA EXCISION    . CATARACT EXTRACTION W/ INTRAOCULAR LENS  IMPLANT, BILATERAL Bilateral   . CORONARY ANGIOPLASTY WITH STENT PLACEMENT    . CORONARY ARTERY BYPASS GRAFT  1996   "CABG X3"  . EXCISIONAL HEMORRHOIDECTOMY    . JOINT REPLACEMENT    . PEG PLACEMENT N/A 01/10/2016   Procedure:  PERCUTANEOUS ENDOSCOPIC GASTROSTOMY (PEG) PLACEMENT;  Surgeon: Lucilla Lame, MD;  Location: [facility] ENDOSCOPY;  Service: Endoscopy;  Laterality: N/A;  . POSTERIOR FUSION CERVICAL SPINE  X 1  . RADICAL NECK DISSECTION Right ~ 1998   "throat cancer"  . THYROIDECTOMY  ~ 1996 X 2  . TONSILLECTOMY    . TOTAL KNEE ARTHROPLASTY Right 2011  Medication:   Reviewed.     Allergies:  Other  Review of Systems: Gen:  Denies  fever, sweats, chills HEENT: Denies blurred vision, double vision Cvc:  No dizziness, chest pain. Resp:   Denies cough or sputum production, shortness of breath Gi: Denies  stomach pain. Gu:  Denies bladder incontinence, burning urine Ext:   No Joint pain, stiffness. Skin: No skin rash,  hives  Endoc:  No polyuria, polydipsia. Psych: No depression, insomnia. Other:  All other systems were reviewed with the patient and were negative other that what is mentioned in the HPI.  Physical Examination:   VS: There were no vitals taken for this visit.  General Appearance: No distress  Neuro:without focal findings,  speech normal,  HEENT: PERRLA, EOM intact.   Pulmonary: normal breath sounds, No wheezing.  CardiovascularNormal S1,S2.  No m/r/g.   Abdomen: Benign, Soft, non-tender. Renal:  No costovertebral tenderness  GU:  No performed at this time. Endoc: No evident thyromegaly, no signs of acromegaly. Skin:   warm, no rashes, no ecchymosis  Extremities: normal, no cyanosis, clubbing.  Other findings:    LABORATORY PANEL:   CBC No results for input(s): WBC, HGB, HCT, PLT in the last 168 hours. ------------------------------------------------------------------------------------------------------------------  Chemistries  No results for input(s): NA, K, CL, CO2, GLUCOSE, BUN, CREATININE, CALCIUM, MG, AST, ALT, ALKPHOS, BILITOT in the last 168 hours.  Invalid input(s):  GFRCGP ------------------------------------------------------------------------------------------------------------------  Cardiac Enzymes No results for input(s): TROPONINI in the last 168 hours. ------------------------------------------------------------  RADIOLOGY:  No results found.     Thank  you for the consultation and for allowing [facility] to assist in the care of your patient. Our recommendations are noted above.  Please contact us if we can be of further service.   Marda Stalker, MD.  Board Certified in Internal Medicine, Pulmonary Medicine, [place], and Sleep Medicine.  [facility] Office Number: [phone]  Patricia Pesa, M.D.  Vilinda Boehringer, M.D.  Merton Border, M.D  05/19/2016

## 2016-05-20 ENCOUNTER — Ambulatory Visit: Payer: Medicare Other | Admitting: Internal Medicine

## 2016-10-22 ENCOUNTER — Inpatient Hospital Stay
Admit: 2016-10-22 | Discharge: 2016-10-22 | Disposition: A | Discharge status: Home / Self Care | Payer: Medicare Other | Attending: Internal Medicine | Admitting: Internal Medicine

## 2016-10-22 ENCOUNTER — Emergency Department: Payer: Medicare Other

## 2016-10-22 ENCOUNTER — Inpatient Hospital Stay: Payer: Medicare Other

## 2016-10-22 ENCOUNTER — Encounter: Payer: Self-pay | Admitting: Emergency Medicine

## 2016-10-22 ENCOUNTER — Inpatient Hospital Stay
Admission: EM | Admit: 2016-10-22 | Discharge: 2016-10-27 | DRG: 177 | Disposition: A | Discharge status: Home Health | Payer: Medicare Other | Attending: Internal Medicine | Admitting: Internal Medicine

## 2016-10-22 DIAGNOSIS — G20 Parkinson's disease: Secondary | ICD-10-CM | POA: Diagnosis present

## 2016-10-22 DIAGNOSIS — Z8261 Family history of arthritis: Secondary | ICD-10-CM

## 2016-10-22 DIAGNOSIS — Z888 Allergy status to other drugs, medicaments and biological substances status: Secondary | ICD-10-CM

## 2016-10-22 DIAGNOSIS — E890 Postprocedural hypothyroidism: Secondary | ICD-10-CM | POA: Diagnosis present

## 2016-10-22 DIAGNOSIS — E43 Unspecified severe protein-calorie malnutrition: Secondary | ICD-10-CM | POA: Diagnosis present

## 2016-10-22 DIAGNOSIS — I248 Other forms of acute ischemic heart disease: Secondary | ICD-10-CM | POA: Diagnosis present

## 2016-10-22 DIAGNOSIS — I480 Paroxysmal atrial fibrillation: Secondary | ICD-10-CM | POA: Diagnosis present

## 2016-10-22 DIAGNOSIS — Z4659 Encounter for fitting and adjustment of other gastrointestinal appliance and device: Secondary | ICD-10-CM

## 2016-10-22 DIAGNOSIS — Z85828 Personal history of other malignant neoplasm of skin: Secondary | ICD-10-CM

## 2016-10-22 DIAGNOSIS — J208 Acute bronchitis due to other specified organisms: Secondary | ICD-10-CM | POA: Diagnosis not present

## 2016-10-22 DIAGNOSIS — J440 Chronic obstructive pulmonary disease with acute lower respiratory infection: Secondary | ICD-10-CM | POA: Diagnosis present

## 2016-10-22 DIAGNOSIS — G8929 Other chronic pain: Secondary | ICD-10-CM | POA: Diagnosis present

## 2016-10-22 DIAGNOSIS — Z85818 Personal history of malignant neoplasm of other sites of lip, oral cavity, and pharynx: Secondary | ICD-10-CM

## 2016-10-22 DIAGNOSIS — Z9841 Cataract extraction status, right eye: Secondary | ICD-10-CM

## 2016-10-22 DIAGNOSIS — Z7982 Long term (current) use of aspirin: Secondary | ICD-10-CM

## 2016-10-22 DIAGNOSIS — Z9842 Cataract extraction status, left eye: Secondary | ICD-10-CM

## 2016-10-22 DIAGNOSIS — M1990 Unspecified osteoarthritis, unspecified site: Secondary | ICD-10-CM | POA: Diagnosis present

## 2016-10-22 DIAGNOSIS — J181 Lobar pneumonia, unspecified organism: Secondary | ICD-10-CM

## 2016-10-22 DIAGNOSIS — Z681 Body mass index (BMI) 19 or less, adult: Secondary | ICD-10-CM

## 2016-10-22 DIAGNOSIS — Z931 Gastrostomy status: Secondary | ICD-10-CM

## 2016-10-22 DIAGNOSIS — I110 Hypertensive heart disease with heart failure: Secondary | ICD-10-CM | POA: Diagnosis present

## 2016-10-22 DIAGNOSIS — Z961 Presence of intraocular lens: Secondary | ICD-10-CM | POA: Diagnosis present

## 2016-10-22 DIAGNOSIS — G4730 Sleep apnea, unspecified: Secondary | ICD-10-CM | POA: Diagnosis present

## 2016-10-22 DIAGNOSIS — A419 Sepsis, unspecified organism: Secondary | ICD-10-CM | POA: Diagnosis present

## 2016-10-22 DIAGNOSIS — J9621 Acute and chronic respiratory failure with hypoxia: Secondary | ICD-10-CM | POA: Diagnosis not present

## 2016-10-22 DIAGNOSIS — I2510 Atherosclerotic heart disease of native coronary artery without angina pectoris: Secondary | ICD-10-CM | POA: Diagnosis present

## 2016-10-22 DIAGNOSIS — Z951 Presence of aortocoronary bypass graft: Secondary | ICD-10-CM

## 2016-10-22 DIAGNOSIS — Z96651 Presence of right artificial knee joint: Secondary | ICD-10-CM | POA: Diagnosis present

## 2016-10-22 DIAGNOSIS — I471 Supraventricular tachycardia: Secondary | ICD-10-CM | POA: Diagnosis present

## 2016-10-22 DIAGNOSIS — F419 Anxiety disorder, unspecified: Secondary | ICD-10-CM | POA: Diagnosis present

## 2016-10-22 DIAGNOSIS — I252 Old myocardial infarction: Secondary | ICD-10-CM

## 2016-10-22 DIAGNOSIS — R059 Cough, unspecified: Secondary | ICD-10-CM

## 2016-10-22 DIAGNOSIS — Z800 Family history of malignant neoplasm of digestive organs: Secondary | ICD-10-CM

## 2016-10-22 DIAGNOSIS — Z9981 Dependence on supplemental oxygen: Secondary | ICD-10-CM | POA: Diagnosis not present

## 2016-10-22 DIAGNOSIS — E785 Hyperlipidemia, unspecified: Secondary | ICD-10-CM | POA: Diagnosis present

## 2016-10-22 DIAGNOSIS — Z955 Presence of coronary angioplasty implant and graft: Secondary | ICD-10-CM

## 2016-10-22 DIAGNOSIS — Z8701 Personal history of pneumonia (recurrent): Secondary | ICD-10-CM

## 2016-10-22 DIAGNOSIS — E1151 Type 2 diabetes mellitus with diabetic peripheral angiopathy without gangrene: Secondary | ICD-10-CM | POA: Diagnosis present

## 2016-10-22 DIAGNOSIS — J9601 Acute respiratory failure with hypoxia: Secondary | ICD-10-CM | POA: Diagnosis not present

## 2016-10-22 DIAGNOSIS — I5022 Chronic systolic (congestive) heart failure: Secondary | ICD-10-CM | POA: Diagnosis present

## 2016-10-22 DIAGNOSIS — Z79899 Other long term (current) drug therapy: Secondary | ICD-10-CM

## 2016-10-22 DIAGNOSIS — Z806 Family history of leukemia: Secondary | ICD-10-CM

## 2016-10-22 DIAGNOSIS — I255 Ischemic cardiomyopathy: Secondary | ICD-10-CM | POA: Diagnosis present

## 2016-10-22 DIAGNOSIS — J189 Pneumonia, unspecified organism: Secondary | ICD-10-CM

## 2016-10-22 DIAGNOSIS — R05 Cough: Secondary | ICD-10-CM

## 2016-10-22 DIAGNOSIS — Z87891 Personal history of nicotine dependence: Secondary | ICD-10-CM

## 2016-10-22 DIAGNOSIS — Z882 Allergy status to sulfonamides status: Secondary | ICD-10-CM

## 2016-10-22 DIAGNOSIS — K219 Gastro-esophageal reflux disease without esophagitis: Secondary | ICD-10-CM | POA: Diagnosis present

## 2016-10-22 DIAGNOSIS — J15211 Pneumonia due to Methicillin susceptible Staphylococcus aureus: Principal | ICD-10-CM | POA: Diagnosis present

## 2016-10-22 DIAGNOSIS — Z981 Arthrodesis status: Secondary | ICD-10-CM

## 2016-10-22 DIAGNOSIS — Z86718 Personal history of other venous thrombosis and embolism: Secondary | ICD-10-CM

## 2016-10-22 DIAGNOSIS — R778 Other specified abnormalities of plasma proteins: Secondary | ICD-10-CM

## 2016-10-22 DIAGNOSIS — M545 Low back pain: Secondary | ICD-10-CM | POA: Diagnosis present

## 2016-10-22 DIAGNOSIS — I4719 Other supraventricular tachycardia: Secondary | ICD-10-CM

## 2016-10-22 DIAGNOSIS — R7989 Other specified abnormal findings of blood chemistry: Secondary | ICD-10-CM

## 2016-10-22 DIAGNOSIS — J9811 Atelectasis: Secondary | ICD-10-CM | POA: Diagnosis present

## 2016-10-22 DIAGNOSIS — Z7901 Long term (current) use of anticoagulants: Secondary | ICD-10-CM

## 2016-10-22 DIAGNOSIS — J9690 Respiratory failure, unspecified, unspecified whether with hypoxia or hypercapnia: Secondary | ICD-10-CM

## 2016-10-22 LAB — CBC WITH DIFFERENTIAL/PLATELET
BASOS PCT: 0 %
Basophils Absolute: 0 10*3/uL (ref 0–0.1)
EOS PCT: 0 %
Eosinophils Absolute: 0 10*3/uL (ref 0–0.7)
HEMATOCRIT: 33.5 % — AB (ref 40.0–52.0)
Hemoglobin: 11 g/dL — ABNORMAL LOW (ref 13.0–18.0)
LYMPHS ABS: 0.4 10*3/uL — AB (ref 1.0–3.6)
Lymphocytes Relative: 8 %
MCH: 27.3 pg (ref 26.0–34.0)
MCHC: 32.8 g/dL (ref 32.0–36.0)
MCV: 83.3 fL (ref 80.0–100.0)
MONOS PCT: 13 %
Monocytes Absolute: 0.6 10*3/uL (ref 0.2–1.0)
NEUTROS PCT: 79 %
Neutro Abs: 3.7 10*3/uL (ref 1.4–6.5)
Platelets: 148 10*3/uL — ABNORMAL LOW (ref 150–440)
RBC: 4.02 MIL/uL — AB (ref 4.40–5.90)
RDW: 17.1 % — ABNORMAL HIGH (ref 11.5–14.5)
WBC: 4.7 10*3/uL (ref 3.8–10.6)

## 2016-10-22 LAB — INFLUENZA PANEL BY PCR (TYPE A & B)
INFLAPCR: NEGATIVE
INFLBPCR: NEGATIVE

## 2016-10-22 LAB — BASIC METABOLIC PANEL
ANION GAP: 9 (ref 5–15)
BUN: 27 mg/dL — ABNORMAL HIGH (ref 6–20)
CO2: 29 mmol/L (ref 22–32)
Calcium: 9.1 mg/dL (ref 8.9–10.3)
Chloride: 97 mmol/L — ABNORMAL LOW (ref 101–111)
Creatinine, Ser: 0.6 mg/dL — ABNORMAL LOW (ref 0.61–1.24)
GFR calc Af Amer: >60 mL/min (ref >60)
GFR calc non Af Amer: >60 mL/min (ref >60)
GLUCOSE: 98 mg/dL (ref 65–99)
POTASSIUM: 4.2 mmol/L (ref 3.5–5.1)
Sodium: 135 mmol/L (ref 135–145)

## 2016-10-22 LAB — GLUCOSE, CAPILLARY
Glucose-Capillary: 155 mg/dL — ABNORMAL HIGH (ref 65–99)
Glucose-Capillary: 144 mg/dL — ABNORMAL HIGH (ref 65–99)

## 2016-10-22 LAB — BLOOD GAS, ARTERIAL
Acid-Base Excess: 9.1 mmol/L — ABNORMAL HIGH (ref 0.0–2.0)
Bicarbonate: 34.6 mmol/L — ABNORMAL HIGH (ref 20.0–28.0)
FIO2: 1
O2 Saturation: 80.6 %
PH ART: 7.44 (ref 7.350–7.450)
Patient temperature: 37
pCO2 arterial: 51 mmHg — ABNORMAL HIGH (ref 32.0–48.0)
pO2, Arterial: 43 mmHg — ABNORMAL LOW (ref 83.0–108.0)

## 2016-10-22 LAB — EXPECTORATED SPUTUM ASSESSMENT W REFEX TO RESP CULTURE

## 2016-10-22 LAB — MRSA PCR SCREENING: MRSA by PCR: NEGATIVE

## 2016-10-22 LAB — EXPECTORATED SPUTUM ASSESSMENT W GRAM STAIN, RFLX TO RESP C

## 2016-10-22 LAB — TROPONIN I
Troponin I: 0.04 ng/mL (ref <0.03)
Troponin I: 0.03 ng/mL (ref <0.03)

## 2016-10-22 LAB — TSH: TSH: 0.859 u[IU]/mL (ref 0.350–4.500)

## 2016-10-22 LAB — PROTIME-INR
INR: 1.46
PROTHROMBIN TIME: 17.9 s — AB (ref 11.4–15.2)

## 2016-10-22 LAB — LACTIC ACID, PLASMA
LACTIC ACID, VENOUS: 1.6 mmol/L (ref 0.5–1.9)
LACTIC ACID, VENOUS: 1.6 mmol/L (ref 0.5–1.9)

## 2016-10-22 LAB — BRAIN NATRIURETIC PEPTIDE: B Natriuretic Peptide: 601 pg/mL — ABNORMAL HIGH (ref 0.0–100.0)

## 2016-10-22 MED ORDER — IPRATROPIUM BROMIDE 0.02 % IN SOLN: 0.5000 mg | RESPIRATORY_TRACT | Status: DC

## 2016-10-22 MED ORDER — ACETAMINOPHEN 500 MG PO TABS
500.0000 mg | ORAL_TABLET | Freq: Four times a day (QID) | ORAL | Status: DC | PRN
  Administered 2016-10-23 – 2016-10-24 (×2): 500 mg via ORAL
  Filled 2016-10-22 (×3): qty 1

## 2016-10-22 MED ORDER — CLONAZEPAM 0.5 MG PO TABS
1.5000 mg | ORAL_TABLET | Freq: Every day | ORAL | Status: DC
  Administered 2016-10-22: 1.5 mg via ORAL
  Filled 2016-10-22: qty 3

## 2016-10-22 MED ORDER — HYDROCODONE-ACETAMINOPHEN 5-325 MG PO TABS
1.0000 | ORAL_TABLET | Freq: Every day | ORAL | Status: DC
  Administered 2016-10-22: 1 via ORAL
  Filled 2016-10-22 (×2): qty 1

## 2016-10-22 MED ORDER — WARFARIN SODIUM 2 MG PO TABS
6.0000 mg | ORAL_TABLET | Freq: Every evening | ORAL | Status: DC
  Administered 2016-10-22: 6 mg via ORAL
  Filled 2016-10-22: qty 1

## 2016-10-22 MED ORDER — METFORMIN HCL 500 MG PO TABS
500.0000 mg | ORAL_TABLET | Freq: Two times a day (BID) | ORAL | Status: DC
Start: 2016-10-22 — End: 2016-10-23
  Administered 2016-10-22: 500 mg via ORAL
  Filled 2016-10-22: qty 1

## 2016-10-22 MED ORDER — CARBIDOPA-LEVODOPA 25-250 MG PO TABS
1.0000 | ORAL_TABLET | Freq: Three times a day (TID) | ORAL | Status: DC
  Filled 2016-10-22: qty 1

## 2016-10-22 MED ORDER — CEFTRIAXONE SODIUM-DEXTROSE 1-3.74 GM-% IV SOLR
1.0000 g | Freq: Once | INTRAVENOUS | Status: AC
  Administered 2016-10-22: 1 g via INTRAVENOUS
  Filled 2016-10-22: qty 50

## 2016-10-22 MED ORDER — LOSARTAN POTASSIUM 25 MG PO TABS
25.0000 mg | ORAL_TABLET | Freq: Every day | ORAL | Status: DC
  Administered 2016-10-22: 25 mg via ORAL
  Filled 2016-10-22: qty 1

## 2016-10-22 MED ORDER — HYDROCOD POLST-CPM POLST ER 10-8 MG/5ML PO SUER
5.0000 mL | Freq: Two times a day (BID) | ORAL | Status: DC
  Administered 2016-10-22 – 2016-10-25 (×7): 5 mL
  Filled 2016-10-22 (×7): qty 5

## 2016-10-22 MED ORDER — GUAIFENESIN-DM 100-10 MG/5ML PO SYRP
5.0000 mL | ORAL_SOLUTION | ORAL | Status: DC | PRN
  Administered 2016-10-25: 5 mL
  Filled 2016-10-22 (×2): qty 5

## 2016-10-22 MED ORDER — IPRATROPIUM-ALBUTEROL 0.5-2.5 (3) MG/3ML IN SOLN
3.0000 mL | Freq: Once | RESPIRATORY_TRACT | Status: AC
  Administered 2016-10-22: 3 mL via RESPIRATORY_TRACT
  Filled 2016-10-22: qty 3

## 2016-10-22 MED ORDER — NITROGLYCERIN 0.4 MG SL SUBL: 0.4000 mg | SUBLINGUAL_TABLET | SUBLINGUAL | Status: DC | PRN

## 2016-10-22 MED ORDER — CARBIDOPA-LEVODOPA 25-250 MG PO TABS
1.0000 | ORAL_TABLET | Freq: Three times a day (TID) | ORAL | Status: DC
  Administered 2016-10-22: 1
  Filled 2016-10-22: qty 1

## 2016-10-22 MED ORDER — IPRATROPIUM-ALBUTEROL 0.5-2.5 (3) MG/3ML IN SOLN
3.0000 mL | RESPIRATORY_TRACT | Status: DC
  Administered 2016-10-22 – 2016-10-25 (×12): 3 mL via RESPIRATORY_TRACT
  Filled 2016-10-22 (×12): qty 3

## 2016-10-22 MED ORDER — VANCOMYCIN HCL IN DEXTROSE 750-5 MG/150ML-% IV SOLN: 750.0000 mg | Freq: Two times a day (BID) | INTRAVENOUS | Status: DC

## 2016-10-22 MED ORDER — ARIPIPRAZOLE 10 MG PO TABS
5.0000 mg | ORAL_TABLET | Freq: Two times a day (BID) | ORAL | Status: DC
  Administered 2016-10-22: 5 mg via ORAL
  Filled 2016-10-22: qty 1

## 2016-10-22 MED ORDER — VANCOMYCIN HCL IN DEXTROSE 1-5 GM/200ML-% IV SOLN
1000.0000 mg | Freq: Once | INTRAVENOUS | Status: DC
  Filled 2016-10-22 (×2): qty 200

## 2016-10-22 MED ORDER — WARFARIN - PHYSICIAN DOSING INPATIENT: Freq: Every day | Status: DC

## 2016-10-22 MED ORDER — ASPIRIN EC 81 MG PO TBEC
81.0000 mg | DELAYED_RELEASE_TABLET | Freq: Every day | ORAL | Status: DC
  Administered 2016-10-22: 81 mg via ORAL
  Filled 2016-10-22 (×2): qty 1

## 2016-10-22 MED ORDER — ALBUTEROL SULFATE (2.5 MG/3ML) 0.083% IN NEBU: 2.5000 mg | INHALATION_SOLUTION | RESPIRATORY_TRACT | Status: DC

## 2016-10-22 MED ORDER — METOPROLOL TARTRATE 5 MG/5ML IV SOLN
5.0000 mg | Freq: Once | INTRAVENOUS | Status: AC
  Administered 2016-10-22: 5 mg via INTRAVENOUS

## 2016-10-22 MED ORDER — DILTIAZEM HCL 25 MG/5ML IV SOLN: 20.0000 mg | Freq: Once | INTRAVENOUS | Status: DC

## 2016-10-22 MED ORDER — METOPROLOL TARTRATE 5 MG/5ML IV SOLN
INTRAVENOUS | Status: AC
  Administered 2016-10-22: 18:00:00
  Filled 2016-10-22: qty 5

## 2016-10-22 MED ORDER — FUROSEMIDE 10 MG/ML IJ SOLN
20.0000 mg | Freq: Once | INTRAMUSCULAR | Status: AC
  Administered 2016-10-22: 20 mg via INTRAVENOUS
  Filled 2016-10-22: qty 4

## 2016-10-22 MED ORDER — POLYETHYLENE GLYCOL 3350 17 G PO PACK
17.0000 g | PACK | Freq: Every day | ORAL | Status: DC | PRN
  Administered 2016-10-25: 17 g
  Filled 2016-10-22: qty 1

## 2016-10-22 MED ORDER — INSULIN ASPART 100 UNIT/ML ~~LOC~~ SOLN: 0.0000 [IU] | Freq: Every day | SUBCUTANEOUS | Status: DC

## 2016-10-22 MED ORDER — SODIUM CHLORIDE 0.9% FLUSH
3.0000 mL | Freq: Two times a day (BID) | INTRAVENOUS | Status: DC
  Administered 2016-10-22 – 2016-10-27 (×10): 3 mL via INTRAVENOUS

## 2016-10-22 MED ORDER — ONDANSETRON HCL 4 MG PO TABS: 4.0000 mg | ORAL_TABLET | Freq: Four times a day (QID) | ORAL | Status: DC | PRN

## 2016-10-22 MED ORDER — DILTIAZEM HCL 100 MG IV SOLR
5.0000 mg/h | INTRAVENOUS | Status: DC
  Administered 2016-10-22: 5 mg/h via INTRAVENOUS
  Administered 2016-10-22: 10 mg/h via INTRAVENOUS
  Filled 2016-10-22: qty 100

## 2016-10-22 MED ORDER — SODIUM CHLORIDE 0.9 % IV SOLN
INTRAVENOUS | Status: DC
  Administered 2016-10-22: via INTRAVENOUS

## 2016-10-22 MED ORDER — SODIUM CHLORIDE 0.9% FLUSH: 3.0000 mL | INTRAVENOUS | Status: DC | PRN

## 2016-10-22 MED ORDER — SODIUM CHLORIDE 0.9% FLUSH
3.0000 mL | Freq: Two times a day (BID) | INTRAVENOUS | Status: DC
  Administered 2016-10-23 – 2016-10-27 (×8): 3 mL via INTRAVENOUS

## 2016-10-22 MED ORDER — PAROXETINE HCL 20 MG PO TABS
60.0000 mg | ORAL_TABLET | Freq: Every day | ORAL | Status: DC
  Filled 2016-10-22: qty 3

## 2016-10-22 MED ORDER — FENTANYL 50 MCG/HR TD PT72: 50.0000 ug | MEDICATED_PATCH | TRANSDERMAL | Status: DC

## 2016-10-22 MED ORDER — VANCOMYCIN HCL 1000 MG IV SOLR
750.0000 mg | Freq: Two times a day (BID) | INTRAVENOUS | Status: DC
  Administered 2016-10-23 (×2): 750 mg via INTRAVENOUS
  Filled 2016-10-22 (×3): qty 750

## 2016-10-22 MED ORDER — CARBIDOPA-LEVODOPA 25-250 MG PO TABS
2.0000 | ORAL_TABLET | Freq: Three times a day (TID) | ORAL | Status: DC
  Administered 2016-10-22 (×2): 2 via ORAL
  Filled 2016-10-22 (×3): qty 2

## 2016-10-22 MED ORDER — ONDANSETRON HCL 4 MG/2ML IJ SOLN: 4.0000 mg | Freq: Four times a day (QID) | INTRAMUSCULAR | Status: DC | PRN

## 2016-10-22 MED ORDER — INSULIN ASPART 100 UNIT/ML ~~LOC~~ SOLN
0.0000 [IU] | Freq: Three times a day (TID) | SUBCUTANEOUS | Status: DC
  Administered 2016-10-23: 2 [IU] via SUBCUTANEOUS
  Administered 2016-10-24 (×2): 1 [IU] via SUBCUTANEOUS
  Administered 2016-10-25: 2 [IU] via SUBCUTANEOUS
  Administered 2016-10-25: 1 [IU] via SUBCUTANEOUS
  Administered 2016-10-26 – 2016-10-27 (×2): 2 [IU] via SUBCUTANEOUS
  Administered 2016-10-27: 1 [IU] via SUBCUTANEOUS
  Filled 2016-10-22: qty 1
  Filled 2016-10-22 (×5): qty 2
  Filled 2016-10-22 (×2): qty 1

## 2016-10-22 MED ORDER — VANCOMYCIN HCL IN DEXTROSE 1-5 GM/200ML-% IV SOLN
1000.0000 mg | Freq: Once | INTRAVENOUS | Status: AC
  Administered 2016-10-22: 1000 mg via INTRAVENOUS
  Filled 2016-10-22: qty 200

## 2016-10-22 MED ORDER — ATORVASTATIN CALCIUM 20 MG PO TABS
40.0000 mg | ORAL_TABLET | Freq: Every day | ORAL | Status: DC
  Administered 2016-10-22: 40 mg via ORAL
  Filled 2016-10-22: qty 2

## 2016-10-22 MED ORDER — SODIUM CHLORIDE 0.9 % IV SOLN
250.0000 mL | INTRAVENOUS | Status: DC | PRN
  Administered 2016-10-23: 250 mL via INTRAVENOUS

## 2016-10-22 MED ORDER — LEVOTHYROXINE SODIUM 25 MCG PO TABS: 137.0000 ug | ORAL_TABLET | Freq: Every day | ORAL | Status: DC

## 2016-10-22 MED ORDER — METOPROLOL TARTRATE 25 MG PO TABS
25.0000 mg | ORAL_TABLET | Freq: Four times a day (QID) | ORAL | Status: DC | PRN
  Administered 2016-10-22: 25 mg via ORAL
  Filled 2016-10-22: qty 1

## 2016-10-22 MED ORDER — BISOPROLOL FUMARATE 10 MG PO TABS
10.0000 mg | ORAL_TABLET | Freq: Every day | ORAL | Status: DC
  Filled 2016-10-22: qty 1

## 2016-10-22 MED ORDER — DEXTROSE 5 % IV SOLN
1.0000 g | Freq: Once | INTRAVENOUS | Status: DC
  Filled 2016-10-22: qty 10

## 2016-10-22 MED ORDER — LAMOTRIGINE 25 MG PO TABS
25.0000 mg | ORAL_TABLET | Freq: Two times a day (BID) | ORAL | Status: DC
  Administered 2016-10-22: 25 mg via ORAL
  Filled 2016-10-22 (×2): qty 1

## 2016-10-22 MED ORDER — TAMSULOSIN HCL 0.4 MG PO CAPS
0.4000 mg | ORAL_CAPSULE | Freq: Every day | ORAL | Status: DC
  Administered 2016-10-22: 0.4 mg via ORAL
  Filled 2016-10-22: qty 1

## 2016-10-22 MED ORDER — DEXTROSE 5 % IV SOLN
500.0000 mg | Freq: Once | INTRAVENOUS | Status: AC
  Administered 2016-10-22: 500 mg via INTRAVENOUS
  Filled 2016-10-22: qty 500

## 2016-10-22 MED ORDER — MAGNESIUM CITRATE PO SOLN
1.0000 | Freq: Once | ORAL | Status: DC | PRN
  Filled 2016-10-22: qty 296

## 2016-10-22 MED ORDER — GUAIFENESIN-DM 100-10 MG/5ML PO SYRP: 5.0000 mL | ORAL_SOLUTION | ORAL | Status: DC | PRN

## 2016-10-22 MED ORDER — HYDROCOD POLST-CPM POLST ER 10-8 MG/5ML PO SUER: 5.0000 mL | Freq: Two times a day (BID) | ORAL | Status: DC

## 2016-10-22 MED ORDER — CEFEPIME-DEXTROSE 1 GM/50ML IV SOLR
1.0000 g | Freq: Three times a day (TID) | INTRAVENOUS | Status: DC
  Administered 2016-10-22 – 2016-10-25 (×9): 1 g via INTRAVENOUS
  Filled 2016-10-22 (×11): qty 50

## 2016-10-22 MED ORDER — BISACODYL 10 MG RE SUPP
10.0000 mg | Freq: Every day | RECTAL | Status: DC | PRN
  Administered 2016-10-25 – 2016-10-26 (×2): 10 mg via RECTAL
  Filled 2016-10-22 (×3): qty 1

## 2016-10-22 NOTE — Progress Notes (Signed)
Pharmacy Antibiotic Note  John Mendoza is a 71 y.o. male admitted on 10/22/2016 with pneumonia.  Pharmacy has been consulted for vancomycin dosing. Pt received one dose vancomycin 1000 mg IV x 1 in ED  Plan: Begin vancomycin 750 IV q 12 hours ~6 hours stacked dosing. Cmin at steady state estimated to be 16.5. Will draw trough prior to 5th dose which should represent steady state.  PK: t1/2 11 Ke=0.063 Vd=41.3  Pt also receiving cefepime 1 g IV q 8 hours  Height: 5\' 9"  (175.3 cm) Weight: 130 lb (59 kg) IBW/kg (Calculated) : 70.7  Temp (24hrs), Avg:99.4 F (37.4 C), Min:99.4 F (37.4 C), Max:99.4 F (37.4 C)   Recent Labs Lab 10/22/16 1116 10/22/16 1609  WBC 4.7  --   CREATININE 0.60*  --   LATICACIDVEN  --  1.6    Estimated Creatinine Clearance: 70.7 mL/min (A) (by C-G formula based on SCr of 0.6 mg/dL (L)).    Allergies  Allergen Reactions  . Other Other (See Comments)    Dizziness, lightheadedness, Pt states that inhaled medications make him depressed and have negative thoughts.    . Sulfa Antibiotics Rash    Headache    Antimicrobials this admission: azithromycin 4/18 x 1 ceftriaxone 4/18 x 1 Cefepime 4/18 >> Vancomycin 4/18 >>  Dose adjustments this admission:  Microbiology results: 4/18 BCx: sent  UCx:   4/18 Sputum: sent  4/18 MRSA PCR: sent  Thank you for allowing pharmacy to be a part of this patient's care.  Darrow Bussing, PharmD Pharmacy Resident 10/22/2016 5:37 PM

## 2016-10-22 NOTE — Progress Notes (Signed)
HR elevated and sustaining in 150's -- up to 200 even after IV/PO metoprolol given.  Cardizem gtt started.  Continue to monitor.

## 2016-10-22 NOTE — ED Notes (Signed)
Patient gone for testing 

## 2016-10-22 NOTE — ED Triage Notes (Signed)
Pt presents to ED via [facility] from home c/o possible PNA per his home health staff. EMS report that pt was diagnosed with aspiration PNA yesterday and put on abt but was unable to afford his copay so was sent to ED for evaluation. Pt has hx DM, throat CA, has PEG tube in place and is NPO at baseline. +wet cough, pt states he is unable to expectorate anything. Denies chest pain or SOB, on 2L chronically as needed.

## 2016-10-22 NOTE — H&P (Signed)
at [place] NAME: Keiston Manley    MR#:  [id number]  DATE OF BIRTH:  [birth year]  DATE OF ADMISSION:  10/22/2016  PRIMARY CARE PHYSICIAN: Tama High III, MD   REQUESTING/REFERRING PHYSICIAN:   CHIEF COMPLAINT:   Chief Complaint  Patient presents with  . Pneumonia    HISTORY OF PRESENT ILLNESS: John Mendoza  is a 71 y.o. 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 fib, Parkinson's disease, who presents to the hospital with complaints of not feeling well for the past 5-6 days, coughing, producing green thick phlegm, running low-grade fever to 100.8 maximum, short of breath more than usual, using oxygen more frequently, having chills, lightheadedness, feeling presyncopal, fatigued and weak. On arrival to emergency room, he was noted to be tachycardic with heart rate around 120, in unifocal ectopic atrial tachycardia, chest x-ray was concerning for left lower lobe pneumonia, hospitalist services were contacted for admission.  PAST MEDICAL HISTORY:   Past Medical History:  Diagnosis Date  . Anxiety   . Arthritis   . Barrett esophagus   . Basal cell carcinoma   . CAD (coronary artery disease)    on 2l home oxygen  . CHF (congestive heart failure) ([place])   . Chronic lower back pain   . COPD (chronic obstructive pulmonary disease) ([place])   . Depression   . DJD (degenerative joint disease)   . DVT (deep venous thrombosis) ([place]) 11/2013; 02/01/2015   on coumadin  . GERD (gastroesophageal reflux disease)   . 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 Heart attack ([place]) 03/20/1995  . History of gout   . Hyperlipidemia   . Hypertension   .  Hypothyroidism   . Iron deficiency anemia   . Lumbar spinal stenosis   . Multiple pulmonary nodules   . On home oxygen therapy   . Parkinson's disease ([place])   . Recurrent aspiration pneumonia ([place])   . Seizures ([place])   . Sleep apnea   . Throat cancer ([place])   . Type II diabetes mellitus ([place])     PAST SURGICAL HISTORY: Past Surgical History:  Procedure Laterality Date  . ANTERIOR CERVICAL DECOMP/DISCECTOMY FUSION  X 2  . BACK SURGERY    . BASAL CELL CARCINOMA EXCISION    . CATARACT EXTRACTION W/ INTRAOCULAR LENS  IMPLANT, BILATERAL Bilateral   . CORONARY ANGIOPLASTY WITH STENT PLACEMENT    . CORONARY ARTERY BYPASS GRAFT  1996   "CABG X3"  . EXCISIONAL HEMORRHOIDECTOMY    . JOINT REPLACEMENT    . PEG PLACEMENT N/A 01/10/2016   Procedure: PERCUTANEOUS ENDOSCOPIC GASTROSTOMY (PEG) PLACEMENT;  Surgeon: Lucilla Lame, MD;  Location: [facility] ENDOSCOPY;  Service: Endoscopy;  Laterality: N/A;  . POSTERIOR FUSION CERVICAL SPINE  X 1  . RADICAL NECK DISSECTION Right ~ 1998   "throat cancer"  . THYROIDECTOMY  ~ 1996 X 2  . TONSILLECTOMY    . TOTAL KNEE ARTHROPLASTY Right 2011    SOCIAL HISTORY:  Social History  Substance Use Topics  . Smoking status: Former Smoker    Packs/day: 2.00    Years: 35.00    Types: Cigarettes    Quit date: 03/20/1995  . Smokeless tobacco: Never Used  . Alcohol  use No     Comment: 02/01/2015 "stopped drinking in ~ 1996; never had problem w/it"    FAMILY HISTORY:  Family History  Problem Relation Age of Onset  . Leukemia Paternal Grandmother   . Cancer Maternal Grandmother     stomach  . Cancer Paternal Aunt   . Other Father     bacterial endocarditis  . Rheum arthritis Father     DRUG ALLERGIES:  Allergies  Allergen Reactions  . Other Other (See Comments)    Dizziness, lightheadedness, Pt states that inhaled medications make him depressed and have negative thoughts.    . Sulfa Antibiotics Rash    Headache    Review of Systems  Constitutional:  Positive for chills, diaphoresis, fever, malaise/fatigue and weight loss.  HENT: Negative for congestion.   Eyes: Negative for blurred vision and double vision.  Respiratory: Positive for cough, hemoptysis, sputum production and shortness of breath. Negative for wheezing.   Cardiovascular: Positive for chest pain and palpitations. Negative for orthopnea, leg swelling and PND.  Gastrointestinal: Positive for constipation. Negative for abdominal pain, blood in stool, diarrhea, nausea and vomiting.  Genitourinary: Negative for dysuria, frequency, hematuria and urgency.  Musculoskeletal: Positive for back pain. Negative for falls.  Neurological: Positive for weakness. Negative for dizziness, tremors, focal weakness and headaches.  Endo/Heme/Allergies: Does not bruise/bleed easily.  Psychiatric/Behavioral: Negative for depression. The patient does not have insomnia.     MEDICATIONS AT HOME:  Prior to Admission medications   Medication Sig Start Date End Date Taking? Authorizing Provider  acetaminophen (TYLENOL) 500 MG tablet Take 500 mg by mouth every 6 (six) hours as needed.   Yes Historical Provider, MD  ARIPiprazole (ABILIFY) 5 MG tablet Take 5 mg by mouth 2 (two) times daily.   Yes Historical Provider, MD  aspirin EC 81 MG tablet Take 81 mg by mouth at bedtime.    Yes Historical Provider, MD  atorvastatin (LIPITOR) 40 MG tablet Take 40 mg by mouth at bedtime.   Yes Historical Provider, MD  bisoprolol (ZEBETA) 10 MG tablet Take 10 mg by mouth daily.   Yes Historical Provider, MD  carbidopa-levodopa (SINEMET IR) 25-250 MG tablet Take 2 tablets by mouth 3 (three) times daily.    Yes Historical Provider, MD  clonazePAM (KLONOPIN) 1 MG tablet Take 1-1.5 mg by mouth at bedtime. Take 1 mg in the morning and 1.5 mg at night. 09/20/14  Yes Historical Provider, MD  HYDROcodone-acetaminophen (NORCO/VICODIN) 5-325 MG tablet Take 1 tablet by mouth at bedtime.    Yes Historical Provider, MD  lamoTRIgine  (LAMICTAL) 25 MG tablet Take 25 mg by mouth 2 (two) times daily.   Yes Historical Provider, MD  levothyroxine (SYNTHROID, LEVOTHROID) 137 MCG tablet Take 137 mcg by mouth daily.   Yes Historical Provider, MD  losartan (COZAAR) 25 MG tablet Take 25 mg by mouth at bedtime. 01/04/16  Yes Historical Provider, MD  metFORMIN (GLUCOPHAGE) 500 MG tablet Take 500 mg by mouth 2 (two) times daily. 12/12/14  Yes Historical Provider, MD  nitroGLYCERIN (NITROSTAT) 0.4 MG SL tablet Place 0.4 mg under the tongue every 5 (five) minutes x 3 doses as needed for chest pain.    Yes Historical Provider, MD  omeprazole (PRILOSEC) 20 MG capsule Take 20 mg by mouth 2 (two) times daily before a meal.    Yes Historical Provider, MD  PARoxetine (PAXIL) 20 MG tablet Take 60 mg by mouth daily.  07/30/10  Yes Historical Provider, MD  tamsulosin (FLOMAX) 0.4 MG  CAPS Take 0.4 mg by mouth daily.    Yes Historical Provider, MD  triamcinolone cream (KENALOG) 0.5 % Apply 1 application topically 2 (two) times daily as needed (rash).  11/07/15  Yes Historical Provider, MD  warfarin (COUMADIN) 5 MG tablet Take 1 tablet (5 mg total) by mouth daily. Patient taking differently: Take 6 mg by mouth daily.  03/08/15  Yes Adin Hector, MD  bisacodyl (DULCOLAX) 10 MG suppository Place 1 suppository (10 mg total) rectally daily as needed for moderate constipation. Patient not taking: Reported on 10/22/2016 02/01/15   Adin Hector, MD  fentaNYL (DURAGESIC - DOSED MCG/HR) 50 MCG/HR Place 1 patch (50 mcg total) onto the skin every 3 (three) days. Patient not taking: Reported on 10/22/2016 02/08/15   Orson Eva, MD  Respiratory Therapy Supplies (FLUTTER) DEVI USE AS DIRECTED 02/21/16   Laverle Hobby, MD  sulfamethoxazole-trimethoprim (BACTRIM DS) 800-160 MG tablet Take 1 tablet by mouth 2 (two) times daily. Patient not taking: Reported on 10/22/2016 01/12/16   Carrie Mew, MD  sulfamethoxazole-trimethoprim (BACTRIM DS,SEPTRA DS) 800-160 MG tablet  Take 1 tablet by mouth 2 (two) times daily. X 14 days Patient not taking: Reported on 10/22/2016 02/27/16   Laverle Hobby, MD      PHYSICAL EXAMINATION:   VITAL SIGNS: Blood pressure (!) 157/97, pulse (!) 109, temperature 99.4 F (37.4 C), temperature source Oral, resp. rate (!) 23, height 5\' 9"  (1.753 m), weight 59 kg (130 lb), SpO2 98 %.  GENERAL:  72 y.o.-year-old patient lying in the bed In moderate respiratory distress, tachypneic, uncomfortable, dyspneic.  EYES: Pupils equal, round, reactive to light and accommodation. No scleral icterus. Extraocular muscles intact.  HEENT: Head atraumatic, normocephalic. Oropharynx and nasopharynx clear.  NECK:  Supple, no jugular venous distention. No thyroid enlargement, no tenderness.  LUNGS: Diminished breath sounds bilaterally, no wheezing, bilateral, more left sided rales,rhonchi and crepitations. Using  accessory muscles of respiration with movements, speech.  CARDIOVASCULAR: S1, S2 regular, tachycardic. No murmurs, rubs, or gallops.  ABDOMEN: Soft, nontender, nondistended. Bowel sounds present. No organomegaly or mass. . G-tube is present EXTREMITIES: No pedal edema, cyanosis, or clubbing.  NEUROLOGIC: Cranial nerves II through XII are intact. Muscle strength 5/5 in all extremities. Sensation intact. Gait not checked.  PSYCHIATRIC: The patient is alert and oriented x 3.  SKIN: No obvious rash, lesion, or ulcer.   LABORATORY PANEL:   CBC  Recent Labs Lab 10/22/16 1116  WBC 4.7  HGB 11.0*  HCT 33.5*  PLT 148*  MCV 83.3  MCH 27.3  MCHC 32.8  RDW 17.1*  LYMPHSABS 0.4*  MONOABS 0.6  EOSABS 0.0  BASOSABS 0.0   ------------------------------------------------------------------------------------------------------------------  Chemistries   Recent Labs Lab 10/22/16 1116  NA 135  K 4.2  CL 97*  CO2 29  GLUCOSE 98  BUN 27*  CREATININE 0.60*  CALCIUM 9.1    ------------------------------------------------------------------------------------------------------------------  Cardiac Enzymes  Recent Labs Lab 10/22/16 1116  TROPONINI 0.04*   ------------------------------------------------------------------------------------------------------------------  RADIOLOGY: Dg Chest 2 View  Result Date: 10/22/2016 CLINICAL DATA:  Recently diagnosed with aspiration pneumonia, congested cough. History of COPD and CHF and gastroesophageal reflux. Head and neck malignancy, diabetes. EXAM: CHEST  2 VIEW COMPARISON:  PA and lateral chest x-ray of January 12, 2016 and CT scan of the chest of the same day. FINDINGS: The right lung is adequately inflated and clear. On the left there is patchy increased density at the lung base consistent with subsegmental atelectasis. Confluent density in the medial  aspect of the left retrocardiac region in is present . There is a small left pleural effusion layering posteriorly. There is a trace of pleural fluid on the right. The cardiac silhouette is enlarged. The pulmonary vascularity is mildly prominent centrally. There are post CABG changes. There is calcification in the wall of the aortic arch. IMPRESSION: Probable small focus of left lower lobe atelectasis or pneumonia. Small left pleural effusion with trace right pleural effusion. Underlying COPD. Low-grade compensated CHF. Thoracic aortic atherosclerosis. Electronically Signed   By: David  Martinique M.D.   On: 10/22/2016 11:50    EKG: Orders placed or performed during the hospital encounter of 10/22/16  . ED EKG  . ED EKG  . EKG 12-Lead  . EKG 12-Lead   EKG in the emergency room revealed ectopic atrial tachycardia, unifocal. Rate of 111 bpm, inferior infarct with QS. He relates, ST depressions in lateral leads, likely LVH   IMPRESSION AND PLAN:  Active Problems:   Acute on chronic respiratory failure with hypoxia (HCC)   Community acquired pneumonia   Elevated troponin    Atrial tachycardia ([place])  #1. Acute on chronic respiratory failure with hypoxia, admit patient to medical floor, continue oxygen therapy, keeping pulse oximeter around 94% and above,  #2 pneumonia with history of Serratia and methicillin sensitive Staphylococcus aureus in the past, initiate patient on cefepime and vancomycin, check MRSA, PCR, get sputum culture, adjust antibiotics depending on culture results #3. Elevated troponin, intermittent chest pains, continue patient on beta blocker, aspirin, Lipitor, follow cardiac enzymes 3, possibly demand ischemia, get echocardiogram, cardiology consultation #4 atrial tachycardia, continue beta blockers, get TSH, echocardiogram, cardiologist input   All the records are reviewed and case discussed with ED provider. Management plans discussed with the patient, family and they are in agreement.  CODE STATUS: Code Status History    Date Active Date Inactive Code Status Order ID Comments User Context   01/04/2016 11:22 PM 01/11/2016  6:13 PM Full Code [id number]  Lance Coon, MD Inpatient   12/11/2015  5:09 PM 12/13/2015  4:53 PM Full Code [id number]  Fritzi Mandes, MD Inpatient   11/29/2015  9:13 PM 12/01/2015  1:58 PM Full Code [id number]  Gladstone Lighter, MD Inpatient   03/06/2015 10:40 AM 03/08/2015  2:22 PM Full Code [id number]  Adin Hector, MD Inpatient   02/01/2015  5:36 PM 02/08/2015  6:46 PM Full Code [id number]  Samella Parr, NP Inpatient   01/26/2015 10:05 PM 02/01/2015  5:36 PM Full Code [id number]  Demetrios Loll, MD Inpatient    Advance Directive Documentation     Most Recent Value  Type of Advance Directive  Out of facility DNR (pink MOST or yellow form)  Pre-existing out of facility DNR order (yellow form or pink MOST form)  -  "MOST" Form in Place?  -       TOTAL TIME TAKING CARE OF THIS PATIENT: 55  minutes.    Theodoro Grist M.D on 10/22/2016 at 3:21 PM  Between 7am to 6pm - Pager - [phone] After 6pm go to [email or web address] - password [email or web address]  [facility]  [facility]  Office  [phone]  CC: Primary care physician; Adin Hector, MD

## 2016-10-22 NOTE — Plan of Care (Signed)
Problem: Activity: Goal: Risk for activity intolerance will decrease Outcome: Not Progressing HR elevated.  Medications given.  Continue to monitor.

## 2016-10-22 NOTE — ED Provider Notes (Signed)
[facility] Provider Note    First MD Initiated Contact with Patient 10/22/16 1110     (approximate)  I have reviewed the triage vital signs and the nursing notes.   HISTORY  Chief Complaint Pneumonia    HPI John Mendoza is a 71 y.o. male presents from home after being checked by home health with concern for pneumonia. Patient was seen yesterday and started on antibiotics for concern for aspiration pneumonia. Patient with fever to 100.8 at home.  Ho COPD on 2L [place].  worsening SOB and productive cough.  Green and thick mucous.  No recent abx.  Had antibiotics called in by PCP was unable to afford them.  Has PEG tube.  NO NV.   Past Medical History:  Diagnosis Date  . Anxiety   . Arthritis   . Barrett esophagus   . Basal cell carcinoma   . CAD (coronary artery disease)    on 2l home oxygen  . CHF (congestive heart failure) ([place])   . Chronic lower back pain   . COPD (chronic obstructive pulmonary disease) ([place])   . Depression   . DJD (degenerative joint disease)   . DVT (deep venous thrombosis) ([place]) 11/2013; 02/01/2015   on coumadin  . GERD (gastroesophageal reflux disease)   . 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 Heart attack ([place]) 03/20/1995  . History of gout   . Hyperlipidemia   . Hypertension   . Hypothyroidism   . Iron deficiency anemia   . Lumbar spinal stenosis   . Multiple pulmonary nodules   . On home oxygen therapy   . Parkinson's disease ([place])   . Recurrent aspiration pneumonia ([place])   . Seizures ([place])   . Sleep apnea   . Throat cancer ([place])   . Type II diabetes mellitus (HCC)    Family History  Problem Relation Age of Onset  . Leukemia Paternal Grandmother   . Cancer Maternal Grandmother     stomach  . Cancer Paternal Aunt   . Other Father     bacterial endocarditis  . Rheum arthritis Father    Past Surgical History:  Procedure Laterality Date  .  ANTERIOR CERVICAL DECOMP/DISCECTOMY FUSION  X 2  . BACK SURGERY    . BASAL CELL CARCINOMA EXCISION    . CATARACT EXTRACTION W/ INTRAOCULAR LENS  IMPLANT, BILATERAL Bilateral   . CORONARY ANGIOPLASTY WITH STENT PLACEMENT    . CORONARY ARTERY BYPASS GRAFT  1996   "CABG X3"  . EXCISIONAL HEMORRHOIDECTOMY    . JOINT REPLACEMENT    . PEG PLACEMENT N/A 01/10/2016   Procedure: PERCUTANEOUS ENDOSCOPIC GASTROSTOMY (PEG) PLACEMENT;  Surgeon: Lucilla Lame, MD;  Location: [facility] ENDOSCOPY;  Service: Endoscopy;  Laterality: N/A;  . POSTERIOR FUSION CERVICAL SPINE  X 1  . RADICAL NECK DISSECTION Right ~ 1998   "throat cancer"  . THYROIDECTOMY  ~ 1996 X 2  . TONSILLECTOMY    . TOTAL KNEE ARTHROPLASTY Right 2011   Patient Active Problem List   Diagnosis Date Noted  . Hypothyroidism 01/04/2016  . Malnutrition of moderate degree 12/12/2015  . Aspiration pneumonia ([place]) 11/29/2015  . Dehydration 03/07/2015  . Orthostatic hypotension 03/06/2015  . Hypotension 03/06/2015  . Constipation 02/05/2015  . Dysphagia causing pulmonary aspiration with swallowing 02/03/2015  . Diskitis   . 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 Discitis of lumbar region 02/01/2015  . Epidural abscess 02/01/2015  .  Warfarin anticoagulation 02/01/2015  . Parkinsons disease ([place]) 02/01/2015  . Vertebral osteomyelitis ([place]) 02/01/2015  . Dysphagia/chronic 02/01/2015  . Bacteremia due to group B Streptococcus 01/28/2015  . Acute on chronic respiratory failure with hypoxia ([place] [address]) 01/26/2015  . Sepsis ([place]) 01/26/2015  . History of DVT (deep vein thrombosis) 02/20/2014  . 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 Multiple pulmonary nodules 01/15/2012  . Aspiration pneumonitis ([place]) 01/15/2012  . Hypertension 01/15/2012  . Barrett's esophagus 01/15/2012  . CAD (coronary artery disease) 01/15/2012  . COPD (chronic obstructive pulmonary disease) ([place]) 01/15/2012      Prior to  Admission medications   Medication Sig Start Date End Date Taking? Authorizing Provider  acetaminophen (TYLENOL) 500 MG tablet Take 500 mg by mouth every 6 (six) hours as needed.    Historical Provider, MD  ARIPiprazole (ABILIFY) 5 MG tablet Take 5 mg by mouth 2 (two) times daily.    Historical Provider, MD  Artificial Tear Ointment (DRY EYES OP) Place 1 drop into both eyes daily as needed (dry eyes).    Historical Provider, MD  aspirin EC 81 MG tablet Take 81 mg by mouth at bedtime.     Historical Provider, MD  atorvastatin (LIPITOR) 40 MG tablet Take 40 mg by mouth at bedtime.    Historical Provider, MD  bisacodyl (DULCOLAX) 10 MG suppository Place 1 suppository (10 mg total) rectally daily as needed for moderate constipation. 02/01/15   Tama High III, MD  bisoprolol (ZEBETA) 10 MG tablet Take 10 mg by mouth daily.    Historical Provider, MD  carbidopa-levodopa (SINEMET IR) 25-250 MG tablet Take 2.5 tablets by mouth 3 (three) times daily.     Historical Provider, MD  clonazePAM (KLONOPIN) 1 MG tablet Take 1 mg by mouth at bedtime. 09/20/14   Historical Provider, MD  fentaNYL (DURAGESIC - DOSED MCG/HR) 50 MCG/HR Place 1 patch (50 mcg total) onto the skin every 3 (three) days. 02/08/15   Orson Eva, MD  HYDROcodone-acetaminophen (NORCO/VICODIN) 5-325 MG tablet Take 0.5 tablets by mouth every 8 (eight) hours as needed for moderate pain.     Historical Provider, MD  lamoTRIgine (LAMICTAL) 25 MG tablet Take 25 mg by mouth 2 (two) times daily.    Historical Provider, MD  levothyroxine (SYNTHROID, LEVOTHROID) 137 MCG tablet Take 137 mcg by mouth daily.    Historical Provider, MD  losartan (COZAAR) 25 MG tablet Take 25 mg by mouth at bedtime. 01/04/16   Historical Provider, MD  metFORMIN (GLUCOPHAGE) 500 MG tablet Take 500 mg by mouth 2 (two) times daily. 12/12/14   Historical Provider, MD  nitroGLYCERIN (NITROSTAT) 0.4 MG SL tablet Place 0.4 mg under the tongue every 5 (five) minutes x 3 doses as needed for  chest pain.     Historical Provider, MD  omeprazole (PRILOSEC) 20 MG capsule Take 20 mg by mouth 2 (two) times daily before a meal.     Historical Provider, MD  PARoxetine (PAXIL) 20 MG tablet Take 60 mg by mouth daily.  07/30/10   Historical Provider, MD  Respiratory Therapy Supplies (FLUTTER) DEVI USE AS DIRECTED 02/21/16   Laverle Hobby, MD  sulfamethoxazole-trimethoprim (BACTRIM DS) 800-160 MG tablet Take 1 tablet by mouth 2 (two) times daily. 01/12/16   Carrie Mew, MD  sulfamethoxazole-trimethoprim (BACTRIM DS,SEPTRA DS) 800-160 MG tablet Take 1 tablet by mouth 2 (two) times daily. X 14 days 02/27/16   Laverle Hobby, MD  tamsulosin (FLOMAX) 0.4 MG CAPS Take 0.4 mg by mouth daily.  Historical Provider, MD  tiZANidine (ZANAFLEX) 4 MG tablet Take 4 mg by mouth every 8 (eight) hours as needed for muscle spasms.    Historical Provider, MD  triamcinolone cream (KENALOG) 0.5 % Apply 1 application topically 2 (two) times daily as needed (rash).  11/07/15   Historical Provider, MD  warfarin (COUMADIN) 5 MG tablet Take 1 tablet (5 mg total) by mouth daily. 03/08/15   Adin Hector, MD    Allergies Other and Sulfa antibiotics    Social History Social History  Substance Use Topics  . Smoking status: Former Smoker    Packs/day: 2.00    Years: 35.00    Types: Cigarettes    Quit date: 03/20/1995  . Smokeless tobacco: Never Used  . Alcohol use No     Comment: 02/01/2015 "stopped drinking in ~ 1996; never had problem w/it"    Review of Systems Patient denies headaches, rhinorrhea, blurry vision, numbness, shortness of breath, chest pain, edema, cough, abdominal pain, nausea, vomiting, diarrhea, dysuria, fevers, rashes or hallucinations unless otherwise stated above in HPI. ____________________________________________   PHYSICAL EXAM:  VITAL SIGNS: Vitals:   10/22/16 1330 10/22/16 1400  BP: (!) 149/94 (!) 153/97  Pulse: (!) 104 (!) 110  Resp: (!) 26 18  Temp:       Constitutional: Alert and oriented. Ill appearing in moderate respiratory distress Eyes: Conjunctivae are normal. PERRL. EOMI. Head: Atraumatic. Nose: No congestion/rhinnorhea. Mouth/Throat: Mucous membranes are moist.  Oropharynx non-erythematous. Neck: No stridor. Painless ROM. No cervical spine tenderness to palpation Hematological/Lymphatic/Immunilogical: No cervical lymphadenopathy. Cardiovascular: tachycardic, regular rhythm. Grossly normal heart sounds.  Good peripheral circulation. Respiratory: tachypnic, use of accessory muscles, bibasilar rhonchi Gastrointestinal: Soft and nontender. No distention. No abdominal bruits. No CVA tenderness. Musculoskeletal: No lower extremity tenderness nor edema.  No joint effusions. Neurologic:  Chronic right sided tremors Skin:  Skin is warm, dry and intact. No rash noted. Psychiatric: Mood and affect are normal. Speech and behavior are normal.  ____________________________________________   LABS (all labs ordered are listed, but only abnormal results are displayed)  Results for orders placed or performed during the hospital encounter of 10/22/16 (from the past 24 hour(s))  CBC with Differential/Platelet     Status: Abnormal   Collection Time: 10/22/16 11:16 AM  Result Value Ref Range   WBC 4.7 3.8 - 10.6 K/uL   RBC 4.02 (L) 4.40 - 5.90 MIL/uL   Hemoglobin 11.0 (L) 13.0 - 18.0 g/dL   HCT 33.5 (L) 40.0 - 52.0 %   MCV 83.3 80.0 - 100.0 fL   MCH 27.3 26.0 - 34.0 pg   MCHC 32.8 32.0 - 36.0 g/dL   RDW 17.1 (H) 11.5 - 14.5 %   Platelets 148 (L) 150 - 440 K/uL   Neutrophils Relative % 79 %   Lymphocytes Relative 8 %   Monocytes Relative 13 %   Eosinophils Relative 0 %   Basophils Relative 0 %   Neutro Abs 3.7 1.4 - 6.5 K/uL   Lymphs Abs 0.4 (L) 1.0 - 3.6 K/uL   Monocytes Absolute 0.6 0.2 - 1.0 K/uL   Eosinophils Absolute 0.0 0 - 0.7 K/uL   Basophils Absolute 0.0 0 - 0.1 K/uL   RBC Morphology MIXED RBC POPULATION   Basic metabolic  panel     Status: Abnormal   Collection Time: 10/22/16 11:16 AM  Result Value Ref Range   Sodium 135 135 - 145 mmol/L   Potassium 4.2 3.5 - 5.1 mmol/L   Chloride 97 (  L) 101 - 111 mmol/L   CO2 29 22 - 32 mmol/L   Glucose, Bld 98 65 - 99 mg/dL   BUN 27 (H) 6 - 20 mg/dL   Creatinine, Ser 0.60 (L) 0.61 - 1.24 mg/dL   Calcium 9.1 8.9 - 10.3 mg/dL   GFR calc non Af Amer >60 >60 mL/min   GFR calc Af Amer >60 >60 mL/min   Anion gap 9 5 - 15  Brain natriuretic peptide     Status: Abnormal   Collection Time: 10/22/16 11:16 AM  Result Value Ref Range   B Natriuretic Peptide 601.0 (H) 0.0 - 100.0 pg/mL  Troponin I     Status: Abnormal   Collection Time: 10/22/16 11:16 AM  Result Value Ref Range   Troponin I 0.04 (HH) <0.03 ng/mL   ____________________________________________  EKG My review and personal interpretation at Time: 11:24 Indication: sob  Rate: 110  Rhythm: sinus Axis: normal Other: non specific st changes, normal intervals ____________________________________________  RADIOLOGY  I personally reviewed all radiographic images ordered to evaluate for the above acute complaints and reviewed radiology reports and findings.  These findings were personally discussed with the patient.  Please see medical record for radiology report.  ____________________________________________   PROCEDURES  Procedure(s) performed:  Procedures    Critical Care performed: yes CRITICAL CARE Performed by: Merlyn Lot   Total critical care time: 40 minutes  Critical care time was exclusive of separately billable procedures and treating other patients.  Critical care was necessary to treat or prevent imminent or life-threatening deterioration.  Critical care was time spent personally by me on the following activities: development of treatment plan with patient and/or surrogate as well as nursing, discussions with consultants, evaluation of patient's response to treatment, examination of  patient, obtaining history from patient or surrogate, ordering and performing treatments and interventions, ordering and review of laboratory studies, ordering and review of radiographic studies, pulse oximetry and re-evaluation of patient's condition.  ____________________________________________   INITIAL IMPRESSION / ASSESSMENT AND PLAN / ED COURSE  Pertinent labs & imaging results that were available during my care of the patient were reviewed by me and considered in my medical decision making (see chart for details).  DDX: Asthma, copd, CHF, pna, ptx, malignancy, Pe, anemia   WATSON ROBARGE is a 71 y.o. who presents to the ED with sob and cough.  Patient afebrile but tachycardic and with marked tachypnea.  Complex presentation given chronic co-morbidities.  Less consistent with congestive heart failure. Possible component of underlying COPD therefore multiple nebulizer treatments given. Chest x-ray with evidence of infiltrate and given his productive cough with thick green sputum and rhonchorous breath sounds will give antibiotics for presumed pneumonia. No significant leukocytosis. Will check for flow. Based on his frailty, tachycardia and underlying lung disease do feel patient will benefit from IV antibiotics and admission to the hospital.  Have discussed with the patient and available family all diagnostics and treatments performed thus far and all questions were answered to the best of my ability. The patient demonstrates understanding and agreement with plan.       ____________________________________________   FINAL CLINICAL IMPRESSION(S) / ED DIAGNOSES  Final diagnoses:  Pneumonia of left lower lobe due to infectious organism ([place])  Sepsis, due to unspecified organism [facility])      NEW MEDICATIONS STARTED DURING THIS VISIT:  New Prescriptions   No medications on file     Note:  This document was prepared using Dragon voice recognition software and  may include  unintentional dictation errors.    Merlyn Lot, MD 10/22/16 1447

## 2016-10-23 DIAGNOSIS — J181 Lobar pneumonia, unspecified organism: Secondary | ICD-10-CM

## 2016-10-23 DIAGNOSIS — J9621 Acute and chronic respiratory failure with hypoxia: Secondary | ICD-10-CM

## 2016-10-23 LAB — GLUCOSE, CAPILLARY
GLUCOSE-CAPILLARY: 69 mg/dL (ref 65–99)
GLUCOSE-CAPILLARY: 72 mg/dL (ref 65–99)
Glucose-Capillary: 142 mg/dL — ABNORMAL HIGH (ref 65–99)
Glucose-Capillary: 177 mg/dL — ABNORMAL HIGH (ref 65–99)
Glucose-Capillary: 92 mg/dL (ref 65–99)

## 2016-10-23 LAB — BASIC METABOLIC PANEL
Anion gap: 8 (ref 5–15)
BUN: 23 mg/dL — AB (ref 6–20)
CHLORIDE: 95 mmol/L — AB (ref 101–111)
CO2: 32 mmol/L (ref 22–32)
CREATININE: 0.58 mg/dL — AB (ref 0.61–1.24)
Calcium: 8.8 mg/dL — ABNORMAL LOW (ref 8.9–10.3)
GFR calc Af Amer: >60 mL/min (ref >60)
Glucose, Bld: 97 mg/dL (ref 65–99)
Potassium: 4.1 mmol/L (ref 3.5–5.1)
SODIUM: 135 mmol/L (ref 135–145)

## 2016-10-23 LAB — CBC
HCT: 35.1 % — ABNORMAL LOW (ref 40.0–52.0)
Hemoglobin: 11.4 g/dL — ABNORMAL LOW (ref 13.0–18.0)
MCH: 26.8 pg (ref 26.0–34.0)
MCHC: 32.4 g/dL (ref 32.0–36.0)
MCV: 82.7 fL (ref 80.0–100.0)
PLATELETS: 150 10*3/uL (ref 150–440)
RBC: 4.25 MIL/uL — ABNORMAL LOW (ref 4.40–5.90)
RDW: 17.1 % — AB (ref 11.5–14.5)
WBC: 4 10*3/uL (ref 3.8–10.6)

## 2016-10-23 LAB — HEMOGLOBIN A1C
HEMOGLOBIN A1C: 5.9 % — AB (ref 4.8–5.6)
MEAN PLASMA GLUCOSE: 123 mg/dL

## 2016-10-23 LAB — TROPONIN I
TROPONIN I: 0.04 ng/mL — AB (ref <0.03)
TROPONIN I: 0.04 ng/mL — AB (ref <0.03)

## 2016-10-23 LAB — ECHOCARDIOGRAM COMPLETE
Height: 69 in
Weight: 2080 oz

## 2016-10-23 LAB — PROTIME-INR
INR: 1.43
Prothrombin Time: 17.6 seconds — ABNORMAL HIGH (ref 11.4–15.2)

## 2016-10-23 MED ORDER — ARIPIPRAZOLE 10 MG PO TABS
5.0000 mg | ORAL_TABLET | Freq: Two times a day (BID) | ORAL | Status: DC
  Administered 2016-10-23 – 2016-10-27 (×8): 5 mg
  Filled 2016-10-23 (×9): qty 1

## 2016-10-23 MED ORDER — CLONAZEPAM 1 MG PO TABS
1.5000 mg | ORAL_TABLET | Freq: Every day | ORAL | Status: DC
  Administered 2016-10-23 – 2016-10-26 (×4): 1.5 mg
  Filled 2016-10-23 (×4): qty 3

## 2016-10-23 MED ORDER — WARFARIN SODIUM 2 MG PO TABS: 6.0000 mg | ORAL_TABLET | Freq: Every evening | ORAL | Status: DC

## 2016-10-23 MED ORDER — BISOPROLOL FUMARATE 10 MG PO TABS: 10.0000 mg | ORAL_TABLET | Freq: Every day | ORAL | Status: DC

## 2016-10-23 MED ORDER — ENSURE ENLIVE PO LIQD
237.0000 mL | Freq: Every day | ORAL | Status: DC
  Administered 2016-10-23 – 2016-10-27 (×21): 237 mL

## 2016-10-23 MED ORDER — SODIUM CHLORIDE 0.9 % IV BOLUS (SEPSIS)
500.0000 mL | Freq: Once | INTRAVENOUS | Status: AC
  Administered 2016-10-23: 500 mL via INTRAVENOUS

## 2016-10-23 MED ORDER — CHLORHEXIDINE GLUCONATE 0.12 % MT SOLN
15.0000 mL | Freq: Two times a day (BID) | OROMUCOSAL | Status: DC
  Administered 2016-10-23 – 2016-10-27 (×8): 15 mL via OROMUCOSAL
  Filled 2016-10-23 (×4): qty 15

## 2016-10-23 MED ORDER — LAMOTRIGINE 25 MG PO TABS
25.0000 mg | ORAL_TABLET | Freq: Two times a day (BID) | ORAL | Status: DC
  Administered 2016-10-23 – 2016-10-27 (×8): 25 mg
  Filled 2016-10-23 (×8): qty 1

## 2016-10-23 MED ORDER — METOPROLOL TARTRATE 5 MG/5ML IV SOLN
2.5000 mg | INTRAVENOUS | Status: DC | PRN
  Administered 2016-10-23: 5 mg via INTRAVENOUS
  Filled 2016-10-23: qty 5

## 2016-10-23 MED ORDER — LEVOTHYROXINE SODIUM 137 MCG PO TABS
137.0000 ug | ORAL_TABLET | Freq: Every day | ORAL | Status: DC
  Administered 2016-10-24 – 2016-10-27 (×4): 137 ug
  Filled 2016-10-23 (×4): qty 1

## 2016-10-23 MED ORDER — METOPROLOL TARTRATE 5 MG/5ML IV SOLN
2.5000 mg | INTRAVENOUS | Status: DC | PRN
  Administered 2016-10-23 – 2016-10-24 (×5): 5 mg via INTRAVENOUS
  Filled 2016-10-23 (×5): qty 5

## 2016-10-23 MED ORDER — PAROXETINE HCL 30 MG PO TABS
60.0000 mg | ORAL_TABLET | Freq: Every day | ORAL | Status: DC
  Administered 2016-10-24 – 2016-10-27 (×4): 60 mg
  Filled 2016-10-23: qty 2
  Filled 2016-10-23: qty 3
  Filled 2016-10-23: qty 2
  Filled 2016-10-23: qty 3

## 2016-10-23 MED ORDER — ATORVASTATIN CALCIUM 20 MG PO TABS
40.0000 mg | ORAL_TABLET | Freq: Every day | ORAL | Status: DC
  Administered 2016-10-23 – 2016-10-26 (×4): 40 mg
  Filled 2016-10-23 (×4): qty 2

## 2016-10-23 MED ORDER — AMIODARONE HCL IN DEXTROSE 360-4.14 MG/200ML-% IV SOLN
60.0000 mg/h | INTRAVENOUS | Status: DC
  Administered 2016-10-23 (×2): 60 mg/h via INTRAVENOUS
  Filled 2016-10-23: qty 200

## 2016-10-23 MED ORDER — ASPIRIN 81 MG PO CHEW
81.0000 mg | CHEWABLE_TABLET | Freq: Every day | ORAL | Status: DC
  Administered 2016-10-23 – 2016-10-26 (×4): 81 mg
  Filled 2016-10-23 (×4): qty 1

## 2016-10-23 MED ORDER — ORAL CARE MOUTH RINSE
15.0000 mL | Freq: Two times a day (BID) | OROMUCOSAL | Status: DC
  Administered 2016-10-23 – 2016-10-26 (×7): 15 mL via OROMUCOSAL

## 2016-10-23 MED ORDER — VANCOMYCIN HCL IN DEXTROSE 750-5 MG/150ML-% IV SOLN
750.0000 mg | Freq: Two times a day (BID) | INTRAVENOUS | Status: DC
  Filled 2016-10-23: qty 150

## 2016-10-23 MED ORDER — DIGOXIN 0.25 MG/ML IJ SOLN
0.2500 mg | Freq: Every day | INTRAMUSCULAR | Status: AC
  Administered 2016-10-23: 0.25 mg via INTRAVENOUS
  Filled 2016-10-23: qty 2

## 2016-10-23 MED ORDER — CARBIDOPA-LEVODOPA 25-250 MG PO TABS
2.0000 | ORAL_TABLET | Freq: Three times a day (TID) | ORAL | Status: DC
  Administered 2016-10-23 – 2016-10-27 (×12): 2
  Filled 2016-10-23 (×12): qty 2

## 2016-10-23 MED ORDER — AMIODARONE HCL IN DEXTROSE 360-4.14 MG/200ML-% IV SOLN
30.0000 mg/h | INTRAVENOUS | Status: DC
  Administered 2016-10-24 – 2016-10-25 (×3): 30 mg/h via INTRAVENOUS
  Filled 2016-10-23 (×2): qty 200

## 2016-10-23 MED ORDER — AMIODARONE LOAD VIA INFUSION
150.0000 mg | Freq: Once | INTRAVENOUS | Status: AC
  Administered 2016-10-23: 150 mg via INTRAVENOUS

## 2016-10-23 MED ORDER — DEXTROSE 5 % IV SOLN
750.0000 mg | Freq: Two times a day (BID) | INTRAVENOUS | Status: DC
  Administered 2016-10-24 (×2): 750 mg via INTRAVENOUS
  Filled 2016-10-23 (×3): qty 750

## 2016-10-23 MED ORDER — HYDROCODONE-ACETAMINOPHEN 5-325 MG PO TABS
1.0000 | ORAL_TABLET | Freq: Every day | ORAL | Status: DC
  Administered 2016-10-23 – 2016-10-26 (×4): 1
  Filled 2016-10-23 (×4): qty 1

## 2016-10-23 MED ORDER — WARFARIN SODIUM 2 MG PO TABS
3.0000 mg | ORAL_TABLET | Freq: Every evening | ORAL | Status: DC
  Administered 2016-10-24: 3 mg
  Filled 2016-10-23: qty 2

## 2016-10-23 NOTE — Progress Notes (Signed)
Notified physician of desaturation on non rebreather and low blood pressure. Systolic blood pressure is int he 80's. Cardizem drip was stopped at 2310. Patient is diaphoretic, blood glucose level is 144.  Patient has no complaints of pain. Patient is communicating with staff appropriately. ABG and Chest x-ray ordered. Xray has worsened since previous study. Patient is hypoxic and requires bipap. Administered a 250cc normal saline bolus blood pressure improved to 89/51. Dr. Jannifer Franklin placed orders to have patient transferred to step down.

## 2016-10-23 NOTE — Progress Notes (Signed)
[place] for Warfarin Indication: h/o DVT  Allergies  Allergen Reactions  . Other Other (See Comments)    Dizziness, lightheadedness, Pt states that inhaled medications make him depressed and have negative thoughts.    . Sulfa Antibiotics Rash    Headache    Patient Measurements: Height: 5\' 9"  (175.3 cm) Weight: 130 lb (59 kg) IBW/kg (Calculated) : 70.7  Vital Signs: Temp: 100.7 F (38.2 C) (04/19 1400) Temp Source: Axillary (04/19 1400) BP: 124/94 (04/19 1700) Pulse Rate: 208 (04/19 1700)  Labs:  Recent Labs  10/22/16 1116 10/22/16 1758 10/22/16 2336 10/23/16 0336  HGB 11.0*  --   --  11.4*  HCT 33.5*  --   --  35.1*  PLT 148*  --   --  150  LABPROT  --  17.9*  --  17.6*  INR  --  1.46  --  1.43  CREATININE 0.60*  --   --  0.58*  TROPONINI 0.04* 0.03* 0.04* 0.04*    Estimated Creatinine Clearance: 70.7 mL/min (A) (by C-G formula based on SCr of 0.58 mg/dL (L)).   Medical History: Past Medical History:  Diagnosis Date  . Anxiety   . Arthritis   . Barrett esophagus   . Basal cell carcinoma   . CAD (coronary artery disease)    on 2l home oxygen  . CHF (congestive heart failure) ([place])   . Chronic lower back pain   . COPD (chronic obstructive pulmonary disease) ([place])   . Depression   . DJD (degenerative joint disease)   . DVT (deep venous thrombosis) ([place]) 11/2013; 02/01/2015   on coumadin  . GERD (gastroesophageal reflux disease)   . 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 Heart attack ([place]) 03/20/1995  . History of gout   . Hyperlipidemia   . Hypertension   . Hypothyroidism   . Iron deficiency anemia   . Lumbar spinal stenosis   . Multiple pulmonary nodules   . On home oxygen therapy   . Parkinson's disease ([place])   . Recurrent aspiration pneumonia ([place])   . Seizures ([place])   . Sleep apnea   . Throat cancer ([place])   . Type II diabetes mellitus [facility])     Assessment: 71 y/o F with a h/o DVT on warfarin on antibiotics for pneumonia. Patient has orders to begin amiodarone for atrial fibrillation.   Goal of Therapy:  INR 2-3   Plan:  Will empirically reduce warfarin dose to 3 mg daily due to significant drug interactions. Will f/u AM INR.   [place], Gumbs D 10/23/2016,6:10 PM

## 2016-10-23 NOTE — Progress Notes (Signed)
[facility] Progress Note Patient Name: John Mendoza DOB: [birth year] MRN: [id number]   Date of Service  10/23/2016  HPI/Events of Note  bc returned x one for gbc in clusters   eICU Interventions  Resume vanc IV per pharmacy until id and sensitivities back     Intervention Category Major Interventions: Sepsis - evaluation and management  Christinia Gully 10/23/2016, 3:21 PM

## 2016-10-23 NOTE — Progress Notes (Signed)
[facility]  Patient Status: Active  [facility] is providing the following services: SN  If patient discharges after hours, please call [phone].   John Mendoza 10/23/2016, 8:56 AM

## 2016-10-23 NOTE — Progress Notes (Signed)
Initial Nutrition Assessment  DOCUMENTATION CODES:   Severe malnutrition in context of chronic illness  INTERVENTION:  -Will continue Ensure supplement as enteral nutrition per pt request. Recommend Ensure Enlive 237 mL 5 times daily providing 1750 kcals, 100 g of protein and 900 mL of free water. Continue to assess -If pt tolerating TF, recommend increasing to 6 cans per day to better meet nutritional needs due to wt loss, muscle wasting. Current TF at home does not meet nutritional needs, in particular protein needs. Will need to discuss with pt and wife. Continue to assess.   NUTRITION DIAGNOSIS:   Malnutrition related to chronic illness as evidenced by severe depletion of body fat, severe depletion of muscle mass, percent weight loss.   GOAL:   Patient will meet greater than or equal to 90% of their needs  MONITOR:   TF tolerance, Labs, Weight trends  REASON FOR ASSESSMENT:   Consult Enteral/tube feeding initiation and management  ASSESSMENT:   71 yo male admitted with acute respiratory failure with worsening pneumonia, CHF exacerbation. Pt with hx of Parkinson's Disease, throat cancer, CHF, COPD, dysphagia s/p PEG placement  Pt currently on BiPap, alert/oriented but weak, SOB  Pt utilizes EnsurePlus 5 times daily via PEG tube (1750 kcals, 65 g of protein). Pt reports no issues with tolerance.  Pt acknowledges wt loss but does no know how much. Per chart review, 18.8% wt loss in 8 months. >20% wt loss in 1 year (significant for time frame)   Wt Readings from Last 10 Encounters:  10/22/16 130 lb (59 kg)  02/21/16 160 lb (72.6 kg)  01/12/16 162 lb (73.5 kg)  01/06/16 162 lb (73.5 kg)  12/11/15 159 lb (72.1 kg)  11/29/15 167 lb (75.8 kg)  10/29/15 170 lb (77.1 kg)  02/08/15 183 lb 10.3 oz (83.3 kg)  01/28/15 200 lb 1.6 oz (90.8 kg)  07/12/14 191 lb (86.6 kg)   Nutrition-Focused physical exam completed. Findings are moderate to severe fat depletion, moderate to  severe muscle depletion, and no edema. Pt with depletions in all areas.   Labs: CBGs wdl, HgbA1c 5.9 Meds: ss novolog, metformin  Diet Order:  Diet NPO time specified  Skin:  Reviewed, no issues  Last BM:  no documented BM  Height:   Ht Readings from Last 1 Encounters:  10/22/16 5\' 9"  (1.753 m)    Weight:   Wt Readings from Last 1 Encounters:  10/22/16 130 lb (59 kg)    BMI:  Body mass index is 19.2 kg/m.  Estimated Nutritional Needs:   Kcal:  8469-6295 kcals  Protein:  89-103 g  Fluid:  >/- 1.5 L  EDUCATION NEEDS:   No education needs identified at this time  Kerman Passey [place], [place], [place] [phone] Pager  [phone] Weekend/On-Call Pager

## 2016-10-23 NOTE — Progress Notes (Addendum)
Patient ID: John Mendoza, male   DOB: 1945/09/05, 71 y.o.   MRN: [id number]  [facility] PROGRESS NOTE  John Mendoza YQM:[id number] DOB: 09-Jul-1945 DOA: 10/22/2016 PCP: Adin Hector, MD  HPI/Subjective: Patient short of breath cough and wheezing. Feeling weak. Resting tremor with Parkinson's  Objective: Vitals:   10/23/16 1300 10/23/16 1400  BP: (!) 134/91 (!) 141/86  Pulse: (!) 120 (!) 112  Resp: (!) 22 (!) 26  Temp:  (!) 100.7 F (38.2 C)    Filed Weights   10/22/16 1118  Weight: 59 kg (130 lb)    ROS: Review of Systems  Constitutional: Negative for chills and fever.  Eyes: Negative for blurred vision.  Respiratory: Positive for cough, shortness of breath and wheezing.   Cardiovascular: Negative for chest pain.  Gastrointestinal: Negative for abdominal pain, constipation, diarrhea, nausea and vomiting.  Genitourinary: Negative for dysuria.  Musculoskeletal: Negative for joint pain.  Neurological: Negative for dizziness and headaches.   Exam: Physical Exam  Constitutional: He is oriented to person, place, and time.  HENT:  Nose: No mucosal edema.  Mouth/Throat: No oropharyngeal exudate or posterior oropharyngeal edema.  Eyes: Conjunctivae, EOM and lids are normal. Pupils are equal, round, and reactive to light.  Neck: No JVD present. Carotid bruit is not present. No edema present. No thyroid mass and no thyromegaly present.  Cardiovascular: S1 normal, S2 normal and normal heart sounds.  Tachycardia present.  Exam reveals no gallop.   No murmur heard. Pulses:      Dorsalis pedis pulses are 2+ on the right side, and 2+ on the left side.  Respiratory: No respiratory distress. He has decreased breath sounds in the right middle field, the right lower field, the left middle field and the left lower field. He has wheezes in the right middle field, the right lower field and the left lower field. He has no rhonchi. He has no rales.  GI: Soft. Bowel sounds are normal.  There is no tenderness.  Musculoskeletal:       Right ankle: He exhibits no swelling.       Left ankle: He exhibits no swelling.  Lymphadenopathy:    He has no cervical adenopathy.  Neurological: He is alert and oriented to person, place, and time. No cranial nerve deficit.  Skin: Skin is warm. No rash noted. Nails show no clubbing.  Psychiatric: He has a normal mood and affect.      Data Reviewed: Basic Metabolic Panel:  Recent Labs Lab 10/22/16 1116 10/23/16 0336  NA 135 135  K 4.2 4.1  CL 97* 95*  CO2 29 32  GLUCOSE 98 97  BUN 27* 23*  CREATININE 0.60* 0.58*  CALCIUM 9.1 8.8*   CBC:  Recent Labs Lab 10/22/16 1116 10/23/16 0336  WBC 4.7 4.0  NEUTROABS 3.7  --   HGB 11.0* 11.4*  HCT 33.5* 35.1*  MCV 83.3 82.7  PLT 148* 150   Cardiac Enzymes:  Recent Labs Lab 10/22/16 1116 10/22/16 1758 10/22/16 2336 10/23/16 0336  TROPONINI 0.04* 0.03* 0.04* 0.04*   BNP (last 3 results)  Recent Labs  10/22/16 1116  BNP 601.0*     CBG:  Recent Labs Lab 10/22/16 2129 10/22/16 2312 10/23/16 0046 10/23/16 0724 10/23/16 1118  GLUCAP 155* 144* 92 69 72    Recent Results (from the past 240 hour(s))  Blood culture (routine x 2)     Status: None (Preliminary result)   Collection Time: 10/22/16  2:02 PM  Result  Value Ref Range Status   Specimen Description BLOOD R FA  Final   Special Requests   Final    BOTTLES DRAWN AEROBIC AND ANAEROBIC Blood Culture adequate volume   Culture NO GROWTH < 24 HOURS  Final   Report Status PENDING  Incomplete  Blood culture (routine x 2)     Status: None (Preliminary result)   Collection Time: 10/22/16  2:02 PM  Result Value Ref Range Status   Specimen Description BLOOD L FA  Final   Special Requests   Final    BOTTLES DRAWN AEROBIC AND ANAEROBIC Blood Culture adequate volume   Culture NO GROWTH < 24 HOURS  Final   Report Status PENDING  Incomplete  Culture, expectorated sputum-assessment     Status: None   Collection  Time: 10/22/16  3:06 PM  Result Value Ref Range Status   Specimen Description SPU  Final   Special Requests NONE  Final   Sputum evaluation THIS SPECIMEN IS ACCEPTABLE FOR SPUTUM CULTURE  Final   Report Status 10/22/2016 FINAL  Final  Culture, respiratory (NON-Expectorated)     Status: None (Preliminary result)   Collection Time: 10/22/16  3:06 PM  Result Value Ref Range Status   Specimen Description SPU  Final   Special Requests NONE Reflexed from [id number]  Final   Gram Stain   Final    ABUNDANT WBC PRESENT,BOTH PMN AND MONONUCLEAR ABUNDANT IN CLUSTERS IN PAIRS GRAM POSITIVE COCCI    Culture   Final    TOO YOUNG TO READ Performed at [facility], [place] [address] [place], [place] [address]    Report Status PENDING  Incomplete  MRSA PCR Screening     Status: None   Collection Time: 10/22/16  5:55 PM  Result Value Ref Range Status   MRSA by PCR NEGATIVE NEGATIVE Final    Comment:        The GeneXpert MRSA Assay (FDA approved for NASAL specimens only), is one component of a comprehensive MRSA colonization surveillance program. It is not intended to diagnose MRSA infection nor to guide or monitor treatment for MRSA infections.      Studies: Dg Chest 2 View  Result Date: 10/22/2016 CLINICAL DATA:  Recently diagnosed with aspiration pneumonia, congested cough. History of COPD and CHF and gastroesophageal reflux. Head and neck malignancy, diabetes. EXAM: CHEST  2 VIEW COMPARISON:  PA and lateral chest x-ray of January 12, 2016 and CT scan of the chest of the same day. FINDINGS: The right lung is adequately inflated and clear. On the left there is patchy increased density at the lung base consistent with subsegmental atelectasis. Confluent density in the medial aspect of the left retrocardiac region in is present . There is a small left pleural effusion layering posteriorly. There is a trace of pleural fluid on the right. The cardiac silhouette is enlarged. The pulmonary vascularity  is mildly prominent centrally. There are post CABG changes. There is calcification in the wall of the aortic arch. IMPRESSION: Probable small focus of left lower lobe atelectasis or pneumonia. Small left pleural effusion with trace right pleural effusion. Underlying COPD. Low-grade compensated CHF. Thoracic aortic atherosclerosis. Electronically Signed   By: David  Martinique M.D.   On: 10/22/2016 11:50   Dg Chest Port 1 View  Result Date: 10/22/2016 CLINICAL DATA:  Worsening cough and low oxygen saturation. EXAM: PORTABLE CHEST 1 VIEW COMPARISON:  10/22/2016 FINDINGS: Postoperative changes in the mediastinum. Cardiac enlargement without vascular congestion. Since the previous study, there  is interval development of dense consolidation in the right middle lung and left lingula consistent with developing atelectasis, aspiration, or pneumonia. Bronchial obstruction due to mucous plugging or inhaled material is not excluded. No blunting of costophrenic angles. No pneumothorax. Calcified aorta. Degenerative changes in the spine. IMPRESSION: New development of dense consolidation in the right middle lung and lingula likely represent pneumonia or atelectasis. Bronchial obstruction is not excluded. Electronically Signed   By: Lucienne Capers M.D.   On: 10/22/2016 23:52    Scheduled Meds: . ARIPiprazole  5 mg Per Tube BID  . aspirin  81 mg Per Tube QHS  . atorvastatin  40 mg Per Tube QHS  . [START ON 10/24/2016] bisoprolol  10 mg Per Tube Daily  . carbidopa-levodopa  2 tablet Per Tube TID  . chlorhexidine  15 mL Mouth Rinse BID  . chlorpheniramine-HYDROcodone  5 mL Per Tube Q12H  . clonazePAM  1.5 mg Per Tube QHS  . feeding supplement (ENSURE ENLIVE)  237 mL Per Tube 5 X Daily  . HYDROcodone-acetaminophen  1 tablet Per Tube QHS  . insulin aspart  0-5 Units Subcutaneous QHS  . insulin aspart  0-9 Units Subcutaneous TID WC  . ipratropium-albuterol  3 mL Nebulization Q4H  . lamoTRIgine  25 mg Per Tube BID  .  [START ON 10/24/2016] levothyroxine  137 mcg Per Tube Daily  . mouth rinse  15 mL Mouth Rinse q12n4p  . [START ON 10/24/2016] PARoxetine  60 mg Per Tube Daily  . sodium chloride flush  3 mL Intravenous Q12H  . sodium chloride flush  3 mL Intravenous Q12H  . warfarin  6 mg Per Tube QPM  . Warfarin - Physician Dosing Inpatient   Does not apply q1800   Continuous Infusions: . sodium chloride 250 mL (10/23/16 0700)  . sodium chloride 999 mL/hr at 10/22/16 2338  . ceFEPime (MAXIPIME) IV 1 g (10/23/16 1410)    Assessment/Plan:  1. Acute on chronic respiratory failure with hypoxia. Hx Copd. Patient on continuous BiPAP. Case discussed with critical care specialist that the oxygen requirements have come down. Still at high risk at this point. 2. Pneumonia right middle lobe with history of aspiration pneumonia. MRSA PCR is negative case discussed with critical care specialist to discontinue vancomycin. Patient on Maxipime and Zithromax at this time. 3. Atrial tachycardia, borderline troponin.  Demand ischemia.  On bisoprolol. 4. Parkinson's disease with resting tremor on Sinemet 5. Type 2 diabetes mellitus on sliding scale only 6. History of DVT on Coumadin 7. Chronic systolic congestive heart failure with an EF of 20-25%.  Consider Lasix if still wHe is tomorrow doing wheezing tomorrow and still short of breath 8. Hyperlipidemia unspecified on Lipitor  9. Anxiety on clonazepam, abilify  10.  hypothyroidism unspecified on levothyroxine 11. Severe malnutrition.  Code Status:     Code Status Orders        Start     Ordered   10/22/16 1740  Full code  Continuous     10/22/16 1739    Code Status History    Date Active Date Inactive Code Status Order ID Comments User Context   01/04/2016 11:22 PM 01/11/2016  6:13 PM Full Code [id number]  Lance Coon, MD Inpatient   12/11/2015  5:09 PM 12/13/2015  4:53 PM Full Code [id number]  Fritzi Mandes, MD Inpatient   11/29/2015  9:13 PM 12/01/2015  1:58 PM Full  Code [id number]  Gladstone Lighter, MD Inpatient   03/06/2015 10:40 AM 03/08/2015  2:22  PM Full Code [id number]  Adin Hector, MD Inpatient   02/01/2015  5:36 PM 02/08/2015  6:46 PM Full Code [id number]  Samella Parr, NP Inpatient   01/26/2015 10:05 PM 02/01/2015  5:36 PM Full Code [id number]  Demetrios Loll, MD Inpatient    Advance Directive Documentation     Most Recent Value  Type of Advance Directive  Out of facility DNR (pink MOST or yellow form)  Pre-existing out of facility DNR order (yellow form or pink MOST form)  -  "MOST" Form in Place?  -     Family Communication: as per critical care specialist Disposition Plan: TBD  Consultants: - critical care specialist - cardiology  Antibiotics:  Cefepime  zithromax  Time spent: 25 minutes  [place], [place]

## 2016-10-23 NOTE — Progress Notes (Signed)
D/w pharmacy, Dr. Earleen Newport, MRSA screen negative, vancomycin DC'd.   Ashby Dawes, M.D. 10/23/2016

## 2016-10-23 NOTE — Progress Notes (Signed)
Inpatient Diabetes Program Recommendations  AACE/ADA: New Consensus Statement on Inpatient Glycemic Control (2015)  Target Ranges:  Prepandial:   less than 140 mg/dL      Peak postprandial:   less than 180 mg/dL (1-2 hours)      Critically ill patients:  140 - 180 mg/dL   Lab Results  Component Value Date   GLUCAP 69 10/23/2016   HGBA1C 5.9 (H) 10/22/2016    Review of Glycemic Control  Results for John Mendoza, [name] (MRN [id number]) as of 10/23/2016 10:05  Ref. Range 10/22/2016 21:29 10/22/2016 23:12 10/23/2016 00:46 10/23/2016 07:24  Glucose-Capillary Latest Ref Range: 65 - 99 mg/dL 155 (H) 144 (H) 92 69    Diabetes history: Type 2 Outpatient Diabetes medications: Metformin 500mg  bid Current orders for Inpatient glycemic control: Metformin 500mg  bid, novolog 0-9 units tid, novolog 0-5 units qhs  Inpatient Diabetes Program Recommendations:  Please consider d/c Metformin since patient is NPO and change Novolog sensitive correction to q4h.  Gentry Fitz, RN, BA, MHA, CDE Diabetes Coordinator Inpatient Diabetes Program  [phone] (Team Pager) [phone] ([place]) 10/23/2016 10:08 AM

## 2016-10-23 NOTE — Consult Note (Signed)
Name: John Mendoza MRN: [id number] DOB: 08/31/1945    ADMISSION DATE:  10/22/2016 CONSULTATION DATE: 10/23/16  REFERRING MD :  Dr. Jannifer Franklin  CHIEF COMPLAINT:  Acute shortness of breath  BRIEF PATIENT DESCRIPTION: 71 year old male with Acute Respiratory Failure secondary to worsening pneumonia/CHF exacerbation  SIGNIFICANT EVENTS  4/19 >> Patient admitted to the ICU with SOB, On BiPAP  STUDIES:  10/22/16 echo:    HISTORY OF PRESENT ILLNESS:  John Mendoza is a 71 year old male with Known History of Barrets esophagus,Basal cell Carcinoma,CAD,CHF,COPD,Depression , HTN,AFIB,Parkinson'sand GERD. On 4/18 Patient was admitted by the hospitalist service for pneumonia.  On 4/19 patient started to [place].  ABG was concerning for hypoxemia( 7.44/51/43/34.6).  CXR was concerning for dense consolidation in the right middle lung .  Patient was placed on BiPAP.  He was also noted to be in Afib with RVR,so therefore was started on cardizem drip which was subsequently was  stopped due to hypotension.  Patient was transferred to the ICU .CCM team was consulted for further management. 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  has a past surgical history that includes Anterior cervical  decomp/discectomy fusion (X 2); Radical neck dissection (Right, ~ 1998); Total knee arthroplasty (Right, 2011); Thyroidectomy (~ 1996 X 2); Cataract extraction w/ intraocular lens  implant, bilateral (Bilateral); Tonsillectomy; Excisional hemorrhoidectomy; Joint replacement; Back surgery; Posterior fusion cervical spine (X 1); Coronary angioplasty with stent; Coronary artery bypass graft (1996); Excision basal cell carcinoma; and PEG placement (N/A, 01/10/2016). Prior to Admission medications   Medication Sig Start Date End Date Taking? Authorizing Provider  acetaminophen (TYLENOL) 500 MG tablet Take 500 mg by mouth every 6 (six) hours as needed.   Yes Historical Provider, MD  ARIPiprazole (ABILIFY) 5 MG tablet Take 5 mg by mouth 2 (two) times daily.   Yes Historical Provider, MD  aspirin EC 81 MG tablet Take 81 mg by mouth at bedtime.    Yes Historical Provider, MD  atorvastatin (LIPITOR) 40 MG tablet Take 40 mg by mouth at bedtime.   Yes Historical Provider, MD  bisoprolol (ZEBETA) 10 MG tablet Take 10 mg by mouth daily.   Yes Historical Provider, MD  carbidopa-levodopa (SINEMET IR) 25-250 MG tablet Take 2 tablets by mouth 3 (three) times daily.    Yes Historical Provider, MD  clonazePAM (KLONOPIN) 1 MG tablet Take 1-1.5 mg by mouth at bedtime. Take 1 mg in the morning and 1.5 mg at night. 09/20/14  Yes Historical Provider, MD  HYDROcodone-acetaminophen (NORCO/VICODIN) 5-325 MG tablet Take 1 tablet by mouth at bedtime.    Yes Historical Provider, MD  lamoTRIgine (LAMICTAL) 25 MG tablet Take 25 mg by mouth 2 (two) times daily.   Yes Historical Provider, MD  levothyroxine (SYNTHROID, LEVOTHROID) 137 MCG tablet Take 137 mcg by mouth daily.   Yes Historical Provider, MD  losartan (COZAAR) 25 MG tablet Take 25 mg by mouth at bedtime. 01/04/16  Yes Historical Provider,  MD  metFORMIN (GLUCOPHAGE) 500 MG tablet Take 500 mg by mouth 2 (two) times daily. 12/12/14  Yes Historical Provider, MD  nitroGLYCERIN  (NITROSTAT) 0.4 MG SL tablet Place 0.4 mg under the tongue every 5 (five) minutes x 3 doses as needed for chest pain.    Yes Historical Provider, MD  omeprazole (PRILOSEC) 20 MG capsule Take 20 mg by mouth 2 (two) times daily before a meal.    Yes Historical Provider, MD  PARoxetine (PAXIL) 20 MG tablet Take 60 mg by mouth daily.  07/30/10  Yes Historical Provider, MD  tamsulosin (FLOMAX) 0.4 MG CAPS Take 0.4 mg by mouth daily.    Yes Historical Provider, MD  triamcinolone cream (KENALOG) 0.5 % Apply 1 application topically 2 (two) times daily as needed (rash).  11/07/15  Yes Historical Provider, MD  warfarin (COUMADIN) 5 MG tablet Take 1 tablet (5 mg total) by mouth daily. Patient taking differently: Take 6 mg by mouth daily.  03/08/15  Yes Adin Hector, MD  bisacodyl (DULCOLAX) 10 MG suppository Place 1 suppository (10 mg total) rectally daily as needed for moderate constipation. Patient not taking: Reported on 10/22/2016 02/01/15   Adin Hector, MD  fentaNYL (DURAGESIC - DOSED MCG/HR) 50 MCG/HR Place 1 patch (50 mcg total) onto the skin every 3 (three) days. Patient not taking: Reported on 10/22/2016 02/08/15   Orson Eva, MD  Respiratory Therapy Supplies (FLUTTER) DEVI USE AS DIRECTED 02/21/16   Laverle Hobby, MD  sulfamethoxazole-trimethoprim (BACTRIM DS) 800-160 MG tablet Take 1 tablet by mouth 2 (two) times daily. Patient not taking: Reported on 10/22/2016 01/12/16   Carrie Mew, MD  sulfamethoxazole-trimethoprim (BACTRIM DS,SEPTRA DS) 800-160 MG tablet Take 1 tablet by mouth 2 (two) times daily. X 14 days Patient not taking: Reported on 10/22/2016 02/27/16   Laverle Hobby, MD   Allergies  Allergen Reactions  . Other Other (See Comments)    Dizziness, lightheadedness, Pt states that inhaled medications make him depressed and have negative thoughts.    . Sulfa Antibiotics Rash    Headache    FAMILY HISTORY:  family history includes Cancer in his maternal grandmother and  paternal aunt; Leukemia in his paternal grandmother; Other in his father; Rheum arthritis in his father. SOCIAL HISTORY:  reports that he quit smoking about 21 years ago. His smoking use included Cigarettes. He has a 70.00 pack-year smoking history. He has never used smokeless tobacco. He reports that he does not drink alcohol or use drugs.  REVIEW OF SYSTEMS:   Constitutional: Negative for fever, chills, weight loss, malaise/fatigue and diaphoresis.  HENT: Negative for hearing loss, ear pain, nosebleeds, congestion, sore throat, neck pain, tinnitus and ear discharge.   Eyes: Negative for blurred vision, double vision, photophobia, pain, discharge and redness.  Respiratory: Negative for cough, hemoptysis, sputum production, shortness of breath, wheezing and stridor.   Cardiovascular: Negative for chest pain, palpitations, orthopnea, claudication, leg swelling and PND.  Gastrointestinal: Negative for heartburn, nausea, vomiting, abdominal pain, diarrhea, constipation, blood in stool and melena.  Genitourinary: Negative for dysuria, urgency, frequency, hematuria and flank pain.  Musculoskeletal: Negative for myalgias, back pain, joint pain and falls.  Skin: Negative for itching and rash.  Neurological: Negative for dizziness, tingling, tremors, sensory change, speech change, focal weakness, seizures, loss of consciousness, weakness and headaches.  Endo/Heme/Allergies: Negative for environmental allergies and polydipsia. Does not bruise/bleed easily.  SUBJECTIVE: Patient is on Bipap with FiO2 OF 100% satting 100% on monitor  VITAL SIGNS: Temp:  [  97.8 F (36.6 C)-101.3 F (38.5 C)] 97.8 F (36.6 C) (04/18 2337) Pulse Rate:  [91-200] 113 (04/19 0020) Resp:  [17-33] 18 (04/18 1939) BP: (82-162)/(47-108) 87/61 (04/19 0020) SpO2:  [80 %-100 %] 98 % (04/19 0020) FiO2 (%):  [32 %-100 %] 100 % (04/18 2047) Weight:  [59 kg (130 lb)] 59 kg (130 lb) (04/18 1118)  PHYSICAL EXAMINATION: General:   Elderly white male, on BiPAP in no acute distress at this time Neuro:  Awake, Alert and oriented HEENT:  AT,[place],No JVD Cardiovascular:  s1s2,regular,ST,no m/r/g Lungs:  Diminished right middle and lower lobe, no wheezes, crackles, rhonchi noted Abdomen:  Soft, NT,ND,+bs Musculoskeletal: no edema,cyanosis Skin:  Warm,dry and intact   Recent Labs Lab 10/22/16 1116  NA 135  K 4.2  CL 97*  CO2 29  BUN 27*  CREATININE 0.60*  GLUCOSE 98    Recent Labs Lab 10/22/16 1116  HGB 11.0*  HCT 33.5*  WBC 4.7  PLT 148*   Dg Chest 2 View  Result Date: 10/22/2016 CLINICAL DATA:  Recently diagnosed with aspiration pneumonia, congested cough. History of COPD and CHF and gastroesophageal reflux. Head and neck malignancy, diabetes. EXAM: CHEST  2 VIEW COMPARISON:  PA and lateral chest x-ray of January 12, 2016 and CT scan of the chest of the same day. FINDINGS: The right lung is adequately inflated and clear. On the left there is patchy increased density at the lung base consistent with subsegmental atelectasis. Confluent density in the medial aspect of the left retrocardiac region in is present . There is a small left pleural effusion layering posteriorly. There is a trace of pleural fluid on the right. The cardiac silhouette is enlarged. The pulmonary vascularity is mildly prominent centrally. There are post CABG changes. There is calcification in the wall of the aortic arch. IMPRESSION: Probable small focus of left lower lobe atelectasis or pneumonia. Small left pleural effusion with trace right pleural effusion. Underlying COPD. Low-grade compensated CHF. Thoracic aortic atherosclerosis. Electronically Signed   By: Gracie Gupta  [place] M.D.   On: 10/22/2016 11:50   Dg Chest Port 1 View  Result Date: 10/22/2016 CLINICAL DATA:  Worsening cough and low oxygen saturation. EXAM: PORTABLE CHEST 1 VIEW COMPARISON:  10/22/2016 FINDINGS: Postoperative changes in the mediastinum. Cardiac enlargement without vascular  congestion. Since the previous study, there is interval development of dense consolidation in the right middle lung and left lingula consistent with developing atelectasis, aspiration, or pneumonia. Bronchial obstruction due to mucous plugging or inhaled material is not excluded. No blunting of costophrenic angles. No pneumothorax. Calcified aorta. Degenerative changes in the spine. IMPRESSION: New development of dense consolidation in the right middle lung and lingula likely represent pneumonia or atelectasis. Bronchial obstruction is not excluded. Electronically Signed   By: Lucienne Capers M.D.   On: 10/22/2016 23:52    ASSESSMENT / PLAN:  -Acute on Chronic  Respiratory failure with Hypoxia -increased right middle lung consolidation -History of Serratia and methicillin sensitive Staphylococcus aureus in the past -COPD -CHF - Hypotension - History of Afib  Plan -Will continue Bipap, wean as tolerated, Keep O2>92% - Patient is at high risk for intubation -Follow Cultures -Agree with antibiotics vancomycin/cefipime -Bronchodilators -Normotensive currently -Metoprolol PRN to keep HR<115 -Continue Warfarin -Follow ECHO  - Rest per primary. -  Bincy Varughese,AG-ACNP Pulmonary and [place]   10/23/2016, 1:02 AM   PCCM ATTENDING ATTESTATION:  I have evaluated patient and discussed with the APP Varughese, reviewed database in its entirety  and discussed care plan in detail. In addition, this patient was discussed on multidisciplinary rounds.   Important exam findings: No overt distress on  O2 Cognition appears to be intact + tremor of face and BUEs R>L rhonchi, no wheezes IRIR, tachy G tube present, abd exam benign No edema  CXR: RML AS dz - infiltrate and volume loss   Major problems addressed by PCCM team: Acute hypoxemic respiratory failure RML PNA PAF with RVR - Cardiology managing Hypotension on diltiazem - resolved Severe  Parkinson's  PLAN/REC: PRN BiPAP Supplemental O2 Abx as above Mgmt of AFRVR per Cards   Merton Border, MD PCCM service Mobile [phone] Pager [phone] 10/23/2016 2:29 PM

## 2016-10-23 NOTE — Consult Note (Signed)
[place]  CARDIOLOGY CONSULT NOTE  Patient ID: John Mendoza MRN: [id number] DOB/AGE: 07/08/1945 70 y.o.  Admit date: 10/22/2016 Referring Physician Dr. Leslye Peer Primary Physician   Primary Cardiologist Dr. Ubaldo Glassing Reason for Consultation afib witih rvr/troponin  HPI: Patient is a 71 year old male previously followed in our office with history of Parkinson's disease, recurrent aspiration, diabetes, hypertension, coronary artery disease as well as peripheral vascular disease as well as cardiomyopathy. He has had difficult to treat Parkinson's disease due to requirement for a PEG tube. He has a history of coronary artery disease status post coronary artery bypass grafting at [facility] in 1995. Subsequent cardiac catheterization in 2008 revealed occluded left internal mammary artery as well as saphenous vein graft to the PDA occlusion. He had repeat stenting of the SVG to the PDA. He had a cardiac catheterization in 2010 showing patent stent. He has been treated medically for this. He has a history of a cardiomyopathy with a known ejection fraction of 40-45%.. Echo during this admission was limited by tachycardia suggesting a possible worsening of his EF however tachycardia precludes adequate estimation of ejection fraction. He was admitted with shortness of breath had further respiratory compromise with increased ventricular response with his A. fib and he was transferred from telemetry to the ICU. He is being treated with BiPAP. Her this morning was taken off of BiPAP and was noted to have an increased heart rate. He received doses of IV metoprolol. He also has received a trial of IV digoxin and a bolus of normal saline with no improvement. He continues to have A. fib with rapid ventricular response. He has been fairly hemodynamically stable with this. Extremely dyspneic.  Review of Systems  Constitutional: Positive for malaise/fatigue and  weight loss.  HENT: Negative.   Eyes: Negative.   Respiratory: Positive for cough, shortness of breath and wheezing.   Cardiovascular: Positive for palpitations.  Gastrointestinal: Negative.   Genitourinary: Negative.   Musculoskeletal: Negative.   Skin: Negative.   Neurological: Negative.   Endo/Heme/Allergies: Bruises/bleeds easily.  Psychiatric/Behavioral: Positive for depression. Negative for suicidal ideas.    Past Medical History:  Diagnosis Date  . Anxiety   . Arthritis   . Barrett esophagus   . Basal cell carcinoma   . CAD (coronary artery disease)    on 2l home oxygen  . CHF (congestive heart failure) ([place])   . Chronic lower back pain   . COPD (chronic obstructive pulmonary disease) ([place])   . Depression   . DJD (degenerative joint disease)   . DVT (deep venous thrombosis) ([place]) 11/2013; 02/01/2015   on coumadin  . GERD (gastroesophageal reflux disease)   . 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 Heart attack ([place]) 03/20/1995  . History of gout   . Hyperlipidemia   . Hypertension   . Hypothyroidism   . Iron deficiency anemia   . Lumbar spinal stenosis   . Multiple pulmonary nodules   . On home oxygen therapy   . Parkinson's disease ([place])   . Recurrent aspiration pneumonia ([place])   . Seizures ([place])   . Sleep apnea   . Throat cancer ([place])   . Type II diabetes mellitus (HCC)     Family History  Problem Relation Age of Onset  . Leukemia Paternal Grandmother   . Cancer Maternal Grandmother     stomach  . Cancer Paternal Aunt   . Other Father  bacterial endocarditis  . Rheum arthritis Father     Social History   Social History  . Marital status: Married    Spouse name: N/A  . Number of children: 2  . Years of education: N/A   Occupational History  . Retired     has worked in [employer] in the past, exposed to lint.    Social History Main Topics  . Smoking status: Former Smoker    Packs/day: 2.00     Years: 35.00    Types: Cigarettes    Quit date: 03/20/1995  . Smokeless tobacco: Never Used  . Alcohol use No     Comment: 02/01/2015 "stopped drinking in ~ 1996; never had problem w/it"  . Drug use: No  . Sexual activity: No   Other Topics Concern  . Not on file   Social History Narrative   Lives at home, has a cane and a walker    Past Surgical History:  Procedure Laterality Date  . ANTERIOR CERVICAL DECOMP/DISCECTOMY FUSION  X 2  . BACK SURGERY    . BASAL CELL CARCINOMA EXCISION    . CATARACT EXTRACTION W/ INTRAOCULAR LENS  IMPLANT, BILATERAL Bilateral   . CORONARY ANGIOPLASTY WITH STENT PLACEMENT    . CORONARY ARTERY BYPASS GRAFT  1996   "CABG X3"  . EXCISIONAL HEMORRHOIDECTOMY    . JOINT REPLACEMENT    . PEG PLACEMENT N/A 01/10/2016   Procedure: PERCUTANEOUS ENDOSCOPIC GASTROSTOMY (PEG) PLACEMENT;  Surgeon: Lucilla Lame, MD;  Location: [facility] ENDOSCOPY;  Service: Endoscopy;  Laterality: N/A;  . POSTERIOR FUSION CERVICAL SPINE  X 1  . RADICAL NECK DISSECTION Right ~ 1998   "throat cancer"  . THYROIDECTOMY  ~ 1996 X 2  . TONSILLECTOMY    . TOTAL KNEE ARTHROPLASTY Right 2011     Prescriptions Prior to Admission  Medication Sig Dispense Refill Last Dose  . acetaminophen (TYLENOL) 500 MG tablet Take 500 mg by mouth every 6 (six) hours as needed.   PRN at PRN  . ARIPiprazole (ABILIFY) 5 MG tablet Take 5 mg by mouth 2 (two) times daily.   10/22/2016 at 0800  . aspirin EC 81 MG tablet Take 81 mg by mouth at bedtime.    10/21/2016 at 2000  . atorvastatin (LIPITOR) 40 MG tablet Take 40 mg by mouth at bedtime.   10/21/2016 at 2000  . bisoprolol (ZEBETA) 10 MG tablet Take 10 mg by mouth daily.   10/22/2016 at 0800  . carbidopa-levodopa (SINEMET IR) 25-250 MG tablet Take 2 tablets by mouth 3 (three) times daily.    10/22/2016 at 0800  . clonazePAM (KLONOPIN) 1 MG tablet Take 1-1.5 mg by mouth at bedtime. Take 1 mg in the morning and 1.5 mg at night.   10/21/2016 at 2000  .  HYDROcodone-acetaminophen (NORCO/VICODIN) 5-325 MG tablet Take 1 tablet by mouth at bedtime.    10/21/2016 at 2000  . lamoTRIgine (LAMICTAL) 25 MG tablet Take 25 mg by mouth 2 (two) times daily.   10/22/2016 at 0800  . levothyroxine (SYNTHROID, LEVOTHROID) 137 MCG tablet Take 137 mcg by mouth daily.   10/22/2016 at 0800  . losartan (COZAAR) 25 MG tablet Take 25 mg by mouth at bedtime.   10/21/2016 at 2000  . metFORMIN (GLUCOPHAGE) 500 MG tablet Take 500 mg by mouth 2 (two) times daily.   10/22/2016 at 0800  . nitroGLYCERIN (NITROSTAT) 0.4 MG SL tablet Place 0.4 mg under the tongue every 5 (five) minutes x 3 doses as needed  for chest pain.    PRN at PRN  . omeprazole (PRILOSEC) 20 MG capsule Take 20 mg by mouth 2 (two) times daily before a meal.    10/22/2016 at 0800  . PARoxetine (PAXIL) 20 MG tablet Take 60 mg by mouth daily.    10/22/2016 at 0800  . tamsulosin (FLOMAX) 0.4 MG CAPS Take 0.4 mg by mouth daily.    10/21/2016 at 2000  . triamcinolone cream (KENALOG) 0.5 % Apply 1 application topically 2 (two) times daily as needed (rash).    PRN at PRN  . warfarin (COUMADIN) 5 MG tablet Take 1 tablet (5 mg total) by mouth daily. (Patient taking differently: Take 6 mg by mouth daily. ) 30 tablet 11 10/21/2016 at 2000  . bisacodyl (DULCOLAX) 10 MG suppository Place 1 suppository (10 mg total) rectally daily as needed for moderate constipation. (Patient not taking: Reported on 10/22/2016) 12 suppository 0 Completed Course at Unknown time  . fentaNYL (DURAGESIC - DOSED MCG/HR) 50 MCG/HR Place 1 patch (50 mcg total) onto the skin every 3 (three) days. (Patient not taking: Reported on 10/22/2016) 10 patch 0 Completed Course at Unknown time  . Respiratory Therapy Supplies (FLUTTER) DEVI USE AS DIRECTED 1 each 0   . sulfamethoxazole-trimethoprim (BACTRIM DS) 800-160 MG tablet Take 1 tablet by mouth 2 (two) times daily. (Patient not taking: Reported on 10/22/2016) 14 tablet 0 Completed Course at Unknown time  .  sulfamethoxazole-trimethoprim (BACTRIM DS,SEPTRA DS) 800-160 MG tablet Take 1 tablet by mouth 2 (two) times daily. X 14 days (Patient not taking: Reported on 10/22/2016) 28 tablet 0 Completed Course at Unknown time    Physical Exam: Blood pressure (!) 124/94, pulse (!) 208, temperature (!) 100.7 F (38.2 C), temperature source Axillary, resp. rate (!) 29, height 5\' 9"  (1.753 m), weight 59 kg (130 lb), SpO2 97 %.   Wt Readings from Last 1 Encounters:  10/22/16 59 kg (130 lb)     General appearance: moderate distress Head: Normocephalic, without obvious abnormality, atraumatic Resp: rhonchi bilaterally and wheezes bilaterally Cardio: irregularly irregular rhythm GI: abnormal findings:  absent bowel sounds Extremities: edema 2-3+ edema Neurologic: Grossly normal  Labs:   Lab Results  Component Value Date   WBC 4.0 10/23/2016   HGB 11.4 (L) 10/23/2016   HCT 35.1 (L) 10/23/2016   MCV 82.7 10/23/2016   PLT 150 10/23/2016    Recent Labs Lab 10/23/16 0336  NA 135  K 4.1  CL 95*  CO2 32  BUN 23*  CREATININE 0.58*  CALCIUM 8.8*  GLUCOSE 97   Lab Results  Component Value Date   CKTOTAL 184 11/29/2015   TROPONINI 0.04 ([place]) 10/23/2016      Radiology: Small left pleural effusion with trace right pleural effusion on chest x-ray with COPD and probable right middle lobe EKG: Atrial fibrillation with rapid ventricular response  ASSESSMENT AND PLAN: 71 year old male with multiple medical problems including coronary disease status post coronary bypass grafting, at least moderate ischemic cardio myopathy with a previous EF of 40-45%. Echo during this admission suggests reduced LV function from this however the tachycardia makes assessment of ejection fraction very difficult. He is currently being treated for probable aspiration pneumonia. He has had course complicated by atrial fibrillation with rapid ventricular response. He has not responded to IV digoxin IV metoprolol. Will place on IV  amiodarone load and drip and follow rate. He is anticoagulated with warfarin will continue with this for now however will need to closely follow INRs given  antibiotic use. We'll follow along. Aggressive treatment of his pulmonary disease requiring BiPAP. Poor prognosis. He has elevated serum troponin likely due to demand ischemia. Does not appear to be in acute coronary event causing this admission. We'll discontinue bisoprolol while on amiodarone to allow control of his blood pressure and to keep hemodynamic stable. Signed: Teodoro Spray MD, [facility] 10/23/2016, 5:29 PM

## 2016-10-23 NOTE — Progress Notes (Signed)
Pt. With progressively worsening hypoxemia shortness of breath and tachypnea. ABG drawn revealed PaO2 43  PCO2 51  PH 7.44. On 100%  Nonrebreathing mask. Pt. Placed on BIPAP 12/6  Back up rate 10  FiO1 100%. Pt. Tolerating well with improved O2 saturation of 92%. Will continue to assess.

## 2016-10-23 NOTE — Progress Notes (Signed)
Notified spouse that patients has been transferred to [facility] Mendoza.

## 2016-10-24 ENCOUNTER — Inpatient Hospital Stay: Payer: Medicare Other

## 2016-10-24 DIAGNOSIS — E43 Unspecified severe protein-calorie malnutrition: Secondary | ICD-10-CM | POA: Insufficient documentation

## 2016-10-24 LAB — BASIC METABOLIC PANEL
ANION GAP: 7 (ref 5–15)
BUN: 28 mg/dL — ABNORMAL HIGH (ref 6–20)
CALCIUM: 9 mg/dL (ref 8.9–10.3)
CO2: 34 mmol/L — AB (ref 22–32)
Chloride: 94 mmol/L — ABNORMAL LOW (ref 101–111)
Creatinine, Ser: 0.5 mg/dL — ABNORMAL LOW (ref 0.61–1.24)
Glucose, Bld: 97 mg/dL (ref 65–99)
POTASSIUM: 4 mmol/L (ref 3.5–5.1)
Sodium: 135 mmol/L (ref 135–145)

## 2016-10-24 LAB — CBC
HEMATOCRIT: 32.3 % — AB (ref 40.0–52.0)
HEMOGLOBIN: 10.6 g/dL — AB (ref 13.0–18.0)
MCH: 27 pg (ref 26.0–34.0)
MCHC: 32.8 g/dL (ref 32.0–36.0)
MCV: 82.3 fL (ref 80.0–100.0)
Platelets: 148 10*3/uL — ABNORMAL LOW (ref 150–440)
RBC: 3.93 MIL/uL — AB (ref 4.40–5.90)
RDW: 16.6 % — ABNORMAL HIGH (ref 11.5–14.5)
WBC: 7.4 10*3/uL (ref 3.8–10.6)

## 2016-10-24 LAB — VANCOMYCIN, TROUGH: VANCOMYCIN TR: 7 ug/mL — AB (ref 15–20)

## 2016-10-24 LAB — PROTIME-INR
INR: 1.89
PROTHROMBIN TIME: 22 s — AB (ref 11.4–15.2)

## 2016-10-24 LAB — GLUCOSE, CAPILLARY
GLUCOSE-CAPILLARY: 158 mg/dL — AB (ref 65–99)
GLUCOSE-CAPILLARY: 138 mg/dL — AB (ref 65–99)
GLUCOSE-CAPILLARY: 117 mg/dL — AB (ref 65–99)
GLUCOSE-CAPILLARY: 139 mg/dL — AB (ref 65–99)
Glucose-Capillary: 132 mg/dL — ABNORMAL HIGH (ref 65–99)

## 2016-10-24 LAB — TROPONIN I: Troponin I: 0.05 ng/mL (ref <0.03)

## 2016-10-24 MED ORDER — PHENOL 1.4 % MT LIQD
1.0000 | OROMUCOSAL | Status: DC | PRN
  Filled 2016-10-24: qty 177

## 2016-10-24 MED ORDER — VANCOMYCIN HCL 1000 MG IV SOLR
750.0000 mg | Freq: Three times a day (TID) | INTRAVENOUS | Status: DC
  Administered 2016-10-24 – 2016-10-25 (×2): 750 mg via INTRAVENOUS
  Filled 2016-10-24 (×4): qty 750

## 2016-10-24 MED ORDER — PHENOL 1.4 % MT LIQD
1.0000 | OROMUCOSAL | Status: DC | PRN
  Administered 2016-10-24: 1 via OROMUCOSAL
  Filled 2016-10-24: qty 177

## 2016-10-24 NOTE — Progress Notes (Signed)
[place]  SUBJECTIVE: still requiring bipap.    Vitals:   10/24/16 0521 10/24/16 0530 10/24/16 0600 10/24/16 0630  BP:  113/78 (!) 90/55 111/77  Pulse:  (!) 113 (!) 109 (!) 111  Resp:  (!) 21 17 (!) 21  Temp: 97.4 F (36.3 C)     TempSrc: Axillary     SpO2: 100% 98% 95% 100%  Weight:      Height:        Intake/Output Summary (Last 24 hours) at 10/24/16 0802 Last data filed at 10/24/16 0600  Gross per 24 hour  Intake           954.27 ml  Output              650 ml  Net           304.27 ml    LABS: Basic Metabolic Panel:  Recent Labs  10/23/16 0336 10/24/16 0410  NA 135 135  K 4.1 4.0  CL 95* 94*  CO2 32 34*  GLUCOSE 97 97  BUN 23* 28*  CREATININE 0.58* 0.50*  CALCIUM 8.8* 9.0   Liver Function Tests: No results for input(s): AST, ALT, ALKPHOS, BILITOT, PROT, ALBUMIN in the last 72 hours. No results for input(s): LIPASE, AMYLASE in the last 72 hours. CBC:  Recent Labs  10/22/16 1116 10/23/16 0336 10/24/16 0410  WBC 4.7 4.0 7.4  NEUTROABS 3.7  --   --   HGB 11.0* 11.4* 10.6*  HCT 33.5* 35.1* 32.3*  MCV 83.3 82.7 82.3  PLT 148* 150 148*   Cardiac Enzymes:  Recent Labs  10/22/16 2336 10/23/16 0336 10/24/16 0410  TROPONINI 0.04* 0.04* 0.05*   BNP: Invalid input(s): POCBNP D-Dimer: No results for input(s): DDIMER in the last 72 hours. Hemoglobin A1C:  Recent Labs  10/22/16 1758  HGBA1C 5.9*   Fasting Lipid Panel: No results for input(s): CHOL, HDL, LDLCALC, TRIG, CHOLHDL, LDLDIRECT in the last 72 hours. Thyroid Function Tests:  Recent Labs  10/22/16 1758  TSH 0.859   Anemia Panel: No results for input(s): VITAMINB12, FOLATE, FERRITIN, TIBC, IRON, RETICCTPCT in the last 72 hours.   Physical Exam: Blood pressure 111/77, pulse (!) 111, temperature 97.4 F (36.3 C), temperature source Axillary, resp. rate (!) 21, height 5\' 9"  (1.753 m), weight 59 kg (130 lb), SpO2 100 %.   Wt Readings from Last  1 Encounters:  10/22/16 59 kg (130 lb)     General appearance: cooperative and mild distress Resp: rhonchi bilaterally Cardio: irregularly irregular rhythm GI: soft, non-tender; bowel sounds normal; no masses,  no organomegaly Neurologic: Grossly normal  TELEMETRY: Reviewed telemetry pt in afib with variable vr. Improved from previous 24 hou8rs. :  ASSESSMENT AND PLAN:  Active Problems:   Acute on chronic respiratory failure with hypoxia (HCC)-still requiring bipap. CXR this am showed improved aeration. Continue with bipap and wean as possible and continue with bronchodilators.No CHF.    Community acquired pneumonia-improved cxr. Continue abx.     Elevated troponin-demand ischemia. No evidence of acs    Atrial fib-rate is improved with amiodarone drip. INR is sub therapeutic. Goal between 2 and 3.  Would continue to titrate.     Teodoro Spray, MD, [facility] 10/24/2016 8:02 AM

## 2016-10-24 NOTE — Progress Notes (Signed)
Pharmacy Antibiotic Note  John Mendoza is a 71 y.o. male admitted on 10/22/2016 with pneumonia.  Pharmacy has been consulted for vancomycin dosing.   Plan: Will increase vancomycin dosing to 750 mg iv q 8 hours and recheck a trough with the 4 dose. Goal trough 15-20 mcg/ml. F/U culture results.   Pt also receiving cefepime 1 g IV q 8 hours  Height: 5\' 9"  (175.3 cm) Weight: 130 lb (59 kg) IBW/kg (Calculated) : 70.7  Temp (24hrs), Avg:98.1 F (36.7 C), Min:97.1 F (36.2 C), Max:99.1 F (37.3 C)   Recent Labs Lab 10/22/16 1116 10/22/16 1609 10/22/16 1758 10/23/16 0336 10/24/16 0410 10/24/16 1243  WBC 4.7  --   --  4.0 7.4  --   CREATININE 0.60*  --   --  0.58* 0.50*  --   LATICACIDVEN  --  1.6 1.6  --   --   --   VANCOTROUGH  --   --   --   --   --  7*    Estimated Creatinine Clearance: 70.7 mL/min (A) (by C-G formula based on SCr of 0.5 mg/dL (L)).    Allergies  Allergen Reactions  . Other Other (See Comments)    Dizziness, lightheadedness, Pt states that inhaled medications make him depressed and have negative thoughts.    . Sulfa Antibiotics Rash    Headache    Antimicrobials this admission: azithromycin 4/18 x 1 ceftriaxone 4/18 x 1 Cefepime 4/18 >> Vancomycin 4/18 >>  Dose adjustments this admission:  Microbiology results: 4/18 BCx: NGTD 4/18 Sputum: S aureus 4/18 MRSA PCR: negative  Thank you for allowing pharmacy to be a part of this patient's care.  Ulice Dash, PharmD Clinical Pharmacist  10/24/2016 4:03 PM

## 2016-10-24 NOTE — Progress Notes (Signed)
Patient ID: John Mendoza, male   DOB: 08-31-1945, 71 y.o.   MRN: [id number]   [facility] PROGRESS NOTE  John Mendoza YPP:[id number] DOB: [birth year] DOA: 10/22/2016 PCP: Adin Hector, MD  HPI/Subjective: Patient seen earlier on BiPAP. The patient thinks that he can breathe off the BiPAP. He still feels a little short of breath. Still some coughing. Was placed on amiodarone last night for rapid atrial fibrillation.  Objective: Vitals:   10/24/16 0900 10/24/16 1000  BP: 118/79 127/82  Pulse: (!) 111 (!) 112  Resp: 20 (!) 23  Temp:  98.5 F (36.9 C)    Filed Weights   10/22/16 1118  Weight: 59 kg (130 lb)    ROS: Review of Systems  Constitutional: Negative for chills and fever.  Eyes: Negative for blurred vision.  Respiratory: Positive for cough, shortness of breath and wheezing.   Cardiovascular: Negative for chest pain.  Gastrointestinal: Negative for abdominal pain, constipation, diarrhea, nausea and vomiting.  Genitourinary: Negative for dysuria.  Musculoskeletal: Negative for joint pain.  Neurological: Negative for dizziness and headaches.   Exam: Physical Exam  Constitutional: He is oriented to person, place, and time.  HENT:  Nose: No mucosal edema.  Mouth/Throat: No oropharyngeal exudate or posterior oropharyngeal edema.  Eyes: Conjunctivae, EOM and lids are normal. Pupils are equal, round, and reactive to light.  Neck: No JVD present. Carotid bruit is not present. No edema present. No thyroid mass and no thyromegaly present.  Cardiovascular: S1 normal, S2 normal and normal heart sounds.  Tachycardia present.  Exam reveals no gallop.   No murmur heard. Pulses:      Dorsalis pedis pulses are 2+ on the right side, and 2+ on the left side.  Respiratory: No respiratory distress. He has decreased breath sounds in the right middle field, the right lower field, the left middle field and the left lower field. He has no wheezes. He has rhonchi in the right middle  field, the right lower field, the left middle field and the left lower field. He has no rales.  GI: Soft. Bowel sounds are normal. There is no tenderness.  Musculoskeletal:       Right ankle: He exhibits no swelling.       Left ankle: He exhibits no swelling.  Lymphadenopathy:    He has no cervical adenopathy.  Neurological: He is alert and oriented to person, place, and time. No cranial nerve deficit.  Skin: Skin is warm. No rash noted. Nails show no clubbing.  Psychiatric: He has a normal mood and affect.      Data Reviewed: Basic Metabolic Panel:  Recent Labs Lab 10/22/16 1116 10/23/16 0336 10/24/16 0410  NA 135 135 135  K 4.2 4.1 4.0  CL 97* 95* 94*  CO2 29 32 34*  GLUCOSE 98 97 97  BUN 27* 23* 28*  CREATININE 0.60* 0.58* 0.50*  CALCIUM 9.1 8.8* 9.0   CBC:  Recent Labs Lab 10/22/16 1116 10/23/16 0336 10/24/16 0410  WBC 4.7 4.0 7.4  NEUTROABS 3.7  --   --   HGB 11.0* 11.4* 10.6*  HCT 33.5* 35.1* 32.3*  MCV 83.3 82.7 82.3  PLT 148* 150 148*   Cardiac Enzymes:  Recent Labs Lab 10/22/16 1116 10/22/16 1758 10/22/16 2336 10/23/16 0336 10/24/16 0410  TROPONINI 0.04* 0.03* 0.04* 0.04* 0.05*   BNP (last 3 results)  Recent Labs  10/22/16 1116  BNP 601.0*     CBG:  Recent Labs Lab 10/23/16 1817 10/23/16 2116  10/24/16 0105 10/24/16 0731 10/24/16 1220  GLUCAP 177* 142* 158* 138* 117*    Recent Results (from the past 240 hour(s))  Blood culture (routine x 2)     Status: None (Preliminary result)   Collection Time: 10/22/16  2:02 PM  Result Value Ref Range Status   Specimen Description BLOOD R FA  Final   Special Requests   Final    BOTTLES DRAWN AEROBIC AND ANAEROBIC Blood Culture adequate volume   Culture NO GROWTH 2 DAYS  Final   Report Status PENDING  Incomplete  Blood culture (routine x 2)     Status: None (Preliminary result)   Collection Time: 10/22/16  2:02 PM  Result Value Ref Range Status   Specimen Description BLOOD L FA  Final    Special Requests   Final    BOTTLES DRAWN AEROBIC AND ANAEROBIC Blood Culture adequate volume   Culture NO GROWTH 2 DAYS  Final   Report Status PENDING  Incomplete  Culture, expectorated sputum-assessment     Status: None   Collection Time: 10/22/16  3:06 PM  Result Value Ref Range Status   Specimen Description SPU  Final   Special Requests NONE  Final   Sputum evaluation THIS SPECIMEN IS ACCEPTABLE FOR SPUTUM CULTURE  Final   Report Status 10/22/2016 FINAL  Final  Culture, respiratory (NON-Expectorated)     Status: None (Preliminary result)   Collection Time: 10/22/16  3:06 PM  Result Value Ref Range Status   Specimen Description SPU  Final   Special Requests NONE Reflexed from [id number]  Final   Gram Stain   Final    ABUNDANT WBC PRESENT,BOTH PMN AND MONONUCLEAR ABUNDANT IN CLUSTERS IN PAIRS GRAM POSITIVE COCCI    Culture   Final    ABUNDANT STAPHYLOCOCCUS AUREUS SUSCEPTIBILITIES TO FOLLOW Performed at [facility], [place] [address] [place], [place] [address]    Report Status PENDING  Incomplete  MRSA PCR Screening     Status: None   Collection Time: 10/22/16  5:55 PM  Result Value Ref Range Status   MRSA by PCR NEGATIVE NEGATIVE Final    Comment:        The GeneXpert MRSA Assay (FDA approved for NASAL specimens only), is one component of a comprehensive MRSA colonization surveillance program. It is not intended to diagnose MRSA infection nor to guide or monitor treatment for MRSA infections.      Studies: Dg Chest Port 1 View  Result Date: 10/24/2016 CLINICAL DATA:  Respiratory failure EXAM: PORTABLE CHEST 1 VIEW COMPARISON:  10/22/2016 FINDINGS: Cardiac shadow is stable. Postsurgical changes are again noted. Significant improved aeration is noted in the bases bilaterally with only minimal residual atelectasis. No new focal abnormality is seen. IMPRESSION: Significant improved aeration in the bases bilaterally. Electronically Signed   By: Inez Catalina M.D.   On:  10/24/2016 07:04   Dg Chest Port 1 View  Result Date: 10/22/2016 CLINICAL DATA:  Worsening cough and low oxygen saturation. EXAM: PORTABLE CHEST 1 VIEW COMPARISON:  10/22/2016 FINDINGS: Postoperative changes in the mediastinum. Cardiac enlargement without vascular congestion. Since the previous study, there is interval development of dense consolidation in the right middle lung and left lingula consistent with developing atelectasis, aspiration, or pneumonia. Bronchial obstruction due to mucous plugging or inhaled material is not excluded. No blunting of costophrenic angles. No pneumothorax. Calcified aorta. Degenerative changes in the spine. IMPRESSION: New development of dense consolidation in the right middle lung and lingula likely represent pneumonia or  atelectasis. Bronchial obstruction is not excluded. Electronically Signed   By: Lucienne Capers M.D.   On: 10/22/2016 23:52    Scheduled Meds: . ARIPiprazole  5 mg Per Tube BID  . aspirin  81 mg Per Tube QHS  . atorvastatin  40 mg Per Tube QHS  . carbidopa-levodopa  2 tablet Per Tube TID  . chlorhexidine  15 mL Mouth Rinse BID  . chlorpheniramine-HYDROcodone  5 mL Per Tube Q12H  . clonazePAM  1.5 mg Per Tube QHS  . feeding supplement (ENSURE ENLIVE)  237 mL Per Tube 5 X Daily  . HYDROcodone-acetaminophen  1 tablet Per Tube QHS  . insulin aspart  0-5 Units Subcutaneous QHS  . insulin aspart  0-9 Units Subcutaneous TID WC  . ipratropium-albuterol  3 mL Nebulization Q4H  . lamoTRIgine  25 mg Per Tube BID  . levothyroxine  137 mcg Per Tube Daily  . mouth rinse  15 mL Mouth Rinse q12n4p  . PARoxetine  60 mg Per Tube Daily  . sodium chloride flush  3 mL Intravenous Q12H  . sodium chloride flush  3 mL Intravenous Q12H  . warfarin  3 mg Per Tube QPM   Continuous Infusions: . sodium chloride 250 mL (10/23/16 1830)  . sodium chloride 999 mL/hr at 10/22/16 2338  . amiodarone 30 mg/hr (10/24/16 0956)  . ceFEPime (MAXIPIME) IV Stopped  (10/24/16 0867)  . vancomycin (VANCOCIN) 750 mg/164mL IVPB Stopped (10/24/16 0150)    Assessment/Plan:  1. Acute on chronic respiratory failure with hypoxia. Hx Copd. Patient on continuous BiPAP. Case discussed with critical care specialist And they will try to wean off the BiPAP this afternoon and see how he does. 2. Pneumonia right middle lobe with history of aspiration pneumonia. MRSA PCR is negative but sputum culture growing staph. Patient on vancomycin and cefepime 3. Atrial fibrillation, borderline troponin.  Demand ischemia.  On amiodarone drip 4. Parkinson's disease with resting tremor on Sinemet 5. Type 2 diabetes mellitus on sliding scale only 6. History of DVT on Coumadin. INR 1.89 today 7. Chronic systolic congestive heart failure with an EF of 20-25%. Watch fluid status closely 8. Hyperlipidemia unspecified on Lipitor  9. Anxiety on clonazepam, abilify  10.  hypothyroidism unspecified on levothyroxine 11. Severe malnutrition.  Code Status:     Code Status Orders        Start     Ordered   10/22/16 1740  Full code  Continuous     10/22/16 1739    Code Status History    Date Active Date Inactive Code Status Order ID Comments User Context   01/04/2016 11:22 PM 01/11/2016  6:13 PM Full Code [id number]  Lance Coon, MD Inpatient   12/11/2015  5:09 PM 12/13/2015  4:53 PM Full Code [id number]  Fritzi Mandes, MD Inpatient   11/29/2015  9:13 PM 12/01/2015  1:58 PM Full Code [id number]  Gladstone Lighter, MD Inpatient   03/06/2015 10:40 AM 03/08/2015  2:22 PM Full Code [id number]  Adin Hector, MD Inpatient   02/01/2015  5:36 PM 02/08/2015  6:46 PM Full Code [id number]  Samella Parr, NP Inpatient   01/26/2015 10:05 PM 02/01/2015  5:36 PM Full Code [id number]  Demetrios Loll, MD Inpatient    Advance Directive Documentation     Most Recent Value  Type of Advance Directive  Out of facility DNR (pink MOST or yellow form)  Pre-existing out of facility DNR order (yellow form or pink MOST form)  -   "MOST"  Form in Place?  -     Family Communication: as per critical care specialist Disposition Plan: TBD  Consultants: - critical care specialist - cardiology  Antibiotics:  Cefepime  Vancomycin  Time spent: 24 minutes  [place], [place]

## 2016-10-24 NOTE — Progress Notes (Signed)
[place] for Warfarin Indication: h/o DVT  Allergies  Allergen Reactions  . Other Other (See Comments)    Dizziness, lightheadedness, Pt states that inhaled medications make him depressed and have negative thoughts.    . Sulfa Antibiotics Rash    Headache    Patient Measurements: Height: 5\' 9"  (175.3 cm) Weight: 130 lb (59 kg) IBW/kg (Calculated) : 70.7  Vital Signs: Temp: 98.5 F (36.9 C) (04/20 1000) Temp Source: Axillary (04/20 1000) BP: 127/82 (04/20 1000) Pulse Rate: 112 (04/20 1000)  Labs:  Recent Labs  10/22/16 1116 10/22/16 1758 10/22/16 2336 10/23/16 0336 10/24/16 0410  HGB 11.0*  --   --  11.4* 10.6*  HCT 33.5*  --   --  35.1* 32.3*  PLT 148*  --   --  150 148*  LABPROT  --  17.9*  --  17.6* 22.0*  INR  --  1.46  --  1.43 1.89  CREATININE 0.60*  --   --  0.58* 0.50*  TROPONINI 0.04* 0.03* 0.04* 0.04* 0.05*    Estimated Creatinine Clearance: 70.7 mL/min (A) (by C-G formula based on SCr of 0.5 mg/dL (L)).   Medical History: Past Medical History:  Diagnosis Date  . Anxiety   . Arthritis   . Barrett esophagus   . Basal cell carcinoma   . CAD (coronary artery disease)    on 2l home oxygen  . CHF (congestive heart failure) ([place])   . Chronic lower back pain   . COPD (chronic obstructive pulmonary disease) ([place])   . Depression   . DJD (degenerative joint disease)   . DVT (deep venous thrombosis) ([place]) 11/2013; 02/01/2015   on coumadin  . GERD (gastroesophageal reflux disease)   . 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 Heart attack ([place]) 03/20/1995  . History of gout   . Hyperlipidemia   . Hypertension   . Hypothyroidism   . Iron deficiency anemia   . Lumbar spinal stenosis   . Multiple pulmonary nodules   . On home oxygen therapy   . Parkinson's disease ([place])   . Recurrent aspiration pneumonia ([place])   . Seizures ([place])   . Sleep apnea   . Throat cancer ([place])    . Type II diabetes mellitus [facility])    Assessment: 71 y/o F with a h/o DVT on warfarin on antibiotics for pneumonia. Patient has orders to begin amiodarone for atrial fibrillation.   Goal of Therapy:  INR 2-3   Plan:  Will continue empirically reduced warfarin dose of 3 mg daily due to significant drug interactions. Will f/u AM INR.   Ulice Dash D 10/24/2016,3:58 PM

## 2016-10-24 NOTE — Plan of Care (Signed)
MD aware of troponins

## 2016-10-25 DIAGNOSIS — J9601 Acute respiratory failure with hypoxia: Secondary | ICD-10-CM

## 2016-10-25 DIAGNOSIS — J208 Acute bronchitis due to other specified organisms: Secondary | ICD-10-CM

## 2016-10-25 DIAGNOSIS — J9811 Atelectasis: Secondary | ICD-10-CM

## 2016-10-25 LAB — GLUCOSE, CAPILLARY
Glucose-Capillary: 152 mg/dL — ABNORMAL HIGH (ref 65–99)
Glucose-Capillary: 134 mg/dL — ABNORMAL HIGH (ref 65–99)
Glucose-Capillary: 104 mg/dL — ABNORMAL HIGH (ref 65–99)
Glucose-Capillary: 138 mg/dL — ABNORMAL HIGH (ref 65–99)

## 2016-10-25 LAB — CULTURE, RESPIRATORY W GRAM STAIN

## 2016-10-25 LAB — CULTURE, RESPIRATORY

## 2016-10-25 LAB — PROTIME-INR
INR: 1.51
PROTHROMBIN TIME: 18.4 s — AB (ref 11.4–15.2)

## 2016-10-25 LAB — VANCOMYCIN, TROUGH: Vancomycin Tr: 8 ug/mL — ABNORMAL LOW (ref 15–20)

## 2016-10-25 MED ORDER — DOXYCYCLINE HYCLATE 100 MG PO TABS
100.0000 mg | ORAL_TABLET | Freq: Two times a day (BID) | ORAL | Status: DC
  Administered 2016-10-25 – 2016-10-27 (×5): 100 mg
  Filled 2016-10-25 (×5): qty 1

## 2016-10-25 MED ORDER — LOSARTAN POTASSIUM 25 MG PO TABS
25.0000 mg | ORAL_TABLET | Freq: Every day | ORAL | Status: DC
  Administered 2016-10-25 – 2016-10-26 (×2): 25 mg
  Filled 2016-10-25 (×2): qty 1

## 2016-10-25 MED ORDER — WARFARIN SODIUM 5 MG PO TABS
5.0000 mg | ORAL_TABLET | Freq: Every day | ORAL | Status: DC
  Administered 2016-10-25: 5 mg
  Filled 2016-10-25: qty 1

## 2016-10-25 MED ORDER — AMIODARONE HCL 200 MG PO TABS
400.0000 mg | ORAL_TABLET | Freq: Two times a day (BID) | ORAL | Status: DC
  Administered 2016-10-25 – 2016-10-27 (×5): 400 mg
  Filled 2016-10-25 (×5): qty 2

## 2016-10-25 MED ORDER — BISOPROLOL FUMARATE 5 MG PO TABS
10.0000 mg | ORAL_TABLET | Freq: Every day | ORAL | Status: DC
  Administered 2016-10-26 – 2016-10-27 (×2): 10 mg
  Filled 2016-10-25 (×2): qty 2

## 2016-10-25 MED ORDER — BISOPROLOL FUMARATE 10 MG PO TABS
10.0000 mg | ORAL_TABLET | Freq: Every day | ORAL | Status: DC
  Administered 2016-10-25: 10 mg via ORAL
  Filled 2016-10-25: qty 1

## 2016-10-25 MED ORDER — GUAIFENESIN-DM 100-10 MG/5ML PO SYRP
5.0000 mL | ORAL_SOLUTION | Freq: Three times a day (TID) | ORAL | Status: DC
  Administered 2016-10-25 – 2016-10-27 (×6): 5 mL
  Filled 2016-10-25 (×6): qty 5

## 2016-10-25 MED ORDER — IPRATROPIUM-ALBUTEROL 0.5-2.5 (3) MG/3ML IN SOLN: 3.0000 mL | RESPIRATORY_TRACT | Status: DC | PRN

## 2016-10-25 MED ORDER — LOSARTAN POTASSIUM 25 MG PO TABS: 25.0000 mg | ORAL_TABLET | Freq: Every day | ORAL | Status: DC

## 2016-10-25 NOTE — Progress Notes (Signed)
       [place] [facility]  SUBJECTIVE: Less sob but still somewhat shakey   Vitals:   10/25/16 1000 10/25/16 1100 10/25/16 1200 10/25/16 1300  BP: 132/89 (!) 137/110 102/71 112/77  Pulse: (!) 106 (!) 104 (!) 102 (!) 108  Resp: 18 16 (!) 22 20  Temp:   99.3 F (37.4 C) 99.6 F (37.6 C)  TempSrc:   Oral Oral  SpO2: 95% 97% 93% 98%  Weight:      Height:        Intake/Output Summary (Last 24 hours) at 10/25/16 1337 Last data filed at 10/25/16 1000  Gross per 24 hour  Intake         60410.18 ml  Output              800 ml  Net         59610.18 ml    LABS: Basic Metabolic Panel:  Recent Labs  10/23/16 0336 10/24/16 0410  NA 135 135  K 4.1 4.0  CL 95* 94*  CO2 32 34*  GLUCOSE 97 97  BUN 23* 28*  CREATININE 0.58* 0.50*  CALCIUM 8.8* 9.0   Liver Function Tests: No results for input(s): AST, ALT, ALKPHOS, BILITOT, PROT, ALBUMIN in the last 72 hours. No results for input(s): LIPASE, AMYLASE in the last 72 hours. CBC:  Recent Labs  10/23/16 0336 10/24/16 0410  WBC 4.0 7.4  HGB 11.4* 10.6*  HCT 35.1* 32.3*  MCV 82.7 82.3  PLT 150 148*   Cardiac Enzymes:  Recent Labs  10/22/16 2336 10/23/16 0336 10/24/16 0410  TROPONINI 0.04* 0.04* 0.05*   BNP: Invalid input(s): POCBNP D-Dimer: No results for input(s): DDIMER in the last 72 hours. Hemoglobin A1C:  Recent Labs  10/22/16 1758  HGBA1C 5.9*   Fasting Lipid Panel: No results for input(s): CHOL, HDL, LDLCALC, TRIG, CHOLHDL, LDLDIRECT in the last 72 hours. Thyroid Function Tests:  Recent Labs  10/22/16 1758  TSH 0.859   Anemia Panel: No results for input(s): VITAMINB12, FOLATE, FERRITIN, TIBC, IRON, RETICCTPCT in the last 72 hours.   Physical Exam: Blood pressure 112/77, pulse (!) 108, temperature 99.6 F (37.6 C), temperature source Oral, resp. rate 20, height 5\' 9"  (1.753 m), weight 59 kg (130 lb), SpO2 98 %.   Wt Readings from Last 1 Encounters:    10/22/16 59 kg (130 lb)     General appearance: alert and cooperative Resp: clear to auscultation bilaterally Cardio: irregularly irregular rhythm GI: soft, non-tender; bowel sounds normal; no masses,  no organomegaly Extremities: extremities normal, atraumatic, no cyanosis or edema Neurologic: Grossly normal  TELEMETRY: Reviewed telemetry pt in afib with variable vr:  ASSESSMENT AND PLAN:  Active Problems:   Acute on chronic respiratory failure with hypoxia (HCC)-improved somewhat with less sob. CXR yesterday improved. On [place] oxygen overnight.  Conitnue with current plan    Community acquired pneumonia    Elevated troponin-demand ischemia    Atrial tachycardia (HCC)-rate improved. Will change too amiodarone 400 per PEG tube bid and d/c iv amiodarone an follow rate.    Protein-calorie malnutrition, severe    Teodoro Spray, MD, [facility] 10/25/2016 1:37 PM

## 2016-10-25 NOTE — Progress Notes (Signed)
[place] for Warfarin Indication: h/o DVT  Allergies  Allergen Reactions  . Other Other (See Comments)    Dizziness, lightheadedness, Pt states that inhaled medications make him depressed and have negative thoughts.    . Sulfa Antibiotics Rash    Headache    Patient Measurements: Height: 5\' 9"  (175.3 cm) Weight: 130 lb (59 kg) IBW/kg (Calculated) : 70.7  Vital Signs: Temp: 97.6 F (36.4 C) (04/21 0220) Temp Source: Axillary (04/21 0220) BP: 110/71 (04/21 0630) Pulse Rate: 104 (04/21 0630)  Labs:  Recent Labs  10/22/16 1116  10/22/16 2336 10/23/16 0336 10/24/16 0410 10/25/16 0514  HGB 11.0*  --   --  11.4* 10.6*  --   HCT 33.5*  --   --  35.1* 32.3*  --   PLT 148*  --   --  150 148*  --   LABPROT  --   < >  --  17.6* 22.0* 18.4*  INR  --   < >  --  1.43 1.89 1.51  CREATININE 0.60*  --   --  0.58* 0.50*  --   TROPONINI 0.04*  < > 0.04* 0.04* 0.05*  --   < > = values in this interval not displayed.  Estimated Creatinine Clearance: 70.7 mL/min (A) (by C-G formula based on SCr of 0.5 mg/dL (L)).   Medical History: Past Medical History:  Diagnosis Date  . Anxiety   . Arthritis   . Barrett esophagus   . Basal cell carcinoma   . CAD (coronary artery disease)    on 2l home oxygen  . CHF (congestive heart failure) ([place])   . Chronic lower back pain   . COPD (chronic obstructive pulmonary disease) ([place])   . Depression   . DJD (degenerative joint disease)   . DVT (deep venous thrombosis) ([place]) 11/2013; 02/01/2015   on coumadin  . GERD (gastroesophageal reflux disease)   . 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 Heart attack ([place]) 03/20/1995  . History of gout   . Hyperlipidemia   . Hypertension   . Hypothyroidism   . Iron deficiency anemia   . Lumbar spinal stenosis   . Multiple pulmonary nodules   . On home oxygen therapy   . Parkinson's disease ([place])   . Recurrent aspiration  pneumonia ([place])   . Seizures ([place])   . Sleep apnea   . Throat cancer ([place])   . Type II diabetes mellitus [facility])    Assessment: 71 y/o F with a h/o DVT on warfarin on antibiotics for pneumonia. Patient has orders to begin amiodarone for atrial fibrillation.   4/18  INR 1.46,  Warfarin 6mg  4/19  INR 1.43.  No Warfarin 4/20  INR 1.89   Warfarin 3mg  4/21  INR 1.51  Goal of Therapy:  INR 2-3   Plan:  Will increase dose of warfarin to 5mg  daily due to subtherapeutic dosing. Will follow INR daily as patient is at risk of significant INR increase due to Amiodarone.    Olivia Canter, [facility] Clinical Pharmacist 10/25/2016,8:40 AM

## 2016-10-25 NOTE — Progress Notes (Signed)
Patient ID: John Mendoza, male   DOB: 1946-03-29, 71 y.o.   MRN: [id number]   [facility] PROGRESS NOTE  John Mendoza IOX:[id number] DOB: [birth year] DOA: 10/22/2016 PCP: John Hector, MD  HPI/Subjective: Patient is off BiPAP. Transferred to floor 4/21 , breathing okay  Objective: Vitals:   10/25/16 1200 10/25/16 1300  BP: 102/71 112/77  Pulse: (!) 102 (!) 108  Resp: (!) 22 20  Temp: 99.3 F (37.4 C) 99.6 F (37.6 C)    Filed Weights   10/22/16 1118  Weight: 59 kg (130 lb)    ROS: Review of Systems  Constitutional: Negative for chills and fever.  Eyes: Negative for blurred vision.  Respiratory: Positive for cough, shortness of breath and wheezing.   Cardiovascular: Negative for chest pain.  Gastrointestinal: Negative for abdominal pain, constipation, diarrhea, nausea and vomiting.  Genitourinary: Negative for dysuria.  Musculoskeletal: Negative for joint pain.  Neurological: Negative for dizziness and headaches.   Exam: Physical Exam  Constitutional: He is oriented to person, place, and time.  HENT:  Nose: No mucosal edema.  Mouth/Throat: No oropharyngeal exudate or posterior oropharyngeal edema.  Eyes: Conjunctivae, EOM and lids are normal. Pupils are equal, round, and reactive to light.  Neck: No JVD present. Carotid bruit is not present. No edema present. No thyroid mass and no thyromegaly present.  Cardiovascular: S1 normal, S2 normal and normal heart sounds.  Tachycardia present.  Exam reveals no gallop.   No murmur heard. Pulses:      Dorsalis pedis pulses are 2+ on the right side, and 2+ on the left side.  Respiratory: No respiratory distress. He has decreased breath sounds in the right middle field, the right lower field, the left middle field and the left lower field. He has no wheezes. He has rhonchi in the right middle field, the right lower field, the left middle field and the left lower field. He has no rales.  GI: Soft. Bowel sounds are normal.  There is no tenderness.  PEG is intact  Musculoskeletal:       Right ankle: He exhibits no swelling.       Left ankle: He exhibits no swelling.  Lymphadenopathy:    He has no cervical adenopathy.  Neurological: He is alert and oriented to person, place, and time. No cranial nerve deficit.  Resting tremor  Skin: Skin is warm. No rash noted. Nails show no clubbing.  Psychiatric: He has a normal mood and affect.      Data Reviewed: Basic Metabolic Panel:  Recent Labs Lab 10/22/16 1116 10/23/16 0336 10/24/16 0410  NA 135 135 135  K 4.2 4.1 4.0  CL 97* 95* 94*  CO2 29 32 34*  GLUCOSE 98 97 97  BUN 27* 23* 28*  CREATININE 0.60* 0.58* 0.50*  CALCIUM 9.1 8.8* 9.0   CBC:  Recent Labs Lab 10/22/16 1116 10/23/16 0336 10/24/16 0410  WBC 4.7 4.0 7.4  NEUTROABS 3.7  --   --   HGB 11.0* 11.4* 10.6*  HCT 33.5* 35.1* 32.3*  MCV 83.3 82.7 82.3  PLT 148* 150 148*   Cardiac Enzymes:  Recent Labs Lab 10/22/16 1116 10/22/16 1758 10/22/16 2336 10/23/16 0336 10/24/16 0410  TROPONINI 0.04* 0.03* 0.04* 0.04* 0.05*   BNP (last 3 results)  Recent Labs  10/22/16 1116  BNP 601.0*     CBG:  Recent Labs Lab 10/24/16 1220 10/24/16 1605 10/24/16 2217 10/25/16 0751 10/25/16 1201  GLUCAP 117* 139* 132* 152* 134*  Recent Results (from the past 240 hour(s))  Blood culture (routine x 2)     Status: None (Preliminary result)   Collection Time: 10/22/16  2:02 PM  Result Value Ref Range Status   Specimen Description BLOOD R FA  Final   Special Requests   Final    BOTTLES DRAWN AEROBIC AND ANAEROBIC Blood Culture adequate volume   Culture NO GROWTH 3 DAYS  Final   Report Status PENDING  Incomplete  Blood culture (routine x 2)     Status: None (Preliminary result)   Collection Time: 10/22/16  2:02 PM  Result Value Ref Range Status   Specimen Description BLOOD L FA  Final   Special Requests   Final    BOTTLES DRAWN AEROBIC AND ANAEROBIC Blood Culture adequate volume    Culture NO GROWTH 3 DAYS  Final   Report Status PENDING  Incomplete  Culture, expectorated sputum-assessment     Status: None   Collection Time: 10/22/16  3:06 PM  Result Value Ref Range Status   Specimen Description SPU  Final   Special Requests NONE  Final   Sputum evaluation THIS SPECIMEN IS ACCEPTABLE FOR SPUTUM CULTURE  Final   Report Status 10/22/2016 FINAL  Final  Culture, respiratory (NON-Expectorated)     Status: None   Collection Time: 10/22/16  3:06 PM  Result Value Ref Range Status   Specimen Description SPU  Final   Special Requests NONE Reflexed from [id number]  Final   Gram Stain   Final    ABUNDANT WBC PRESENT,BOTH PMN AND MONONUCLEAR ABUNDANT IN CLUSTERS IN PAIRS GRAM POSITIVE COCCI Performed at [facility], [place] [address] [place], [place] [address]    Culture ABUNDANT STAPHYLOCOCCUS AUREUS  Final   Report Status 10/25/2016 FINAL  Final   Organism ID, Bacteria STAPHYLOCOCCUS AUREUS  Final      Susceptibility   Staphylococcus aureus - MIC*    CIPROFLOXACIN 1 SENSITIVE Sensitive     ERYTHROMYCIN RESISTANT Resistant     GENTAMICIN <=0.5 SENSITIVE Sensitive     OXACILLIN <=0.25 SENSITIVE Sensitive     TETRACYCLINE <=1 SENSITIVE Sensitive     VANCOMYCIN 1 SENSITIVE Sensitive     TRIMETH/SULFA <=10 SENSITIVE Sensitive     CLINDAMYCIN RESISTANT Resistant     RIFAMPIN <=0.5 SENSITIVE Sensitive     Inducible Clindamycin POSITIVE Resistant     * ABUNDANT STAPHYLOCOCCUS AUREUS  MRSA PCR Screening     Status: None   Collection Time: 10/22/16  5:55 PM  Result Value Ref Range Status   MRSA by PCR NEGATIVE NEGATIVE Final    Comment:        The GeneXpert MRSA Assay (FDA approved for NASAL specimens only), is one component of a comprehensive MRSA colonization surveillance program. It is not intended to diagnose MRSA infection nor to guide or monitor treatment for MRSA infections.      Studies: Dg Chest Port 1 View  Result Date: 10/24/2016 CLINICAL DATA:   Respiratory failure EXAM: PORTABLE CHEST 1 VIEW COMPARISON:  10/22/2016 FINDINGS: Cardiac shadow is stable. Postsurgical changes are again noted. Significant improved aeration is noted in the bases bilaterally with only minimal residual atelectasis. No new focal abnormality is seen. IMPRESSION: Significant improved aeration in the bases bilaterally. Electronically Signed   By: Inez Catalina M.D.   On: 10/24/2016 07:04    Scheduled Meds: . amiodarone  400 mg Per Tube BID  . ARIPiprazole  5 mg Per Tube BID  . aspirin  81  mg Per Tube QHS  . atorvastatin  40 mg Per Tube QHS  . [START ON 10/26/2016] bisoprolol  10 mg Per Tube Daily  . carbidopa-levodopa  2 tablet Per Tube TID  . chlorhexidine  15 mL Mouth Rinse BID  . clonazePAM  1.5 mg Per Tube QHS  . doxycycline  100 mg Per Tube Q12H  . feeding supplement (ENSURE ENLIVE)  237 mL Per Tube 5 X Daily  . guaiFENesin-dextromethorphan  5 mL Per Tube Q8H  . HYDROcodone-acetaminophen  1 tablet Per Tube QHS  . insulin aspart  0-5 Units Subcutaneous QHS  . insulin aspart  0-9 Units Subcutaneous TID WC  . lamoTRIgine  25 mg Per Tube BID  . levothyroxine  137 mcg Per Tube Daily  . losartan  25 mg Per Tube QHS  . mouth rinse  15 mL Mouth Rinse q12n4p  . PARoxetine  60 mg Per Tube Daily  . sodium chloride flush  3 mL Intravenous Q12H  . sodium chloride flush  3 mL Intravenous Q12H  . warfarin  5 mg Per Tube q1800   Continuous Infusions: . sodium chloride Stopped (10/25/16 1000)    Assessment/Plan:  1. Acute on chronic respiratory failure with hypoxia. 2/2 RML pna Hx Copd. Patient is off BiPAP.  2. Pneumonia right middle lobe with history of aspiration pneumonia. MRSA PCR is negative but sputum culture growing staph. Patient on vancomycin and cefepime, changed to by mouth doxycycline for a total of 3 days. Follow up with pulmonology as needed 3. Atrial fibrillation, borderline troponin.  Demand ischemia. D/ced amio gtt, on amiodarone via PEG  tube 4. Parkinson's disease with resting tremor on Sinemet 5. Type 2 diabetes mellitus on sliding scale only 6. History of DVT on Coumadin. INR 1.51 today, Coumadin management per pharmacy 7. Chronic systolic congestive heart failure with an EF of 20-25%. Watch fluid status closely 8. Hyperlipidemia unspecified on Lipitor  9. Anxiety on clonazepam, abilify  10.  hypothyroidism unspecified on levothyroxine 11. Severe malnutrition. Feeding supplements ensure enlive 12. Generalized weakness PT consult  Code Status:     Code Status Orders        Start     Ordered   10/22/16 1740  Full code  Continuous     10/22/16 1739    Code Status History    Date Active Date Inactive Code Status Order ID Comments User Context   01/04/2016 11:22 PM 01/11/2016  6:13 PM Full Code [id number]  Lance Coon, MD Inpatient   12/11/2015  5:09 PM 12/13/2015  4:53 PM Full Code [id number]  Fritzi Mandes, MD Inpatient   11/29/2015  9:13 PM 12/01/2015  1:58 PM Full Code [id number]  Gladstone Lighter, MD Inpatient   03/06/2015 10:40 AM 03/08/2015  2:22 PM Full Code [id number]  John Hector, MD Inpatient   02/01/2015  5:36 PM 02/08/2015  6:46 PM Full Code [id number]  Samella Parr, NP Inpatient   01/26/2015 10:05 PM 02/01/2015  5:36 PM Full Code [id number]  Demetrios Loll, MD Inpatient    Advance Directive Documentation     Most Recent Value  Type of Advance Directive  Out of facility DNR (pink MOST or yellow form)  Pre-existing out of facility DNR order (yellow form or pink MOST form)  -  "MOST" Form in Place?  -     Family Communication: as per critical care specialist Disposition Plan: TBD  Consultants: - critical care specialist - cardiology  Antibiotics:  Cefepime  Vancomycin  Time spent:  24 minutes  Lyzbeth Genrich, [employer]

## 2016-10-25 NOTE — Progress Notes (Signed)
Much improved. No new complaints. Cognition intact. No distress  Vitals:   10/25/16 0900 10/25/16 1000 10/25/16 1100 10/25/16 1200  BP: 136/87 132/89 (!) 137/110 102/71  Pulse: (!) 115 (!) 106 (!) 104 (!) 102  Resp: (!) 22 18 16  (!) 22  Temp: (!) 96.6 F (35.9 C)   99.3 F (37.4 C)  TempSrc: Axillary   Oral  SpO2: 95% 95% 97% 93%  Weight:      Height:       Severe tremor HEENT WNL No JVD Rhonchi, no wheezes  Reg, no M NABS No edema  BMP Latest Ref Rng & Units 10/24/2016 10/23/2016 10/22/2016  Glucose 65 - 99 mg/dL 97 97 98  BUN 6 - 20 mg/dL 28(H) 23(H) 27(H)  Creatinine 0.61 - 1.24 mg/dL 0.50(L) 0.58(L) 0.60(L)  Sodium 135 - 145 mmol/L 135 135 135  Potassium 3.5 - 5.1 mmol/L 4.0 4.1 4.2  Chloride 101 - 111 mmol/L 94(L) 95(L) 97(L)  CO2 22 - 32 mmol/L 34(H) 32 29  Calcium 8.9 - 10.3 mg/dL 9.0 8.8(L) 9.1   CBC Latest Ref Rng & Units 10/24/2016 10/23/2016 10/22/2016  WBC 3.8 - 10.6 K/uL 7.4 4.0 4.7  Hemoglobin 13.0 - 18.0 g/dL 10.6(L) 11.4(L) 11.0(L)  Hematocrit 40.0 - 52.0 % 32.3(L) 35.1(L) 33.5(L)  Platelets 150 - 440 K/uL 148(L) 150 148(L)   CXR 04/20: Cardiomegaly, RML atelectasis resolved, mild diffuse interstitial prominence  IMPRESSION: Acute respiratory failure - resolving Atelectasis, resolved Doubt PNA Staph on sputum cx - will treat as bronchitis AF > NSR on amiodarone per Cards  PLAN/REC: Transfer to telemetry Change abx to doxycycline for 3 more days Change nebs to PRN only PCCM will sign off. Please call if we can be of further assistance  Merton Border, MD PCCM service Mobile [phone] Pager [phone] 10/25/2016

## 2016-10-25 NOTE — Progress Notes (Signed)
Pharmacy Antibiotic Note  John Mendoza is a 71 y.o. male admitted on 10/22/2016 with pneumonia.  Pharmacy has been consulted for vancomycin dosing.   Plan: Will increase vancomycin dosing to 750 mg iv q 8 hours and recheck a trough with the 4 dose. Goal trough 15-20 mcg/ml. F/U culture results.   Pt also receiving cefepime 1 g IV q 8 hours  Height: 5\' 9"  (175.3 cm) Weight: 130 lb (59 kg) IBW/kg (Calculated) : 70.7  Temp (24hrs), Avg:99.1 F (37.3 C), Min:96.6 F (35.9 C), Max:101.2 F (38.4 C)   Recent Labs Lab 10/22/16 1116 10/22/16 1609 10/22/16 1758 10/23/16 0336 10/24/16 0410 10/24/16 1243 10/25/16 2123  WBC 4.7  --   --  4.0 7.4  --   --   CREATININE 0.60*  --   --  0.58* 0.50*  --   --   LATICACIDVEN  --  1.6 1.6  --   --   --   --   VANCOTROUGH  --   --   --   --   --  7* 8*    Estimated Creatinine Clearance: 70.7 mL/min (A) (by C-G formula based on SCr of 0.5 mg/dL (L)).    Allergies  Allergen Reactions  . Other Other (See Comments)    Dizziness, lightheadedness, Pt states that inhaled medications make him depressed and have negative thoughts.    . Sulfa Antibiotics Rash    Headache    Antimicrobials this admission: azithromycin 4/18 x 1 ceftriaxone 4/18 x 1 Cefepime 4/18 >> Vancomycin 4/18 >>  Dose adjustments this admission:  4/21 PM vanc trough 8. Vanc d/c by MD tioday.  Microbiology results: 4/18 BCx: NGTD 4/18 Sputum: S aureus 4/18 MRSA PCR: negative  Thank you for allowing pharmacy to be a part of this patient's care.  Ulice Dash, PharmD Clinical Pharmacist  10/25/2016 10:18 PM

## 2016-10-26 ENCOUNTER — Inpatient Hospital Stay: Payer: Medicare Other

## 2016-10-26 LAB — GLUCOSE, CAPILLARY
GLUCOSE-CAPILLARY: 128 mg/dL — AB (ref 65–99)
Glucose-Capillary: 163 mg/dL — ABNORMAL HIGH (ref 65–99)
Glucose-Capillary: 95 mg/dL (ref 65–99)

## 2016-10-26 LAB — PROTIME-INR
INR: 1.55
PROTHROMBIN TIME: 18.7 s — AB (ref 11.4–15.2)

## 2016-10-26 MED ORDER — WARFARIN SODIUM 6 MG PO TABS
6.0000 mg | ORAL_TABLET | Freq: Every day | ORAL | Status: DC
  Administered 2016-10-26: 6 mg
  Filled 2016-10-26: qty 1

## 2016-10-26 MED ORDER — WARFARIN - PHARMACIST DOSING INPATIENT
Freq: Every day | Status: DC
  Administered 2016-10-26: 20:00:00

## 2016-10-26 NOTE — Progress Notes (Signed)
Patient ID: John Mendoza, male   DOB: Apr 22, 1946, 71 y.o.   MRN: [id number]   [facility] PROGRESS NOTE  John Mendoza:[id number] DOB: 11/21/1945 DOA: 10/22/2016 PCP: Adin Hector, MD  HPI/Subjective: Patient is off BiPAP. Transferred to floor 4/21 , breathing okay, feeling weak and tired. No shortness of breath  Objective: Vitals:   10/26/16 0800 10/26/16 1156  BP: 117/72 (!) 143/92  Pulse: 97 94  Resp: 16 20  Temp: 97.9 F (36.6 C) 98.9 F (37.2 C)    Filed Weights   10/22/16 1118  Weight: 59 kg (130 lb)    ROS: Review of Systems  Constitutional: Negative for chills and fever.  Eyes: Negative for blurred vision.  Respiratory: Positive for cough, shortness of breath and wheezing.   Cardiovascular: Negative for chest pain.  Gastrointestinal: Negative for abdominal pain, constipation, diarrhea, nausea and vomiting.  Genitourinary: Negative for dysuria.  Musculoskeletal: Negative for joint pain.  Neurological: Negative for dizziness and headaches.   Exam: Physical Exam  Constitutional: He is oriented to person, place, and time.  HENT:  Nose: No mucosal edema.  Mouth/Throat: No oropharyngeal exudate or posterior oropharyngeal edema.  Eyes: Conjunctivae, EOM and lids are normal. Pupils are equal, round, and reactive to light.  Neck: No JVD present. Carotid bruit is not present. No edema present. No thyroid mass and no thyromegaly present.  Cardiovascular: S1 normal, S2 normal and normal heart sounds.  Tachycardia present.  Exam reveals no gallop.   No murmur heard. Pulses:      Dorsalis pedis pulses are 2+ on the right side, and 2+ on the left side.  Respiratory: No respiratory distress. He has decreased breath sounds in the right middle field, the right lower field, the left middle field and the left lower field. He has no wheezes. He has rhonchi in the right middle field, the right lower field, the left middle field and the left lower field. He has no  rales.  GI: Soft. Bowel sounds are normal. There is no tenderness.  PEG is intact  Musculoskeletal:       Right ankle: He exhibits no swelling.       Left ankle: He exhibits no swelling.  Lymphadenopathy:    He has no cervical adenopathy.  Neurological: He is alert and oriented to person, place, and time. No cranial nerve deficit.  Resting tremor  Skin: Skin is warm. No rash noted. Nails show no clubbing.  Psychiatric: He has a normal mood and affect.      Data Reviewed: Basic Metabolic Panel:  Recent Labs Lab 10/22/16 1116 10/23/16 0336 10/24/16 0410  NA 135 135 135  K 4.2 4.1 4.0  CL 97* 95* 94*  CO2 29 32 34*  GLUCOSE 98 97 97  BUN 27* 23* 28*  CREATININE 0.60* 0.58* 0.50*  CALCIUM 9.1 8.8* 9.0   CBC:  Recent Labs Lab 10/22/16 1116 10/23/16 0336 10/24/16 0410  WBC 4.7 4.0 7.4  NEUTROABS 3.7  --   --   HGB 11.0* 11.4* 10.6*  HCT 33.5* 35.1* 32.3*  MCV 83.3 82.7 82.3  PLT 148* 150 148*   Cardiac Enzymes:  Recent Labs Lab 10/22/16 1116 10/22/16 1758 10/22/16 2336 10/23/16 0336 10/24/16 0410  TROPONINI 0.04* 0.03* 0.04* 0.04* 0.05*   BNP (last 3 results)  Recent Labs  10/22/16 1116  BNP 601.0*     CBG:  Recent Labs Lab 10/25/16 0751 10/25/16 1201 10/25/16 1703 10/25/16 2112 10/26/16 1148  GLUCAP 152*  134* 104* 138* 163*    Recent Results (from the past 240 hour(s))  Blood culture (routine x 2)     Status: None (Preliminary result)   Collection Time: 10/22/16  2:02 PM  Result Value Ref Range Status   Specimen Description BLOOD R FA  Final   Special Requests   Final    BOTTLES DRAWN AEROBIC AND ANAEROBIC Blood Culture adequate volume   Culture NO GROWTH 4 DAYS  Final   Report Status PENDING  Incomplete  Blood culture (routine x 2)     Status: None (Preliminary result)   Collection Time: 10/22/16  2:02 PM  Result Value Ref Range Status   Specimen Description BLOOD L FA  Final   Special Requests   Final    BOTTLES DRAWN AEROBIC  AND ANAEROBIC Blood Culture adequate volume   Culture NO GROWTH 4 DAYS  Final   Report Status PENDING  Incomplete  Culture, expectorated sputum-assessment     Status: None   Collection Time: 10/22/16  3:06 PM  Result Value Ref Range Status   Specimen Description SPU  Final   Special Requests NONE  Final   Sputum evaluation THIS SPECIMEN IS ACCEPTABLE FOR SPUTUM CULTURE  Final   Report Status 10/22/2016 FINAL  Final  Culture, respiratory (NON-Expectorated)     Status: None   Collection Time: 10/22/16  3:06 PM  Result Value Ref Range Status   Specimen Description SPU  Final   Special Requests NONE Reflexed from [id number]  Final   Gram Stain   Final    ABUNDANT WBC PRESENT,BOTH PMN AND MONONUCLEAR ABUNDANT IN CLUSTERS IN PAIRS GRAM POSITIVE COCCI Performed at [facility], [place] [address], [place], [place] [address]    Culture ABUNDANT STAPHYLOCOCCUS AUREUS  Final   Report Status 10/25/2016 FINAL  Final   Organism ID, Bacteria STAPHYLOCOCCUS AUREUS  Final      Susceptibility   Staphylococcus aureus - MIC*    CIPROFLOXACIN 1 SENSITIVE Sensitive     ERYTHROMYCIN RESISTANT Resistant     GENTAMICIN <=0.5 SENSITIVE Sensitive     OXACILLIN <=0.25 SENSITIVE Sensitive     TETRACYCLINE <=1 SENSITIVE Sensitive     VANCOMYCIN 1 SENSITIVE Sensitive     TRIMETH/SULFA <=10 SENSITIVE Sensitive     CLINDAMYCIN RESISTANT Resistant     RIFAMPIN <=0.5 SENSITIVE Sensitive     Inducible Clindamycin POSITIVE Resistant     * ABUNDANT STAPHYLOCOCCUS AUREUS  MRSA PCR Screening     Status: None   Collection Time: 10/22/16  5:55 PM  Result Value Ref Range Status   MRSA by PCR NEGATIVE NEGATIVE Final    Comment:        The GeneXpert MRSA Assay (FDA approved for NASAL specimens only), is one component of a comprehensive MRSA colonization surveillance program. It is not intended to diagnose MRSA infection nor to guide or monitor treatment for MRSA infections.      Studies: Dg Chest 2  View  Result Date: 10/26/2016 CLINICAL DATA:  Productive cough.  Pneumonia diagnosed last week. EXAM: CHEST  2 VIEW COMPARISON:  10/24/2016. 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 Increased left basilar airspace opacity. No significant change in mild airspace opacity at the medial right lung base. Small bilateral pleural effusions. Mild increase in prominence of the interstitial markings. Diffuse osteopenia. Thoracic spine degenerative changes. IMPRESSION: 1. Worsening left basilar atelectasis or pneumonia. 2. Stable pneumonia or patchy atelectasis at the medial right lung base. 3. Small bilateral  pleural effusions, cardiomegaly and chronic interstitial lung disease with probable mild superimposed interstitial pulmonary edema. Electronically Signed   By: Claudie Revering M.D.   On: 10/26/2016 11:11    Scheduled Meds: . amiodarone  400 mg Per Tube BID  . ARIPiprazole  5 mg Per Tube BID  . aspirin  81 mg Per Tube QHS  . atorvastatin  40 mg Per Tube QHS  . bisoprolol  10 mg Per Tube Daily  . carbidopa-levodopa  2 tablet Per Tube TID  . chlorhexidine  15 mL Mouth Rinse BID  . clonazePAM  1.5 mg Per Tube QHS  . doxycycline  100 mg Per Tube Q12H  . feeding supplement (ENSURE ENLIVE)  237 mL Per Tube 5 X Daily  . guaiFENesin-dextromethorphan  5 mL Per Tube Q8H  . HYDROcodone-acetaminophen  1 tablet Per Tube QHS  . insulin aspart  0-5 Units Subcutaneous QHS  . insulin aspart  0-9 Units Subcutaneous TID WC  . lamoTRIgine  25 mg Per Tube BID  . levothyroxine  137 mcg Per Tube Daily  . losartan  25 mg Per Tube QHS  . mouth rinse  15 mL Mouth Rinse q12n4p  . PARoxetine  60 mg Per Tube Daily  . sodium chloride flush  3 mL Intravenous Q12H  . sodium chloride flush  3 mL Intravenous Q12H  . warfarin  6 mg Per Tube q1800  . Warfarin - Pharmacist Dosing Inpatient   Does not apply q1800   Continuous Infusions: . sodium chloride Stopped (10/25/16 1000)     Assessment/Plan:  1. Acute on chronic respiratory failure with hypoxia. 2/2 RML pna Hx Copd. Patient is off BiPAP.  2. Pneumonia right middle lobe with history of aspiration pneumonia. MRSA PCR is negative but sputum culture growing staphMSSA Patient was on vancomycin and cefepime, changed to by mouth doxycycline for a total of 3 days. Follow up with pulmonology as needed. Repeat chest x-ray with worsening pneumonia versus atelectasis will continue close monitoring 3. Atrial fibrillation, borderline troponin.  Demand ischemia. D/ced amio gtt, on amiodarone via PEG tube 4. Parkinson's disease with resting tremor on Sinemet 5. Type 2 diabetes mellitus on sliding scale only 6. History of DVT on Coumadin. INR 1.51 today, Coumadin management per pharmacy 7. Chronic systolic congestive heart failure with an EF of 20-25%. Watch fluid status closely 8. Hyperlipidemia unspecified on Lipitor  9. Anxiety on clonazepam, abilify  10.  hypothyroidism unspecified on levothyroxine 11. Severe malnutrition. Feeding supplements ensure enlive 12. Generalized weakness PT consult pending 13. Anticipate discharge tomorrow after PT evaluation if clinically stable  Code Status:     Code Status Orders        Start     Ordered   10/22/16 1740  Full code  Continuous     10/22/16 1739    Code Status History    Date Active Date Inactive Code Status Order ID Comments User Context   01/04/2016 11:22 PM 01/11/2016  6:13 PM Full Code [id number]  Lance Coon, MD Inpatient   12/11/2015  5:09 PM 12/13/2015  4:53 PM Full Code [id number]  Fritzi Mandes, MD Inpatient   11/29/2015  9:13 PM 12/01/2015  1:58 PM Full Code [id number]  Gladstone Lighter, MD Inpatient   03/06/2015 10:40 AM 03/08/2015  2:22 PM Full Code [id number]  Adin Hector, MD Inpatient   02/01/2015  5:36 PM 02/08/2015  6:46 PM Full Code [id number]  Samella Parr, NP Inpatient   01/26/2015 10:05 PM 02/01/2015  5:36  PM Full Code [id number]  Demetrios Loll, MD Inpatient     Advance Directive Documentation     Most Recent Value  Type of Advance Directive  Out of facility DNR (pink MOST or yellow form)  Pre-existing out of facility DNR order (yellow form or pink MOST form)  -  "MOST" Form in Place?  -     Family Communication: as per critical care specialist Disposition Plan: TBD  Consultants: - critical care specialist - cardiology  Antibiotics:  Cefepime  Vancomycin  Time spent: 24 minutes  Tiras Bianchini, [employer]

## 2016-10-26 NOTE — Progress Notes (Signed)
[place] for Warfarin Indication: h/o DVT  Allergies  Allergen Reactions  . Other Other (See Comments)    Dizziness, lightheadedness, Pt states that inhaled medications make him depressed and have negative thoughts.    . Sulfa Antibiotics Rash    Headache    Patient Measurements: Height: 5\' 9"  (175.3 cm) Weight: 130 lb (59 kg) IBW/kg (Calculated) : 70.7  Vital Signs: Temp: 97.4 F (36.3 C) (04/22 0301) Temp Source: Oral (04/22 0301) BP: 115/71 (04/22 0301) Pulse Rate: 93 (04/22 0301)  Labs:  Recent Labs  10/24/16 0410 10/25/16 0514 10/26/16 0459  HGB 10.6*  --   --   HCT 32.3*  --   --   PLT 148*  --   --   LABPROT 22.0* 18.4* 18.7*  INR 1.89 1.51 1.55  CREATININE 0.50*  --   --   TROPONINI 0.05*  --   --     Estimated Creatinine Clearance: 70.7 mL/min (A) (by C-G formula based on SCr of 0.5 mg/dL (L)).   Medical History: Past Medical History:  Diagnosis Date  . Anxiety   . Arthritis   . Barrett esophagus   . Basal cell carcinoma   . CAD (coronary artery disease)    on 2l home oxygen  . CHF (congestive heart failure) ([place])   . Chronic lower back pain   . COPD (chronic obstructive pulmonary disease) ([place])   . Depression   . DJD (degenerative joint disease)   . DVT (deep venous thrombosis) ([place]) 11/2013; 02/01/2015   on coumadin  . GERD (gastroesophageal reflux disease)   . 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 Heart attack ([place]) 03/20/1995  . History of gout   . Hyperlipidemia   . Hypertension   . Hypothyroidism   . Iron deficiency anemia   . Lumbar spinal stenosis   . Multiple pulmonary nodules   . On home oxygen therapy   . Parkinson's disease ([place])   . Recurrent aspiration pneumonia ([place])   . Seizures ([place])   . Sleep apnea   . Throat cancer ([place])   . Type II diabetes mellitus [facility])    Assessment: 71 y/o F with a h/o DVT on warfarin on antibiotics for  pneumonia. Patient has orders to begin amiodarone for atrial fibrillation.   4/18  INR 1.46,  Warfarin 6mg  4/19  INR 1.43.  No Warfarin 4/20  INR 1.89   Warfarin 3mg  4/21  INR 1.51   Warfarin 5mg  4/22  INR 1.55  Goal of Therapy:  INR 2-3   Plan:  Will increase dose of warfarin to 6mg  daily due to subtherapeutic dosing. Will follow INR daily as patient is at risk of significant INR increase due to Amiodarone.    Olivia Canter, [facility] Clinical Pharmacist 10/26/2016,8:16 AM

## 2016-10-26 NOTE — Progress Notes (Signed)
PT Cancellation Note  Patient Details Name: John Mendoza MRN: [id number] DOB: Feb 12, 1946   Cancelled Treatment:    Reason Eval/Treat Not Completed: Patient declined, no reason specified Patient reported that he had been out of bed and walked several times today; he requested that we check back tomorrow for his PT evaluation.    Bertram Denver, PT, DPT, CWCE 10/26/2016, 2:33 PM

## 2016-10-27 LAB — CBC
HEMATOCRIT: 31.2 % — AB (ref 40.0–52.0)
Hemoglobin: 10.1 g/dL — ABNORMAL LOW (ref 13.0–18.0)
MCH: 26.8 pg (ref 26.0–34.0)
MCHC: 32.5 g/dL (ref 32.0–36.0)
MCV: 82.6 fL (ref 80.0–100.0)
Platelets: 163 10*3/uL (ref 150–440)
RBC: 3.77 MIL/uL — ABNORMAL LOW (ref 4.40–5.90)
RDW: 16.7 % — AB (ref 11.5–14.5)
WBC: 4.7 10*3/uL (ref 3.8–10.6)

## 2016-10-27 LAB — CULTURE, BLOOD (ROUTINE X 2)
Culture: NO GROWTH
Culture: NO GROWTH
SPECIAL REQUESTS: ADEQUATE
Special Requests: ADEQUATE

## 2016-10-27 LAB — BASIC METABOLIC PANEL
ANION GAP: 6 (ref 5–15)
BUN: 28 mg/dL — ABNORMAL HIGH (ref 6–20)
CO2: 36 mmol/L — AB (ref 22–32)
Calcium: 8.8 mg/dL — ABNORMAL LOW (ref 8.9–10.3)
Chloride: 96 mmol/L — ABNORMAL LOW (ref 101–111)
Creatinine, Ser: 0.47 mg/dL — ABNORMAL LOW (ref 0.61–1.24)
GFR calc Af Amer: >60 mL/min (ref >60)
GFR calc non Af Amer: >60 mL/min (ref >60)
Glucose, Bld: 88 mg/dL (ref 65–99)
POTASSIUM: 4 mmol/L (ref 3.5–5.1)
Sodium: 138 mmol/L (ref 135–145)

## 2016-10-27 LAB — GLUCOSE, CAPILLARY
GLUCOSE-CAPILLARY: 151 mg/dL — AB (ref 65–99)
GLUCOSE-CAPILLARY: 143 mg/dL — AB (ref 65–99)

## 2016-10-27 LAB — PROTIME-INR
INR: 1.55
Prothrombin Time: 18.7 seconds — ABNORMAL HIGH (ref 11.4–15.2)

## 2016-10-27 MED ORDER — BISOPROLOL FUMARATE 10 MG PO TABS: 10.0000 mg | ORAL_TABLET | Freq: Every day | ORAL | Status: AC

## 2016-10-27 MED ORDER — WARFARIN SODIUM 6 MG PO TABS: 6.0000 mg | ORAL_TABLET | Freq: Every day | ORAL | 0 refills | Status: AC

## 2016-10-27 MED ORDER — CARBIDOPA-LEVODOPA 25-250 MG PO TABS: 2.0000 | ORAL_TABLET | Freq: Three times a day (TID) | ORAL | Status: AC

## 2016-10-27 MED ORDER — ENSURE ENLIVE PO LIQD: 237.0000 mL | Freq: Every day | ORAL | 60 refills | Status: AC

## 2016-10-27 MED ORDER — ASPIRIN 81 MG PO CHEW: 81.0000 mg | CHEWABLE_TABLET | Freq: Every day | ORAL | Status: AC

## 2016-10-27 MED ORDER — AMIODARONE HCL 200 MG PO TABS: 400.0000 mg | ORAL_TABLET | Freq: Two times a day (BID) | ORAL | 0 refills | Status: AC

## 2016-10-27 MED ORDER — ATORVASTATIN CALCIUM 40 MG PO TABS: 40.0000 mg | ORAL_TABLET | Freq: Every day | ORAL | Status: AC

## 2016-10-27 MED ORDER — LAMOTRIGINE 25 MG PO TABS: 25.0000 mg | ORAL_TABLET | Freq: Two times a day (BID) | ORAL | Status: AC

## 2016-10-27 MED ORDER — CLONAZEPAM 1 MG PO TABS: 1.5000 mg | ORAL_TABLET | Freq: Every day | ORAL | 0 refills | Status: AC

## 2016-10-27 MED ORDER — ACETAMINOPHEN 500 MG PO TABS: 500.0000 mg | ORAL_TABLET | Freq: Four times a day (QID) | ORAL | 0 refills | Status: AC | PRN

## 2016-10-27 MED ORDER — HYDROCODONE-ACETAMINOPHEN 5-325 MG PO TABS: 1.0000 | ORAL_TABLET | Freq: Every day | ORAL | 0 refills | Status: AC

## 2016-10-27 MED ORDER — METFORMIN HCL 500 MG PO TABS: 500.0000 mg | ORAL_TABLET | Freq: Two times a day (BID) | ORAL | Status: AC

## 2016-10-27 MED ORDER — GUAIFENESIN-DM 100-10 MG/5ML PO SYRP: 5.0000 mL | ORAL_SOLUTION | Freq: Three times a day (TID) | ORAL | 0 refills | Status: AC

## 2016-10-27 MED ORDER — LEVOTHYROXINE SODIUM 137 MCG PO TABS: 137.0000 ug | ORAL_TABLET | Freq: Every day | ORAL | Status: AC

## 2016-10-27 MED ORDER — POLYETHYLENE GLYCOL 3350 17 G PO PACK: 17.0000 g | PACK | Freq: Every day | ORAL | 0 refills | Status: AC | PRN

## 2016-10-27 MED ORDER — ARIPIPRAZOLE 5 MG PO TABS: 5.0000 mg | ORAL_TABLET | Freq: Two times a day (BID) | ORAL | Status: AC

## 2016-10-27 MED ORDER — DOXYCYCLINE HYCLATE 100 MG PO TABS: 100.0000 mg | ORAL_TABLET | Freq: Two times a day (BID) | ORAL | 0 refills | Status: AC

## 2016-10-27 NOTE — Care Management Important Message (Signed)
Important Message  Patient Details  Name: John Mendoza MRN: [id number] Date of Birth: 03/25/1946   Medicare Important Message Given:  Yes Signed IM notice given    Katrina Stack, RN 10/27/2016, 1:43 PM

## 2016-10-27 NOTE — Progress Notes (Addendum)
Patient has a raised red bump on right forearm. It is hot to touch. Dr. Margaretmary Eddy notified to come and look at it. Patient is going home on doxycycline.

## 2016-10-27 NOTE — Care Management (Signed)
Patient is in agreement with palliative consult at home.  Referral called to to [facility].  Updated Advanced on discharge and adding PT, Aide and SW to the nursing referral.  Attending will add order for INR.  Patient has home suction machine.  His 02 is through [employer].  He says that his wife will transport home and there will not be any issues getting into the house.

## 2016-10-27 NOTE — Progress Notes (Signed)
[place] for Warfarin Indication: h/o DVT  Allergies  Allergen Reactions  . Other Other (See Comments)    Dizziness, lightheadedness, Pt states that inhaled medications make him depressed and have negative thoughts.    . Sulfa Antibiotics Rash    Headache    Patient Measurements: Height: 5\' 9"  (175.3 cm) Weight: 130 lb (59 kg) IBW/kg (Calculated) : 70.7  Vital Signs: Temp: 97.8 F (36.6 C) (04/23 0457) Temp Source: Oral (04/23 0457) BP: 131/82 (04/23 0457) Pulse Rate: 95 (04/23 0457)  Labs:  Recent Labs  10/25/16 0514 10/26/16 0459 10/27/16 0410  HGB  --   --  10.1*  HCT  --   --  31.2*  PLT  --   --  163  LABPROT 18.4* 18.7* 18.7*  INR 1.51 1.55 1.55  CREATININE  --   --  0.47*    Estimated Creatinine Clearance: 70.7 mL/min (A) (by C-G formula based on SCr of 0.47 mg/dL (L)).   Medical History: Past Medical History:  Diagnosis Date  . Anxiety   . Arthritis   . Barrett esophagus   . Basal cell carcinoma   . CAD (coronary artery disease)    on 2l home oxygen  . CHF (congestive heart failure) ([place])   . Chronic lower back pain   . COPD (chronic obstructive pulmonary disease) ([place])   . Depression   . DJD (degenerative joint disease)   . DVT (deep venous thrombosis) ([place]) 11/2013; 02/01/2015   on coumadin  . GERD (gastroesophageal reflux disease)   . 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 Heart attack ([place]) 03/20/1995  . History of gout   . Hyperlipidemia   . Hypertension   . Hypothyroidism   . Iron deficiency anemia   . Lumbar spinal stenosis   . Multiple pulmonary nodules   . On home oxygen therapy   . Parkinson's disease ([place])   . Recurrent aspiration pneumonia ([place])   . Seizures ([place])   . Sleep apnea   . Throat cancer ([place])   . Type II diabetes mellitus [facility])    Assessment: 71 y/o F with a h/o DVT on warfarin on antibiotics for pneumonia. Patient has orders to  begin amiodarone for atrial fibrillation.   4/18  INR 1.46,  Warfarin 6mg  4/19  INR 1.43.  No Warfarin 4/20  INR 1.89   Warfarin 3mg  4/21  INR 1.51   Warfarin 5mg  4/22  INR 1.55; warfarin 6 mg  4/24  INR 1.55;   Goal of Therapy:  INR 2-3   Plan:  Will give warfarin 6 mg PO x 1  due to subtherapeutic dosing. Will follow INR daily as patient is at risk of significant INR increase due to Amiodarone.    Larene Beach, [facility] Clinical Pharmacist 10/27/2016,7:49 AM

## 2016-10-27 NOTE — Evaluation (Signed)
Physical Therapy Evaluation Patient Details Name: John Mendoza MRN: [id number] DOB: 01-30-1946 Today's Date: 10/27/2016   History of Present Illness  Lowen Barringer is a 71yo white male who [facility] on [facility]18 after 4-5d of cough and sputum. The patient is admitted for CAP. PMH: Parkinson's Disease, Tonsillar CA s/p surgery/radiation, G-tueb placement, DM, hypothyroidism, COPD. At baseline the patien tlive with wife, requiring assistance for some ADL, performs mostly houshold distance AMB with intermittent SPC use PRN, and intermittent AMB in community 1-2x weekly. The patient denies falls in the last 3 months.   Clinical Impression  Pt admitted with above diagnosis. Pt currently with functional limitations due to the deficits listed below (see "PT Problem List"). Upon entry, the patient is received semirecumbent in bed, no family/caregiver present. The pt is awake and agreeable to participate. No acute distress noted at this time. SpO2 stable on 2L/min during eval, however HR irregular per tele, rate controlled under 105bpm throughout. The pt is alert and oriented x3, pleasant, conversational, and following simple and multi-step commands consistently. Pt received on and remaining on 2L O2 throughout evaluation, which is his baseline flowrate, with noted saturation of >89% throughout. Functional mobility assessment demonstrates mild-moderate weakness, the pt now requiring additional time and effort for bed mobility, transfers, and gait, however the patient is able to perform these at modified independent level to minGuard Assist. The patient reports to feel abotu 15% weaker than his baseline. The patient is at high risk for falls as evidence by gait speed <1.19m/s, forward reach <5", and multiple LOB demonstrated throughout session. Pt will benefit from skilled PT intervention to increase independence and safety with basic mobility in preparation for discharge to the venue listed below.       Follow Up  Recommendations Home health PT    Equipment Recommendations  None recommended by PT    Recommendations for Other Services       Precautions / Restrictions Precautions Precautions: Fall Restrictions Weight Bearing Restrictions: No      Mobility  Bed Mobility Overal bed mobility: Modified Independent             General bed mobility comments: Requires additional time and effort.   Transfers Overall transfer level: Modified independent Equipment used: None             General transfer comment: Requires additional time and effort.   Ambulation/Gait Ambulation/Gait assistance: Min guard Ambulation Distance (Feet): 300 Feet Assistive device: None     Gait velocity interpretation: <1.8 ft/sec, indicative of risk for recurrent falls General Gait Details: Kyphotic and flexed, assistance with O2 tank, no AD, no LOB, slow steady adn confident.   Stairs            Wheelchair Mobility    Modified Rankin (Stroke Patients Only)       Balance Overall balance assessment: History of Falls;Modified Independent (single LOB while urinating, self corrected with use of wall)                                           Pertinent Vitals/Pain Pain Assessment: No/denies pain    Home Living Family/patient expects to be discharged to:: Private residence Living Arrangements: Spouse/significant other Available Help at Discharge: Family;Available PRN/intermittently Type of Home: Mobile home Home Access: Stairs to enter Entrance Stairs-Rails: Right;Left;Can reach both Entrance Stairs-Number of Steps: 4 Home Layout: One level  Home Equipment: Kasandra Knudsen - single point;Walker - 2 wheels;Bedside commode;Shower seat;Shower seat - built in;Walker - 4 wheels      Prior Function Level of Independence: Needs assistance      ADL's / Homemaking Assistance Needed: Needs some assistance with dressing and bathing.   Comments: Intermittent use of SPC "when back  hurts".     Hand Dominance        Extremity/Trunk Assessment        Lower Extremity Assessment Lower Extremity Assessment: Generalized weakness;Overall WFL for tasks assessed       Communication   Communication: Expressive difficulties (hypophonia and slowed speech typical of parkinsonism. )  Cognition Arousal/Alertness: Awake/alert Behavior During Therapy: WFL for tasks assessed/performed;Flat affect Overall Cognitive Status: Within Functional Limits for tasks assessed                                        General Comments      Exercises     Assessment/Plan    PT Assessment Patient needs continued PT services  PT Problem List Decreased strength;Decreased activity tolerance;Decreased balance;Decreased mobility       PT Treatment Interventions Functional mobility training;Therapeutic activities;Therapeutic exercise;Balance training;Patient/family education    PT Goals (Current goals can be found in the Care Plan section)  Acute Rehab PT Goals Patient Stated Goal: Return to home and regain strength  PT Goal Formulation: With patient Time For Goal Achievement: 11/10/16 Potential to Achieve Goals: Good    Frequency Min 2X/week   Barriers to discharge        Co-evaluation               End of Session Equipment Utilized During Treatment: Gait belt;Oxygen Activity Tolerance: Patient tolerated treatment well Patient left: in chair;with call bell/phone within reach Nurse Communication: Mobility status PT Visit Diagnosis: Other abnormalities of gait and mobility (R26.89);Muscle weakness (generalized) (M62.81);Difficulty in walking, not elsewhere classified (R26.2)    Time: 1655-3748 PT Time Calculation (min) (ACUTE ONLY): 32 min   Charges:   PT Evaluation $PT Eval Moderate Complexity: 1 Procedure PT Treatments $Therapeutic Activity: 8-22 mins   PT G Codes:      9:28 AM, Oct 29, 2016 Etta Grandchild, PT, DPT Physical Therapist - [place] [phone] ([place])  [phone] (mobile)       Kenlynn Houde C 10/29/2016, 9:25 AM

## 2016-10-27 NOTE — Progress Notes (Addendum)
New referral for Home Palliative services received from [place].Planned discharge today. Information faxed to referral. Thank you. Flo Shanks RN, BSN, [facility] [place], hospital Liaison [phone] c

## 2016-10-27 NOTE — Discharge Instructions (Signed)
Follow-up with primary care physician in 5-7 days Follow-up with neurology for Parkinson's disease in 1-2 weeks Continue 2 L of oxygen via nasal cannula Home health repeat PT/INR on April 25 and results need to be faxed over to primary care physician for further management of Coumadin      Community-Acquired Pneumonia, Adult Pneumonia is an infection of the lungs. There are different types of pneumonia. One type can develop while a person is in a hospital. A different type, called community-acquired pneumonia, develops in people who are not, or have not recently been, in the hospital or other health care facility. What are the causes? Pneumonia may be caused by bacteria, viruses, or funguses. Community-acquired pneumonia is often caused by Streptococcus pneumonia bacteria. These bacteria are often passed from one person to another by breathing in droplets from the cough or sneeze of an infected person. What increases the risk? The condition is more likely to develop in:  People who havechronic diseases, such as chronic obstructive pulmonary disease (COPD), asthma, congestive heart failure, cystic fibrosis, diabetes, or kidney disease.  People who haveearly-stage or late-stage HIV.  People who havesickle cell disease.  People who havehad their spleen removed (splenectomy).  People who havepoor Human resources officer.  People who havemedical conditions that increase the risk of breathing in (aspirating) secretions their own mouth and nose.  People who havea weakened immune system (immunocompromised).  People who smoke.  People whotravel to areas where pneumonia-causing germs commonly exist.  People whoare around animal habitats or animals that have pneumonia-causing germs, including birds, bats, rabbits, cats, and farm animals. What are the signs or symptoms? Symptoms of this condition include:  Adry cough.  A wet (productive) cough.  Fever.  Sweating.  Chest pain,  especially when breathing deeply or coughing.  Rapid breathing or difficulty breathing.  Shortness of breath.  Shaking chills.  Fatigue.  Muscle aches. How is this diagnosed? Your health care provider will take a medical history and perform a physical exam. You may also have other tests, including:  Imaging studies of your chest, including X-rays.  Tests to check your blood oxygen level and other blood gases.  Other tests on blood, mucus (sputum), fluid around your lungs (pleural fluid), and urine. If your pneumonia is severe, other tests may be done to identify the specific cause of your illness. How is this treated? The type of treatment that you receive depends on many factors, such as the cause of your pneumonia, the medicines you take, and other medical conditions that you have. For most adults, treatment and recovery from pneumonia may occur at home. In some cases, treatment must happen in a hospital. Treatment may include:  Antibiotic medicines, if the pneumonia was caused by bacteria.  Antiviral medicines, if the pneumonia was caused by a virus.  Medicines that are given by mouth or through an IV tube.  Oxygen.  Respiratory therapy. Although rare, treating severe pneumonia may include:  Mechanical ventilation. This is done if you are not breathing well on your own and you cannot maintain a safe blood oxygen level.  Thoracentesis. This procedureremoves fluid around one lung or both lungs to help you breathe better. Follow these instructions at home:  Take over-the-counter and prescription medicines only as told by your health care provider.  Only takecough medicine if you are losing sleep. Understand that cough medicine can prevent your bodys natural ability to remove mucus from your lungs.  If you were prescribed an antibiotic medicine, take it as told by  your health care provider. Do not stop taking the antibiotic even if you start to feel better.  Sleep in a  semi-upright position at night. Try sleeping in a reclining chair, or place a few pillows under your head.  Do not use tobacco products, including cigarettes, chewing tobacco, and e-cigarettes. If you need help quitting, ask your health care provider.  Drink enough water to keep your urine clear or pale yellow. This will help to thin out mucus secretions in your lungs. How is this prevented? There are ways that you can decrease your risk of developing community-acquired pneumonia. Consider getting a pneumococcal vaccine if:  You are older than 72 years of age.  You are older than 72 years of age and are undergoing cancer treatment, have chronic lung disease, or have other medical conditions that affect your immune system. Ask your health care provider if this applies to you. There are different types and schedules of pneumococcal vaccines. Ask your health care provider which vaccination option is best for you. You may also prevent community-acquired pneumonia if you take these actions:  Get an influenza vaccine every year. Ask your health care provider which type of influenza vaccine is best for you.  Go to the dentist on a regular basis.  Wash your hands often. Use hand sanitizer if soap and water are not available. Contact a health care provider if:  You have a fever.  You are losing sleep because you cannot control your cough with cough medicine. Get help right away if:  You have worsening shortness of breath.  You have increased chest pain.  Your sickness becomes worse, especially if you are an older adult or have a weakened immune system.  You cough up blood. This information is not intended to replace advice given to you by your health care provider. Make sure you discuss any questions you have with your health care provider. Document Released: 06/23/2005 Document Revised: 11/01/2015 Document Reviewed: 10/18/2014 Elsevier Interactive Patient Education  2017 [employer].

## 2016-10-27 NOTE — Discharge Summary (Signed)
[facility] NAME: John Mendoza    MR#:  [id number]  DATE OF BIRTH:  07-18-1945  DATE OF ADMISSION:  10/22/2016 ADMITTING PHYSICIAN: Theodoro Grist, MD  DATE OF DISCHARGE: 10/27/16  PRIMARY CARE PHYSICIAN: Tama High III, MD    ADMISSION DIAGNOSIS:  Sepsis, due to unspecified organism ([place]) [A41.9] Pneumonia of left lower lobe due to infectious organism ([place]) [J18.1]  DISCHARGE DIAGNOSIS:  Active Problems:   Acute on chronic respiratory failure with hypoxia ([place])   Community acquired pneumonia   Elevated troponin   Atrial tachycardia ([place])   Protein-calorie malnutrition, severe   SECONDARY DIAGNOSIS:   Past Medical History:  Diagnosis Date  . Anxiety   . Arthritis   . Barrett esophagus   . Basal cell carcinoma   . CAD (coronary artery disease)    on 2l home oxygen  . CHF (congestive heart failure) ([place])   . Chronic lower back pain   . COPD (chronic obstructive pulmonary disease) ([place])   . Depression   . DJD (degenerative joint disease)   . DVT (deep venous thrombosis) ([place]) 11/2013; 02/01/2015   on coumadin  . GERD (gastroesophageal reflux disease)   . 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 Heart attack ([place]) 03/20/1995  . History of gout   . Hyperlipidemia   . Hypertension   . Hypothyroidism   . Iron deficiency anemia   . Lumbar spinal stenosis   . Multiple pulmonary nodules   . On home oxygen therapy   . Parkinson's disease ([place])   . Recurrent aspiration pneumonia ([place])   . Seizures ([place])   . Sleep apnea   . Throat cancer ([place])   . Type II diabetes mellitus ([place])     HOSPITAL COURSE:   HISTORY OF PRESENT ILLNESS: John Mendoza  is a 71 y.o. 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 fib, Parkinson's disease, who presents to the hospital with complaints of not feeling well for the past 5-6 days, coughing, producing green thick phlegm, running low-grade fever to 100.8 maximum, short of breath more than usual, using oxygen more frequently, having chills, lightheadedness, feeling presyncopal, fatigued and weak. On arrival to emergency room, he was noted to be tachycardic with heart rate around 120, in unifocal ectopic atrial tachycardia, chest x-ray was concerning for left lower lobe pneumonia, hospitalist services were contacted for admission.  Hospital course  1. Acute on chronic respiratory failure with hypoxia. 2/2 RML pna Hx Copd. Patient is off BiPAP.  2. Pneumonia right middle lobe with history of aspiration pneumonia. MRSA PCR is negative but sputum culture growing staphMSSA Patient was on vancomycin and cefepime, changed to by mouth doxycycline for a total of 3 moredays. Follow up with op pulmonology as needed. Repeat chest x-ray with worsening pneumonia versus atelectasis but patient is clinically doing fine. Continue suctioning as needed basis. Continue home oxygen 2 L via nasal cannula. Okay to discharge patient from pulmonology standpoint but he is at high risk for readmission       Secondary to comorbidities 3. Atrial fibrillation, borderline troponin.  Demand ischemia. D/ced amio gtt, on amiodarone via PEG tube 4. Parkinson's disease with resting tremor on Sinemet 5. Type 2 diabetes mellitus on  sliding scale only 6. History of DVT on Coumadin. INR 1.55 today, continue warfarin and repeat PT INR by home health on April 25. Results need to be faxed over to primary care physician for further management of Coumadin 7. Chronic systolic congestive heart failure with an EF of 20-25%. Watch fluid status closely 8. Hyperlipidemia unspecified on Lipitor  9. Anxiety on clonazepam, abilify  10.   hypothyroidism unspecified on levothyroxine 11. Severe malnutrition. Feeding supplements ensure enlive 12. Generalized weakness PT consult  is recommending home health PT 13. Discharge patient home today and patient needs op  palliative care follow-up  DISCHARGE CONDITIONS:   fair  CONSULTS OBTAINED:  Treatment Team:  Teodoro Spray, MD   PROCEDURES   DRUG ALLERGIES:   Allergies  Allergen Reactions  . Other Other (See Comments)    Dizziness, lightheadedness, Pt states that inhaled medications make him depressed and have negative thoughts.    . Sulfa Antibiotics Rash    Headache    DISCHARGE MEDICATIONS:   Current Discharge Medication List    START taking these medications   Details  amiodarone (PACERONE) 200 MG tablet Place 2 tablets (400 mg total) into feeding tube 2 (two) times daily. Qty: 70 tablet, Refills: 0    aspirin 81 MG chewable tablet Place 1 tablet (81 mg total) into feeding tube at bedtime.    doxycycline (VIBRA-TABS) 100 MG tablet Place 1 tablet (100 mg total) into feeding tube every 12 (twelve) hours. Qty: 6 tablet, Refills: 0    feeding supplement, ENSURE ENLIVE, (ENSURE ENLIVE) LIQD Place 237 mLs into feeding tube 5 (five) times daily. Qty: 237 mL, Refills: 60    guaiFENesin-dextromethorphan (ROBITUSSIN DM) 100-10 MG/5ML syrup Place 5 mLs into feeding tube every 8 (eight) hours. Qty: 118 mL, Refills: 0    polyethylene glycol (MIRALAX / GLYCOLAX) packet Place 17 g into feeding tube daily as needed for mild constipation. Qty: 14 each, Refills: 0      CONTINUE these medications which have CHANGED   Details  acetaminophen (TYLENOL) 500 MG tablet Place 1 tablet (500 mg total) into feeding tube every 6 (six) hours as needed. Qty: 30 tablet, Refills: 0    ARIPiprazole (ABILIFY) 5 MG tablet Place 1 tablet (5 mg total) into feeding tube 2 (two) times daily.    atorvastatin (LIPITOR) 40 MG tablet Place 1 tablet (40 mg total) into feeding tube at  bedtime.    bisoprolol (ZEBETA) 10 MG tablet Place 1 tablet (10 mg total) into feeding tube daily.    carbidopa-levodopa (SINEMET IR) 25-250 MG tablet Place 2 tablets into feeding tube 3 (three) times daily.    clonazePAM (KLONOPIN) 1 MG tablet Place 1.5 tablets (1.5 mg total) into feeding tube at bedtime. Take 1 mg in the morning and 1.5 mg at night. Qty: 45 tablet, Refills: 0    HYDROcodone-acetaminophen (NORCO/VICODIN) 5-325 MG tablet Place 1 tablet into feeding tube at bedtime. Qty: 30 tablet, Refills: 0    lamoTRIgine (LAMICTAL) 25 MG tablet Place 1 tablet (25 mg total) into feeding tube 2 (two) times daily.    levothyroxine (SYNTHROID, LEVOTHROID) 137 MCG tablet Place 1 tablet (137 mcg total) into feeding tube daily.    metFORMIN (GLUCOPHAGE) 500 MG tablet Place 1 tablet (500 mg total) into feeding tube 2 (two) times daily.    warfarin (COUMADIN) 6 MG tablet Place 1 tablet (6 mg total) into feeding tube daily at 6 PM. Qty: 30 tablet, Refills: 0      CONTINUE  these medications which have NOT CHANGED   Details  losartan (COZAAR) 25 MG tablet Take 25 mg by mouth at bedtime.    nitroGLYCERIN (NITROSTAT) 0.4 MG SL tablet Place 0.4 mg under the tongue every 5 (five) minutes x 3 doses as needed for chest pain.     omeprazole (PRILOSEC) 20 MG capsule Take 20 mg by mouth 2 (two) times daily before a meal.     PARoxetine (PAXIL) 20 MG tablet Take 60 mg by mouth daily.     tamsulosin (FLOMAX) 0.4 MG CAPS Take 0.4 mg by mouth daily.     bisacodyl (DULCOLAX) 10 MG suppository Place 1 suppository (10 mg total) rectally daily as needed for moderate constipation. Qty: 12 suppository, Refills: 0    Respiratory Therapy Supplies (FLUTTER) DEVI USE AS DIRECTED Qty: 1 each, Refills: 0      STOP taking these medications     aspirin EC 81 MG tablet      triamcinolone cream (KENALOG) 0.5 %      fentaNYL (DURAGESIC - DOSED MCG/HR) 50 MCG/HR      sulfamethoxazole-trimethoprim (BACTRIM  DS) 800-160 MG tablet      sulfamethoxazole-trimethoprim (BACTRIM DS,SEPTRA DS) 800-160 MG tablet          DISCHARGE INSTRUCTIONS:   Follow-up with primary care physician in 5-7 days Follow-up with neurology for Parkinson's disease in 1-2 weeks Continue 2 L of oxygen via nasal cannula Home health repeat PT/INR on April 25 and results need to be faxed over to primary care physician for further management of Coumadin   DIET:  Diabetic diet via PEG tube , pt is NPO  DISCHARGE CONDITION:  Stable  ACTIVITY:  Activity as tolerated per PT  OXYGEN:  Home Oxygen: Yes.     Oxygen Delivery: 2 liters/min via Patient connected to nasal cannula oxygen  DISCHARGE LOCATION:  home with [place]  If you experience worsening of your admission symptoms, develop shortness of breath, life threatening emergency, suicidal or homicidal thoughts you must seek medical attention immediately by calling 911 or calling your MD immediately  if symptoms less severe.  You Must read complete instructions/literature along with all the possible adverse reactions/side effects for all the Medicines you take and that have been prescribed to you. Take any new Medicines after you have completely understood and accpet all the possible adverse reactions/side effects.   Please note  You were cared for by a hospitalist during your hospital stay. If you have any questions about your discharge medications or the care you received while you were in the hospital after you are discharged, you can call the unit and asked to speak with the hospitalist on call if the hospitalist that took care of you is not available. Once you are discharged, your primary care physician will handle any further medical issues. Please note that NO REFILLS for any discharge medications will be authorized once you are discharged, as it is imperative that you return to your primary care physician (or establish a relationship with a primary care  physician if you do not have one) for your aftercare needs so that they can reassess your need for medications and monitor your lab values.     Today  Chief Complaint  Patient presents with  . Pneumonia   Patient is feeling much better. Shortness of breath improved. Feels comfortable to go home. Okay to discharge patient from pulmonology standpoint  ROS:  CONSTITUTIONAL: Denies fevers, chills. Denies any fatigue, weakness.  EYES: Denies blurry  vision, double vision, eye pain. EARS, NOSE, THROAT: Denies tinnitus, ear pain, hearing loss. RESPIRATORY: Denies cough, wheeze, shortness of breath.  CARDIOVASCULAR: Denies chest pain, palpitations, edema.  GASTROINTESTINAL: Denies nausea, vomiting, diarrhea, abdominal pain. Denies bright red blood per rectum. GENITOURINARY: Denies dysuria, hematuria. ENDOCRINE: Denies nocturia or thyroid problems. HEMATOLOGIC AND LYMPHATIC: Denies easy bruising or bleeding. SKIN: Denies rash or lesion. MUSCULOSKELETAL: Denies pain in neck, back, shoulder, knees, hips or arthritic symptoms.  NEUROLOGIC: Denies paralysis, paresthesias.  PSYCHIATRIC: Denies anxiety or depressive symptoms.   VITAL SIGNS:  Blood pressure 123/89, pulse 92, temperature 98.6 F (37 C), temperature source Oral, resp. rate 20, height 5\' 9"  (1.753 m), weight 59 kg (130 lb), SpO2 98 %.  I/O:    Intake/Output Summary (Last 24 hours) at 10/27/16 1427 Last data filed at 10/27/16 0800  Gross per 24 hour  Intake              200 ml  Output              600 ml  Net             -400 ml    PHYSICAL EXAMINATION:  GENERAL:  70 y.o.-year-old patient lying in the bed with no acute distress.  EYES: Pupils equal, round, reactive to light and accommodation. No scleral icterus. Extraocular muscles intact.  HEENT: Head atraumatic, normocephalic. Oropharynx and nasopharynx clear.  NECK:  Supple, no jugular venous distention. No thyroid enlargement, no tenderness.  LUNGS: Mod breath sounds  bilaterally, no wheezing, rales, has some chronic rhonchi or crepitation. No use of accessory muscles of respiration.  CARDIOVASCULAR: S1, S2 normal. No murmurs, rubs, or gallops.  ABDOMEN: Soft, non-tender, non-distended. Bowel sounds present. No organomegaly or mass.  EXTREMITIES: No pedal edema, cyanosis, or clubbing.  NEUROLOGIC: Cranial nerves II through XII are intact. Muscle strength 5/5 in all extremities. Sensation intact. Gait not checked.  PSYCHIATRIC: The patient is alert and oriented x 3.  SKIN: No obvious rash, lesion, or ulcer.   DATA REVIEW:   CBC  Recent Labs Lab 10/27/16 0410  WBC 4.7  HGB 10.1*  HCT 31.2*  PLT 163    Chemistries   Recent Labs Lab 10/27/16 0410  NA 138  K 4.0  CL 96*  CO2 36*  GLUCOSE 88  BUN 28*  CREATININE 0.47*  CALCIUM 8.8*    Cardiac Enzymes  Recent Labs Lab 10/24/16 0410  TROPONINI 0.05*    Microbiology Results  Results for orders placed or performed during the hospital encounter of 10/22/16  Blood culture (routine x 2)     Status: None   Collection Time: 10/22/16  2:02 PM  Result Value Ref Range Status   Specimen Description BLOOD R FA  Final   Special Requests   Final    BOTTLES DRAWN AEROBIC AND ANAEROBIC Blood Culture adequate volume   Culture NO GROWTH 5 DAYS  Final   Report Status 10/27/2016 FINAL  Final  Blood culture (routine x 2)     Status: None   Collection Time: 10/22/16  2:02 PM  Result Value Ref Range Status   Specimen Description BLOOD L FA  Final   Special Requests   Final    BOTTLES DRAWN AEROBIC AND ANAEROBIC Blood Culture adequate volume   Culture NO GROWTH 5 DAYS  Final   Report Status 10/27/2016 FINAL  Final  Culture, expectorated sputum-assessment     Status: None   Collection Time: 10/22/16  3:06 PM  Result Value  Ref Range Status   Specimen Description SPU  Final   Special Requests NONE  Final   Sputum evaluation THIS SPECIMEN IS ACCEPTABLE FOR SPUTUM CULTURE  Final   Report Status  10/22/2016 FINAL  Final  Culture, respiratory (NON-Expectorated)     Status: None   Collection Time: 10/22/16  3:06 PM  Result Value Ref Range Status   Specimen Description SPU  Final   Special Requests NONE Reflexed from [id number]  Final   Gram Stain   Final    ABUNDANT WBC PRESENT,BOTH PMN AND MONONUCLEAR ABUNDANT IN CLUSTERS IN PAIRS GRAM POSITIVE COCCI Performed at [facility], [address] [place], [place] [address]    Culture ABUNDANT STAPHYLOCOCCUS AUREUS  Final   Report Status 10/25/2016 FINAL  Final   Organism ID, Bacteria STAPHYLOCOCCUS AUREUS  Final      Susceptibility   Staphylococcus aureus - MIC*    CIPROFLOXACIN 1 SENSITIVE Sensitive     ERYTHROMYCIN RESISTANT Resistant     GENTAMICIN <=0.5 SENSITIVE Sensitive     OXACILLIN <=0.25 SENSITIVE Sensitive     TETRACYCLINE <=1 SENSITIVE Sensitive     VANCOMYCIN 1 SENSITIVE Sensitive     TRIMETH/SULFA <=10 SENSITIVE Sensitive     CLINDAMYCIN RESISTANT Resistant     RIFAMPIN <=0.5 SENSITIVE Sensitive     Inducible Clindamycin POSITIVE Resistant     * ABUNDANT STAPHYLOCOCCUS AUREUS  MRSA PCR Screening     Status: None   Collection Time: 10/22/16  5:55 PM  Result Value Ref Range Status   MRSA by PCR NEGATIVE NEGATIVE Final    Comment:        The GeneXpert MRSA Assay (FDA approved for NASAL specimens only), is one component of a comprehensive MRSA colonization surveillance program. It is not intended to diagnose MRSA infection nor to guide or monitor treatment for MRSA infections.     RADIOLOGY:  Dg Chest 2 View  Result Date: 10/26/2016 CLINICAL DATA:  Productive cough.  Pneumonia diagnosed last week. EXAM: CHEST  2 VIEW COMPARISON:  10/24/2016. 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 Increased left basilar airspace opacity. No significant change in mild airspace opacity at the medial right lung base. Small bilateral pleural effusions. Mild increase in prominence of the  interstitial markings. Diffuse osteopenia. Thoracic spine degenerative changes. IMPRESSION: 1. Worsening left basilar atelectasis or pneumonia. 2. Stable pneumonia or patchy atelectasis at the medial right lung base. 3. Small bilateral pleural effusions, cardiomegaly and chronic interstitial lung disease with probable mild superimposed interstitial pulmonary edema. Electronically Signed   By: Claudie Revering M.D.   On: 10/26/2016 11:11   Dg Chest Port 1 View  Result Date: 10/24/2016 CLINICAL DATA:  Respiratory failure EXAM: PORTABLE CHEST 1 VIEW COMPARISON:  10/22/2016 FINDINGS: Cardiac shadow is stable. Postsurgical changes are again noted. Significant improved aeration is noted in the bases bilaterally with only minimal residual atelectasis. No new focal abnormality is seen. IMPRESSION: Significant improved aeration in the bases bilaterally. Electronically Signed   By: Inez Catalina M.D.   On: 10/24/2016 07:04    EKG:   Orders placed or performed during the hospital encounter of 10/22/16  . ED EKG  . ED EKG  . EKG 12-Lead  . EKG 12-Lead      Management plans discussed with the patient, family and they are in agreement.  CODE STATUS:     Code Status Orders        Start     Ordered  10/22/16 1740  Full code  Continuous     10/22/16 1739    Code Status History    Date Active Date Inactive Code Status Order ID Comments User Context   01/04/2016 11:22 PM 01/11/2016  6:13 PM Full Code [id number]  Lance Coon, MD Inpatient   12/11/2015  5:09 PM 12/13/2015  4:53 PM Full Code [id number]  Fritzi Mandes, MD Inpatient   11/29/2015  9:13 PM 12/01/2015  1:58 PM Full Code [id number]  Gladstone Lighter, MD Inpatient   03/06/2015 10:40 AM 03/08/2015  2:22 PM Full Code [id number]  Adin Hector, MD Inpatient   02/01/2015  5:36 PM 02/08/2015  6:46 PM Full Code [id number]  Samella Parr, NP Inpatient   01/26/2015 10:05 PM 02/01/2015  5:36 PM Full Code [id number]  Demetrios Loll, MD Inpatient    Advance Directive  Documentation     Most Recent Value  Type of Advance Directive  Out of facility DNR (pink MOST or yellow form)  Pre-existing out of facility DNR order (yellow form or pink MOST form)  -  "MOST" Form in Place?  -      TOTAL TIME TAKING CARE OF THIS PATIENT: 45  minutes.   Note: This dictation was prepared with Dragon dictation along with smaller phrase technology. Any transcriptional errors that result from this process are unintentional.   @[facility] @  on 10/27/2016 at 2:27 PM  Between 7am to 6pm - Pager - [phone]  After 6pm go to [email or web address] - password [email or web address] [facility]  [facility]  Office  [phone]  CC: Primary care physician; Adin Hector, MD

## 2016-10-27 NOTE — Progress Notes (Signed)
Much improved. No new complaints. Cognition intact. No distress  Vitals:   10/26/16 2010 10/27/16 0457 10/27/16 0918 10/27/16 1226  BP: (!) 145/86 131/82  123/89  Pulse: 96 95 (!) 102 92  Resp: 18 18  20   Temp: 98.4 F (36.9 C) 97.8 F (36.6 C)  98.6 F (37 C)  TempSrc: Oral Oral  Oral  SpO2: 95% 95% 90% 98%  Weight:      Height:      Mild tremor HEENT WNL No JVD Rhonchi, no wheezes  Reg, no M NABS No edema  BMP Latest Ref Rng & Units 10/27/2016 10/24/2016 10/23/2016  Glucose 65 - 99 mg/dL 88 97 97  BUN 6 - 20 mg/dL 28(H) 28(H) 23(H)  Creatinine 0.61 - 1.24 mg/dL 0.47(L) 0.50(L) 0.58(L)  Sodium 135 - 145 mmol/L 138 135 135  Potassium 3.5 - 5.1 mmol/L 4.0 4.0 4.1  Chloride 101 - 111 mmol/L 96(L) 94(L) 95(L)  CO2 22 - 32 mmol/L 36(H) 34(H) 32  Calcium 8.9 - 10.3 mg/dL 8.8(L) 9.0 8.8(L)   CBC Latest Ref Rng & Units 10/27/2016 10/24/2016 10/23/2016  WBC 3.8 - 10.6 K/uL 4.7 7.4 4.0  Hemoglobin 13.0 - 18.0 g/dL 10.1(L) 10.6(L) 11.4(L)  Hematocrit 40.0 - 52.0 % 31.2(L) 32.3(L) 35.1(L)  Platelets 150 - 440 K/uL 163 148(L) 150   Personally reviewed CXR : Cardiomegaly, RML atelectasis resolved resolved, mild diffuse interstitial prominence which appears minimally changed from previous  IMPRESSION: Acute respiratory failure - resolving; now on 2L and appears to be breathing comfortably.  Atelectasis, resolved MSSA bronchitis, sens to tetracycline, resistant to clinda, erythro.  Parkinson's disease debilty/deconditioning, with likely some degree of respiratory muscle weakness, and weak cough with pooling of secretions.   PLAN/REC: Ok for discharge from respiratory standpoint. Pt is at high risk of readmission and recurrence of pneumonia given chronic illness.   Marda Stalker, M.D. PCCM service Pager [phone] 10/27/2016

## 2016-10-27 NOTE — Progress Notes (Signed)
Patient discharged via wheelchair and private vehicle. IV removed and catheter intact. All discharge instructions given and patient verbalizes understanding. Tele removed and returned. No prescriptions given to patient No distress noted.   

## 2016-10-27 NOTE — Care Management (Signed)
Discussed need for palliative care consult for goals of treatment.  Patient on bolus tube feedings.  Home oxygen is  reported as chronic.  Physical therapy is recommending home health PT and notified [facility].

## 2017-03-11 IMAGING — DX DG ABDOMEN 2V
3 series · 3 of 3 positions shown · non-contrast
Comparison: None

CLINICAL DATA: Ileus, planned PEG tube placement

EXAM:
ABDOMEN - 2 VIEW

[abdomen erect]
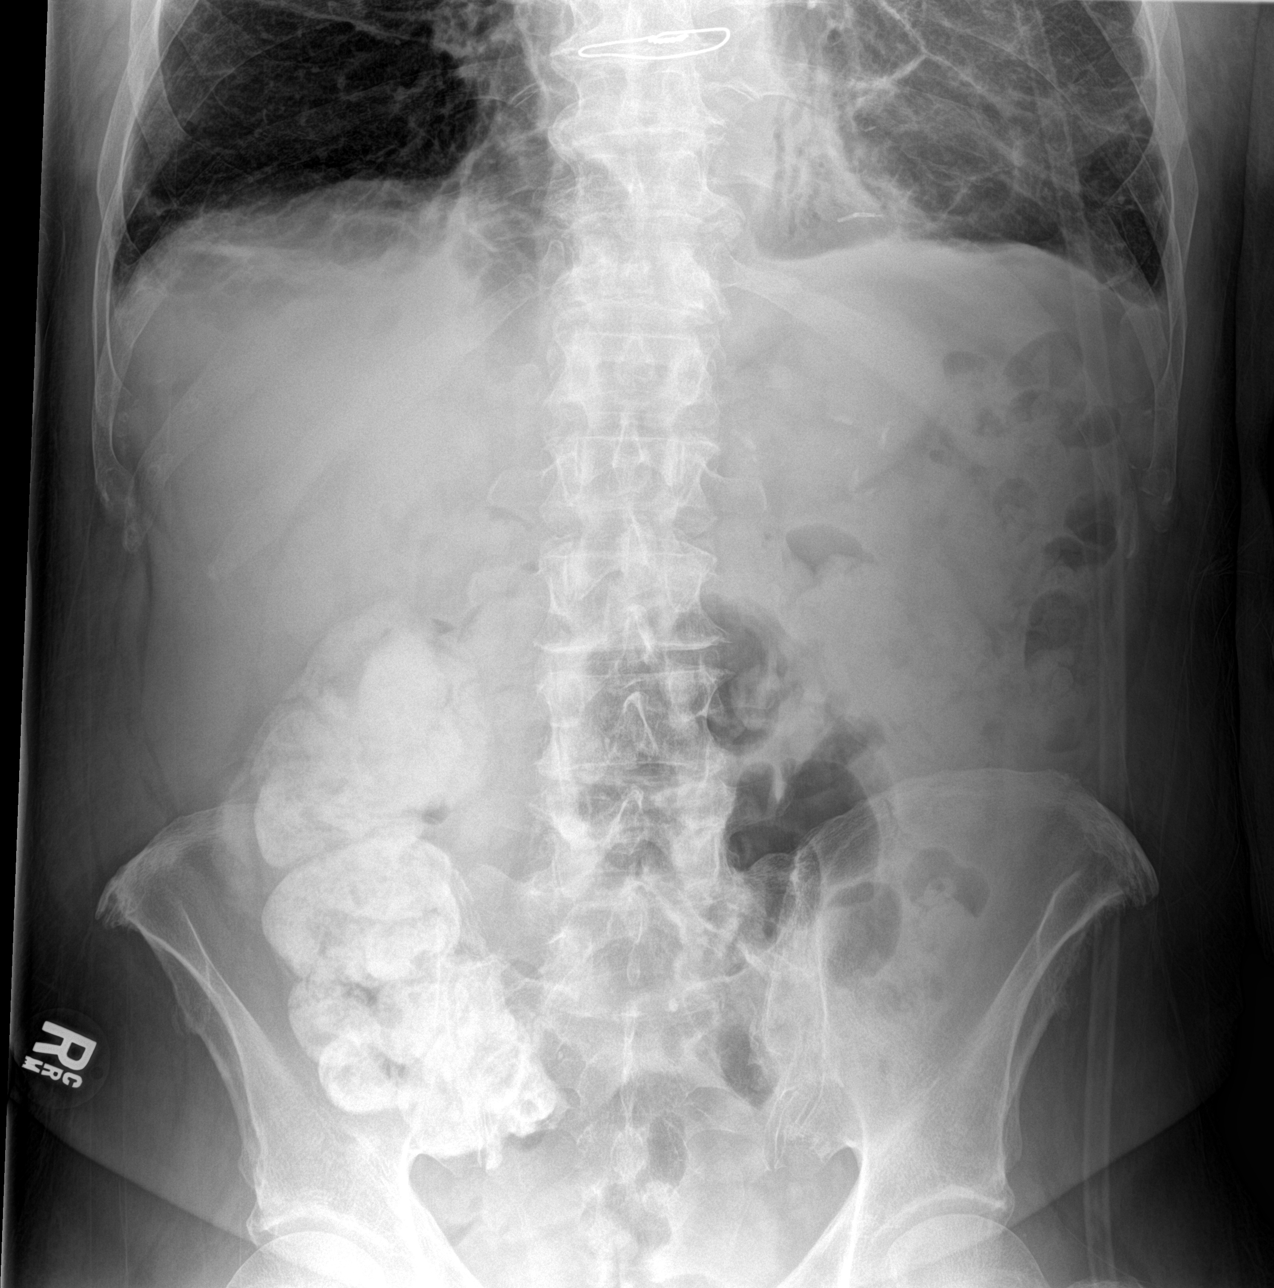

[abdomen supine (1 of 2)]
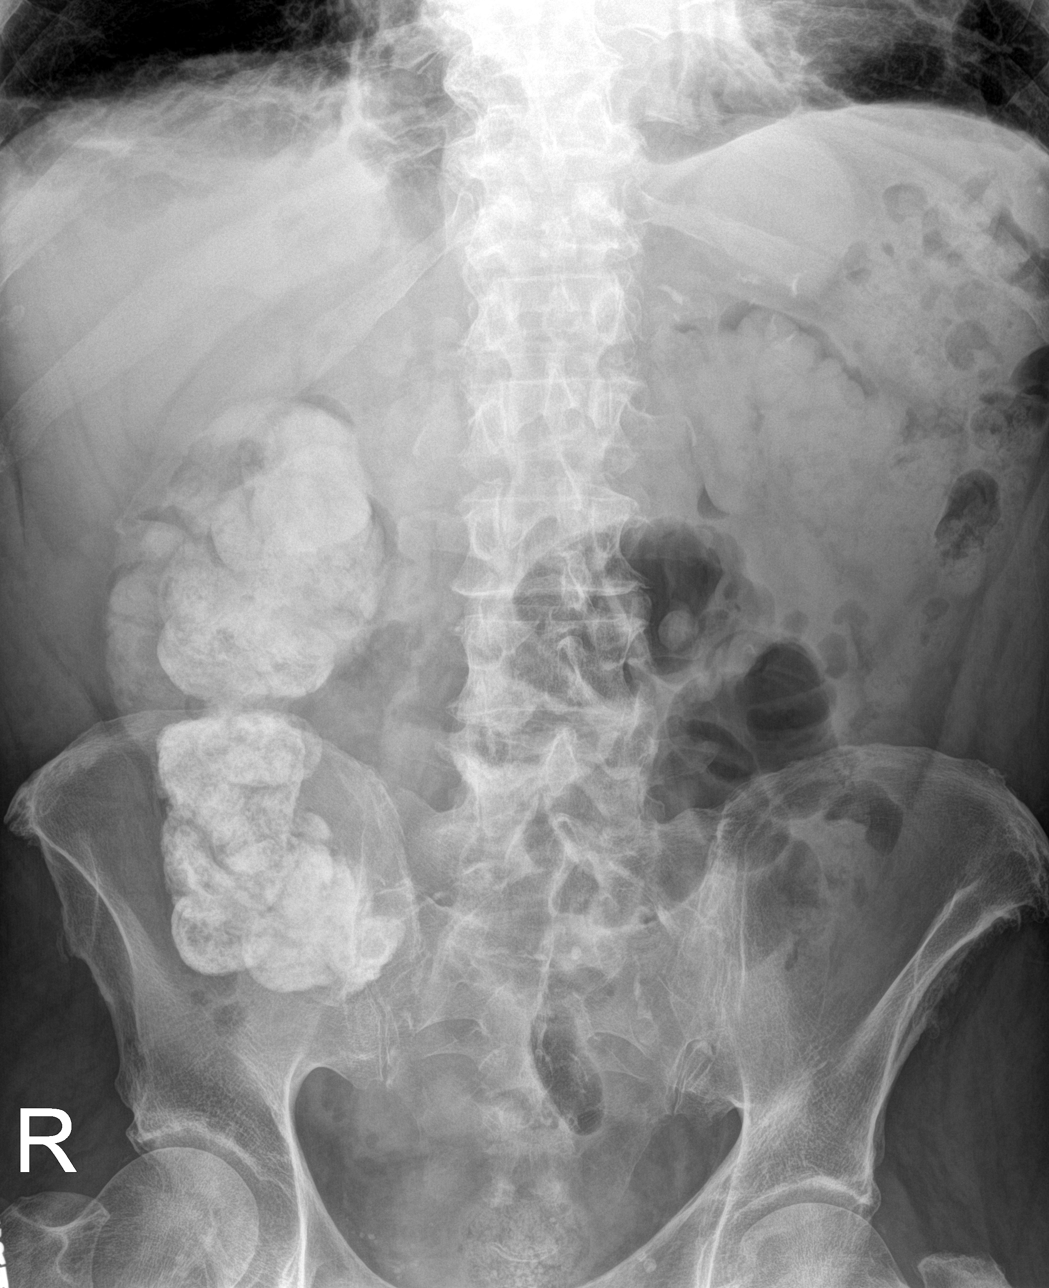

[abdomen supine (2 of 2)]
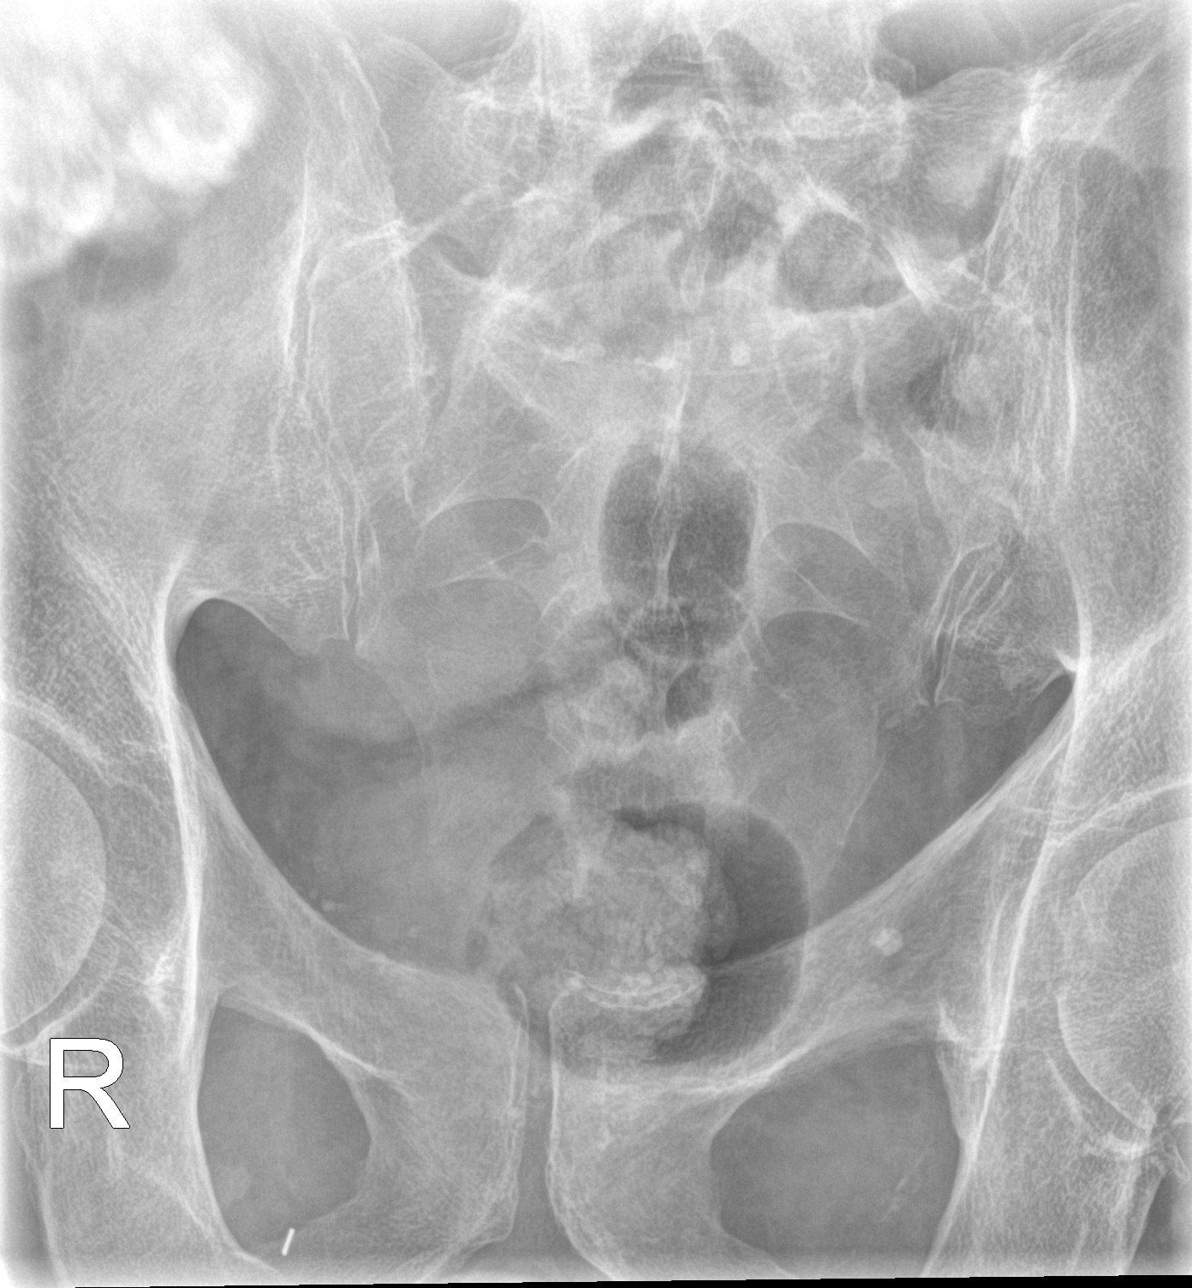

[3 of 3 positions shown; findings below may reference images not displayed]

FINDINGS: Significant stool in transverse and proximal descending colon.

Retained contrast in RIGHT colon through hepatic flexure to proximal
transverse colon.

Prominent small bowel loops in the mid abdomen could reflect ileus.

Small amount of stool in rectum.

No evidence of bowel obstruction or bowel wall thickening.

Bones demineralized.

Emphysematous changes with atelectasis and potential scarring at
lung bases.
IMPRESSION: Stool in transverse through proximal descending colon and contrast
in the RIGHT colon to proximal transverse colon.

Air-filled loops of small bowel in the mid abdomen, overall
nonobstructive pattern, question ileus.

## 2017-03-12 IMAGING — DX DG ABDOMEN 1V
1 series · 1 of 1 positions shown · non-contrast
Comparison: 02/05/2015

CLINICAL DATA: Constipation.  Evaluate for PEG tube placement.

EXAM:
ABDOMEN - 1 VIEW

[abdomen kub]
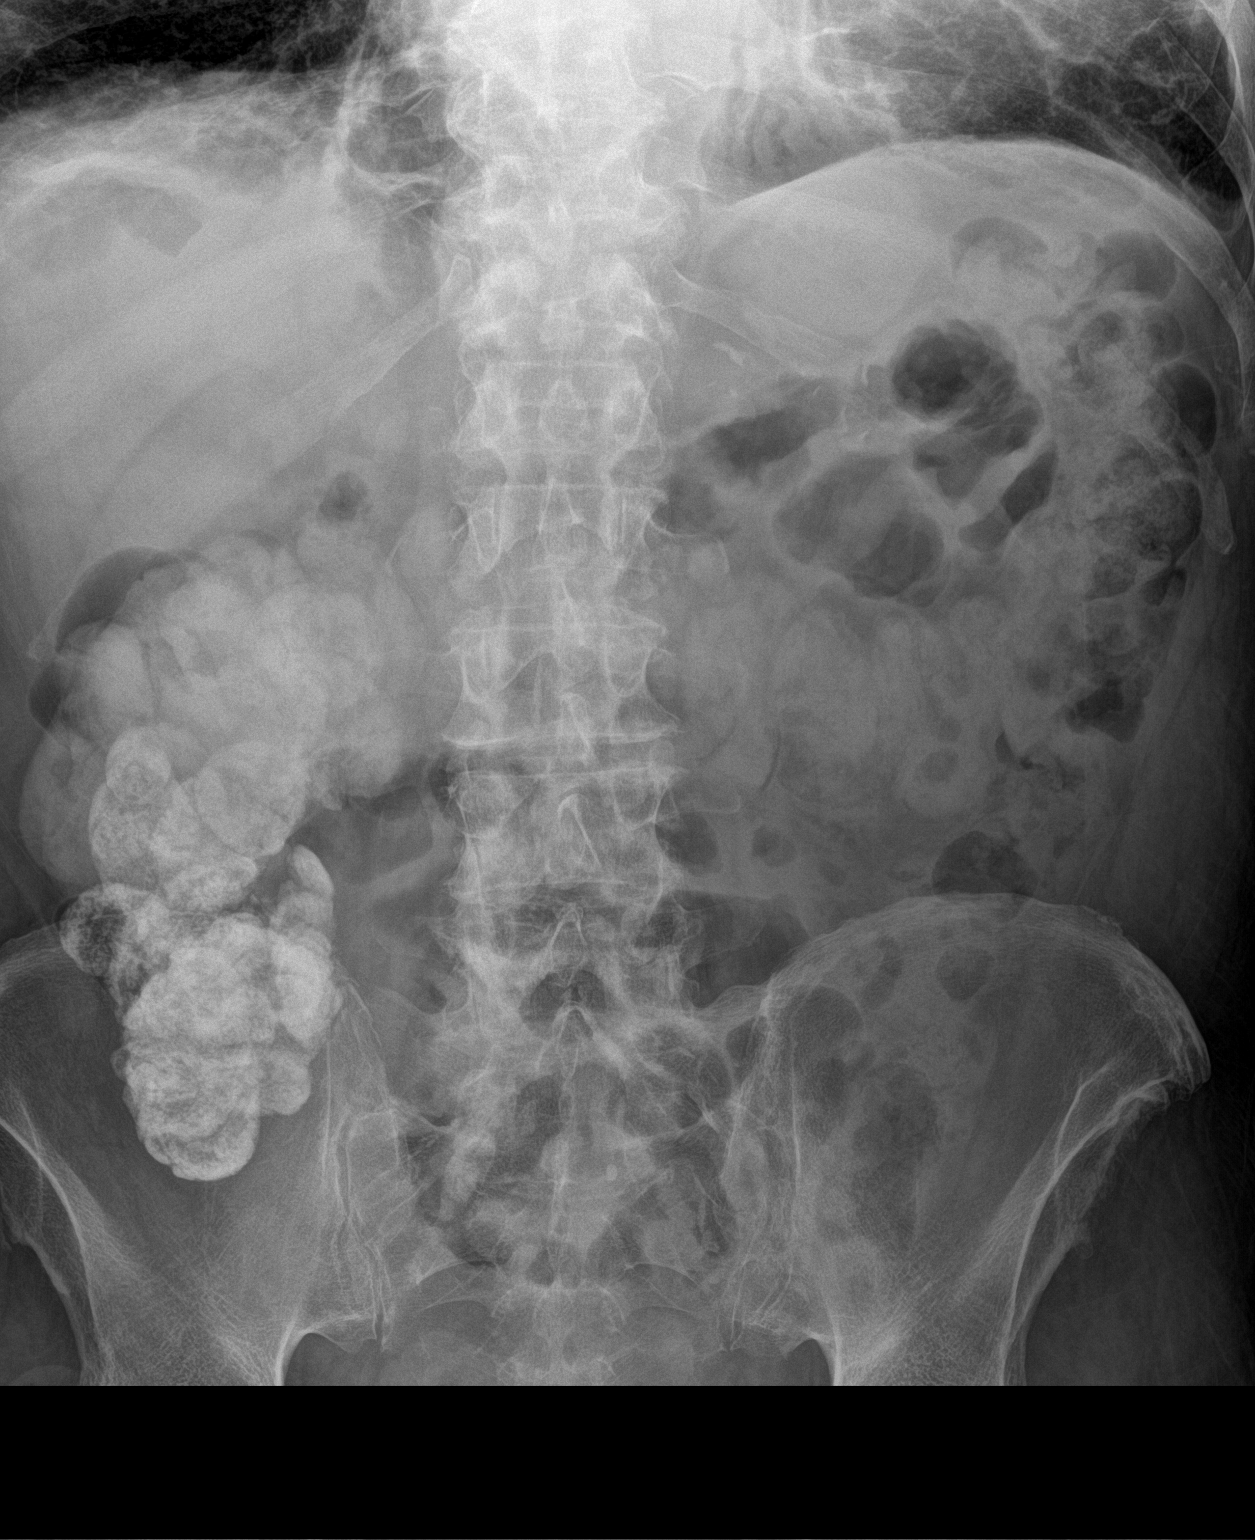

[1 of 1 positions shown; findings below may reference images not displayed]

FINDINGS: There is contrast in nondilated loops of colon. Significant stool
burden noted. No evidence for large or small bowel obstruction.
Degenerative changes seen in the lower thoracic and lumbar spine.
IMPRESSION: Significant retained contrast in the colon. Significant stool
burden.

## 2017-07-15 ENCOUNTER — Other Ambulatory Visit: Payer: Self-pay | Admitting: Internal Medicine

## 2017-07-28 ENCOUNTER — Inpatient Hospital Stay
Admission: EM | Admit: 2017-07-28 | Discharge: 2017-08-07 | DRG: 208 | Disposition: E | Payer: Medicare Other | Attending: Internal Medicine | Admitting: Internal Medicine

## 2017-07-28 ENCOUNTER — Emergency Department: Payer: Medicare Other

## 2017-07-28 ENCOUNTER — Other Ambulatory Visit: Payer: Self-pay

## 2017-07-28 ENCOUNTER — Encounter: Payer: Self-pay | Admitting: *Deleted

## 2017-07-28 DIAGNOSIS — Z951 Presence of aortocoronary bypass graft: Secondary | ICD-10-CM

## 2017-07-28 DIAGNOSIS — Z01818 Encounter for other preprocedural examination: Secondary | ICD-10-CM

## 2017-07-28 DIAGNOSIS — E8770 Fluid overload, unspecified: Secondary | ICD-10-CM

## 2017-07-28 DIAGNOSIS — J449 Chronic obstructive pulmonary disease, unspecified: Secondary | ICD-10-CM | POA: Diagnosis present

## 2017-07-28 DIAGNOSIS — I2510 Atherosclerotic heart disease of native coronary artery without angina pectoris: Secondary | ICD-10-CM | POA: Diagnosis present

## 2017-07-28 DIAGNOSIS — G20 Parkinson's disease: Secondary | ICD-10-CM | POA: Diagnosis present

## 2017-07-28 DIAGNOSIS — E872 Acidosis: Secondary | ICD-10-CM | POA: Diagnosis present

## 2017-07-28 DIAGNOSIS — Z9981 Dependence on supplemental oxygen: Secondary | ICD-10-CM

## 2017-07-28 DIAGNOSIS — J690 Pneumonitis due to inhalation of food and vomit: Secondary | ICD-10-CM | POA: Diagnosis not present

## 2017-07-28 DIAGNOSIS — Z7982 Long term (current) use of aspirin: Secondary | ICD-10-CM

## 2017-07-28 DIAGNOSIS — R0602 Shortness of breath: Secondary | ICD-10-CM | POA: Diagnosis not present

## 2017-07-28 DIAGNOSIS — R64 Cachexia: Secondary | ICD-10-CM | POA: Diagnosis present

## 2017-07-28 DIAGNOSIS — Z681 Body mass index (BMI) 19 or less, adult: Secondary | ICD-10-CM

## 2017-07-28 DIAGNOSIS — Z888 Allergy status to other drugs, medicaments and biological substances status: Secondary | ICD-10-CM

## 2017-07-28 DIAGNOSIS — Z79890 Hormone replacement therapy (postmenopausal): Secondary | ICD-10-CM

## 2017-07-28 DIAGNOSIS — E43 Unspecified severe protein-calorie malnutrition: Secondary | ICD-10-CM | POA: Diagnosis present

## 2017-07-28 DIAGNOSIS — I110 Hypertensive heart disease with heart failure: Secondary | ICD-10-CM | POA: Diagnosis present

## 2017-07-28 DIAGNOSIS — Z66 Do not resuscitate: Secondary | ICD-10-CM | POA: Diagnosis not present

## 2017-07-28 DIAGNOSIS — I252 Old myocardial infarction: Secondary | ICD-10-CM

## 2017-07-28 DIAGNOSIS — Z955 Presence of coronary angioplasty implant and graft: Secondary | ICD-10-CM

## 2017-07-28 DIAGNOSIS — E890 Postprocedural hypothyroidism: Secondary | ICD-10-CM | POA: Diagnosis present

## 2017-07-28 DIAGNOSIS — E119 Type 2 diabetes mellitus without complications: Secondary | ICD-10-CM | POA: Diagnosis present

## 2017-07-28 DIAGNOSIS — I959 Hypotension, unspecified: Secondary | ICD-10-CM | POA: Diagnosis not present

## 2017-07-28 DIAGNOSIS — G4730 Sleep apnea, unspecified: Secondary | ICD-10-CM | POA: Diagnosis present

## 2017-07-28 DIAGNOSIS — J189 Pneumonia, unspecified organism: Secondary | ICD-10-CM

## 2017-07-28 DIAGNOSIS — Z882 Allergy status to sulfonamides status: Secondary | ICD-10-CM

## 2017-07-28 DIAGNOSIS — E785 Hyperlipidemia, unspecified: Secondary | ICD-10-CM | POA: Diagnosis present

## 2017-07-28 DIAGNOSIS — D638 Anemia in other chronic diseases classified elsewhere: Secondary | ICD-10-CM | POA: Diagnosis present

## 2017-07-28 DIAGNOSIS — K2270 Barrett's esophagus without dysplasia: Secondary | ICD-10-CM | POA: Diagnosis present

## 2017-07-28 DIAGNOSIS — Z79899 Other long term (current) drug therapy: Secondary | ICD-10-CM

## 2017-07-28 DIAGNOSIS — K219 Gastro-esophageal reflux disease without esophagitis: Secondary | ICD-10-CM | POA: Diagnosis present

## 2017-07-28 DIAGNOSIS — Z7901 Long term (current) use of anticoagulants: Secondary | ICD-10-CM

## 2017-07-28 DIAGNOSIS — J9602 Acute respiratory failure with hypercapnia: Secondary | ICD-10-CM | POA: Diagnosis present

## 2017-07-28 DIAGNOSIS — F444 Conversion disorder with motor symptom or deficit: Secondary | ICD-10-CM | POA: Diagnosis present

## 2017-07-28 DIAGNOSIS — Z7401 Bed confinement status: Secondary | ICD-10-CM

## 2017-07-28 DIAGNOSIS — F329 Major depressive disorder, single episode, unspecified: Secondary | ICD-10-CM | POA: Diagnosis present

## 2017-07-28 DIAGNOSIS — Z87891 Personal history of nicotine dependence: Secondary | ICD-10-CM

## 2017-07-28 DIAGNOSIS — Z7984 Long term (current) use of oral hypoglycemic drugs: Secondary | ICD-10-CM

## 2017-07-28 DIAGNOSIS — Z86718 Personal history of other venous thrombosis and embolism: Secondary | ICD-10-CM

## 2017-07-28 DIAGNOSIS — J9601 Acute respiratory failure with hypoxia: Secondary | ICD-10-CM | POA: Diagnosis present

## 2017-07-28 DIAGNOSIS — I5022 Chronic systolic (congestive) heart failure: Secondary | ICD-10-CM | POA: Diagnosis present

## 2017-07-28 DIAGNOSIS — Z96651 Presence of right artificial knee joint: Secondary | ICD-10-CM | POA: Diagnosis present

## 2017-07-28 DIAGNOSIS — Z931 Gastrostomy status: Secondary | ICD-10-CM

## 2017-07-28 DIAGNOSIS — I4892 Unspecified atrial flutter: Secondary | ICD-10-CM | POA: Diagnosis present

## 2017-07-28 DIAGNOSIS — Z515 Encounter for palliative care: Secondary | ICD-10-CM | POA: Diagnosis not present

## 2017-07-28 LAB — BASIC METABOLIC PANEL
Anion gap: 7 (ref 5–15)
BUN: 24 mg/dL — ABNORMAL HIGH (ref 6–20)
CALCIUM: 9.3 mg/dL (ref 8.9–10.3)
CHLORIDE: 97 mmol/L — AB (ref 101–111)
CO2: 34 mmol/L — AB (ref 22–32)
CREATININE: 0.73 mg/dL (ref 0.61–1.24)
GFR calc non Af Amer: >60 mL/min (ref >60)
Glucose, Bld: 112 mg/dL — ABNORMAL HIGH (ref 65–99)
Potassium: 4.6 mmol/L (ref 3.5–5.1)
Sodium: 138 mmol/L (ref 135–145)

## 2017-07-28 LAB — CBC WITH DIFFERENTIAL/PLATELET
BASOS ABS: 0 10*3/uL (ref 0–0.1)
BASOS PCT: 0 %
EOS ABS: 0 10*3/uL (ref 0–0.7)
Eosinophils Relative: 0 %
HEMATOCRIT: 25.2 % — AB (ref 40.0–52.0)
HEMOGLOBIN: 8.2 g/dL — AB (ref 13.0–18.0)
Lymphocytes Relative: 3 %
Lymphs Abs: 0.2 10*3/uL — ABNORMAL LOW (ref 1.0–3.6)
MCH: 29.9 pg (ref 26.0–34.0)
MCHC: 32.3 g/dL (ref 32.0–36.0)
MCV: 92.6 fL (ref 80.0–100.0)
Monocytes Absolute: 0.4 10*3/uL (ref 0.2–1.0)
Monocytes Relative: 7 %
NEUTROS ABS: 5.4 10*3/uL (ref 1.4–6.5)
NEUTROS PCT: 90 %
Platelets: 100 10*3/uL — ABNORMAL LOW (ref 150–440)
RBC: 2.73 MIL/uL — ABNORMAL LOW (ref 4.40–5.90)
RDW: 14.5 % (ref 11.5–14.5)
WBC: 6 10*3/uL (ref 3.8–10.6)

## 2017-07-28 LAB — TROPONIN I: Troponin I: 0.03 ng/mL (ref <0.03)

## 2017-07-28 LAB — LACTIC ACID, PLASMA: Lactic Acid, Venous: 0.6 mmol/L (ref 0.5–1.9)

## 2017-07-28 LAB — INFLUENZA PANEL BY PCR (TYPE A & B)
INFLBPCR: NEGATIVE
Influenza A By PCR: NEGATIVE

## 2017-07-28 MED ORDER — IOPAMIDOL (ISOVUE-370) INJECTION 76%
75.0000 mL | Freq: Once | INTRAVENOUS | Status: AC | PRN
  Administered 2017-07-28: 75 mL via INTRAVENOUS

## 2017-07-28 MED ORDER — VANCOMYCIN HCL IN DEXTROSE 1-5 GM/200ML-% IV SOLN
1000.0000 mg | Freq: Once | INTRAVENOUS | Status: AC
  Administered 2017-07-28: 1000 mg via INTRAVENOUS
  Filled 2017-07-28: qty 200

## 2017-07-28 MED ORDER — DEXTROSE 5 % IV SOLN
2.0000 g | Freq: Once | INTRAVENOUS | Status: AC
  Administered 2017-07-28: 2 g via INTRAVENOUS
  Filled 2017-07-28: qty 2

## 2017-07-28 NOTE — ED Triage Notes (Signed)
Pt brought in via ems from home with a cough and sob. Intermittent chest pain.   Pt reports sx for 2 days   Pt alert. md at bedside.

## 2017-07-28 NOTE — ED Notes (Signed)
Labs sent at this time.

## 2017-07-28 NOTE — ED Provider Notes (Signed)
[facility] Emergency Department Provider Note  _____________________________________   I have reviewed the triage vital signs and the nursing notes.   HISTORY  Chief Complaint Shortness of Breath   History limited by: Not Limited   HPI John Mendoza is a 72 y.o. male who presents to the emergency department today by EMS because of concerns for increasing shortness of breath.  Patient states the symptoms have been getting worse for the past 2 days.  His shortness of breath has been accompanied by cough.  It has been reductive with greenish phlegm.  The patient has had somewhat mild chest discomfort with this.  He has not had any measured fevers at home although he has had chills.  Family states patient has had issues with aspiration pneumonia in the past.  He does have a G-tube in place.  Has initial concern for the G-tube being clogged.   Per medical record review patient has a history of DVT, CAD, CHF.  Past Medical History:  Diagnosis Date  . Anxiety   . Arthritis   . Barrett esophagus   . Basal cell carcinoma   . CAD (coronary artery disease)    on 2l home oxygen  . CHF (congestive heart failure) ([place])   . Chronic lower back pain   . COPD (chronic obstructive pulmonary disease) ([place])   . Depression   . DJD (degenerative joint disease)   . DVT (deep venous thrombosis) ([place]) 11/2013; 02/01/2015   on coumadin  . GERD (gastroesophageal reflux disease)   . 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 Heart attack ([place]) 03/20/1995  . History of gout   . Hyperlipidemia   . Hypertension   . Hypothyroidism   . Iron deficiency anemia   . Lumbar spinal stenosis   . Multiple pulmonary nodules   . On home oxygen therapy   . Parkinson's disease ([place])   . Recurrent aspiration pneumonia ([place])   . Seizures ([place])   . Sleep apnea   . Throat cancer ([place])   . Type II diabetes mellitus [facility])     Patient Active Problem  List   Diagnosis Date Noted  . Protein-calorie malnutrition, severe 10/24/2016  . Community acquired pneumonia 10/22/2016  . Elevated troponin 10/22/2016  . Atrial tachycardia ([place]) 10/22/2016  . Hypothyroidism 01/04/2016  . Malnutrition of moderate degree 12/12/2015  . Aspiration pneumonia ([place]) 11/29/2015  . Dehydration 03/07/2015  . Orthostatic hypotension 03/06/2015  . Hypotension 03/06/2015  . Constipation 02/05/2015  . Dysphagia causing pulmonary aspiration with swallowing 02/03/2015  . Diskitis   . 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 Discitis of lumbar region 02/01/2015  . Epidural abscess 02/01/2015  . Warfarin anticoagulation 02/01/2015  . Parkinsons disease ([place]) 02/01/2015  . Vertebral osteomyelitis ([place]) 02/01/2015  . Dysphagia/chronic 02/01/2015  . Bacteremia due to group B Streptococcus 01/28/2015  . Acute on chronic respiratory failure with hypoxia ([place]) 01/26/2015  . Sepsis ([place]) 01/26/2015  . History of DVT (deep vein thrombosis) 02/20/2014  . 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 Multiple pulmonary nodules 01/15/2012  . Aspiration pneumonitis ([place]) 01/15/2012  . Hypertension 01/15/2012  . Barrett's esophagus 01/15/2012  . CAD (coronary artery disease) 01/15/2012  . COPD (chronic obstructive pulmonary disease) ([place]) 01/15/2012    Past Surgical History:  Procedure Laterality Date  . ANTERIOR CERVICAL DECOMP/DISCECTOMY FUSION  X 2  . BACK SURGERY    . BASAL CELL CARCINOMA EXCISION    .  CATARACT EXTRACTION W/ INTRAOCULAR LENS  IMPLANT, BILATERAL Bilateral   . CORONARY ANGIOPLASTY WITH STENT PLACEMENT    . CORONARY ARTERY BYPASS GRAFT  1996   "CABG X3"  . EXCISIONAL HEMORRHOIDECTOMY    . JOINT REPLACEMENT    . PEG PLACEMENT N/A 01/10/2016   Procedure: PERCUTANEOUS ENDOSCOPIC GASTROSTOMY (PEG) PLACEMENT;  Surgeon: Lucilla Lame, MD;  Location: [facility] ENDOSCOPY;  Service: Endoscopy;  Laterality: N/A;  .  POSTERIOR FUSION CERVICAL SPINE  X 1  . RADICAL NECK DISSECTION Right ~ 1998   "throat cancer"  . THYROIDECTOMY  ~ 1996 X 2  . TONSILLECTOMY    . TOTAL KNEE ARTHROPLASTY Right 2011    Prior to Admission medications   Medication Sig Start Date End Date Taking? Authorizing Provider  acetaminophen (TYLENOL) 500 MG tablet Place 1 tablet (500 mg total) into feeding tube every 6 (six) hours as needed. 10/27/16   Nicholes Mango, MD  amiodarone (PACERONE) 200 MG tablet Place 2 tablets (400 mg total) into feeding tube 2 (two) times daily. 10/27/16   Gouru, Illene Silver, MD  ARIPiprazole (ABILIFY) 5 MG tablet Place 1 tablet (5 mg total) into feeding tube 2 (two) times daily. 10/27/16   Nicholes Mango, MD  aspirin 81 MG chewable tablet Place 1 tablet (81 mg total) into feeding tube at bedtime. 10/27/16   Nicholes Mango, MD  atorvastatin (LIPITOR) 40 MG tablet Place 1 tablet (40 mg total) into feeding tube at bedtime. 10/27/16   Nicholes Mango, MD  bisacodyl (DULCOLAX) 10 MG suppository Place 1 suppository (10 mg total) rectally daily as needed for moderate constipation. Patient not taking: Reported on 10/22/2016 02/01/15   Tama High III, MD  bisoprolol (ZEBETA) 10 MG tablet Place 1 tablet (10 mg total) into feeding tube daily. 10/28/16   Gouru, Illene Silver, MD  carbidopa-levodopa (SINEMET IR) 25-250 MG tablet Place 2 tablets into feeding tube 3 (three) times daily. 10/27/16   Gouru, Illene Silver, MD  clonazePAM (KLONOPIN) 1 MG tablet Place 1.5 tablets (1.5 mg total) into feeding tube at bedtime. Take 1 mg in the morning and 1.5 mg at night. 10/27/16   Gouru, Aruna, MD  doxycycline (VIBRA-TABS) 100 MG tablet Place 1 tablet (100 mg total) into feeding tube every 12 (twelve) hours. 10/27/16   Gouru, Illene Silver, MD  feeding supplement, ENSURE ENLIVE, (ENSURE ENLIVE) LIQD Place 237 mLs into feeding tube 5 (five) times daily. 10/27/16   Gouru, Illene Silver, MD  guaiFENesin-dextromethorphan (ROBITUSSIN DM) 100-10 MG/5ML syrup Place 5 mLs into feeding tube  every 8 (eight) hours. 10/27/16   Nicholes Mango, MD  HYDROcodone-acetaminophen (NORCO/VICODIN) 5-325 MG tablet Place 1 tablet into feeding tube at bedtime. 10/27/16   Nicholes Mango, MD  lamoTRIgine (LAMICTAL) 25 MG tablet Place 1 tablet (25 mg total) into feeding tube 2 (two) times daily. 10/27/16   Gouru, Illene Silver, MD  levothyroxine (SYNTHROID, LEVOTHROID) 137 MCG tablet Place 1 tablet (137 mcg total) into feeding tube daily. 10/28/16   Nicholes Mango, MD  losartan (COZAAR) 25 MG tablet Take 25 mg by mouth at bedtime. 01/04/16   [provider]  metFORMIN (GLUCOPHAGE) 500 MG tablet Place 1 tablet (500 mg total) into feeding tube 2 (two) times daily. 10/27/16   Nicholes Mango, MD  nitroGLYCERIN (NITROSTAT) 0.4 MG SL tablet Place 0.4 mg under the tongue every 5 (five) minutes x 3 doses as needed for chest pain.     [provider]  omeprazole (PRILOSEC) 20 MG capsule Take 20 mg by mouth 2 (two) times daily before  a meal.     [provider]  PARoxetine (PAXIL) 20 MG tablet Take 60 mg by mouth daily.  07/30/10   [provider]  polyethylene glycol (MIRALAX / GLYCOLAX) packet Place 17 g into feeding tube daily as needed for mild constipation. 10/27/16   Nicholes Mango, MD  Respiratory Therapy Supplies (FLUTTER) DEVI USE AS DIRECTED 02/21/16   Laverle Hobby, MD  tamsulosin (FLOMAX) 0.4 MG CAPS Take 0.4 mg by mouth daily.     [provider]  warfarin (COUMADIN) 6 MG tablet Place 1 tablet (6 mg total) into feeding tube daily at 6 PM. 10/27/16   Gouru, Illene Silver, MD    Allergies Other and Sulfa antibiotics  Family History  Problem Relation Age of Onset  . Leukemia Paternal Grandmother   . Cancer Maternal Grandmother        stomach  . Cancer Paternal Aunt   . Other Father        bacterial endocarditis  . Rheum arthritis Father     Social History Social History   Tobacco Use  . Smoking status: Former Smoker    Packs/day: 2.00    Years: 35.00    Pack years:  70.00    Types: Cigarettes    Last attempt to quit: 03/20/1995    Years since quitting: 22.3  . Smokeless tobacco: Never Used  Substance Use Topics  . Alcohol use: No    Comment: 02/01/2015 "stopped drinking in ~ 1996; never had problem w/it"  . Drug use: No    Review of Systems Constitutional: Positive for chills. Eyes: No visual changes. ENT: No sore throat. Cardiovascular: Positive chest pain. Respiratory: Positive shortness of breath. Gastrointestinal: No abdominal pain.  No nausea, no vomiting.  No diarrhea.  Concerned that g-tube is clogged. Genitourinary: Negative for dysuria. Musculoskeletal: Negative for back pain. Skin: Negative for rash. Neurological: Negative for headaches, focal weakness or numbness.  ____________________________________________   PHYSICAL EXAM:  VITAL SIGNS: ED Triage Vitals  Enc Vitals Group     BP 07/24/2017 1820 109/68     Pulse Rate 07/18/2017 1820 (!) 101     Resp 08/05/2017 1820 19     Temp 07/27/2017 1821 (!) 100.7 F (38.2 C)     Temp Source 07/13/2017 1821 Oral     SpO2 07/22/2017 1820 92 %     Weight 07/17/2017 1822 137 lb (62.1 kg)     Height 07/24/2017 1822 5\' 9"  (1.753 m)     Head Circumference --      Peak Flow --      Pain Score 07/10/2017 1822 7   Constitutional: Alert and oriented.  Eyes: Conjunctivae are normal.  ENT   Head: Normocephalic and atraumatic.   Nose: No congestion/rhinnorhea.   Mouth/Throat: Mucous membranes are moist.   Neck: No stridor. Hematological/Lymphatic/Immunilogical: No cervical lymphadenopathy. Cardiovascular: Tachycardic, regular rhythm.  No murmurs, rubs, or gallops.  Respiratory: Mildly increased respiratory effort. Question some rhonchi in bases.  Gastrointestinal: Soft and non tender. No rebound. No guarding.  Genitourinary: Deferred Musculoskeletal: Normal range of motion in all extremities. No lower extremity edema. Neurologic:  Normal speech and language. No gross focal neurologic deficits  are appreciated.  Tremors present. Skin:  Skin is warm, dry and intact. No rash noted. Psychiatric: Mood and affect are normal. Speech and behavior are normal. Patient exhibits appropriate insight and judgment.  ____________________________________________    LABS (pertinent positives/negatives)  Trop 0.03 BMP k 4.6, cr 0.73, glu 112 CBC wbc 6.0, hgb 8.2,  plt 100 Influenza negative Lactic 0.6 ____________________________________________   EKG  I, Nance Pear, attending physician, personally viewed and interpreted this EKG  EKG Time: 1817 Rate: 101 Rhythm: atrial flutter Axis: right axis deviation Intervals: qtc 418 QRS: narrow ST changes: no st elevation Impression: abnormal ekg  ____________________________________________    RADIOLOGY  CXR Small pleural effusion with atelectasis  CT angio No PE, concern for infection   ____________________________________________   PROCEDURES  Procedures  ____________________________________________   INITIAL IMPRESSION / ASSESSMENT AND PLAN / ED COURSE  Pertinent labs & imaging results that were available during my care of the patient were reviewed by me and considered in my medical decision making (see chart for details).  Presented to the emergency department today because of concerns for shortness of breath.  Patient was found to be febrile upon initial presentation to the emergency department.  Workup would be broad including influenza, pneumonia, upper respiratory illness, pleural effusion, pneumothorax.  Given that patient has history of DVT pulmonary embolism was also considered.  CT Angie was obtained which did not show any signs of pulmonary embolism.  Workup was consistent with a pneumonia.  Will plan on admission.  Will plan on IV antibiotics.  Discussed findings and plan with patient.   ____________________________________________   FINAL CLINICAL IMPRESSION(S) / ED DIAGNOSES  Shortness of  breath Pneumonia  Note: This dictation was prepared with Dragon dictation. Any transcriptional errors that result from this process are unintentional     Nance Pear, MD 07/29/17 1715

## 2017-07-28 NOTE — ED Notes (Signed)
Pt brought in via ems from home with a cough and sob.  Sx for 2 days.  Pt has a feeding tube which he says is clogged for 1 day.  No n/v/d.  No abd pain.  nonproductive cough.  Pt has iv in left forearm.  Pt alert.  Speech clear.  Sinus tach on monitor at 102.  Skin warm and dry.

## 2017-07-28 NOTE — ED Notes (Signed)
Pt waiting on admission.  Pt alert.  Sinus on monitor.   Family at bedside.  meds infusing.

## 2017-07-28 NOTE — ED Notes (Signed)
Patient transported to CT 

## 2017-07-28 NOTE — ED Notes (Signed)
sats 89-90% on o2 at 2l, increased up to 4l per [facility], EDP made aware.

## 2017-07-29 ENCOUNTER — Other Ambulatory Visit: Payer: Self-pay

## 2017-07-29 ENCOUNTER — Inpatient Hospital Stay: Payer: Medicare Other

## 2017-07-29 DIAGNOSIS — I110 Hypertensive heart disease with heart failure: Secondary | ICD-10-CM | POA: Diagnosis present

## 2017-07-29 DIAGNOSIS — Z515 Encounter for palliative care: Secondary | ICD-10-CM

## 2017-07-29 DIAGNOSIS — R0602 Shortness of breath: Secondary | ICD-10-CM | POA: Diagnosis present

## 2017-07-29 DIAGNOSIS — J80 Acute respiratory distress syndrome: Secondary | ICD-10-CM

## 2017-07-29 DIAGNOSIS — K219 Gastro-esophageal reflux disease without esophagitis: Secondary | ICD-10-CM | POA: Diagnosis present

## 2017-07-29 DIAGNOSIS — Z931 Gastrostomy status: Secondary | ICD-10-CM | POA: Diagnosis not present

## 2017-07-29 DIAGNOSIS — J690 Pneumonitis due to inhalation of food and vomit: Secondary | ICD-10-CM | POA: Diagnosis present

## 2017-07-29 DIAGNOSIS — I5022 Chronic systolic (congestive) heart failure: Secondary | ICD-10-CM | POA: Diagnosis present

## 2017-07-29 DIAGNOSIS — G20 Parkinson's disease: Secondary | ICD-10-CM | POA: Diagnosis present

## 2017-07-29 DIAGNOSIS — J449 Chronic obstructive pulmonary disease, unspecified: Secondary | ICD-10-CM | POA: Diagnosis present

## 2017-07-29 DIAGNOSIS — E119 Type 2 diabetes mellitus without complications: Secondary | ICD-10-CM | POA: Diagnosis present

## 2017-07-29 DIAGNOSIS — I959 Hypotension, unspecified: Secondary | ICD-10-CM | POA: Diagnosis not present

## 2017-07-29 DIAGNOSIS — K2270 Barrett's esophagus without dysplasia: Secondary | ICD-10-CM | POA: Diagnosis present

## 2017-07-29 DIAGNOSIS — Z7189 Other specified counseling: Secondary | ICD-10-CM

## 2017-07-29 DIAGNOSIS — J9601 Acute respiratory failure with hypoxia: Secondary | ICD-10-CM | POA: Diagnosis not present

## 2017-07-29 DIAGNOSIS — Z66 Do not resuscitate: Secondary | ICD-10-CM | POA: Diagnosis not present

## 2017-07-29 DIAGNOSIS — R64 Cachexia: Secondary | ICD-10-CM | POA: Diagnosis present

## 2017-07-29 DIAGNOSIS — I2510 Atherosclerotic heart disease of native coronary artery without angina pectoris: Secondary | ICD-10-CM | POA: Diagnosis present

## 2017-07-29 DIAGNOSIS — E43 Unspecified severe protein-calorie malnutrition: Secondary | ICD-10-CM | POA: Diagnosis present

## 2017-07-29 DIAGNOSIS — J9602 Acute respiratory failure with hypercapnia: Secondary | ICD-10-CM | POA: Diagnosis present

## 2017-07-29 DIAGNOSIS — Z01818 Encounter for other preprocedural examination: Secondary | ICD-10-CM

## 2017-07-29 DIAGNOSIS — Z9981 Dependence on supplemental oxygen: Secondary | ICD-10-CM | POA: Diagnosis not present

## 2017-07-29 DIAGNOSIS — F329 Major depressive disorder, single episode, unspecified: Secondary | ICD-10-CM | POA: Diagnosis present

## 2017-07-29 DIAGNOSIS — F444 Conversion disorder with motor symptom or deficit: Secondary | ICD-10-CM | POA: Diagnosis present

## 2017-07-29 DIAGNOSIS — Z681 Body mass index (BMI) 19 or less, adult: Secondary | ICD-10-CM | POA: Diagnosis not present

## 2017-07-29 DIAGNOSIS — I4892 Unspecified atrial flutter: Secondary | ICD-10-CM | POA: Diagnosis present

## 2017-07-29 DIAGNOSIS — E785 Hyperlipidemia, unspecified: Secondary | ICD-10-CM | POA: Diagnosis present

## 2017-07-29 DIAGNOSIS — E872 Acidosis: Secondary | ICD-10-CM | POA: Diagnosis present

## 2017-07-29 LAB — BLOOD GAS, ARTERIAL
Acid-Base Excess: 3.2 mmol/L — ABNORMAL HIGH (ref 0.0–2.0)
Bicarbonate: 30.6 mmol/L — ABNORMAL HIGH (ref 20.0–28.0)
FIO2: 1
O2 Saturation: 97.9 %
PCO2 ART: 58 mmHg — AB (ref 32.0–48.0)
PH ART: 7.33 — AB (ref 7.350–7.450)
PO2 ART: 109 mmHg — AB (ref 83.0–108.0)
Patient temperature: 37

## 2017-07-29 LAB — PROTIME-INR
INR: 2.83
Prothrombin Time: 29.5 seconds — ABNORMAL HIGH (ref 11.4–15.2)

## 2017-07-29 LAB — LACTIC ACID, PLASMA: Lactic Acid, Venous: 0.9 mmol/L (ref 0.5–1.9)

## 2017-07-29 LAB — GLUCOSE, CAPILLARY
GLUCOSE-CAPILLARY: 102 mg/dL — AB (ref 65–99)
GLUCOSE-CAPILLARY: 91 mg/dL (ref 65–99)
Glucose-Capillary: 70 mg/dL (ref 65–99)
Glucose-Capillary: 115 mg/dL — ABNORMAL HIGH (ref 65–99)
Glucose-Capillary: 76 mg/dL (ref 65–99)
Glucose-Capillary: 66 mg/dL (ref 65–99)

## 2017-07-29 LAB — MRSA PCR SCREENING: MRSA BY PCR: NEGATIVE

## 2017-07-29 LAB — PHOSPHORUS
PHOSPHORUS: 3 mg/dL (ref 2.5–4.6)
PHOSPHORUS: 2.8 mg/dL (ref 2.5–4.6)

## 2017-07-29 LAB — MAGNESIUM
MAGNESIUM: 1.7 mg/dL (ref 1.7–2.4)
Magnesium: 1.8 mg/dL (ref 1.7–2.4)

## 2017-07-29 MED ORDER — PANTOPRAZOLE SODIUM 40 MG PO TBEC
40.0000 mg | DELAYED_RELEASE_TABLET | Freq: Every day | ORAL | Status: DC
  Administered 2017-07-29: 40 mg via ORAL
  Filled 2017-07-29: qty 1

## 2017-07-29 MED ORDER — AMIODARONE HCL IN DEXTROSE 360-4.14 MG/200ML-% IV SOLN
INTRAVENOUS | Status: AC
  Filled 2017-07-29: qty 200

## 2017-07-29 MED ORDER — FENTANYL CITRATE (PF) 100 MCG/2ML IJ SOLN
200.0000 ug | Freq: Once | INTRAMUSCULAR | Status: AC
  Administered 2017-07-29: 200 ug via INTRAVENOUS

## 2017-07-29 MED ORDER — VITAL 1.5 CAL PO LIQD
1000.0000 mL | ORAL | Status: DC
  Administered 2017-07-29: 1000 mL

## 2017-07-29 MED ORDER — ACETAMINOPHEN 500 MG PO TABS: 500.0000 mg | ORAL_TABLET | Freq: Four times a day (QID) | ORAL | Status: DC | PRN

## 2017-07-29 MED ORDER — POLYETHYLENE GLYCOL 3350 17 G PO PACK: 17.0000 g | PACK | Freq: Every day | ORAL | Status: DC | PRN

## 2017-07-29 MED ORDER — PAROXETINE HCL 30 MG PO TABS
60.0000 mg | ORAL_TABLET | Freq: Every day | ORAL | Status: DC
  Administered 2017-07-29 – 2017-07-30 (×2): 60 mg via ORAL
  Filled 2017-07-29 (×2): qty 2

## 2017-07-29 MED ORDER — SODIUM CHLORIDE 0.9 % IV BOLUS (SEPSIS)
2000.0000 mL | Freq: Once | INTRAVENOUS | Status: AC
  Administered 2017-07-29: 2000 mL via INTRAVENOUS

## 2017-07-29 MED ORDER — ONDANSETRON HCL 4 MG/2ML IJ SOLN
4.0000 mg | Freq: Four times a day (QID) | INTRAMUSCULAR | Status: DC | PRN
Start: 2017-07-29 — End: 2017-07-31

## 2017-07-29 MED ORDER — CLONAZEPAM 0.5 MG PO TABS
1.5000 mg | ORAL_TABLET | Freq: Every day | ORAL | Status: DC
  Administered 2017-07-29 (×2): 1.5 mg
  Filled 2017-07-29 (×2): qty 1

## 2017-07-29 MED ORDER — ASPIRIN 81 MG PO CHEW
81.0000 mg | CHEWABLE_TABLET | Freq: Every day | ORAL | Status: DC
  Administered 2017-07-29 (×2): 81 mg
  Filled 2017-07-29 (×2): qty 1

## 2017-07-29 MED ORDER — CARBIDOPA-LEVODOPA 25-250 MG PO TABS
2.0000 | ORAL_TABLET | Freq: Three times a day (TID) | ORAL | Status: DC
  Administered 2017-07-29 – 2017-07-30 (×4): 2
  Filled 2017-07-29 (×6): qty 2

## 2017-07-29 MED ORDER — NITROGLYCERIN 0.4 MG SL SUBL: 0.4000 mg | SUBLINGUAL_TABLET | SUBLINGUAL | Status: DC | PRN

## 2017-07-29 MED ORDER — SODIUM CHLORIDE 0.9 % IV SOLN
0.0000 ug/min | INTRAVENOUS | Status: DC
  Administered 2017-07-29: 15 ug/min via INTRAVENOUS
  Administered 2017-07-29: 50 ug/min via INTRAVENOUS
  Administered 2017-07-30: 9 ug/min via INTRAVENOUS
  Filled 2017-07-29 (×3): qty 10

## 2017-07-29 MED ORDER — PANTOPRAZOLE SODIUM 40 MG IV SOLR
40.0000 mg | INTRAVENOUS | Status: DC
  Administered 2017-07-30: 40 mg via INTRAVENOUS
  Filled 2017-07-29: qty 40

## 2017-07-29 MED ORDER — METFORMIN HCL 500 MG PO TABS
500.0000 mg | ORAL_TABLET | Freq: Two times a day (BID) | ORAL | Status: DC
  Administered 2017-07-29: 500 mg
  Filled 2017-07-29 (×3): qty 1

## 2017-07-29 MED ORDER — WARFARIN SODIUM 6 MG PO TABS: 6.0000 mg | ORAL_TABLET | Freq: Every day | ORAL | Status: DC

## 2017-07-29 MED ORDER — STERILE WATER FOR INJECTION IJ SOLN
10.0000 mL | Freq: Once | INTRAMUSCULAR | Status: AC
  Administered 2017-07-29: 10 mL via INTRAMUSCULAR

## 2017-07-29 MED ORDER — HYDROCODONE-ACETAMINOPHEN 5-325 MG PO TABS
1.0000 | ORAL_TABLET | Freq: Every day | ORAL | Status: DC
  Administered 2017-07-29 (×2): 1
  Filled 2017-07-29 (×2): qty 1

## 2017-07-29 MED ORDER — SODIUM CHLORIDE 0.9 % IV SOLN
3.0000 g | Freq: Four times a day (QID) | INTRAVENOUS | Status: DC
  Administered 2017-07-29 – 2017-07-30 (×6): 3 g via INTRAVENOUS
  Filled 2017-07-29 (×12): qty 3

## 2017-07-29 MED ORDER — ENSURE ENLIVE PO LIQD: 237.0000 mL | Freq: Every day | ORAL | Status: DC

## 2017-07-29 MED ORDER — DEXTROSE 50 % IV SOLN
25.0000 mL | Freq: Once | INTRAVENOUS | Status: AC
  Administered 2017-07-29: 25 mL via INTRAVENOUS
  Filled 2017-07-29: qty 50

## 2017-07-29 MED ORDER — LEVOTHYROXINE SODIUM 137 MCG PO TABS
137.0000 ug | ORAL_TABLET | Freq: Every day | ORAL | Status: DC
  Administered 2017-07-29 – 2017-07-30 (×2): 137 ug
  Filled 2017-07-29 (×2): qty 1

## 2017-07-29 MED ORDER — FENTANYL 2500MCG IN NS 250ML (10MCG/ML) PREMIX INFUSION
0.0000 ug/h | INTRAVENOUS | Status: DC
Start: 2017-07-29 — End: 2017-07-30
  Administered 2017-07-29: 50 ug/h via INTRAVENOUS
  Administered 2017-07-30: 125 ug/h via INTRAVENOUS
  Filled 2017-07-29: qty 250

## 2017-07-29 MED ORDER — SODIUM CHLORIDE 0.9 % IV SOLN
Freq: Once | INTRAVENOUS | Status: AC
  Administered 2017-07-29: 02:00:00 via INTRAVENOUS

## 2017-07-29 MED ORDER — VITAL HIGH PROTEIN PO LIQD: 1000.0000 mL | ORAL | Status: DC

## 2017-07-29 MED ORDER — MIDAZOLAM HCL 2 MG/2ML IJ SOLN
4.0000 mg | Freq: Once | INTRAMUSCULAR | Status: AC
  Administered 2017-07-29: 4 mg via INTRAVENOUS

## 2017-07-29 MED ORDER — AMIODARONE HCL 200 MG PO TABS
400.0000 mg | ORAL_TABLET | Freq: Two times a day (BID) | ORAL | Status: DC
  Administered 2017-07-29 – 2017-07-30 (×4): 400 mg
  Filled 2017-07-29 (×4): qty 2

## 2017-07-29 MED ORDER — VANCOMYCIN HCL IN DEXTROSE 750-5 MG/150ML-% IV SOLN
750.0000 mg | Freq: Two times a day (BID) | INTRAVENOUS | Status: DC
  Administered 2017-07-29: 750 mg via INTRAVENOUS
  Filled 2017-07-29 (×2): qty 150

## 2017-07-29 MED ORDER — HEPARIN SODIUM (PORCINE) 5000 UNIT/ML IJ SOLN: 5000.0000 [IU] | Freq: Three times a day (TID) | INTRAMUSCULAR | Status: DC

## 2017-07-29 MED ORDER — ATORVASTATIN CALCIUM 20 MG PO TABS
40.0000 mg | ORAL_TABLET | Freq: Every day | ORAL | Status: DC
  Administered 2017-07-29: 40 mg
  Filled 2017-07-29: qty 2

## 2017-07-29 MED ORDER — LAMOTRIGINE 25 MG PO TABS
25.0000 mg | ORAL_TABLET | Freq: Two times a day (BID) | ORAL | Status: DC
  Administered 2017-07-29 – 2017-07-30 (×4): 25 mg
  Filled 2017-07-29 (×4): qty 1

## 2017-07-29 MED ORDER — ARIPIPRAZOLE 5 MG PO TABS
5.0000 mg | ORAL_TABLET | Freq: Two times a day (BID) | ORAL | Status: DC
  Administered 2017-07-29 – 2017-07-30 (×4): 5 mg
  Filled 2017-07-29 (×5): qty 1

## 2017-07-29 MED ORDER — DEXTROSE 5 % IV SOLN: 1.0000 g | Freq: Three times a day (TID) | INTRAVENOUS | Status: DC

## 2017-07-29 MED ORDER — VECURONIUM BROMIDE 10 MG IV SOLR
10.0000 mg | Freq: Once | INTRAVENOUS | Status: AC
  Administered 2017-07-29: 10 mg via INTRAVENOUS

## 2017-07-29 MED ORDER — FENTANYL 2500MCG IN NS 250ML (10MCG/ML) PREMIX INFUSION
INTRAVENOUS | Status: AC
  Filled 2017-07-29: qty 250

## 2017-07-29 MED ORDER — TAMSULOSIN HCL 0.4 MG PO CAPS
0.4000 mg | ORAL_CAPSULE | Freq: Every day | ORAL | Status: DC
  Administered 2017-07-29 – 2017-07-30 (×2): 0.4 mg via ORAL
  Filled 2017-07-29 (×2): qty 1

## 2017-07-29 MED ORDER — WARFARIN - PHARMACIST DOSING INPATIENT: Freq: Every day | Status: DC

## 2017-07-29 MED ORDER — BISACODYL 10 MG RE SUPP
10.0000 mg | Freq: Every day | RECTAL | Status: DC | PRN
Start: 2017-07-29 — End: 2017-07-30

## 2017-07-29 MED ORDER — PRO-STAT SUGAR FREE PO LIQD
30.0000 mL | Freq: Every day | ORAL | Status: DC
  Administered 2017-07-29 – 2017-07-30 (×2): 30 mL

## 2017-07-29 MED ORDER — BISOPROLOL FUMARATE 5 MG PO TABS
10.0000 mg | ORAL_TABLET | Freq: Every day | ORAL | Status: DC
  Administered 2017-07-29: 10 mg
  Filled 2017-07-29 (×2): qty 2

## 2017-07-29 MED ORDER — MAGNESIUM SULFATE IN D5W 1-5 GM/100ML-% IV SOLN
1.0000 g | Freq: Once | INTRAVENOUS | Status: AC
  Administered 2017-07-29: 1 g via INTRAVENOUS
  Filled 2017-07-29: qty 100

## 2017-07-29 MED ORDER — DEXTROSE 50 % IV SOLN
INTRAVENOUS | Status: AC
  Administered 2017-07-29: 09:00:00
  Filled 2017-07-29: qty 50

## 2017-07-29 MED ORDER — WARFARIN SODIUM 3 MG PO TABS
3.0000 mg | ORAL_TABLET | Freq: Once | ORAL | Status: AC
  Administered 2017-07-29: 3 mg
  Filled 2017-07-29: qty 1

## 2017-07-29 MED ORDER — AMIODARONE IV BOLUS ONLY 150 MG/100ML: 150.0000 mg | Freq: Once | INTRAVENOUS | Status: DC

## 2017-07-29 MED ORDER — GUAIFENESIN-DM 100-10 MG/5ML PO SYRP
5.0000 mL | ORAL_SOLUTION | Freq: Three times a day (TID) | ORAL | Status: DC
  Administered 2017-07-29 – 2017-07-30 (×5): 5 mL
  Filled 2017-07-29 (×8): qty 5

## 2017-07-29 MED ORDER — LOSARTAN POTASSIUM 25 MG PO TABS
25.0000 mg | ORAL_TABLET | Freq: Every day | ORAL | Status: DC
  Administered 2017-07-29 (×2): 25 mg via ORAL
  Filled 2017-07-29 (×2): qty 1

## 2017-07-29 NOTE — H&P (Signed)
[facility] NAME: John Mendoza    MR#:  [id number]  DATE OF BIRTH:  [birth year]  DATE OF ADMISSION:  07/16/2017  PRIMARY CARE PHYSICIAN: Adin Hector, MD   REQUESTING/REFERRING PHYSICIAN:   CHIEF COMPLAINT:   Chief Complaint  Patient presents with  . Shortness of Breath    HISTORY OF PRESENT ILLNESS: John Mendoza  is a 72 y.o. male with extensive medical history, including COPD on 2 L continuous home oxygen, coronary artery disease, CHF, Barrett's esophagus, on G-tube feeds, Parkinson's disease, Goodpasture's syndrome, throat cancer, status post treatment 20 years ago and recurrent aspiration pneumonias.  He is on chronic anticoagulation with Coumadin. Patient is bedridden at baseline.  He lives at home with his wife, who takes care of him. Patient was brought to emergency room via EMS for acute onset of shortness of breath and productive cough going on for the past 2 days.  He also requires more oxygen, 3 L per nasal cannula to maintain oxygen saturation at 93%. Patient had fever, up to 101 and chills at home; no nausea/vomiting.  No chest pain, no bleeding.  Over-the-counter medications do not help with his symptoms.  CT of the chest, done in the emergency room, confirmed aspiration pneumonia. Patient is admitted for further evaluation and treatment.   PAST MEDICAL HISTORY:   Past Medical History:  Diagnosis Date  . Anxiety   . Arthritis   . Barrett esophagus   . Basal cell carcinoma   . CAD (coronary artery disease)    on 2l home oxygen  . CHF (congestive heart failure) ([place])   . Chronic lower back pain   . COPD (chronic obstructive pulmonary disease) ([place])   . Depression   . DJD (degenerative joint disease)   . DVT (deep venous thrombosis) ([place]) 11/2013; 02/01/2015   on coumadin  . GERD (gastroesophageal reflux disease)   . 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  Heart attack ([place]) 03/20/1995  . History of gout   . Hyperlipidemia   . Hypertension   . Hypothyroidism   . Iron deficiency anemia   . Lumbar spinal stenosis   . Multiple pulmonary nodules   . On home oxygen therapy   . Parkinson's disease ([place])   . Recurrent aspiration pneumonia ([place])   . Seizures ([place])   . Sleep apnea   . Throat cancer ([place])   . Type II diabetes mellitus ([place])     PAST SURGICAL HISTORY:  Past Surgical History:  Procedure Laterality Date  . ANTERIOR CERVICAL DECOMP/DISCECTOMY FUSION  X 2  . BACK SURGERY    . BASAL CELL CARCINOMA EXCISION    . CATARACT EXTRACTION W/ INTRAOCULAR LENS  IMPLANT, BILATERAL Bilateral   . CORONARY ANGIOPLASTY WITH STENT PLACEMENT    . CORONARY ARTERY BYPASS GRAFT  1996   "CABG X3"  . EXCISIONAL HEMORRHOIDECTOMY    . JOINT REPLACEMENT    . PEG PLACEMENT N/A 01/10/2016   Procedure: PERCUTANEOUS ENDOSCOPIC GASTROSTOMY (PEG) PLACEMENT;  Surgeon: Lucilla Lame, MD;  Location: [facility] ENDOSCOPY;  Service: Endoscopy;  Laterality: N/A;  . POSTERIOR FUSION CERVICAL SPINE  X 1  . RADICAL NECK DISSECTION Right ~ 1998   "throat cancer"  . THYROIDECTOMY  ~ 1996 X 2  . TONSILLECTOMY    . TOTAL KNEE ARTHROPLASTY Right 2011    SOCIAL HISTORY:  Social History   Tobacco Use  .  Smoking status: Former Smoker    Packs/day: 2.00    Years: 35.00    Pack years: 70.00    Types: Cigarettes    Last attempt to quit: 03/20/1995    Years since quitting: 22.3  . Smokeless tobacco: Never Used  Substance Use Topics  . Alcohol use: No    Comment: 02/01/2015 "stopped drinking in ~ 1996; never had problem w/it"    FAMILY HISTORY:  Family History  Problem Relation Age of Onset  . Leukemia Paternal Grandmother   . Cancer Maternal Grandmother        stomach  . Cancer Paternal Aunt   . Other Father        bacterial endocarditis  . Rheum arthritis Father     DRUG ALLERGIES:  Allergies  Allergen Reactions  . Other Other (See Comments)    Dizziness,  lightheadedness, Pt states that inhaled medications make him depressed and have negative thoughts.    . Sulfa Antibiotics Rash    Headache    REVIEW OF SYSTEMS:   CONSTITUTIONAL: Positive for fever/chills, fatigue and generalized weakness.  EYES: No blurred or double vision.  EARS, NOSE, AND THROAT: No tinnitus or ear pain.  RESPIRATORY: Positive for productive cough, shortness of breath, wheezing.  No hemoptysis.  CARDIOVASCULAR: No chest pain, orthopnea, edema.  GASTROINTESTINAL: No nausea, vomiting, diarrhea or abdominal pain.  GENITOURINARY: No dysuria, hematuria.  ENDOCRINE: No polyuria, nocturia,  HEMATOLOGY: Positive for easy bruising, secondary to Coumadin use SKIN: No rash or lesion. MUSCULOSKELETAL: Positive for myalgias and osteoarthritis NEUROLOGIC: No focal weakness.  PSYCHIATRY: Positive history of anxiety and depression.   MEDICATIONS AT HOME:  Prior to Admission medications   Medication Sig Start Date End Date Taking? Authorizing Provider  acetaminophen (TYLENOL) 500 MG tablet Place 1 tablet (500 mg total) into feeding tube every 6 (six) hours as needed. 10/27/16   Nicholes Mango, MD  amiodarone (PACERONE) 200 MG tablet Place 2 tablets (400 mg total) into feeding tube 2 (two) times daily. 10/27/16   Gouru, Illene Silver, MD  ARIPiprazole (ABILIFY) 5 MG tablet Place 1 tablet (5 mg total) into feeding tube 2 (two) times daily. 10/27/16   Nicholes Mango, MD  aspirin 81 MG chewable tablet Place 1 tablet (81 mg total) into feeding tube at bedtime. 10/27/16   Nicholes Mango, MD  atorvastatin (LIPITOR) 40 MG tablet Place 1 tablet (40 mg total) into feeding tube at bedtime. Patient not taking: Reported on 07/29/2017 10/27/16   Nicholes Mango, MD  bisacodyl (DULCOLAX) 10 MG suppository Place 1 suppository (10 mg total) rectally daily as needed for moderate constipation. Patient not taking: Reported on 10/22/2016 02/01/15   Tama High III, MD  bisoprolol (ZEBETA) 10 MG tablet Place 1 tablet (10 mg  total) into feeding tube daily. 10/28/16   Gouru, Illene Silver, MD  carbidopa-levodopa (SINEMET IR) 25-250 MG tablet Place 2 tablets into feeding tube 3 (three) times daily. 10/27/16   Gouru, Illene Silver, MD  clonazePAM (KLONOPIN) 1 MG tablet Place 1.5 tablets (1.5 mg total) into feeding tube at bedtime. Take 1 mg in the morning and 1.5 mg at night. 10/27/16   Gouru, Aruna, MD  doxycycline (VIBRA-TABS) 100 MG tablet Place 1 tablet (100 mg total) into feeding tube every 12 (twelve) hours. 10/27/16   Gouru, Illene Silver, MD  feeding supplement, ENSURE ENLIVE, (ENSURE ENLIVE) LIQD Place 237 mLs into feeding tube 5 (five) times daily. 10/27/16   Gouru, Illene Silver, MD  guaiFENesin-dextromethorphan (ROBITUSSIN DM) 100-10 MG/5ML syrup Place 5 mLs into  feeding tube every 8 (eight) hours. 10/27/16   Nicholes Mango, MD  HYDROcodone-acetaminophen (NORCO/VICODIN) 5-325 MG tablet Place 1 tablet into feeding tube at bedtime. 10/27/16   Nicholes Mango, MD  lamoTRIgine (LAMICTAL) 25 MG tablet Place 1 tablet (25 mg total) into feeding tube 2 (two) times daily. 10/27/16   Gouru, Illene Silver, MD  levothyroxine (SYNTHROID, LEVOTHROID) 137 MCG tablet Place 1 tablet (137 mcg total) into feeding tube daily. 10/28/16   Nicholes Mango, MD  losartan (COZAAR) 25 MG tablet Take 25 mg by mouth at bedtime. 01/04/16   [provider]  metFORMIN (GLUCOPHAGE) 500 MG tablet Place 1 tablet (500 mg total) into feeding tube 2 (two) times daily. Patient not taking: Reported on 07/29/2017 10/27/16   Nicholes Mango, MD  nitroGLYCERIN (NITROSTAT) 0.4 MG SL tablet Place 0.4 mg under the tongue every 5 (five) minutes x 3 doses as needed for chest pain.     [provider]  omeprazole (PRILOSEC) 20 MG capsule Take 20 mg by mouth 2 (two) times daily before a meal.     [provider]  PARoxetine (PAXIL) 20 MG tablet Take 60 mg by mouth daily.  07/30/10   [provider]  polyethylene glycol (MIRALAX / GLYCOLAX) packet Place 17 g into feeding tube daily as  needed for mild constipation. 10/27/16   Nicholes Mango, MD  Respiratory Therapy Supplies (FLUTTER) DEVI USE AS DIRECTED 02/21/16   Laverle Hobby, MD  tamsulosin (FLOMAX) 0.4 MG CAPS Take 0.4 mg by mouth daily.     [provider]  warfarin (COUMADIN) 6 MG tablet Place 1 tablet (6 mg total) into feeding tube daily at 6 PM. 10/27/16   Gouru, Aruna, MD      PHYSICAL EXAMINATION:   VITAL SIGNS: Blood pressure 124/79, pulse 98, temperature 98.8 F (37.1 C), temperature source Oral, resp. rate (!) 24, height 5\' 9"  (1.753 m), weight 57.8 kg (127 lb 6.4 oz), SpO2 90 %.  GENERAL:  72 y.o.-year-old patient lying in the bed in mild distress, secondary to productive cough and generalized weakness.  EYES: Pupils equal, round, reactive to light and accommodation. No scleral icterus. Extraocular muscles intact.  HEENT: Head atraumatic, normocephalic. Oropharynx and nasopharynx clear.  NECK:  Supple, no jugular venous distention. No thyroid enlargement, no tenderness.  LUNGS: Reduced breath sounds and scattered wheezing are noted bilaterally.  No use of accessory muscles of respiration.  CARDIOVASCULAR: S1, S2 normal. No S3/S4.  ABDOMEN: Soft, nontender, nondistended. Bowel sounds present. No organomegaly or mass. G-tube in place. EXTREMITIES: No pedal edema, cyanosis, or clubbing.  NEUROLOGIC: Bilateral lower extremities weakness, chronic, as the patient has been bedridden for the past few years; gait not checked.  PSYCHIATRIC: The patient is alert and oriented x 3.  SKIN: No obvious rash, lesion, or ulcer.   LABORATORY PANEL:   CBC Recent Labs  Lab 07/15/2017 1810  WBC 6.0  HGB 8.2*  HCT 25.2*  PLT 100*  MCV 92.6  MCH 29.9  MCHC 32.3  RDW 14.5  LYMPHSABS 0.2*  MONOABS 0.4  EOSABS 0.0  BASOSABS 0.0   ------------------------------------------------------------------------------------------------------------------  Chemistries  Recent Labs  Lab 07/26/2017 1810  NA 138  K  4.6  CL 97*  CO2 34*  GLUCOSE 112*  BUN 24*  CREATININE 0.73  CALCIUM 9.3   ------------------------------------------------------------------------------------------------------------------ estimated creatinine clearance is 68.2 mL/min (by C-G formula based on SCr of 0.73 mg/dL). ------------------------------------------------------------------------------------------------------------------ No results for input(s): TSH, T4TOTAL, T3FREE, THYROIDAB in the last 72 hours.  Invalid input(s):  FREET3   Coagulation profile No results for input(s): INR, PROTIME in the last 168 hours. ------------------------------------------------------------------------------------------------------------------- No results for input(s): DDIMER in the last 72 hours. -------------------------------------------------------------------------------------------------------------------  Cardiac Enzymes Recent Labs  Lab 08/02/2017 1810  TROPONINI 0.03*   ------------------------------------------------------------------------------------------------------------------ Invalid input(s): POCBNP  ---------------------------------------------------------------------------------------------------------------  Urinalysis    Component Value Date/Time   COLORURINE AMBER (A) 01/12/2016 1607   APPEARANCEUR CLEAR (A) 01/12/2016 1607   APPEARANCEUR Clear 01/17/2014 1102   LABSPEC >1.060 (H) 01/12/2016 1607   LABSPEC 1.008 01/17/2014 1102   PHURINE 5.0 01/12/2016 1607   GLUCOSEU NEGATIVE 01/12/2016 1607   GLUCOSEU Negative 01/17/2014 1102   HGBUR NEGATIVE 01/12/2016 1607   BILIRUBINUR NEGATIVE 01/12/2016 1607   BILIRUBINUR Negative 01/17/2014 1102   KETONESUR TRACE (A) 01/12/2016 1607   PROTEINUR 30 (A) 01/12/2016 1607   NITRITE NEGATIVE 01/12/2016 1607   LEUKOCYTESUR NEGATIVE 01/12/2016 1607   LEUKOCYTESUR Negative 01/17/2014 1102     RADIOLOGY: Dg Chest 2 View  Result Date: 07/15/2017 CLINICAL DATA:   Cough and dyspnea with intermittent chest pain. EXAM: CHEST  2 VIEW COMPARISON:  10/26/2016 FINDINGS: Stable cardiomegaly with aortic atherosclerosis and post CABG change. Small left pleural effusion blunting the posterior costophrenic angle with associated compressive and subsegmental atelectasis. No overt pulmonary edema. Minimal scarring in the left upper lobe. No acute nor suspicious osseous abnormality. Thoracic spondylosis. IMPRESSION: 1. Stable cardiomegaly with aortic atherosclerosis and post CABG change. 2. Small left pleural effusion with atelectasis. Electronically Signed   By: Ashley Royalty M.D.   On: 07/08/2017 18:58   Ct Angio Chest Pe W And/or Wo Contrast  Result Date: 07/26/2017 CLINICAL DATA:  72 year old with cough and shortness of breath. EXAM: CT ANGIOGRAPHY CHEST WITH CONTRAST TECHNIQUE: Multidetector CT imaging of the chest was performed using the standard protocol during bolus administration of intravenous contrast. Multiplanar CT image reconstructions and MIPs were obtained to evaluate the vascular anatomy. CONTRAST:  44mL ISOVUE-370 IOPAMIDOL (ISOVUE-370) INJECTION 76% COMPARISON:  Chest CT 01/12/2016.  Chest radiograph 07/28/2016 FINDINGS: Cardiovascular: No evidence for a large or central pulmonary embolism. Poor opacification of the segmental branches in the left lower lobe but pulmonary embolism in this area is thought to be unlikely. Atherosclerotic calcifications in the thoracic aorta. Coronary arteries are heavily calcified. Post CABG procedure. Heart is enlarged without significant pericardial fluid. Mediastinum/Nodes: Few prominent lymph nodes in the mediastinum appear unchanged. No significant axillary lymphadenopathy. No significant hilar lymphadenopathy. Lungs/Pleura: Small left pleural effusion. Centrilobular emphysema. Trachea is patent but there is occlusion or narrowing of the distal left mainstem bronchus. Right upper lobe bronchus is patent but there are endobronchial  filling defects within the bronchus intermedius extending into the right lobe and right middle lobe. There is chronic consolidation in the left lower lobe with a pocket of gas or air-filled lung in the posterior left lung base. This pocket of gas has slightly enlarged, measuring up to 4.8 cm and previously measuring 2.9 cm. These findings have not significantly changed since 2017. Patchy parenchymal densities in the left upper lobe. Patchy parenchymal disease the right lower lobe. Scattered small nodules in the right lower lobe and right middle lobe. Upper Abdomen: Evidence for a balloon retention gastrostomy tube which is partially visualized. Otherwise, images of the upper abdomen are unremarkable. Large amount artifact in the upper abdomen related to patient's arms. Musculoskeletal: No acute bone abnormality. Review of the MIP images confirms the above findings. IMPRESSION: No evidence for a large or central pulmonary embolism. Limited evaluation of the left  lower lobe as described but pulmonary embolism in this area is thought to be unlikely. Endobronchial filling defects within the bronchus intermedius extending into the right middle lobe and right lower lobe. Findings raise concern for aspiration and/or mucous plugging. There is also marked narrowing or blockage in the distal left mainstem bronchus. Patchy parenchymal and nodular densities in both lungs. Findings are suggestive for infection and/or aspiration. Consider 3 month follow-up to ensure that these nodular densities are infectious or inflammatory. Chronic consolidation in the left lower lobe with an enlarging segment of gas or air-filled lung at the left lung base. This may represent trapped air within a small segment of lung. Electronically Signed   By: Markus Daft M.D.   On: 08/04/2017 22:51    EKG: Orders placed or performed during the hospital encounter of 08/05/2017  . ED EKG  . ED EKG  . EKG 12-Lead  . EKG 12-Lead    IMPRESSION AND  PLAN: 1.  Acute hypoxemic respiratory failure, secondary to aspiration pneumonia.  We will start patient on Zosyn and vancomycin.  Also, add duo nebs and oxygen therapy as needed.  We will continue to monitor clinically closely. 2.  Recurrent aspiration pneumonia, see treatment above under #1. 3.  Chronic dysphagia, secondary to Barrett's esophagus.  Continue feeding by G-tube. 4.  Coronary artery disease, stable continue medical treatment . 5.  CHF, currently well compensated, continue medical management. 6.  Parkinson's disease, clinically worsening, due to acute illness.  We will restart home medications. 7.  Functional paraparesis, chronic.  Continue conservative management and support.  All the records are reviewed and case discussed with ED provider. Management plans discussed with the patient, family and they are in agreement.  CODE STATUS:    Code Status Orders  (From admission, onward)        Start     Ordered   07/29/17 0052  Full code  Continuous     07/29/17 0051    Code Status History    Date Active Date Inactive Code Status Order ID Comments User Context   10/22/2016 17:39 10/27/2016 19:18 Full Code [id number]  Theodoro Grist, MD ED   01/04/2016 23:22 01/11/2016 18:13 Full Code [id number]  Lance Coon, MD Inpatient   12/11/2015 17:09 12/13/2015 16:53 Full Code [id number]  Fritzi Mandes, MD Inpatient   11/29/2015 21:13 12/01/2015 13:58 Full Code [id number]  Gladstone Lighter, MD Inpatient   03/06/2015 10:40 03/08/2015 14:22 Full Code [id number]  Adin Hector, MD Inpatient   02/01/2015 17:36 02/08/2015 18:46 Full Code [id number]  Samella Parr, NP Inpatient   01/26/2015 22:05 02/01/2015 17:36 Full Code [id number]  Demetrios Loll, MD Inpatient       TOTAL TIME TAKING CARE OF THIS PATIENT: 45 minutes.    Amelia Jo M.D on 07/29/2017 at 2:58 AM  Between 7am to 6pm - Pager - [phone]  After 6pm go to [email or web address] - password [email or web address] [facility]  [facility]  Office   [phone]  CC: Primary care physician; Adin Hector, MD

## 2017-07-29 NOTE — Progress Notes (Signed)
After further discussion with wife and brother in law and I have explained that prognosis is very poor in the setting of debilitated state with severe resp failure.  They have agreed and consented to DNR status.  Will plan to continue further care and assess resp status daily.    Corrin Parker, M.D.  [facility]  Medical Director [place] Director [facility] Cardio-Pulmonary Department

## 2017-07-29 NOTE — Consult Note (Signed)
Consultation Note Date: 07/29/2017   Patient Name: John Mendoza  DOB: 1945-08-13  MRN: [id number]  Age / Sex: 72 y.o., male  PCP: Adin Hector, MD Referring Physician: Fritzi Mandes, MD  Reason for Consultation: Establishing goals of care  HPI/Patient Profile:   Clinical Assessment and Goals of Care: Mr. John Mendoza is resting in bed on ventilator, his eyes are open and looking around. His wife is at bedside. She states a year ago he was on hospice care and graduated out of it. She states he has been declining.   We discussed planning and goals of care. She states he is worse this time than he has been in other hospitalizations. She states that they will ride things out as he is currently intubated and will see how things go.      SUMMARY OF RECOMMENDATIONS    Continue current care. Will attempt to discuss time frames for withdrawl of care tomorrow.   Code Status/Advance Care Planning:  DNR following this intubation.   Palliative Prophylaxis:   Oral Care   Prognosis:   Poor      Primary Diagnoses: Present on Admission: . Aspiration pneumonia (John Mendoza)   I have reviewed the medical record, interviewed the patient and family, and examined the patient. The following aspects are pertinent.  Past Medical History:  Diagnosis Date  . Anxiety   . Arthritis   . Barrett esophagus   . Basal cell carcinoma   . CAD (coronary artery disease)    on 2l home oxygen  . CHF (congestive heart failure) (John Mendoza)   . Chronic lower back pain   . COPD (chronic obstructive pulmonary disease) (John Mendoza)   . Depression   . DJD (degenerative joint disease)   . DVT (deep venous thrombosis) (John Mendoza) 11/2013; 02/01/2015   on coumadin  . GERD (gastroesophageal reflux disease)   . 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 Heart attack (John Mendoza) 03/20/1995  . History of gout   .  Hyperlipidemia   . Hypertension   . Hypothyroidism   . Iron deficiency anemia   . Lumbar spinal stenosis   . Multiple pulmonary nodules   . On home oxygen therapy   . Parkinson's disease (John Mendoza)   . Recurrent aspiration pneumonia (John Mendoza)   . Seizures ([place])   . Sleep apnea   . Throat cancer (John Mendoza)   . Type II diabetes mellitus (John Mendoza)    Social History   Socioeconomic History  . Marital status: Married    Spouse name: None  . Number of children: 2  . Years of education: None  . Highest education level: None  Social Needs  . Financial resource strain: None  . Food insecurity - worry: None  . Food insecurity - inability: None  . Transportation needs - medical: None  . Transportation needs - non-medical: None  Occupational History  . Occupation: Retired    Comment: has worked in [employer] in the past, exposed to lint.  Tobacco Use  . Smoking status: Former Smoker    Packs/day: 2.00    Years: 35.00    Pack years: 70.00    Types: Cigarettes    Last attempt to quit: 03/20/1995    Years since quitting: 22.3  . Smokeless tobacco: Never Used  Substance and Sexual Activity  . Alcohol use: No    Comment: 02/01/2015 "stopped drinking in ~ 1996; never had problem w/it"  . Drug use: No  . Sexual activity: No  Other Topics Concern  . None  Social History Narrative   Lives at home, has a cane and a walker   Family History  Problem Relation Age of Onset  . Leukemia Paternal Grandmother   . Cancer Maternal Grandmother        stomach  . Cancer Paternal Aunt   . Other Father        bacterial endocarditis  . Rheum arthritis Father    Scheduled Meds: . amiodarone  400 mg Per Tube BID  . ARIPiprazole  5 mg Per Tube BID  . aspirin  81 mg Per Tube QHS  . atorvastatin  40 mg Per Tube QHS  . bisoprolol  10 mg Per Tube Daily  . carbidopa-levodopa  2 tablet Per Tube TID  . clonazePAM  1.5 mg Per Tube QHS  . feeding supplement (PRO-STAT SUGAR FREE 64)  30 mL Per Tube Daily  .  guaiFENesin-dextromethorphan  5 mL Per Tube Q8H  . HYDROcodone-acetaminophen  1 tablet Per Tube QHS  . lamoTRIgine  25 mg Per Tube BID  . levothyroxine  137 mcg Per Tube Daily  . losartan  25 mg Oral QHS  . metFORMIN  500 mg Per Tube BID  . [START ON 07/30/2017] pantoprazole (PROTONIX) IV  40 mg Intravenous Q24H  . PARoxetine  60 mg Oral Daily  . tamsulosin  0.4 mg Oral Daily  . warfarin  3 mg Per Tube ONCE-1800  . Warfarin - Pharmacist Dosing Inpatient   Does not apply q1800   Continuous Infusions: . amiodarone Stopped (07/29/17 1022)  . amiodarone Stopped (07/29/17 1022)  . ampicillin-sulbactam (UNASYN) IV Stopped (07/29/17 1259)  . feeding supplement (VITAL 1.5 CAL)    . fentaNYL infusion INTRAVENOUS 50 mcg/hr (07/29/17 0920)  . phenylephrine (NEO-SYNEPHRINE) Adult infusion 25 mcg/min (07/29/17 1231)   PRN Meds:.acetaminophen, bisacodyl, nitroGLYCERIN, ondansetron (ZOFRAN) IV, polyethylene glycol Medications Prior to Admission:  Prior to Admission medications   Medication Sig Start Date End Date Taking? Authorizing Provider  amiodarone (PACERONE) 200 MG tablet Place 2 tablets (400 mg total) into feeding tube 2 (two) times daily. 10/27/16  Yes Gouru, Illene Silver, MD  acetaminophen (TYLENOL) 500 MG tablet Place 1 tablet (500 mg total) into feeding tube every 6 (six) hours as needed. 10/27/16   Gouru, Illene Silver, MD  ARIPiprazole (ABILIFY) 5 MG tablet Place 1 tablet (5 mg total) into feeding tube 2 (two) times daily. 10/27/16   Nicholes Mango, MD  aspirin 81 MG chewable tablet Place 1 tablet (81 mg total) into feeding tube at bedtime. 10/27/16   Nicholes Mango, MD  atorvastatin (LIPITOR) 40 MG tablet Place 1 tablet (40 mg total) into feeding tube at bedtime. Patient not taking: Reported on 07/29/2017 10/27/16   Nicholes Mango, MD  bisacodyl (DULCOLAX) 10 MG suppository Place 1 suppository (10 mg total) rectally daily as needed for moderate constipation. Patient not taking: Reported on 10/22/2016 02/01/15    Tama High III, MD  bisoprolol (ZEBETA) 10 MG tablet Place 1 tablet (10  mg total) into feeding tube daily. 10/28/16   Gouru, Illene Silver, MD  carbidopa-levodopa (SINEMET IR) 25-250 MG tablet Place 2 tablets into feeding tube 3 (three) times daily. 10/27/16   Gouru, Illene Silver, MD  clonazePAM (KLONOPIN) 1 MG tablet Place 1.5 tablets (1.5 mg total) into feeding tube at bedtime. Take 1 mg in the morning and 1.5 mg at night. 10/27/16   Gouru, Aruna, MD  doxycycline (VIBRA-TABS) 100 MG tablet Place 1 tablet (100 mg total) into feeding tube every 12 (twelve) hours. 10/27/16   Gouru, Illene Silver, MD  feeding supplement, ENSURE ENLIVE, (ENSURE ENLIVE) LIQD Place 237 mLs into feeding tube 5 (five) times daily. 10/27/16   Gouru, Illene Silver, MD  guaiFENesin-dextromethorphan (ROBITUSSIN DM) 100-10 MG/5ML syrup Place 5 mLs into feeding tube every 8 (eight) hours. 10/27/16   Nicholes Mango, MD  HYDROcodone-acetaminophen (NORCO/VICODIN) 5-325 MG tablet Place 1 tablet into feeding tube at bedtime. 10/27/16   Nicholes Mango, MD  lamoTRIgine (LAMICTAL) 25 MG tablet Place 1 tablet (25 mg total) into feeding tube 2 (two) times daily. 10/27/16   Gouru, Illene Silver, MD  levothyroxine (SYNTHROID, LEVOTHROID) 137 MCG tablet Place 1 tablet (137 mcg total) into feeding tube daily. 10/28/16   Nicholes Mango, MD  losartan (COZAAR) 25 MG tablet Take 25 mg by mouth at bedtime. 01/04/16   [provider]  metFORMIN (GLUCOPHAGE) 500 MG tablet Place 1 tablet (500 mg total) into feeding tube 2 (two) times daily. Patient not taking: Reported on 07/29/2017 10/27/16   Nicholes Mango, MD  nitroGLYCERIN (NITROSTAT) 0.4 MG SL tablet Place 0.4 mg under the tongue every 5 (five) minutes x 3 doses as needed for chest pain.     [provider]  omeprazole (PRILOSEC) 20 MG capsule Take 20 mg by mouth 2 (two) times daily before a meal.     [provider]  PARoxetine (PAXIL) 20 MG tablet Take 60 mg by mouth daily.  07/30/10   [provider]  polyethylene  glycol (MIRALAX / GLYCOLAX) packet Place 17 g into feeding tube daily as needed for mild constipation. 10/27/16   Nicholes Mango, MD  Respiratory Therapy Supplies (FLUTTER) DEVI USE AS DIRECTED 02/21/16   Laverle Hobby, MD  tamsulosin (FLOMAX) 0.4 MG CAPS Take 0.4 mg by mouth daily.     [provider]  warfarin (COUMADIN) 6 MG tablet Place 1 tablet (6 mg total) into feeding tube daily at 6 PM. 10/27/16   Nicholes Mango, MD   Allergies  Allergen Reactions  . Other Other (See Comments)    Dizziness, lightheadedness, Pt states that inhaled medications make him depressed and have negative thoughts.    . Sulfa Antibiotics Rash    Headache   Review of Systems  Unable to perform ROS   Physical Exam  Constitutional: No distress.  Pulmonary/Chest:  Ventilator  Neurological: He is alert.    Vital Signs: BP 90/63 (BP Location: Left Arm)   Pulse 96   Temp 98.4 F (36.9 C) (Rectal)   Resp 14   Ht 5\' 9"  (1.753 m)   Wt 57.8 kg (127 lb 6.4 oz)   SpO2 100%   BMI 18.81 kg/m  Pain Assessment: 0-10   Pain Score: Asleep   SpO2: SpO2: 100 % O2 Device:SpO2: 100 % O2 Flow Rate: .O2 Flow Rate (L/min): 15 L/min  IO: Intake/output summary:   Intake/Output Summary (Last 24 hours) at 07/29/2017 1403 Last data filed at 07/29/2017 1229 Gross per 24 hour  Intake 511 ml  Output 150 ml  Net 361 ml    LBM:   Baseline Weight: Weight: 62.1 kg (137 lb) Most recent weight: Weight: 57.8 kg (127 lb 6.4 oz)     Palliative Assessment/Data: 10%     Time In: 1:30 Time Out: 2:20 Time Total: 50 min Greater than 50%  of this time was spent counseling and coordinating care related to the above assessment and plan.  Signed by: Asencion Gowda, NP   Please contact Palliative Medicine Team phone at [phone] for questions and concerns.  For individual provider: See Shea Evans

## 2017-07-29 NOTE — Progress Notes (Addendum)
Initial Nutrition Assessment  DOCUMENTATION CODES:   Severe malnutrition in context of chronic illness  INTERVENTION:  Initiate Vital 1.5 at 20 mL/hr via G-tube and after 8 hours advance to goal rate of Vital 1.5 at 45 mL/hr (1080 mL goal daily volume) + Pro-Stat 30 mL daily. Provides 1720 kcal, 88 grams of protein, 821 mL H2O daily.  Provide liquid MVI daily per tube.  Monitor magnesium, potassium, and phosphorus every 12 hours for 4 occurences, MD to replete as needed, as pt is at risk for refeeding syndrome given severe malnutrition.  NUTRITION DIAGNOSIS:   Severe Malnutrition related to chronic illness(COPD, hx throat cancer, Barrett esophagus, dysphagia s/p PEG tube placement) as evidenced by severe fat depletion, severe muscle depletion.  GOAL:   Provide needs based on ASPEN/SCCM guidelines  MONITOR:   Vent status, Labs, Weight trends, TF tolerance, Skin, I & O's  REASON FOR ASSESSMENT:   Ventilator    ASSESSMENT:   72 year old male with PMHx of HTN, HLD, CHF, COPD, CAD s/p CABG x 3 in 1996, hx DVT, Parkinson's disease, hypothyroidism, DM type 2, iron deficiency anemia, GERD, depression, anxiety, hx throat cancer s/p right radical neck dissection around 1998, hx thyroidectomy 1996, Barrett esophagus, functional paraparesis, dysphagia s/p PEG tube placement 01/10/2016 now admitted with acute hypoxemic respiratory failure secondary to aspiration PNA requiring emergent intubation 1/23.   -Pending PMT consult to discuss goals of care.  Patient's brother-in-law present at time of RD assessment. He reports his G-tube was placed 2-3 years ago. He is supposed to be NPO, but he "cheats" and takes some PO. Brother-in-law does not know how often or what type of things he takes by mouth. Current G-tube is a new one just recently replaced. Patient lives at home with his wife. Wife has not been in yet. Per review of chart patient's usual home tube feeding regimen is Ensure Plus 5 times  daily per G-tube. Unsure of usual free water flushes.  Most recent weights in chart do not appear accurate. Patient was 183.6 lbs on 02/08/2015.  Access: 20 Fr. [place] G-tube with 20 cc balloon present on admission; per chart recently replaced on 07/14/2017 by Dr. [name]; patient refused gastrografin tube study to check placement of tube in the office  MAP: 56-91 mmHg  Patient is currently intubated on ventilator support MV: 7.8 L/min Temp (24hrs), Avg:99.2 F (37.3 C), Min:97.8 F (36.6 C), Max:100.7 F (38.2 C)  Propofol: N/A  Medications reviewed and include: amiodarone, levothyroxine, metformin 500 mg BID, pantoprazole, warfarin, Unasyn, fentanyl gtt (ordered but not used), phenylephrine gtt currently at 11 mcg/min.  Labs reviewed: CBG 70-143, Chloride 97, CO2 34, BUN 24.  Discussed with RN. Plan is to start tube feeds today.  NUTRITION - FOCUSED PHYSICAL EXAM:    Most Recent Value  Orbital Region  Severe depletion  Upper Arm Region  Severe depletion  Thoracic and Lumbar Region  Severe depletion  Buccal Region  Unable to assess  Temple Region  Severe depletion  Clavicle Bone Region  Severe depletion  Clavicle and Acromion Bone Region  Severe depletion  Scapular Bone Region  Unable to assess  Dorsal Hand  Severe depletion  Patellar Region  Severe depletion  Anterior Thigh Region  Severe depletion  Posterior Calf Region  Moderate depletion  Edema (RD Assessment)  None  Hair  Reviewed  Eyes  Unable to assess  Mouth  Unable to assess  Skin  Reviewed  Nails  Reviewed     Diet Order:  No diet orders on file  EDUCATION NEEDS:   No education needs have been identified at this time  Skin:  Skin Assessment: Reviewed RN Assessment(ecchymosis to arm)  Last BM:  Unknown  Height:   Ht Readings from Last 1 Encounters:  07/29/17 5\' 9"  (1.753 m)    Weight:   Wt Readings from Last 1 Encounters:  07/29/17 127 lb 6.4 oz (57.8 kg)    Ideal Body Weight:  72.7  kg  BMI:  Body mass index is 18.81 kg/m.  Estimated Nutritional Needs:   Kcal:  1680 (PSU 2003b w/ MSJ 1324, Ve 7.8, Tmax 38.2)  Protein:  87-100 grams (1.5-1.7 grams/kg)  Fluid:  1.4-1.7 L/day (25-30 mL/kg)  Willey Blade, MS, RD, LDN Office: [phone] Pager: [phone] After Hours/Weekend Pager: [phone]

## 2017-07-29 NOTE — Progress Notes (Signed)
Pharmacy Antibiotic Note  John Mendoza is a 72 y.o. male admitted on 07/20/2017 with aspiration pneumonia.  Pharmacy has been consulted for vancomycin dosing.  Plan: DW 62kg  Vd 43L kei 0.065 hr-1  T1/2 11 hours Vancomycin 750 mg q 12 hours ordered with stacked dosing. Level before 5th dose. Goal trough 15-20.  Height: 5\' 9"  (175.3 cm) Weight: 137 lb (62.1 kg) IBW/kg (Calculated) : 70.7  Temp (24hrs), Avg:100 F (37.8 C), Min:99.2 F (37.3 C), Max:100.7 F (38.2 C)  Recent Labs  Lab 07/20/2017 1810 07/27/2017 2325  WBC 6.0  --   CREATININE 0.73  --   LATICACIDVEN  --  0.6    Estimated Creatinine Clearance: 73.3 mL/min (by C-G formula based on SCr of 0.73 mg/dL).    Allergies  Allergen Reactions  . Other Other (See Comments)    Dizziness, lightheadedness, Pt states that inhaled medications make him depressed and have negative thoughts.    . Sulfa Antibiotics Rash    Headache    Antimicrobials this admission: Vancomycin, Unasyn 1/23  >>    >>   Dose adjustments this admission:   Microbiology results: 1/22 BCx: pending 1/23 Sputum: pending     Thank you for allowing pharmacy to be a part of this patient's care.  Jesson Foskey S 07/29/2017 12:45 AM

## 2017-07-29 NOTE — ED Notes (Signed)
Patient transported to [id number] 

## 2017-07-29 NOTE — Progress Notes (Signed)
RN went into to pt's room to wake him up to assess him, pt woke up and was not able to speak and very diaphoretic, sounded like he had fluid stuck in his airway. Pt was able to follow commands and was not able to cough anything up. Lungs sounded very junky and wet. Vital signs were checked, pt.'s 02 was 60% on 4L, BP 151/103, HR 191. CBG checked and was 70. Rapid response called, RT performed two deep suctions and received 50 ml out. Non-rebreather was placed on pt and 02 came up to 85%. New orders to transfer pt to ICU 10. RN gave report at bedside to ICU nurse. RN notified pt.'s wife about pt.'s change of status. All pt.'s belongings were packed up and transferred with pt to ICU 10.   Hortence Charter [employer]

## 2017-07-29 NOTE — ED Notes (Signed)
Report off to [facility] rn floor nurse

## 2017-07-29 NOTE — Consult Note (Signed)
Chaplain responded to rapid response; no family present; Ch available as required or requested.

## 2017-07-29 NOTE — Consult Note (Signed)
PULMONARY / CRITICAL CARE MEDICINE   Name: John Mendoza MRN: [id number] DOB: 1945-12-16    ADMISSION DATE:  07/29/2017 CONSULTATION DATE:  07/29/2017  REFERRING MD:  Dr. Duane Boston  CHIEF COMPLAINT:  Respiratory distress  HISTORY OF PRESENT ILLNESS:   72 year-old male with extensive PMH as below presented to the ED last night with cough, dyspnea, and chest pain x2 days. CXR suspicious for RLL infiltrate, CT-A negative for PE. Pt has a g-tube for nutrition. Pt was admitted for treatment of aspiration PNA.   This morning, the pt acutely decompensated with increasing WOB and tachycardia to the 180s. A rapid response was called and the pt was ultimately brought to the ICU. He was intubated on arrival and became hypotensive requiring pressors.   Of note, the pt is highly dependent at baseline and has a number of medical problems any of which confer a very poor prognosis and quality of life. Aspiration PNA has been a recurrent issue for him and will likely continue to be.    PAST MEDICAL HISTORY :  He  has a past medical history of Anxiety, Arthritis, Barrett esophagus, Basal cell carcinoma, CAD (coronary artery disease), CHF (congestive heart failure) ([place]), Chronic lower back pain, COPD (chronic obstructive pulmonary disease) ([place]), Depression, DJD (degenerative joint disease), DVT (deep venous thrombosis) ([place]) (11/2013; 02/01/2015), GERD (gastroesophageal reflux disease), Goodpasture syndrome ([place]), Heart attack ([place]) (03/20/1995), History of gout, Hyperlipidemia, Hypertension, Hypothyroidism, Iron deficiency anemia, Lumbar spinal stenosis, Multiple pulmonary nodules, On home oxygen therapy, Parkinson's disease ([place]), Recurrent aspiration pneumonia ([place]), Seizures ([place]), Sleep apnea, Throat cancer ([place]), and Type II diabetes mellitus ([place]).  PAST SURGICAL HISTORY: He  has a past surgical history that includes Anterior cervical decomp/discectomy fusion (X 2); Radical neck dissection (Right, ~ 1998);  Total knee arthroplasty (Right, 2011); Thyroidectomy (~ 1996 X 2); Cataract extraction w/ intraocular lens  implant, bilateral (Bilateral); Tonsillectomy; Excisional hemorrhoidectomy; Joint replacement; Back surgery; Posterior fusion cervical spine (X 1); Coronary angioplasty with stent; Coronary artery bypass graft (1996); Excision basal cell carcinoma; and PEG placement (N/A, 01/10/2016).  Allergies  Allergen Reactions  . Other Other (See Comments)    Dizziness, lightheadedness, Pt states that inhaled medications make him depressed and have negative thoughts.    . Sulfa Antibiotics Rash    Headache    No current facility-administered medications on file prior to encounter.    Current Outpatient Medications on File Prior to Encounter  Medication Sig  . amiodarone (PACERONE) 200 MG tablet Place 2 tablets (400 mg total) into feeding tube 2 (two) times daily.  Marland Kitchen acetaminophen (TYLENOL) 500 MG tablet Place 1 tablet (500 mg total) into feeding tube every 6 (six) hours as needed.  . ARIPiprazole (ABILIFY) 5 MG tablet Place 1 tablet (5 mg total) into feeding tube 2 (two) times daily.  Marland Kitchen aspirin 81 MG chewable tablet Place 1 tablet (81 mg total) into feeding tube at bedtime.  Marland Kitchen atorvastatin (LIPITOR) 40 MG tablet Place 1 tablet (40 mg total) into feeding tube at bedtime. (Patient not taking: Reported on 07/29/2017)  . bisacodyl (DULCOLAX) 10 MG suppository Place 1 suppository (10 mg total) rectally daily as needed for moderate constipation. (Patient not taking: Reported on 10/22/2016)  . bisoprolol (ZEBETA) 10 MG tablet Place 1 tablet (10 mg total) into feeding tube daily.  . carbidopa-levodopa (SINEMET IR) 25-250 MG tablet Place 2 tablets into feeding tube 3 (three) times daily.  . clonazePAM (KLONOPIN) 1 MG tablet Place 1.5 tablets (1.5 mg total) into feeding  tube at bedtime. Take 1 mg in the morning and 1.5 mg at night.  . doxycycline (VIBRA-TABS) 100 MG tablet Place 1 tablet (100 mg total) into  feeding tube every 12 (twelve) hours.  . feeding supplement, ENSURE ENLIVE, (ENSURE ENLIVE) LIQD Place 237 mLs into feeding tube 5 (five) times daily.  Marland Kitchen guaiFENesin-dextromethorphan (ROBITUSSIN DM) 100-10 MG/5ML syrup Place 5 mLs into feeding tube every 8 (eight) hours.  Marland Kitchen HYDROcodone-acetaminophen (NORCO/VICODIN) 5-325 MG tablet Place 1 tablet into feeding tube at bedtime.  . lamoTRIgine (LAMICTAL) 25 MG tablet Place 1 tablet (25 mg total) into feeding tube 2 (two) times daily.  Marland Kitchen levothyroxine (SYNTHROID, LEVOTHROID) 137 MCG tablet Place 1 tablet (137 mcg total) into feeding tube daily.  Marland Kitchen losartan (COZAAR) 25 MG tablet Take 25 mg by mouth at bedtime.  . metFORMIN (GLUCOPHAGE) 500 MG tablet Place 1 tablet (500 mg total) into feeding tube 2 (two) times daily. (Patient not taking: Reported on 07/29/2017)  . nitroGLYCERIN (NITROSTAT) 0.4 MG SL tablet Place 0.4 mg under the tongue every 5 (five) minutes x 3 doses as needed for chest pain.   Marland Kitchen omeprazole (PRILOSEC) 20 MG capsule Take 20 mg by mouth 2 (two) times daily before a meal.   . PARoxetine (PAXIL) 20 MG tablet Take 60 mg by mouth daily.   . polyethylene glycol (MIRALAX / GLYCOLAX) packet Place 17 g into feeding tube daily as needed for mild constipation.  Marland Kitchen Respiratory Therapy Supplies (FLUTTER) DEVI USE AS DIRECTED  . tamsulosin (FLOMAX) 0.4 MG CAPS Take 0.4 mg by mouth daily.   Marland Kitchen warfarin (COUMADIN) 6 MG tablet Place 1 tablet (6 mg total) into feeding tube daily at 6 PM.    FAMILY HISTORY:  His indicated that the status of his father is unknown. He indicated that the status of his maternal grandmother is unknown. He indicated that the status of his paternal grandmother is unknown. He indicated that the status of his paternal aunt is unknown.   SOCIAL HISTORY: He  reports that he quit smoking about 22 years ago. His smoking use included cigarettes. He has a 70.00 pack-year smoking history. he has never used smokeless tobacco. He reports  that he does not drink alcohol or use drugs.  REVIEW OF SYSTEMS:   Not performed 2/2 pt condition requiring critical intervention  SUBJECTIVE:  Critically ill appearing cachetic male lying in bed with O2 via NRB and significant respiratory distress.   VITAL SIGNS: BP (!) 155/117 (BP Location: Left Arm)   Pulse (!) 191   Temp 97.8 F (36.6 C) (Oral)   Resp 18   Ht 5\' 9"  (1.753 m)   Wt 57.8 kg (127 lb 6.4 oz)   SpO2 (!) 85%   BMI 18.81 kg/m    VENTILATOR SETTINGS: Vent Mode: PRVC FiO2 (%):  [100 %] 100 % Set Rate:  [16 bmp] 16 bmp Vt Set:  [500 mL] 500 mL PEEP:  [5 cmH20] 5 cmH20  INTAKE / OUTPUT: I/O last 3 completed shifts: In: 411 [I.V.:261; IV Piggyback:150] Out: 150 [Urine:150]  PHYSICAL EXAMINATION: General:  Critically ill-appearing Neuro:  Intubated, sedated GCS<8T HEENT:  NCAT Cardiovascular:  S1 S2, IRR, no m/g/r. No LE edema. Lungs:  Poor air movement Abdomen:  Soft non-distended Musculoskeletal:  No gross deformity Skin:  Cool, pale, ashen  LABS:  BMET Recent Labs  Lab 07/22/2017 1810  NA 138  K 4.6  CL 97*  CO2 34*  BUN 24*  CREATININE 0.73  GLUCOSE 112*  Electrolytes Recent Labs  Lab 07/27/2017 1810  CALCIUM 9.3    CBC Recent Labs  Lab 07/08/2017 1810  WBC 6.0  HGB 8.2*  HCT 25.2*  PLT 100*    Coag's Recent Labs  Lab 07/29/17 0509  INR 2.83    Sepsis Markers Recent Labs  Lab 08/04/2017 2325 07/29/17 0243  LATICACIDVEN 0.6 0.9    ABG No results for input(s): PHART, PCO2ART, PO2ART in the last 168 hours.  Liver Enzymes No results for input(s): AST, ALT, ALKPHOS, BILITOT, ALBUMIN in the last 168 hours.  Cardiac Enzymes Recent Labs  Lab 07/20/2017 1810  TROPONINI 0.03*    Glucose Recent Labs  Lab 07/29/17 0923  GLUCAP 70    Imaging Dg Chest 2 View  Result Date: 07/16/2017 CLINICAL DATA:  Cough and dyspnea with intermittent chest pain. EXAM: CHEST  2 VIEW COMPARISON:  10/26/2016 FINDINGS: Stable  cardiomegaly with aortic atherosclerosis and post CABG change. Small left pleural effusion blunting the posterior costophrenic angle with associated compressive and subsegmental atelectasis. No overt pulmonary edema. Minimal scarring in the left upper lobe. No acute nor suspicious osseous abnormality. Thoracic spondylosis. IMPRESSION: 1. Stable cardiomegaly with aortic atherosclerosis and post CABG change. 2. Small left pleural effusion with atelectasis. Electronically Signed   By: Ashley Royalty M.D.   On: 07/11/2017 18:58   Ct Angio Chest Pe W And/or Wo Contrast  Result Date: 07/09/2017 CLINICAL DATA:  72 year old with cough and shortness of breath. EXAM: CT ANGIOGRAPHY CHEST WITH CONTRAST TECHNIQUE: Multidetector CT imaging of the chest was performed using the standard protocol during bolus administration of intravenous contrast. Multiplanar CT image reconstructions and MIPs were obtained to evaluate the vascular anatomy. CONTRAST:  34mL ISOVUE-370 IOPAMIDOL (ISOVUE-370) INJECTION 76% COMPARISON:  Chest CT 01/12/2016.  Chest radiograph 07/28/2016 FINDINGS: Cardiovascular: No evidence for a large or central pulmonary embolism. Poor opacification of the segmental branches in the left lower lobe but pulmonary embolism in this area is thought to be unlikely. Atherosclerotic calcifications in the thoracic aorta. Coronary arteries are heavily calcified. Post CABG procedure. Heart is enlarged without significant pericardial fluid. Mediastinum/Nodes: Few prominent lymph nodes in the mediastinum appear unchanged. No significant axillary lymphadenopathy. No significant hilar lymphadenopathy. Lungs/Pleura: Small left pleural effusion. Centrilobular emphysema. Trachea is patent but there is occlusion or narrowing of the distal left mainstem bronchus. Right upper lobe bronchus is patent but there are endobronchial filling defects within the bronchus intermedius extending into the right lobe and right middle lobe. There is  chronic consolidation in the left lower lobe with a pocket of gas or air-filled lung in the posterior left lung base. This pocket of gas has slightly enlarged, measuring up to 4.8 cm and previously measuring 2.9 cm. These findings have not significantly changed since 2017. Patchy parenchymal densities in the left upper lobe. Patchy parenchymal disease the right lower lobe. Scattered small nodules in the right lower lobe and right middle lobe. Upper Abdomen: Evidence for a balloon retention gastrostomy tube which is partially visualized. Otherwise, images of the upper abdomen are unremarkable. Large amount artifact in the upper abdomen related to patient's arms. Musculoskeletal: No acute bone abnormality. Review of the MIP images confirms the above findings. IMPRESSION: No evidence for a large or central pulmonary embolism. Limited evaluation of the left lower lobe as described but pulmonary embolism in this area is thought to be unlikely. Endobronchial filling defects within the bronchus intermedius extending into the right middle lobe and right lower lobe. Findings raise concern for aspiration and/or  mucous plugging. There is also marked narrowing or blockage in the distal left mainstem bronchus. Patchy parenchymal and nodular densities in both lungs. Findings are suggestive for infection and/or aspiration. Consider 3 month follow-up to ensure that these nodular densities are infectious or inflammatory. Chronic consolidation in the left lower lobe with an enlarging segment of gas or air-filled lung at the left lung base. This may represent trapped air within a small segment of lung. Electronically Signed   By: Markus Daft M.D.   On: 07/22/2017 22:51   I have Independently reviewed images of  CXR   on 07/29/2017 Interpretation: R lung opacity    STUDIES:  01/23 CXR ETT 4.5 cm above carina, opacity in RLL increased from previous likely aspiration PNA, small left pleural effusion  CULTURES: BC  pending  ANTIBIOTICS: Vancomycin 1/22>>1/23 Zosyn 1/22>>1/23 Unasyn 1/23 >>  SIGNIFICANT EVENTS: 1/22 Admitted to floor with cough, dyspnea and CXR findings c/w aspiration PNA 1/23 While on floor, pt acutely decompensated with respiratory distress and tachycardia to the 180s. Rapid response called, pt transferred to ICU 1/23 On arrival in the ICU, pt in respiratory failure and shock. Pt intubated, subsequently developed hypotension and started on neo-synephrine  LINES/TUBES: 8.0 ETT 1/23 >>  DISCUSSION: This is a 72 year-old male pt with many chronically debilitating health problems who is dependent at baseline and likely has a poor quality of life. He is acutely ill with aspiration PNA and respiratory failure. Given his prognosis, he would likely most benefit from palliative care. Family is en-route, we will discuss goals of care to guide further therapy.      ASSESSMENT / PLAN: 72 year-old male with acute and severe hypoxic respiratory failure 2/2 recurrent aspiration PNA as well as a number of chronic medical conditions.    PULMONARY A: Acute hypoxic/hypercarbic respiratory failure 2/2 aspiration PNA Acute respiratory acidosis 2/2 hypoventilation P:   Intubated Continue mechanical ventilation Unasyn for PNA  CARDIOVASCULAR A:  Pleural effusion with history of CAD and CHF Elevated troponin (0.03) P:  Check BNP Follow troponin  RENAL A: No issues   P:   Follow BMP Electrolyte replacement as needed  GASTROINTESTINAL A:   History of dysphagia, dependent on g-tube for nutrition P:   Feeds per dietary  HEMATOLOGIC A:   Mild anemia in the setting of chronic disease P:  Follow CBC DVT prophylaxis   INFECTIOUS A:   Aspiration PNA, lactic acid not elevated P:   Continue Unasyn Follow cultures Trend PCT  ENDOCRINE A:   No issues   P:   CBG monitoring  NEUROLOGIC A:   Sedated on mechanical ventilation P:   Continue sedation  RASS goal: -2 -  -3    Critical Care Time devoted to patient care services described in this note is 55 minutes.   Overall, patient is critically ill, prognosis is guarded.  Patient with Multiorgan failure and at high risk for cardiac arrest and death.    Corrin Parker, M.D.  [facility]  Medical Director [place] Director [facility] Cardio-Pulmonary Department

## 2017-07-29 NOTE — Consult Note (Signed)
ANTICOAGULATION CONSULT NOTE - Initial Consult  Pharmacy Consult for Warfarin Dosing  Indication: DVT  Allergies  Allergen Reactions  . Other Other (See Comments)    Dizziness, lightheadedness, Pt states that inhaled medications make him depressed and have negative thoughts.    . Sulfa Antibiotics Rash    Headache   Patient Measurements: Height: 5\' 9"  (175.3 cm) Weight: 127 lb 6.4 oz (57.8 kg) IBW/kg (Calculated) : 70.7 Vital Signs: Temp: 99.7 F (37.6 C) (01/23 1000) Temp Source: Axillary (01/23 1000) BP: 128/76 (01/23 1051) Pulse Rate: 97 (01/23 1051)  Labs: Recent Labs    07/18/2017 1810 07/29/17 0509  HGB 8.2*  --   HCT 25.2*  --   PLT 100*  --   LABPROT  --  29.5*  INR  --  2.83  CREATININE 0.73  --   TROPONINI 0.03*  --     Estimated Creatinine Clearance: 68.2 mL/min (by C-G formula based on SCr of 0.73 mg/dL).   Medical History: Past Medical History:  Diagnosis Date  . Anxiety   . Arthritis   . Barrett esophagus   . Basal cell carcinoma   . CAD (coronary artery disease)    on 2l home oxygen  . CHF (congestive heart failure) ([place])   . Chronic lower back pain   . COPD (chronic obstructive pulmonary disease) ([place])   . Depression   . DJD (degenerative joint disease)   . DVT (deep venous thrombosis) ([place]) 11/2013; 02/01/2015   on coumadin  . GERD (gastroesophageal reflux disease)   . 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 Heart attack ([place]) 03/20/1995  . History of gout   . Hyperlipidemia   . Hypertension   . Hypothyroidism   . Iron deficiency anemia   . Lumbar spinal stenosis   . Multiple pulmonary nodules   . On home oxygen therapy   . Parkinson's disease ([place])   . Recurrent aspiration pneumonia ([place])   . Seizures ([place])   . Sleep apnea   . Throat cancer ([place])   . Type II diabetes mellitus ([place])     Assessment: Pharmacy consulted for warfarin dosing and monitoring in 72 yo male admitted with  aspiration PNA. INR on admission therapeutic at 2.83. Patient was taking Warfarin 6mg  daliy PTA.   DATE INR DOSE 1/23 2.83 3mg   Goal of Therapy:  INR 2-3 Monitor platelets by anticoagulation protocol: Yes   Plan:  Patient started on Unasyn for aspiration PNA. Expect to see INR increase with start of Abx. Will order warfarin 3mg  x 1 dose tonight.  INR daily per protocol while antibiotics.   Pernell Dupre, PharmD, BCPS Clinical Pharmacist 07/29/2017 11:51 AM

## 2017-07-29 NOTE — Consult Note (Signed)
PHARMACY CONSULT NOTE - INITIAL   Pharmacy Consult for Electrolyte Monitoring and Replacement   Allergies  Allergen Reactions  . Other Other (See Comments)    Dizziness, lightheadedness, Pt states that inhaled medications make him depressed and have negative thoughts.    . Sulfa Antibiotics Rash    Headache   Patient Measurements: Height: 5\' 9"  (175.3 cm) Weight: 127 lb 6.4 oz (57.8 kg) IBW/kg (Calculated) : 70.7  Vital Signs: Temp: 98.4 F (36.9 C) (01/23 1200) Temp Source: Axillary (01/23 1200) BP: 103/77 (01/23 1400) Pulse Rate: 95 (01/23 1400) Intake/Output from previous day: 01/22 0701 - 01/23 0700 In: 411 [I.V.:261; IV Piggyback:150] Out: 150 [Urine:150] Intake/Output from this shift: Total I/O In: 100 [IV Piggyback:100] Out: -   Labs: Recent Labs    07/12/2017 1810 07/29/17 1317  WBC 6.0  --   HGB 8.2*  --   HCT 25.2*  --   PLT 100*  --   CREATININE 0.73  --   MG  --  1.8  PHOS  --  3.0   Potassium (mmol/L)  Date Value  07/30/2017 4.6  01/18/2014 4.7   Sodium (mmol/L)  Date Value  08/02/2017 138  01/18/2014 140   Magnesium (mg/dL)  Date Value  07/29/2017 1.8   Calcium (mg/dL)  Date Value  07/11/2017 9.3   Calcium, Total (mg/dL)  Date Value  01/18/2014 8.9   Albumin (g/dL)  Date Value  01/04/2016 3.7  01/17/2014 3.2 (L)   Phosphorus (mg/dL)  Date Value  07/29/2017 3.0  ] Estimated Creatinine Clearance: 68.2 mL/min (by C-G formula based on SCr of 0.73 mg/dL).  Assessment: Pharmacy consulted for electrolyte monitoring and replacement in 72 yo male with acute respiratory failure secondary to recurrent aspiration PNA.  Patient intubated on 1/23.   Goal of Therapy:  Electrolytes WNL Mg: >2 K+: >4  Plan:  1/23 Mg: 1.8. Will replace with 1g IV x 1 dose. Follow up electrolytes with AM labs and continue to replace as needed.   Pernell Dupre, PharmD, BCPS Clinical Pharmacist 07/29/2017 2:59 PM

## 2017-07-29 NOTE — Progress Notes (Addendum)
[facility] NAME: John Mendoza    MR#:  [id number]  DATE OF BIRTH:  1945-10-04  SUBJECTIVE:  Rapid response was called on patient with increasing shortness of breath and sats dropping down on 60%.  Patient appeared cold and clammy. Was on nonrebreather.  Transfer to ICU.  Patient was intubated now placed on the ventilator. REVIEW OF SYSTEMS:   Review of Systems  Unable to perform ROS: Intubated   DRUG ALLERGIES:   Allergies  Allergen Reactions  . Other Other (See Comments)    Dizziness, lightheadedness, Pt states that inhaled medications make him depressed and have negative thoughts.    . Sulfa Antibiotics Rash    Headache    VITALS:  Blood pressure 103/77, pulse 95, temperature 98.4 F (36.9 C), temperature source Axillary, resp. rate 14, height 5\' 9"  (1.753 m), weight 57.8 kg (127 lb 6.4 oz), SpO2 100 %.  PHYSICAL EXAMINATION:   Physical Exam  GENERAL:  72 y.o.-year-old patient lying in the bed with  Acute respiratory distress.  Critically ill EYES: Pupils equal, round, reactive to light and accommodation. No scleral icterus. Extraocular muscles intact.  HEENT: Head atraumatic, normocephalic. Oropharynx and nasopharynx clear.  Intubated on the ventilator NECK:  Supple, no jugular venous distention. No thyroid enlargement, no tenderness.  LUNGS: Normal breath sounds bilaterally, no wheezing, rales, rhonchi. No use of accessory muscles of respiration.  CARDIOVASCULAR: S1, S2 normal. No murmurs, rubs, or gallops.  Tachycardia ABDOMEN: Soft, nontender, nondistended. Bowel sounds present. No organomegaly or mass.  EXTREMITIES: No cyanosis, clubbing or edema b/l.    NEUROLOGIC: on  The vent PSYCHIATRIC: Sedated on the vent.  SKIN: No obvious rash, lesion, or ulcer.   LABORATORY PANEL:  CBC Recent Labs  Lab 07/25/2017 1810  WBC 6.0  HGB 8.2*  HCT 25.2*  PLT 100*    Chemistries  Recent Labs  Lab 07/25/2017 1810  07/29/17 1317  NA 138  --   K 4.6  --   CL 97*  --   CO2 34*  --   GLUCOSE 112*  --   BUN 24*  --   CREATININE 0.73  --   CALCIUM 9.3  --   MG  --  1.8   Cardiac Enzymes Recent Labs  Lab 07/26/2017 1810  TROPONINI 0.03*   RADIOLOGY:  Dg Chest 2 View  Result Date: 07/25/2017 CLINICAL DATA:  Cough and dyspnea with intermittent chest pain. EXAM: CHEST  2 VIEW COMPARISON:  10/26/2016 FINDINGS: Stable cardiomegaly with aortic atherosclerosis and post CABG change. Small left pleural effusion blunting the posterior costophrenic angle with associated compressive and subsegmental atelectasis. No overt pulmonary edema. Minimal scarring in the left upper lobe. No acute nor suspicious osseous abnormality. Thoracic spondylosis. IMPRESSION: 1. Stable cardiomegaly with aortic atherosclerosis and post CABG change. 2. Small left pleural effusion with atelectasis. Electronically Signed   By: Ashley Royalty M.D.   On: 07/08/2017 18:58   Ct Angio Chest Pe W And/or Wo Contrast  Result Date: 07/22/2017 CLINICAL DATA:  72 year old with cough and shortness of breath. EXAM: CT ANGIOGRAPHY CHEST WITH CONTRAST TECHNIQUE: Multidetector CT imaging of the chest was performed using the standard protocol during bolus administration of intravenous contrast. Multiplanar CT image reconstructions and MIPs were obtained to evaluate the vascular anatomy. CONTRAST:  78mL ISOVUE-370 IOPAMIDOL (ISOVUE-370) INJECTION 76% COMPARISON:  Chest CT 01/12/2016.  Chest radiograph 07/28/2016 FINDINGS: Cardiovascular: No evidence for a large or central pulmonary embolism. Poor  opacification of the segmental branches in the left lower lobe but pulmonary embolism in this area is thought to be unlikely. Atherosclerotic calcifications in the thoracic aorta. Coronary arteries are heavily calcified. Post CABG procedure. Heart is enlarged without significant pericardial fluid. Mediastinum/Nodes: Few prominent lymph nodes in the mediastinum appear  unchanged. No significant axillary lymphadenopathy. No significant hilar lymphadenopathy. Lungs/Pleura: Small left pleural effusion. Centrilobular emphysema. Trachea is patent but there is occlusion or narrowing of the distal left mainstem bronchus. Right upper lobe bronchus is patent but there are endobronchial filling defects within the bronchus intermedius extending into the right lobe and right middle lobe. There is chronic consolidation in the left lower lobe with a pocket of gas or air-filled lung in the posterior left lung base. This pocket of gas has slightly enlarged, measuring up to 4.8 cm and previously measuring 2.9 cm. These findings have not significantly changed since 2017. Patchy parenchymal densities in the left upper lobe. Patchy parenchymal disease the right lower lobe. Scattered small nodules in the right lower lobe and right middle lobe. Upper Abdomen: Evidence for a balloon retention gastrostomy tube which is partially visualized. Otherwise, images of the upper abdomen are unremarkable. Large amount artifact in the upper abdomen related to patient's arms. Musculoskeletal: No acute bone abnormality. Review of the MIP images confirms the above findings. IMPRESSION: No evidence for a large or central pulmonary embolism. Limited evaluation of the left lower lobe as described but pulmonary embolism in this area is thought to be unlikely. Endobronchial filling defects within the bronchus intermedius extending into the right middle lobe and right lower lobe. Findings raise concern for aspiration and/or mucous plugging. There is also marked narrowing or blockage in the distal left mainstem bronchus. Patchy parenchymal and nodular densities in both lungs. Findings are suggestive for infection and/or aspiration. Consider 3 month follow-up to ensure that these nodular densities are infectious or inflammatory. Chronic consolidation in the left lower lobe with an enlarging segment of gas or air-filled lung  at the left lung base. This may represent trapped air within a small segment of lung. Electronically Signed   By: Markus Daft M.D.   On: 07/16/2017 22:51   Dg Chest Port 1 View  Result Date: 07/29/2017 CLINICAL DATA:  Intubation EXAM: PORTABLE CHEST 1 VIEW COMPARISON:  CTA chest dated 08/03/2017 FINDINGS: Endotracheal tube terminates 4.5 cm above the carina. Pulmonary vascular congestion with suspected mild interstitial edema. Patchy right lower lobe opacity, progressed from recent CT, suspicious for pneumonia. Left lower lobe opacity, at least some of which likely reflects chronic atelectasis/scarring, with associated small left pleural effusion. The heart is normal in size. Leftward cardiomediastinal shift. Postsurgical changes related to prior CABG. Median sternotomy. IMPRESSION: Endotracheal tube terminates 4.5 cm above the carina. Suspected mild interstitial edema with small left pleural effusion. Patchy right lower lobe opacity, progressed from recent CT, suspicious for pneumonia. Left lower lobe opacity, at least some of which may reflect chronic atelectasis/scarring. Electronically Signed   By: Julian Hy M.D.   On: 07/29/2017 10:29   ASSESSMENT AND PLAN:   John Mendoza  is a 72 y.o. male with extensive medical history, including COPD on 2 L continuous home oxygen, coronary artery disease, CHF, Barrett's esophagus, on G-tube feeds, Parkinson's disease, Goodpasture's syndrome, throat cancer and recurrent aspiration pneumonias.   Patient was brought to emergency room via EMS for acute onset of shortness of breath and productive cough going on for the past 2 days.  He also requires more oxygen,  3 L per nasal cannula to maintain oxygen saturation at 93%  1.  Acute hypoxemic respiratory failure, secondary to recurrent aspiration pneumonia.   -Patient was transferred to ICU for worsening symptoms.   -Likely intubated and on the ventilator  - patient was on Zosyn and vancomycin--IV unasyn - add  duo nebs and oxygen therapy as needed.   -CT chest shows worsening pneumonia with possible mucous plugging  2.  Recurrent aspiration pneumonia, see treatment above under #1.  3.  Chronic dysphagia, secondary to Barrett's esophagus.  Continue feeding by G-tube.  4.  Coronary artery disease, stable continue medical treatment .  5.  CHF, currently well compensated, continue medical management.  6.  Parkinson's disease, clinically worsening, due to acute illness.  We will restart home medications thru g-tube  7.  Functional paraparesis, chronic.  Continue conservative management and support Patient is bedbound.  He is being taken care of by wife at home  Wife understands patient has a poor prognosis  Case discussed with Care Management/Social Worker. Management plans discussed with the patient, family and they are in agreement.  CODE STATUS: DNR  DVT Prophylaxis: Lovenox  TOTAL TIME TAKING CARE OF THIS PATIENT: *30* minutes.  >50% time spent on counselling and coordination of care  POSSIBLE D/C IN  fewDAYS, DEPENDING ON CLINICAL CONDITION.  Note: This dictation was prepared with Dragon dictation along with smaller phrase technology. Any transcriptional errors that result from this process are unintentional.  Fritzi Mandes M.D on 07/29/2017 at 2:06 PM  Between 7am to 6pm - Pager - [phone]  After 6pm go to [email or web address] - password [email or web address]  [facility]  Office  [phone]  CC: Primary care physician; Adin Hector, MDPatient ID: Cindee Salt, male   DOB: 1946/04/21, 72 y.o.   MRN: [id number]

## 2017-07-29 NOTE — Procedures (Signed)
Endotracheal Intubation: Patient required placement of an artificial airway secondary to Respiratory Failure  Consent: Emergent.   Hand washing performed prior to starting the procedure.   Medications administered for sedation prior to procedure:  Midazolam 4 mg IV,  Vecuronium 10 mg IV, Fentanyl 200 mcg IV.    A time out procedure was called and correct patient, name, & ID confirmed. Needed supplies and equipment were assembled and checked to include ETT, 10 ml syringe, Glidescope, Mac and Miller blades, suction, oxygen and bag mask valve, end tidal CO2 monitor.   Patient was positioned to align the mouth and pharynx to facilitate visualization of the glottis.   Heart rate, SpO2 and blood pressure was continuously monitored during the procedure. Pre-oxygenation was conducted prior to intubation and endotracheal tube was placed through the vocal cords into the trachea.     The artificial airway was placed under direct visualization via glidescope route using a 8.0  ETT on the first attempt.  ETT was secured at 23 cm mark.  Placement was confirmed by auscuitation of lungs with good breath sounds bilaterally and no stomach sounds.  Condensation was noted on endotracheal tube.   Pulse ox 93%.  CO2 detector in place with appropriate color change.   Complications: None .   Operator: Luvena Wentling.   Chest radiograph ordered and pending.    Corrin Parker, M.D.  [facility]  Medical Director [place] Director [facility] Cardio-Pulmonary Department

## 2017-07-30 ENCOUNTER — Inpatient Hospital Stay: Payer: Medicare Other

## 2017-07-30 DIAGNOSIS — J9601 Acute respiratory failure with hypoxia: Secondary | ICD-10-CM

## 2017-07-30 LAB — CBC WITH DIFFERENTIAL/PLATELET
BASOS ABS: 0 10*3/uL (ref 0–0.1)
Basophils Relative: 0 %
EOS ABS: 0 10*3/uL (ref 0–0.7)
EOS PCT: 0 %
HEMATOCRIT: 32.2 % — AB (ref 40.0–52.0)
Hemoglobin: 10.6 g/dL — ABNORMAL LOW (ref 13.0–18.0)
Lymphocytes Relative: 6 %
Lymphs Abs: 0.4 10*3/uL — ABNORMAL LOW (ref 1.0–3.6)
MCH: 30.2 pg (ref 26.0–34.0)
MCHC: 33 g/dL (ref 32.0–36.0)
MCV: 91.4 fL (ref 80.0–100.0)
MONO ABS: 0.3 10*3/uL (ref 0.2–1.0)
MONOS PCT: 4 %
Neutro Abs: 5.7 10*3/uL (ref 1.4–6.5)
Neutrophils Relative %: 90 %
PLATELETS: 142 10*3/uL — AB (ref 150–440)
RBC: 3.52 MIL/uL — ABNORMAL LOW (ref 4.40–5.90)
RDW: 15 % — AB (ref 11.5–14.5)
WBC: 6.3 10*3/uL (ref 3.8–10.6)

## 2017-07-30 LAB — PROTIME-INR
INR: 3.93
Prothrombin Time: 38.2 seconds — ABNORMAL HIGH (ref 11.4–15.2)

## 2017-07-30 LAB — BASIC METABOLIC PANEL
ANION GAP: 4 — AB (ref 5–15)
BUN: 25 mg/dL — AB (ref 6–20)
CALCIUM: 8.8 mg/dL — AB (ref 8.9–10.3)
CO2: 31 mmol/L (ref 22–32)
CREATININE: 0.61 mg/dL (ref 0.61–1.24)
Chloride: 105 mmol/L (ref 101–111)
GFR calc Af Amer: >60 mL/min (ref >60)
Glucose, Bld: 97 mg/dL (ref 65–99)
Potassium: 4.2 mmol/L (ref 3.5–5.1)
Sodium: 140 mmol/L (ref 135–145)

## 2017-07-30 LAB — BLOOD GAS, ARTERIAL
ACID-BASE EXCESS: 4.5 mmol/L — AB (ref 0.0–2.0)
BICARBONATE: 30.8 mmol/L — AB (ref 20.0–28.0)
FIO2: 0.4
O2 SAT: 91.6 %
PATIENT TEMPERATURE: 37
PEEP: 5 cmH2O
PO2 ART: 64 mmHg — AB (ref 83.0–108.0)
Pressure support: 5 cmH2O
pCO2 arterial: 52 mmHg — ABNORMAL HIGH (ref 32.0–48.0)
pH, Arterial: 7.38 (ref 7.350–7.450)

## 2017-07-30 LAB — PHOSPHORUS
PHOSPHORUS: 2.4 mg/dL — AB (ref 2.5–4.6)
PHOSPHORUS: 2.4 mg/dL — AB (ref 2.5–4.6)

## 2017-07-30 LAB — BRAIN NATRIURETIC PEPTIDE: B NATRIURETIC PEPTIDE 5: 495 pg/mL — AB (ref 0.0–100.0)

## 2017-07-30 LAB — GLUCOSE, CAPILLARY
GLUCOSE-CAPILLARY: 82 mg/dL (ref 65–99)
GLUCOSE-CAPILLARY: 126 mg/dL — AB (ref 65–99)
Glucose-Capillary: 96 mg/dL (ref 65–99)
Glucose-Capillary: 102 mg/dL — ABNORMAL HIGH (ref 65–99)

## 2017-07-30 LAB — HIV ANTIBODY (ROUTINE TESTING W REFLEX): HIV SCREEN 4TH GENERATION: NONREACTIVE

## 2017-07-30 LAB — MAGNESIUM
MAGNESIUM: 2.1 mg/dL (ref 1.7–2.4)
Magnesium: 1.9 mg/dL (ref 1.7–2.4)

## 2017-07-30 MED ORDER — CHLORHEXIDINE GLUCONATE 0.12% ORAL RINSE (MEDLINE KIT): 15.0000 mL | Freq: Two times a day (BID) | OROMUCOSAL | Status: DC

## 2017-07-30 MED ORDER — SODIUM CHLORIDE 0.9 % IV SOLN
5.0000 mg/h | INTRAVENOUS | Status: DC
  Administered 2017-07-30: 5 mg/h via INTRAVENOUS
  Filled 2017-07-30: qty 10

## 2017-07-30 MED ORDER — DOCUSATE SODIUM 50 MG/5ML PO LIQD
100.0000 mg | Freq: Two times a day (BID) | ORAL | Status: DC
  Administered 2017-07-30: 100 mg
  Filled 2017-07-30: qty 10

## 2017-07-30 MED ORDER — MORPHINE BOLUS VIA INFUSION
5.0000 mg | INTRAVENOUS | Status: DC | PRN
  Filled 2017-07-30: qty 20

## 2017-07-30 MED ORDER — HYDROCORTISONE NA SUCCINATE PF 100 MG IJ SOLR
50.0000 mg | Freq: Four times a day (QID) | INTRAMUSCULAR | Status: DC
  Administered 2017-07-30: 50 mg via INTRAVENOUS
  Filled 2017-07-30: qty 2

## 2017-07-30 MED ORDER — SENNOSIDES 8.8 MG/5ML PO SYRP
5.0000 mL | ORAL_SOLUTION | Freq: Two times a day (BID) | ORAL | Status: DC
  Filled 2017-07-30 (×2): qty 5

## 2017-07-30 MED ORDER — ORAL CARE MOUTH RINSE
15.0000 mL | Freq: Four times a day (QID) | OROMUCOSAL | Status: DC
  Administered 2017-07-30: 15 mL via OROMUCOSAL

## 2017-07-30 MED ORDER — POLYVINYL ALCOHOL 1.4 % OP SOLN
1.0000 [drp] | OPHTHALMIC | Status: DC | PRN
  Filled 2017-07-30: qty 15

## 2017-07-30 MED ORDER — GLYCOPYRROLATE 0.2 MG/ML IJ SOLN
0.2000 mg | INTRAMUSCULAR | Status: DC | PRN
Start: 2017-07-30 — End: 2017-07-31
  Administered 2017-07-30 – 2017-07-31 (×2): 0.2 mg via INTRAVENOUS
  Filled 2017-07-30 (×2): qty 1

## 2017-07-30 MED ORDER — MORPHINE SULFATE (PF) 2 MG/ML IV SOLN
2.0000 mg | INTRAVENOUS | Status: DC | PRN
  Administered 2017-07-30: 2 mg via INTRAVENOUS
  Filled 2017-07-30: qty 2

## 2017-07-30 MED ORDER — POTASSIUM & SODIUM PHOSPHATES 280-160-250 MG PO PACK
1.0000 | PACK | Freq: Three times a day (TID) | ORAL | Status: DC
  Administered 2017-07-30: 1
  Filled 2017-07-30 (×3): qty 1

## 2017-07-30 MED ORDER — MAGNESIUM SULFATE IN D5W 1-5 GM/100ML-% IV SOLN
1.0000 g | Freq: Once | INTRAVENOUS | Status: AC
  Administered 2017-07-30: 1 g via INTRAVENOUS
  Filled 2017-07-30: qty 100

## 2017-07-30 NOTE — Consult Note (Signed)
ANTICOAGULATION CONSULT NOTE - Initial Consult  Pharmacy Consult for Warfarin Dosing  Indication: DVT  Allergies  Allergen Reactions  . Other Other (See Comments)    Dizziness, lightheadedness, Pt states that inhaled medications make him depressed and have negative thoughts.    . Sulfa Antibiotics Rash    Headache   Patient Measurements: Height: 5\' 9"  (175.3 cm) Weight: 141 lb 1.5 oz (64 kg) IBW/kg (Calculated) : 70.7 Vital Signs: Temp: 97.5 F (36.4 C) (01/24 0400) Temp Source: Oral (01/24 0400) BP: 87/58 (01/24 0700) Pulse Rate: 92 (01/24 0700)  Labs: Recent Labs    07/27/2017 1810 07/29/17 0509 07/30/17 0640  HGB 8.2*  --   --   HCT 25.2*  --   --   PLT 100*  --   --   LABPROT  --  29.5* 38.2*  INR  --  2.83 3.93  CREATININE 0.73  --  0.61  TROPONINI 0.03*  --   --     Estimated Creatinine Clearance: 75.6 mL/min (by C-G formula based on SCr of 0.61 mg/dL).   Medical History: Past Medical History:  Diagnosis Date  . Anxiety   . Arthritis   . Barrett esophagus   . Basal cell carcinoma   . CAD (coronary artery disease)    on 2l home oxygen  . CHF (congestive heart failure) ([place])   . Chronic lower back pain   . COPD (chronic obstructive pulmonary disease) ([place])   . Depression   . DJD (degenerative joint disease)   . DVT (deep venous thrombosis) ([place]) 11/2013; 02/01/2015   on coumadin  . GERD (gastroesophageal reflux disease)   . 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 Heart attack ([place]) 03/20/1995  . History of gout   . Hyperlipidemia   . Hypertension   . Hypothyroidism   . Iron deficiency anemia   . Lumbar spinal stenosis   . Multiple pulmonary nodules   . On home oxygen therapy   . Parkinson's disease ([place])   . Recurrent aspiration pneumonia ([place])   . Seizures ([place])   . Sleep apnea   . Throat cancer ([place])   . Type II diabetes mellitus ([place])     Assessment: Pharmacy consulted for warfarin dosing and  monitoring in 73 yo male admitted with aspiration PNA. INR on admission therapeutic at 2.83. Patient was taking Warfarin 6mg  daliy PTA.   DATE INR DOSE 1/23 2.83 3mg  1/24 3.93 HOLD  Goal of Therapy:  INR 2-3 Monitor platelets by anticoagulation protocol: Yes   Plan:  INR supratherapeutic today. Patient was started on Unasyn for aspiration PNA, INR increase was expected  Will hold warfarin dose tonight and recheck INR with AM labs.  INR daily per protocol while antibiotics.   Pernell Dupre, PharmD, BCPS Clinical Pharmacist 07/30/2017 7:58 AM

## 2017-07-30 NOTE — Consult Note (Signed)
PHARMACY CONSULT NOTE - INITIAL   Pharmacy Consult for Electrolyte Monitoring and Replacement   Allergies  Allergen Reactions  . Other Other (See Comments)    Dizziness, lightheadedness, Pt states that inhaled medications make him depressed and have negative thoughts.    . Sulfa Antibiotics Rash    Headache   Patient Measurements: Height: 5\' 9"  (175.3 cm) Weight: 141 lb 1.5 oz (64 kg) IBW/kg (Calculated) : 70.7  Vital Signs: Temp: 98.1 F (36.7 C) (01/24 0800) Temp Source: Axillary (01/24 0800) BP: 88/60 (01/24 0900) Pulse Rate: 97 (01/24 0900) Intake/Output from previous day: 01/23 0701 - 01/24 0700 In: 1476.3 [I.V.:679.1; NG/GT:497.2; IV Piggyback:300] Out: 955 [Urine:955] Intake/Output from this shift: Total I/O In: 165.2 [I.V.:65.5; NG/GT:99.8] Out: -   Labs: Recent Labs    07/29/2017 1810 07/29/17 1317 07/29/17 1457 07/30/17 0640  WBC 6.0  --   --  6.3  HGB 8.2*  --   --  10.6*  HCT 25.2*  --   --  32.2*  PLT 100*  --   --  142*  CREATININE 0.73  --   --  0.61  MG  --  1.8 1.7 1.9  PHOS  --  3.0 2.8 2.4*   Potassium (mmol/L)  Date Value  07/30/2017 4.2  01/18/2014 4.7   Sodium (mmol/L)  Date Value  07/30/2017 140  01/18/2014 140   Magnesium (mg/dL)  Date Value  07/30/2017 1.9   Calcium (mg/dL)  Date Value  07/30/2017 8.8 (L)   Calcium, Total (mg/dL)  Date Value  01/18/2014 8.9   Albumin (g/dL)  Date Value  01/04/2016 3.7  01/17/2014 3.2 (L)   Phosphorus (mg/dL)  Date Value  07/30/2017 2.4 (L)  ] Estimated Creatinine Clearance: 75.6 mL/min (by C-G formula based on SCr of 0.61 mg/dL).  Assessment: Pharmacy consulted for electrolyte monitoring and replacement in 72 yo male with acute respiratory failure secondary to recurrent aspiration PNA.  Patient intubated on 1/23.   Goal of Therapy:  Electrolytes WNL Mg: >2 K+: >4  Plan:  1/23 Mg: 1.9 Phos 2.4. Will replace with 1g IV x 1 dose and PhosNak 1 packet x 3 doses.  Will follow up  with electrolytes with AM labs and continue to replace as needed.   Pernell Dupre, PharmD, BCPS Clinical Pharmacist 07/30/2017 11:53 AM

## 2017-07-30 NOTE — Progress Notes (Signed)
   07/30/17 1400  Clinical Encounter Type  Visited With Patient and family together  Visit Type Follow-up   Patient and family declined chaplain visit.  Chaplain reminded family of on-call chaplain availability and encouraged them to call if needed.

## 2017-07-30 NOTE — Progress Notes (Signed)
PULMONARY / CRITICAL CARE MEDICINE   Name: John Mendoza MRN: [id number] DOB: January 18, 1946    ADMISSION DATE:  07/22/2017 CONSULTATION DATE:  07/29/2017  REFERRING MD:  Dr. Duane Boston  CHIEF COMPLAINT:  Respiratory distress  HISTORY OF PRESENT ILLNESS:   72 year-old male with extensive PMH as below presented to the ED last night with cough, dyspnea, and chest pain x2 days. CXR suspicious for RLL infiltrate, CT-A negative for PE. Pt has a g-tube for nutrition. Pt was admitted for treatment of aspiration PNA.   This morning, the pt acutely decompensated with increasing WOB and tachycardia to the 180s. A rapid response was called and the pt was ultimately brought to the ICU. He was intubated on arrival and became hypotensive requiring pressors.   Of note, the pt is highly dependent at baseline and has a number of medical problems any of which confer a very poor prognosis and quality of life. Aspiration PNA has been a recurrent issue for him and will likely continue to be.    SIGNIFICANT EVENTS: 1/22 Admitted to floor with cough, dyspnea and CXR findings c/w aspiration PNA 1/23 While on floor, pt acutely decompensated with respiratory distress and tachycardia to the 180s. Rapid response called, pt transferred to ICU 1/23 On arrival in the ICU, pt in respiratory failure and shock. Pt intubated, subsequently developed hypotension and started on neo-synephrine 1/23 Discussed goals of care with family, code status changed to DNR.  1/24 Pt responsive to voice, following commands, denies pain/discomfort  REVIEW OF SYSTEMS:   Not performed 2/2 pt condition requiring critical intervention  SUBJECTIVE:  Critically ill appearing cachetic male lying in bed, intubated, sedated, on mechanical ventilation in no obvious distress Patient placed on PS mode Family at bedside Plan for trial of extubation  I have explained to family that once we extubate and he fails-we will proceed with comfort care measures if  needed The family has agreed and consented to this plan VITAL SIGNS: BP 114/73 (BP Location: Left Arm)   Pulse 98   Temp 98.1 F (36.7 C) (Axillary)   Resp 14   Ht 5\' 9"  (1.753 m)   Wt 64 kg (141 lb 1.5 oz)   SpO2 100%   BMI 20.84 kg/m    VENTILATOR SETTINGS: Vent Mode: PRVC FiO2 (%):  [40 %-100 %] 40 % Set Rate:  [16 bmp] 16 bmp Vt Set:  [500 mL] 500 mL PEEP:  [5 cmH20-50 cmH20] 5 cmH20 Plateau Pressure:  [16 cmH20-22 cmH20] 22 cmH20  INTAKE / OUTPUT: I/O last 3 completed shifts: In: 1887.3 [I.V.:940.1; NG/GT:497.2; IV Piggyback:450] Out: 1751 [Urine:1105]  PHYSICAL EXAMINATION: General:  Critically ill-appearing Neuro:  Intubated, on sedation but responsive to voice GCS 10T RASS -1 HEENT:  NCAT Cardiovascular:  S1 S2, IRR, no m/g/r. No LE edema. Lungs: Mild coarse rhonchi, somewhat decreased air movement Abdomen:  Soft non-distended Musculoskeletal:  No gross deformity Skin:  Warm dry acyanotic  LABS:  BMET Recent Labs  Lab 07/26/2017 1810 07/30/17 0640  NA 138 140  K 4.6 4.2  CL 97* 105  CO2 34* 31  BUN 24* 25*  CREATININE 0.73 0.61  GLUCOSE 112* 97    Electrolytes Recent Labs  Lab 07/27/2017 1810 07/29/17 1317 07/29/17 1457 07/30/17 0640  CALCIUM 9.3  --   --  8.8*  MG  --  1.8 1.7 1.9  PHOS  --  3.0 2.8 2.4*    CBC Recent Labs  Lab 07/30/2017 1810 07/30/17 0640  WBC 6.0  6.3  HGB 8.2* 10.6*  HCT 25.2* 32.2*  PLT 100* 142*    Coag's Recent Labs  Lab 07/29/17 0509 07/30/17 0640  INR 2.83 3.93    Sepsis Markers Recent Labs  Lab 08/05/2017 2325 07/29/17 0243  LATICACIDVEN 0.6 0.9    ABG Recent Labs  Lab 07/29/17 1044  PHART 7.33*  PCO2ART 58*  PO2ART 109*    Liver Enzymes No results for input(s): AST, ALT, ALKPHOS, BILITOT, ALBUMIN in the last 168 hours.  Cardiac Enzymes Recent Labs  Lab 07/25/2017 1810  TROPONINI 0.03*    Glucose Recent Labs  Lab 07/29/17 1647 07/29/17 1936 07/29/17 2002 07/29/17 2325  07/30/17 0411 07/30/17 0714  GLUCAP 76 66 102* 91 82 96    Imaging Dg Chest Port 1 View  Result Date: 07/29/2017 CLINICAL DATA:  Intubation EXAM: PORTABLE CHEST 1 VIEW COMPARISON:  CTA chest dated 07/27/2017 FINDINGS: Endotracheal tube terminates 4.5 cm above the carina. Pulmonary vascular congestion with suspected mild interstitial edema. Patchy right lower lobe opacity, progressed from recent CT, suspicious for pneumonia. Left lower lobe opacity, at least some of which likely reflects chronic atelectasis/scarring, with associated small left pleural effusion. The heart is normal in size. Leftward cardiomediastinal shift. Postsurgical changes related to prior CABG. Median sternotomy. IMPRESSION: Endotracheal tube terminates 4.5 cm above the carina. Suspected mild interstitial edema with small left pleural effusion. Patchy right lower lobe opacity, progressed from recent CT, suspicious for pneumonia. Left lower lobe opacity, at least some of which may reflect chronic atelectasis/scarring. Electronically Signed   By: Julian Hy M.D.   On: 07/29/2017 10:29   I have Independently reviewed images of  CXR   on 07/30/2017 Interpretation: R lung opacity   STUDIES:  01/23 CXR ETT 4.5 cm above carina, opacity in RLL increased from previous likely aspiration PNA, small left pleural effusion  CULTURES: BC pending  ANTIBIOTICS: Vancomycin 1/22>>1/23 Zosyn 1/22>>1/23 Unasyn 1/23 >>  LINES/TUBES: 8.0 ETT 1/23 >>  DISCUSSION: This is a 72 year-old male pt with many chronically debilitating health problems who is dependent at baseline and likely has a poor quality of life. He is acutely ill with aspiration PNA and respiratory failure. Given his prognosis, he would likely most benefit from palliative care. After initial discussion, pt's family changed code status to DNR. Palliative care following. Need to clarify goals for care particularly regarding extubation.      ASSESSMENT / PLAN: 72  year-old male with acute and severe hypoxic respiratory failure 2/2 recurrent aspiration PNA as well as a number of chronic medical conditions.   PULMONARY A: Acute hypoxic/hypercarbic respiratory failure 2/2 aspiration PNA, improving Acute respiratory acidosis 2/2 hypoventilation P:   Intubated Continue mechanical ventilation Unasyn for PNA Plan for trial of extubation  CARDIOVASCULAR A:  Pleural effusion with history of CAD and CHF Elevated troponin (0.03) Hypotension P:  Check BNP Follow troponin Continue neo-synephrine  RENAL A: No issues   P:   Follow BMP Electrolyte replacement as needed  GASTROINTESTINAL A:   History of dysphagia, dependent on g-tube for nutrition P:   Feeds per dietary  HEMATOLOGIC A:   Mild anemia in the setting of chronic disease P:  Follow CBC DVT prophylaxis   INFECTIOUS A:   Aspiration PNA, lactic acid not elevated P:   Continue Unasyn Follow cultures  ENDOCRINE A:   No issues   P:   CBG monitoring  NEUROLOGIC A:   Sedated on mechanical ventilation P:   Continue sedation  RASS goal: -2 - -  3   Critical Care Time devoted to patient care services described in this note is 45 minutes.   Overall, patient is critically ill, prognosis is guarded.  Patient with Multiorgan failure and at high risk for cardiac arrest and death.   Plan for comfort care measures if patient decompensates after extubation   Keera Altidor Patricia Pesa, M.D.  [facility]  Medical Director [place] Director [facility] Cardio-Pulmonary Department

## 2017-07-30 NOTE — Progress Notes (Signed)
Patient resting comfortably in bed with multiple family members at bedside. Morphine gtt infusing. Patient states he is comfortable. Will continue to monitor.

## 2017-07-30 NOTE — Progress Notes (Signed)
[facility] [place] NAME: John Mendoza    MR#:  [id number]  DATE OF BIRTH:  January 07, 1946  SUBJECTIVE:  Patient more awake and alert. Eating on holdExtubated this morning. Looks w given residuals REVIEW OF ALERT.  SYSTEMS:   Review of Systems  Unable to perform ROS: Intubated  Constitutional: Negative for chills, fever and weight loss.  HENT: Negative for ear discharge, ear pain and nosebleeds.   Eyes: Negative for blurred vision, pain and discharge.  Respiratory: Positive for shortness of breath. Negative for sputum production, wheezing and stridor.   Cardiovascular: Negative for chest pain, palpitations, orthopnea and PND.  Gastrointestinal: Negative for abdominal pain, diarrhea, nausea and vomiting.  Genitourinary: Negative for frequency and urgency.  Musculoskeletal: Negative for back pain and joint pain.  Neurological: Positive for weakness. Negative for sensory change, speech change and focal weakness.  Psychiatric/Behavioral: Negative for depression and hallucinations. The patient is not nervous/anxious.    DRUG ALLERGIES:   Allergies  Allergen Reactions  . Other Other (See Comments)    Dizziness, lightheadedness, Pt states that inhaled medications make him depressed and have negative thoughts.    . Sulfa Antibiotics Rash    Headache    VITALS:  Blood pressure 98/76, pulse 98, temperature 98.1 F (36.7 C), temperature source Axillary, resp. rate 14, height 5\' 9"  (1.753 m), weight 64 kg (141 lb 1.5 oz), SpO2 93 %.  PHYSICAL EXAMINATION:   Physical Exam  GENERAL:  72 y.o.-year-old patient lying in the bed with  Acute respiratory distress.  Critically ill EYES: Pupils equal, round, reactive to light and accommodation. No scleral icterus. Extraocular muscles intact.  HEENT: Head atraumatic, normocephalic. Oropharynx and nasopharynx clear.  Intubated on the ventilator NECK:  Supple, no jugular venous distention. No thyroid  enlargement, no tenderness.  LUNGS: Normal breath sounds bilaterally, no wheezing, rales, rhonchi. No use of accessory muscles of respiration.  CARDIOVASCULAR: S1, S2 normal. No murmurs, rubs, or gallops.  Tachycardia ABDOMEN: Soft, nontender, nondistended. Bowel sounds present. No organomegaly or mass. PEG tube++ EXTREMITIES: No cyanosis, clubbing or edema b/l.    NEUROLOGIC: Bilateral lower extremity weakness. Cranial nerves appears normal   Has rest tremors. PSYCHIATRIC: alert  And oriented x3 SKIN: No obvious rash, lesion, or ulcer.   LABORATORY PANEL:  CBC Recent Labs  Lab 07/30/17 0640  WBC 6.3  HGB 10.6*  HCT 32.2*  PLT 142*    Chemistries  Recent Labs  Lab 07/30/17 0640  NA 140  K 4.2  CL 105  CO2 31  GLUCOSE 97  BUN 25*  CREATININE 0.61  CALCIUM 8.8*  MG 1.9   Cardiac Enzymes Recent Labs  Lab 07/18/2017 1810  TROPONINI 0.03*   RADIOLOGY:  Dg Chest 2 View  Result Date: 07/07/2017 CLINICAL DATA:  Cough and dyspnea with intermittent chest pain. EXAM: CHEST  2 VIEW COMPARISON:  10/26/2016 FINDINGS: Stable cardiomegaly with aortic atherosclerosis and post CABG change. Small left pleural effusion blunting the posterior costophrenic angle with associated compressive and subsegmental atelectasis. No overt pulmonary edema. Minimal scarring in the left upper lobe. No acute nor suspicious osseous abnormality. Thoracic spondylosis. IMPRESSION: 1. Stable cardiomegaly with aortic atherosclerosis and post CABG change. 2. Small left pleural effusion with atelectasis. Electronically Signed   By: Ashley Royalty M.D.   On: 07/30/2017 18:58   Ct Angio Chest Pe W And/or Wo Contrast  Result Date: 07/24/2017 CLINICAL DATA:  72 year old with cough and shortness of breath. EXAM: CT  ANGIOGRAPHY CHEST WITH CONTRAST TECHNIQUE: Multidetector CT imaging of the chest was performed using the standard protocol during bolus administration of intravenous contrast. Multiplanar CT image reconstructions  and MIPs were obtained to evaluate the vascular anatomy. CONTRAST:  64mL ISOVUE-370 IOPAMIDOL (ISOVUE-370) INJECTION 76% COMPARISON:  Chest CT 01/12/2016.  Chest radiograph 07/28/2016 FINDINGS: Cardiovascular: No evidence for a large or central pulmonary embolism. Poor opacification of the segmental branches in the left lower lobe but pulmonary embolism in this area is thought to be unlikely. Atherosclerotic calcifications in the thoracic aorta. Coronary arteries are heavily calcified. Post CABG procedure. Heart is enlarged without significant pericardial fluid. Mediastinum/Nodes: Few prominent lymph nodes in the mediastinum appear unchanged. No significant axillary lymphadenopathy. No significant hilar lymphadenopathy. Lungs/Pleura: Small left pleural effusion. Centrilobular emphysema. Trachea is patent but there is occlusion or narrowing of the distal left mainstem bronchus. Right upper lobe bronchus is patent but there are endobronchial filling defects within the bronchus intermedius extending into the right lobe and right middle lobe. There is chronic consolidation in the left lower lobe with a pocket of gas or air-filled lung in the posterior left lung base. This pocket of gas has slightly enlarged, measuring up to 4.8 cm and previously measuring 2.9 cm. These findings have not significantly changed since 2017. Patchy parenchymal densities in the left upper lobe. Patchy parenchymal disease the right lower lobe. Scattered small nodules in the right lower lobe and right middle lobe. Upper Abdomen: Evidence for a balloon retention gastrostomy tube which is partially visualized. Otherwise, images of the upper abdomen are unremarkable. Large amount artifact in the upper abdomen related to patient's arms. Musculoskeletal: No acute bone abnormality. Review of the MIP images confirms the above findings. IMPRESSION: No evidence for a large or central pulmonary embolism. Limited evaluation of the left lower lobe as  described but pulmonary embolism in this area is thought to be unlikely. Endobronchial filling defects within the bronchus intermedius extending into the right middle lobe and right lower lobe. Findings raise concern for aspiration and/or mucous plugging. There is also marked narrowing or blockage in the distal left mainstem bronchus. Patchy parenchymal and nodular densities in both lungs. Findings are suggestive for infection and/or aspiration. Consider 3 month follow-up to ensure that these nodular densities are infectious or inflammatory. Chronic consolidation in the left lower lobe with an enlarging segment of gas or air-filled lung at the left lung base. This may represent trapped air within a small segment of lung. Electronically Signed   By: Markus Daft M.D.   On: 07/20/2017 22:51   Dg Chest Port 1 View  Result Date: 07/30/2017 CLINICAL DATA:  Increasing shortness of breath, intubation, cough EXAM: PORTABLE CHEST 1 VIEW COMPARISON:  Portable chest x-ray of 07/29/2017 and CT chest of 07/30/2017 FINDINGS: The tip of the endotracheal tube is approximately 5.8 cm above the carina. The opacity of the right hilum and right lung base has improved slightly, but opacity remains most consistent with pneumonia. There is some opacity at the left lung base as well which may be due to atelectasis, pneumonia, and pleural effusion. Cardiomegaly is stable. Median sternotomy sutures are noted. IMPRESSION: 1. Some improvement in opacities particularly at the right lung base may indicate slight improvement in probable bibasilar pneumonia. 2. Left pleural effusion remains. 3. Tip of endotracheal tube 5.8 cm above the carina. Electronically Signed   By: Ivar Drape M.D.   On: 07/30/2017 09:18   Dg Chest Port 1 View  Result Date: 07/29/2017 CLINICAL DATA:  Intubation EXAM: PORTABLE CHEST 1 VIEW COMPARISON:  CTA chest dated 08/02/2017 FINDINGS: Endotracheal tube terminates 4.5 cm above the carina. Pulmonary vascular  congestion with suspected mild interstitial edema. Patchy right lower lobe opacity, progressed from recent CT, suspicious for pneumonia. Left lower lobe opacity, at least some of which likely reflects chronic atelectasis/scarring, with associated small left pleural effusion. The heart is normal in size. Leftward cardiomediastinal shift. Postsurgical changes related to prior CABG. Median sternotomy. IMPRESSION: Endotracheal tube terminates 4.5 cm above the carina. Suspected mild interstitial edema with small left pleural effusion. Patchy right lower lobe opacity, progressed from recent CT, suspicious for pneumonia. Left lower lobe opacity, at least some of which may reflect chronic atelectasis/scarring. Electronically Signed   By: Julian Hy M.D.   On: 07/29/2017 10:29   ASSESSMENT AND PLAN:   Ianmichael Amescua  is a 72 y.o. male with extensive medical history, including COPD on 2 L continuous home oxygen, coronary artery disease, CHF, Barrett's esophagus, on G-tube feeds, Parkinson's disease, Goodpasture's syndrome, throat cancer and recurrent aspiration pneumonias.   Patient was brought to emergency room via EMS for acute onset of shortness of breath and productive cough going on for the past 2 days.  He also requires more oxygen, 3 L per nasal cannula to maintain oxygen saturation at 93%  1.  Acute hypoxemic respiratory failure, secondary to recurrent aspiration pneumonia.     -pt extubated today - patient was on Zosyn and vancomycin--IV unasyn - add duo nebs and oxygen therapy as needed.   -CT chest shows worsening pneumonia with possible mucous plugging  2.  Recurrent aspiration pneumonia, see treatment above under #1.  3.  Chronic dysphagia, secondary to Barrett's esophagus. - Continue feeding by G-tube when appropriate  4.  Coronary artery disease, stable continue medical treatment .  5.  CHF, currently well compensated, continue medical management.  6.  Parkinson's disease, clinically  worsening, due to acute illness. -  We will restart home medications thru g-tube  7.  Functional paraparesis, chronic.  Continue conservative management and support Patient is bedbound.  He is being taken care of by wife at home  Wife understands patient has a long term poor prognosis  Case discussed with Care Management/Social Worker. Management plans discussed with the patient, family and they are in agreement.  CODE STATUS: DNR  DVT Prophylaxis: Lovenox  TOTAL TIME TAKING CARE OF THIS PATIENT: *30* minutes.  >50% time spent on counselling and coordination of care  POSSIBLE D/C IN  Few DAYS, DEPENDING ON CLINICAL CONDITION.  Note: This dictation was prepared with Dragon dictation along with smaller phrase technology. Any transcriptional errors that result from this process are unintentional.  Fritzi Mandes M.D on 07/30/2017 at 3:37 PM  Between 7am to 6pm - Pager - [phone]  After 6pm go to [email or web address] - password [email or web address]  [facility]  Office  [phone]  CC: Primary care physician; Adin Hector, MDPatient ID: Cindee Salt, male   DOB: Aug 11, 1945, 72 y.o.   MRN: [id number]

## 2017-07-30 NOTE — Progress Notes (Addendum)
11:00 Pt's tube feeds flow out continuously when cap is taken off. Tube feeds have been held per MD. Neo at 62mcg/hr and being weaned down gradually.  12:00 Pt extubated this morning. Now on 4L [place]. Pt resting comfortably.   14:00: Pt g-tube checked, tube feeds still flowing profusely out. Per recommendation by dietician, stomach contents drained out of G-tube. 776mL total.

## 2017-07-30 NOTE — Progress Notes (Signed)
   07/30/17 1115  Clinical Encounter Type  Visited With Patient and family together  Visit Type Initial   Introductory chaplain visit attempted.  Family requested that chaplain return this afternoon.

## 2017-07-30 NOTE — Progress Notes (Signed)
Patient extubated and placed on 4lpm [place].  Cuff leak was verified before extubation.  Patient tolerated well.  HR 110 oxygen saturations 92%.

## 2017-07-30 NOTE — Progress Notes (Signed)
Called to evaluate patient for SPO2 in the 80s. Placed on non-rebreather with marginal improvement. Patient has lots of secretions. His family are at bedside and indicate that they do not want anymore aggressive measures. Reviewed comfort care orders with them and they are ready to proceed with full comfort care. Nurse instructed to change patient to nasal cannula and give 2 mg of morphine for air hunger. Withdrawal of life sustaining care orders placed. Support offered to the family and spiritual care consulted.  Will continue to monitor and treat to ensure patient is in no distress Plan of care reviewed with staff  Muriel Wilber S. [facility] ANP-BC Pulmonary and Critical Care Medicine [facility] Pager [phone] or [phone]  NB: This document was prepared using Dragon voice recognition software and may include unintentional dictation errors.

## 2017-08-03 LAB — CULTURE, BLOOD (ROUTINE X 2)
CULTURE: NO GROWTH
Culture: NO GROWTH
Special Requests: ADEQUATE
Special Requests: ADEQUATE

## 2017-08-04 ENCOUNTER — Telehealth: Payer: Self-pay | Admitting: Internal Medicine

## 2017-08-04 NOTE — Telephone Encounter (Signed)
Death cert placed in DK's folder to be signed. 

## 2017-08-04 NOTE — Telephone Encounter (Signed)
[employer] dropped off Death Certificate to be signed °Placed in Nurse Box °

## 2017-08-06 NOTE — Telephone Encounter (Signed)
Informed [employer] death cert is ready for pick up. 

## 2017-08-07 NOTE — Death Summary Note (Signed)
DEATH SUMMARY   Patient Details  Name: John Mendoza MRN: [id number] DOB: 1946/02/27  Admission/Discharge Information   Admit Date:  08-22-2017  Date of Death: Date of Death: (P) 2017/08/25  Time of Death: Time of Death: (P) 0635  Length of Stay: 2  Referring Physician: Adin Hector, MD   Reason(s) for Hospitalization  Shortness of Breath  Diagnoses  Preliminary cause of death: Pneumonia Secondary Diagnoses (including complications and co-morbidities):  Active Problems:   Aspiration pneumonia (HCC)  Chronic Dysphagia secondary to Barrett's Esophagus dependent on G-tube for Nutrition   Coronary Artery Disease  Chronic Systolic CHF  Type II Diabetes Mellitus   Essential Hypertension   Parkinson's Disease  Chronic Functional Paraparesis   Severe Malnutrition   Brief Hospital Course (including significant findings, care, treatment, and services provided and events leading to death)  John Mendoza is a 72 y.o. year old male who presented to [facility] ER 08/22/2017 with acute respiratory failure and chest pain secondary to recurrent aspiration pneumonia, therefore he was subsequently admitted to the [facility] unit for further workup and treatment.  On 07/29/17 he required transfer to ICU due to decline in respiratory status requiring mechanical intubation.  On 07/29/17 goals of care discussed with pts family who stated at baseline the pt is highly dependent and had recurrent episodes of aspiration pneumonia.  Therefore, pts code status changed to DNR with plans for a trial of extubation and if the pt did not tolerate extubation the pt would transition to comfort measures only.  On 07/30/17 the pt was extubated, however his respiratory status declined and pt transitioned to comfort measures only.  He expired on 08-25-17 at 0635 am.  Pertinent Labs and Studies  Significant Diagnostic Studies Dg Chest 2 View  Result Date: 08/22/17 CLINICAL DATA:  Cough and dyspnea with intermittent chest  pain. EXAM: CHEST  2 VIEW COMPARISON:  10/26/2016 FINDINGS: Stable cardiomegaly with aortic atherosclerosis and post CABG change. Small left pleural effusion blunting the posterior costophrenic angle with associated compressive and subsegmental atelectasis. No overt pulmonary edema. Minimal scarring in the left upper lobe. No acute nor suspicious osseous abnormality. Thoracic spondylosis. IMPRESSION: 1. Stable cardiomegaly with aortic atherosclerosis and post CABG change. 2. Small left pleural effusion with atelectasis. Electronically Signed   By: Ashley Royalty M.D.   On: 2017-08-22 18:58   Ct Angio Chest Pe W And/or Wo Contrast  Result Date: 2017/08/22 CLINICAL DATA:  72 year old with cough and shortness of breath. EXAM: CT ANGIOGRAPHY CHEST WITH CONTRAST TECHNIQUE: Multidetector CT imaging of the chest was performed using the standard protocol during bolus administration of intravenous contrast. Multiplanar CT image reconstructions and MIPs were obtained to evaluate the vascular anatomy. CONTRAST:  32mL ISOVUE-370 IOPAMIDOL (ISOVUE-370) INJECTION 76% COMPARISON:  Chest CT 01/12/2016.  Chest radiograph 08-22-2016 FINDINGS: Cardiovascular: No evidence for a large or central pulmonary embolism. Poor opacification of the segmental branches in the left lower lobe but pulmonary embolism in this area is thought to be unlikely. Atherosclerotic calcifications in the thoracic aorta. Coronary arteries are heavily calcified. Post CABG procedure. Heart is enlarged without significant pericardial fluid. Mediastinum/Nodes: Few prominent lymph nodes in the mediastinum appear unchanged. No significant axillary lymphadenopathy. No significant hilar lymphadenopathy. Lungs/Pleura: Small left pleural effusion. Centrilobular emphysema. Trachea is patent but there is occlusion or narrowing of the distal left mainstem bronchus. Right upper lobe bronchus is patent but there are endobronchial filling defects within the bronchus  intermedius extending into the right lobe and right middle  lobe. There is chronic consolidation in the left lower lobe with a pocket of gas or air-filled lung in the posterior left lung base. This pocket of gas has slightly enlarged, measuring up to 4.8 cm and previously measuring 2.9 cm. These findings have not significantly changed since 2017. Patchy parenchymal densities in the left upper lobe. Patchy parenchymal disease the right lower lobe. Scattered small nodules in the right lower lobe and right middle lobe. Upper Abdomen: Evidence for a balloon retention gastrostomy tube which is partially visualized. Otherwise, images of the upper abdomen are unremarkable. Large amount artifact in the upper abdomen related to patient's arms. Musculoskeletal: No acute bone abnormality. Review of the MIP images confirms the above findings. IMPRESSION: No evidence for a large or central pulmonary embolism. Limited evaluation of the left lower lobe as described but pulmonary embolism in this area is thought to be unlikely. Endobronchial filling defects within the bronchus intermedius extending into the right middle lobe and right lower lobe. Findings raise concern for aspiration and/or mucous plugging. There is also marked narrowing or blockage in the distal left mainstem bronchus. Patchy parenchymal and nodular densities in both lungs. Findings are suggestive for infection and/or aspiration. Consider 3 month follow-up to ensure that these nodular densities are infectious or inflammatory. Chronic consolidation in the left lower lobe with an enlarging segment of gas or air-filled lung at the left lung base. This may represent trapped air within a small segment of lung. Electronically Signed   By: Markus Daft M.D.   On: 07/10/2017 22:51   Dg Chest Port 1 View  Result Date: 07/30/2017 CLINICAL DATA:  Increasing shortness of breath, intubation, cough EXAM: PORTABLE CHEST 1 VIEW COMPARISON:  Portable chest x-ray of 07/29/2017 and  CT chest of 07/24/2017 FINDINGS: The tip of the endotracheal tube is approximately 5.8 cm above the carina. The opacity of the right hilum and right lung base has improved slightly, but opacity remains most consistent with pneumonia. There is some opacity at the left lung base as well which may be due to atelectasis, pneumonia, and pleural effusion. Cardiomegaly is stable. Median sternotomy sutures are noted. IMPRESSION: 1. Some improvement in opacities particularly at the right lung base may indicate slight improvement in probable bibasilar pneumonia. 2. Left pleural effusion remains. 3. Tip of endotracheal tube 5.8 cm above the carina. Electronically Signed   By: Ivar Drape M.D.   On: 07/30/2017 09:18   Dg Chest Port 1 View  Result Date: 07/29/2017 CLINICAL DATA:  Intubation EXAM: PORTABLE CHEST 1 VIEW COMPARISON:  CTA chest dated 07/30/2017 FINDINGS: Endotracheal tube terminates 4.5 cm above the carina. Pulmonary vascular congestion with suspected mild interstitial edema. Patchy right lower lobe opacity, progressed from recent CT, suspicious for pneumonia. Left lower lobe opacity, at least some of which likely reflects chronic atelectasis/scarring, with associated small left pleural effusion. The heart is normal in size. Leftward cardiomediastinal shift. Postsurgical changes related to prior CABG. Median sternotomy. IMPRESSION: Endotracheal tube terminates 4.5 cm above the carina. Suspected mild interstitial edema with small left pleural effusion. Patchy right lower lobe opacity, progressed from recent CT, suspicious for pneumonia. Left lower lobe opacity, at least some of which may reflect chronic atelectasis/scarring. Electronically Signed   By: Julian Hy M.D.   On: 07/29/2017 10:29    Microbiology Recent Results (from the past 240 hour(s))  Blood culture (routine x 2)     Status: None (Preliminary result)   Collection Time: 07/16/2017 11:35 PM  Result Value Ref Range  Status   Specimen  Description BLOOD LT [facility]  Final   Special Requests   Final    BOTTLES DRAWN AEROBIC AND ANAEROBIC Blood Culture adequate volume   Culture   Final    NO GROWTH 2 DAYS Performed at [facility], [address] [place], [place] [address]    Report Status PENDING  Incomplete  Blood culture (routine x 2)     Status: None (Preliminary result)   Collection Time: 07/21/2017 11:38 PM  Result Value Ref Range Status   Specimen Description BLOOD LT [facility]  Final   Special Requests   Final    BOTTLES DRAWN AEROBIC AND ANAEROBIC Blood Culture adequate volume   Culture   Final    NO GROWTH 2 DAYS Performed at [facility], [address] [place], [place] [address]    Report Status PENDING  Incomplete  MRSA PCR Screening     Status: None   Collection Time: 07/29/17 12:39 PM  Result Value Ref Range Status   MRSA by PCR NEGATIVE NEGATIVE Final    Comment:        The GeneXpert MRSA Assay (FDA approved for NASAL specimens only), is one component of a comprehensive MRSA colonization surveillance program. It is not intended to diagnose MRSA infection nor to guide or monitor treatment for MRSA infections. Performed at [facility], [address] [place], [place] [address]     Lab Basic Metabolic Panel: Recent Labs  Lab 07/14/2017 1810 07/29/17 1317 07/29/17 1457 07/30/17 0640 07/30/17 1720  NA 138  --   --  140  --   K 4.6  --   --  4.2  --   CL 97*  --   --  105  --   CO2 34*  --   --  31  --   GLUCOSE 112*  --   --  97  --   BUN 24*  --   --  25*  --   CREATININE 0.73  --   --  0.61  --   CALCIUM 9.3  --   --  8.8*  --   MG  --  1.8 1.7 1.9 2.1  PHOS  --  3.0 2.8 2.4* 2.4*   Liver Function Tests: No results for input(s): AST, ALT, ALKPHOS, BILITOT, PROT, ALBUMIN in the last 168 hours. No results for input(s): LIPASE, AMYLASE in the last 168 hours. No results for input(s): AMMONIA in the last 168 hours. CBC: Recent Labs  Lab 07/20/2017 1810  07/30/17 0640  WBC 6.0 6.3  NEUTROABS 5.4 5.7  HGB 8.2* 10.6*  HCT 25.2* 32.2*  MCV 92.6 91.4  PLT 100* 142*   Cardiac Enzymes: Recent Labs  Lab 07/27/2017 1810  TROPONINI 0.03*   Sepsis Labs: Recent Labs  Lab 07/14/2017 1810 07/20/2017 2325 07/29/17 0243 07/30/17 0640  WBC 6.0  --   --  6.3  LATICACIDVEN  --  0.6 0.9  --     Procedures/Operations  Mechanical Intubation   Marda Stalker, [place] Pager [phone] (please enter 7 digits) PCCM Consult Pager [phone] (please enter 7 digits)

## 2017-08-07 NOTE — Progress Notes (Signed)
Patient's wife called and updated. Patients HR 160s, O2 sat ranging from 60 to 30%. Patient's wife returning to hospital.

## 2017-08-07 DEATH — deceased

## 2017-09-04 DEATH — deceased
